# Patient Record
Sex: Male | Born: 1965 | Race: White | Hispanic: No | Marital: Married | Smoking: Former smoker
Health system: Southern US, Community
[De-identification: ages and names within clinical notes are randomized; demographics above are authoritative.]

## PROBLEM LIST (undated history)

## (undated) DIAGNOSIS — F32A Depression, unspecified: Secondary | ICD-10-CM

## (undated) DIAGNOSIS — I2510 Atherosclerotic heart disease of native coronary artery without angina pectoris: Secondary | ICD-10-CM

## (undated) DIAGNOSIS — F419 Anxiety disorder, unspecified: Secondary | ICD-10-CM

## (undated) DIAGNOSIS — I10 Essential (primary) hypertension: Secondary | ICD-10-CM

## (undated) HISTORY — DX: Anxiety disorder, unspecified: F41.9

## (undated) HISTORY — DX: Essential (primary) hypertension: I10

## (undated) HISTORY — DX: Depression, unspecified: F32.A

---

## 1987-07-16 HISTORY — PX: EYE SURGERY: SHX253

## 2005-05-03 ENCOUNTER — Emergency Department (HOSPITAL_COMMUNITY): Admission: EM | Admit: 2005-05-03 | Discharge: 2005-05-03 | Payer: Self-pay | Admitting: Emergency Medicine

## 2005-05-03 ENCOUNTER — Inpatient Hospital Stay (HOSPITAL_COMMUNITY): Admission: RE | Admit: 2005-05-03 | Discharge: 2005-05-07 | Payer: Self-pay | Admitting: Psychiatry

## 2005-05-04 ENCOUNTER — Ambulatory Visit: Payer: Self-pay | Admitting: Psychiatry

## 2006-08-26 ENCOUNTER — Emergency Department (HOSPITAL_COMMUNITY): Admission: EM | Admit: 2006-08-26 | Discharge: 2006-08-26 | Payer: Self-pay | Admitting: Emergency Medicine

## 2006-08-26 ENCOUNTER — Inpatient Hospital Stay (HOSPITAL_COMMUNITY): Admission: AD | Admit: 2006-08-26 | Discharge: 2006-08-30 | Payer: Self-pay | Admitting: Psychiatry

## 2006-08-26 ENCOUNTER — Ambulatory Visit: Payer: Self-pay | Admitting: Psychiatry

## 2006-09-14 ENCOUNTER — Emergency Department (HOSPITAL_COMMUNITY): Admission: EM | Admit: 2006-09-14 | Discharge: 2006-09-14 | Payer: Self-pay | Admitting: Emergency Medicine

## 2006-09-16 ENCOUNTER — Inpatient Hospital Stay (HOSPITAL_COMMUNITY): Admission: EM | Admit: 2006-09-16 | Discharge: 2006-09-22 | Payer: Self-pay | Admitting: Emergency Medicine

## 2006-09-19 ENCOUNTER — Encounter: Payer: Self-pay | Admitting: *Deleted

## 2008-10-18 IMAGING — CR DG CHEST 1V PORT
1 series · 1 of 1 positions shown · non-contrast
Comparison: 09/18/06 at [DATE] a.m. and 09/16/06.

CLINICAL DATA: Altered mental status with metabolic acidosis.
 AP PORTABLE SEMI-ERECT CHEST - 1 VIEW ? 09/19/06 AT [DATE] A.M.:

[view not recorded]
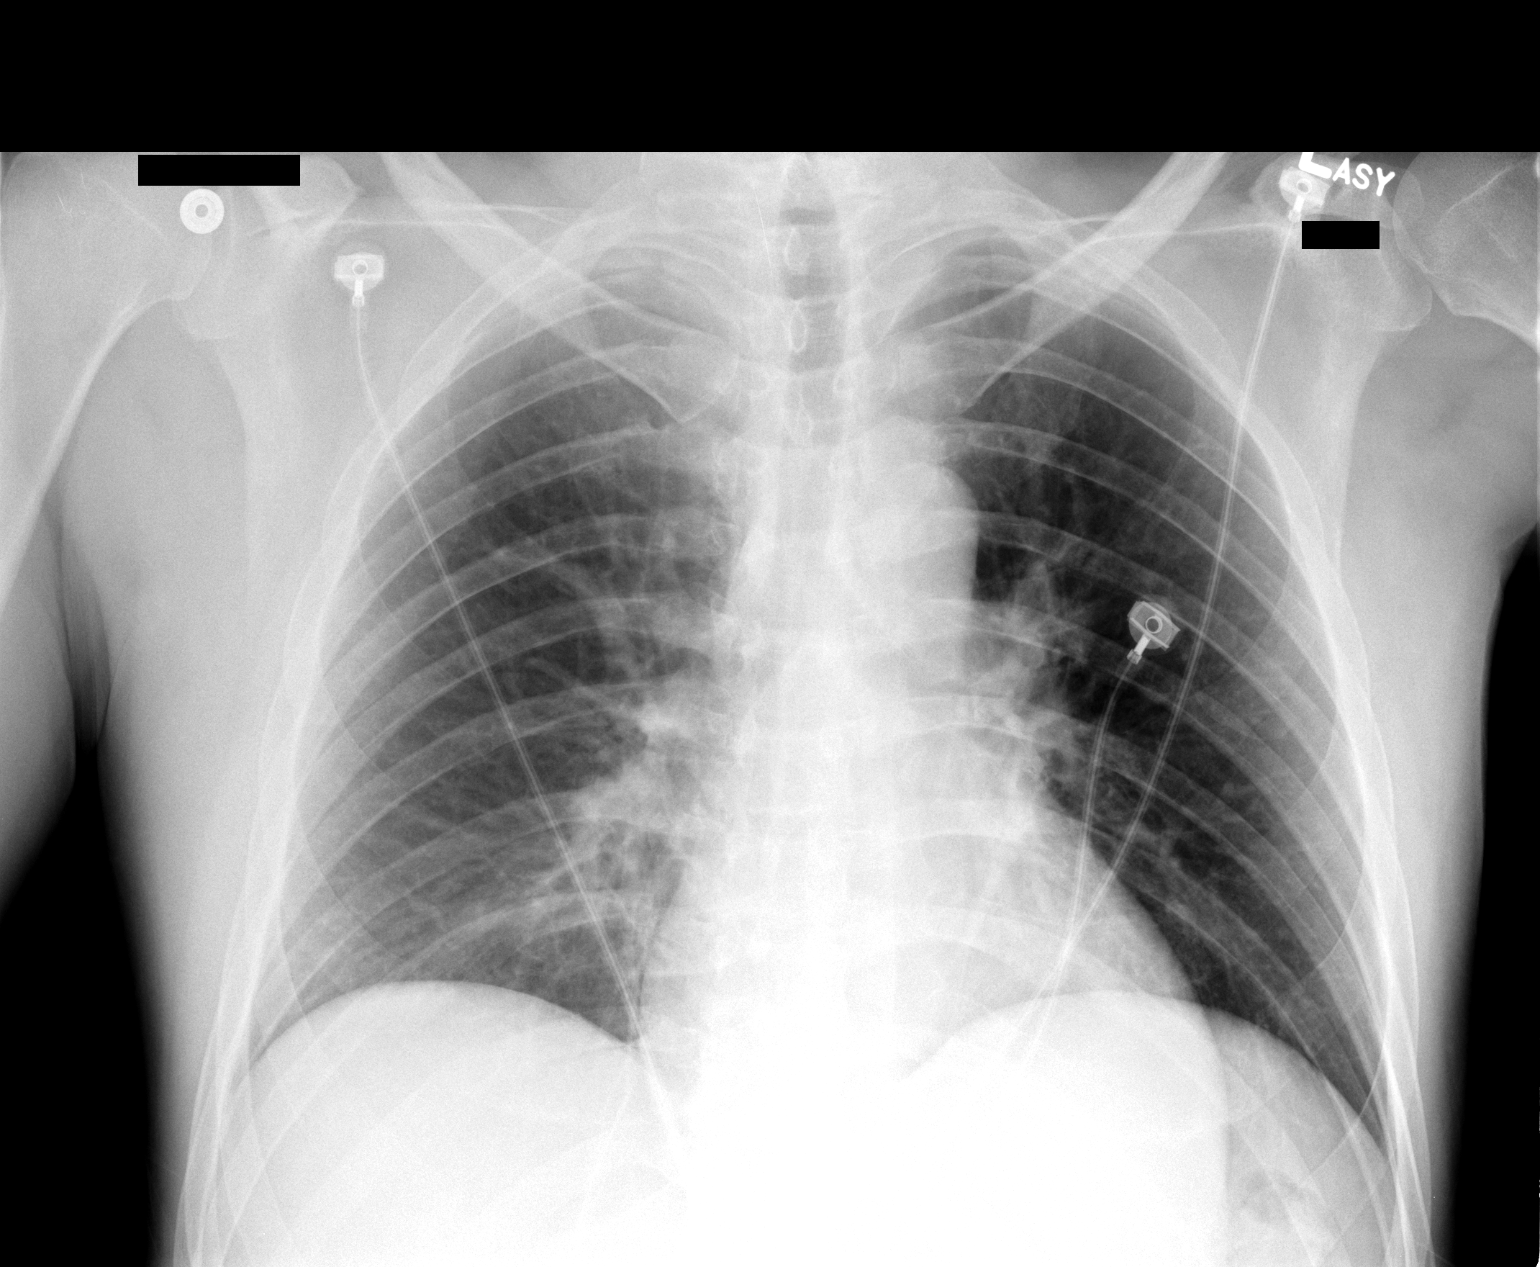

[1 of 1 positions shown; findings below may reference images not displayed]

FINDINGS: Central pulmonary vascular prominence.  No segmental region or consolidation or pulmonary edema.  The heart appears less prominent in size than on the 09/16/06 exam.
IMPRESSION: 1.  No infiltrate or congestive heart failure. 
 2.  Mild central pulmonary vascular prominence.

## 2021-12-01 ENCOUNTER — Ambulatory Visit (HOSPITAL_COMMUNITY)
Admission: EM | Admit: 2021-12-01 | Discharge: 2021-12-01 | Disposition: A | Discharge status: Home / Self Care | Payer: Self-pay | Attending: Physician Assistant | Admitting: Physician Assistant

## 2021-12-01 ENCOUNTER — Encounter (HOSPITAL_COMMUNITY): Payer: Self-pay

## 2021-12-01 DIAGNOSIS — I10 Essential (primary) hypertension: Secondary | ICD-10-CM

## 2021-12-01 MED ORDER — AMLODIPINE BESYLATE 5 MG PO TABS: 5.0000 mg | ORAL_TABLET | Freq: Every day | ORAL | 0 refills | Status: DC

## 2021-12-01 NOTE — Discharge Instructions (Signed)
Take medication as prescribed Continue to monitor blood pressure at home Keep appointment with primary care physician next week Return for evaluation if you develop headache, chest pain, blurred vision, shortness of breath.

## 2021-12-01 NOTE — ED Provider Notes (Signed)
[facility]    CSN: [id number] Arrival date & time: 12/01/21  1011      History   Chief Complaint Chief Complaint  Patient presents with   Hypertension    HPI Jeffrey Hodges is a 56 y.o. male.   Pt reports over the last three weeks he has had elevated blood pressure readings at home.  He was seen for a work related lower back injury, elevated blood pressure at that visit which was attributed to pain.  He reports his back pain has resolved at this time, but his blood pressure remains elevated.  He has been checking at home with readings as high as 200s/140s.  He denies chest pain, shortness of breath, lower extremity swelling, palpitations, blurred vision.  He reports intermittent headaches.     History reviewed. No pertinent past medical history.  There are no problems to display for this patient.   History reviewed. No pertinent surgical history.     Home Medications    Prior to Admission medications   Medication Sig Start Date End Date Taking? Authorizing Provider  amLODipine (NORVASC) 5 MG tablet Take 1 tablet (5 mg total) by mouth daily. 12/01/21 12/31/21 Yes Ward, Tylene Fantasia, PA-C    Family History History reviewed. No pertinent family history.  Social History     Allergies   Patient has no allergy information on record.   Review of Systems Review of Systems  Constitutional:  Negative for chills and fever.  HENT:  Negative for ear pain and sore throat.   Eyes:  Negative for pain and visual disturbance.  Respiratory:  Negative for cough and shortness of breath.   Cardiovascular:  Negative for chest pain and palpitations.  Gastrointestinal:  Negative for abdominal pain and vomiting.  Genitourinary:  Negative for dysuria and hematuria.  Musculoskeletal:  Negative for arthralgias and back pain.  Skin:  Negative for color change and rash.  Neurological:  Negative for seizures and syncope.  All other systems reviewed and are negative.   Physical  Exam Triage Vital Signs ED Triage Vitals  Enc Vitals Group     BP 12/01/21 1043 (!) 168/101     Pulse Rate 12/01/21 1043 67     Resp 12/01/21 1043 18     Temp 12/01/21 1043 98.3 F (36.8 C)     Temp Source 12/01/21 1043 Oral     SpO2 12/01/21 1043 96 %     Weight --      Height --      Head Circumference --      Peak Flow --      Pain Score 12/01/21 1044 0     Pain Loc --      Pain Edu? --      Excl. in GC? --    No data found.  Updated Vital Signs BP (!) 168/101 (BP Location: Left Arm)   Pulse 67   Temp 98.3 F (36.8 C) (Oral)   Resp 18   SpO2 96%   Visual Acuity Right Eye Distance:   Left Eye Distance:   Bilateral Distance:    Right Eye Near:   Left Eye Near:    Bilateral Near:     Physical Exam Vitals and nursing note reviewed.  Constitutional:      General: He is not in acute distress.    Appearance: He is well-developed.  HENT:     Head: Normocephalic and atraumatic.  Eyes:     Conjunctiva/sclera: Conjunctivae normal.  Cardiovascular:  Rate and Rhythm: Normal rate and regular rhythm.     Heart sounds: No murmur heard. Pulmonary:     Effort: Pulmonary effort is normal. No respiratory distress.     Breath sounds: Normal breath sounds.  Abdominal:     Palpations: Abdomen is soft.     Tenderness: There is no abdominal tenderness.  Musculoskeletal:        General: No swelling.     Cervical back: Neck supple.  Skin:    General: Skin is warm and dry.     Capillary Refill: Capillary refill takes less than 2 seconds.  Neurological:     Mental Status: He is alert.  Psychiatric:        Mood and Affect: Mood normal.     UC Treatments / Results  Labs (all labs ordered are listed, but only abnormal results are displayed) Labs Reviewed - No data to display  EKG   Radiology No results found.  Procedures Procedures (including critical care time)  Medications Ordered in UC Medications - No data to display  Initial Impression / Assessment  and Plan / UC Course  I have reviewed the triage vital signs and the nursing notes.  Pertinent labs & imaging results that were available during my care of the patient were reviewed by me and considered in my medical decision making (see chart for details).     Elevated blood pressure readings at home over the last few weeks.  Initially attributed to pain related to a work injury, this pain has now resolved and pressure remains high.  He has an appointment with PCP next week.  Will initiate HTN treatment with amlodipine.  Advised pt to keep his appointment next week with PCP. ED precautions given.  Final Clinical Impressions(s) / UC Diagnoses   Final diagnoses:  Hypertension, unspecified type     Discharge Instructions      Take medication as prescribed Continue to monitor blood pressure at home Keep appointment with primary care physician next week Return for evaluation if you develop headache, chest pain, blurred vision, shortness of breath.     ED Prescriptions     Medication Sig Dispense Auth. Provider   amLODipine (NORVASC) 5 MG tablet Take 1 tablet (5 mg total) by mouth daily. 30 tablet Ward, Tylene Fantasia, PA-C      PDMP not reviewed this encounter.   Ward, Tylene Fantasia, PA-C 12/01/21 1123

## 2021-12-01 NOTE — ED Triage Notes (Signed)
Onset x 3 weeks ago with high blood pressure readings. Pt injured his back and since then his blood pressure has been elevated. No SOB, headache or visual changes at this time.

## 2021-12-04 ENCOUNTER — Encounter: Payer: Self-pay | Admitting: Nurse Practitioner

## 2021-12-04 ENCOUNTER — Ambulatory Visit: Payer: BC Managed Care – PPO | Admitting: Nurse Practitioner

## 2021-12-04 VITALS — BP 130/72 | HR 72 | Temp 97.6°F | Resp 14 | Ht 72.0 in | Wt 218.1 lb

## 2021-12-04 DIAGNOSIS — F419 Anxiety disorder, unspecified: Secondary | ICD-10-CM

## 2021-12-04 DIAGNOSIS — Z0000 Encounter for general adult medical examination without abnormal findings: Secondary | ICD-10-CM

## 2021-12-04 DIAGNOSIS — Z125 Encounter for screening for malignant neoplasm of prostate: Secondary | ICD-10-CM

## 2021-12-04 DIAGNOSIS — I10 Essential (primary) hypertension: Secondary | ICD-10-CM | POA: Diagnosis not present

## 2021-12-04 DIAGNOSIS — F32A Depression, unspecified: Secondary | ICD-10-CM

## 2021-12-04 DIAGNOSIS — E663 Overweight: Secondary | ICD-10-CM | POA: Diagnosis not present

## 2021-12-04 DIAGNOSIS — Z1211 Encounter for screening for malignant neoplasm of colon: Secondary | ICD-10-CM

## 2021-12-04 LAB — CBC WITH DIFFERENTIAL/PLATELET
Basophils Absolute: 0 10*3/uL (ref 0.0–0.1)
Basophils Relative: 0.8 % (ref 0.0–3.0)
Eosinophils Absolute: 0.4 10*3/uL (ref 0.0–0.7)
Eosinophils Relative: 5.7 % — ABNORMAL HIGH (ref 0.0–5.0)
HCT: 47.9 % (ref 39.0–52.0)
Hemoglobin: 16.1 g/dL (ref 13.0–17.0)
Lymphocytes Relative: 16 % (ref 12.0–46.0)
Lymphs Abs: 1 10*3/uL (ref 0.7–4.0)
MCHC: 33.6 g/dL (ref 30.0–36.0)
MCV: 100.7 fl — ABNORMAL HIGH (ref 78.0–100.0)
Monocytes Absolute: 0.4 10*3/uL (ref 0.1–1.0)
Monocytes Relative: 6 % (ref 3.0–12.0)
Neutro Abs: 4.5 10*3/uL (ref 1.4–7.7)
Neutrophils Relative %: 71.5 % (ref 43.0–77.0)
Platelets: 200 10*3/uL (ref 150.0–400.0)
RBC: 4.76 Mil/uL (ref 4.22–5.81)
RDW: 14.8 % (ref 11.5–15.5)
WBC: 6.2 10*3/uL (ref 4.0–10.5)

## 2021-12-04 LAB — COMPREHENSIVE METABOLIC PANEL
ALT: 21 U/L (ref 0–53)
AST: 21 U/L (ref 0–37)
Albumin: 4.4 g/dL (ref 3.5–5.2)
Alkaline Phosphatase: 76 U/L (ref 39–117)
BUN: 14 mg/dL (ref 6–23)
CO2: 32 mEq/L (ref 19–32)
Calcium: 9.2 mg/dL (ref 8.4–10.5)
Chloride: 100 mEq/L (ref 96–112)
Creatinine, Ser: 1.06 mg/dL (ref 0.40–1.50)
GFR: 80.16 mL/min (ref >60.00)
Glucose, Bld: 98 mg/dL (ref 70–99)
Potassium: 4.4 mEq/L (ref 3.5–5.1)
Sodium: 138 mEq/L (ref 135–145)
Total Bilirubin: 0.6 mg/dL (ref 0.2–1.2)
Total Protein: 7 g/dL (ref 6.0–8.3)

## 2021-12-04 LAB — LIPID PANEL
Cholesterol: 251 mg/dL — ABNORMAL HIGH (ref 0–200)
HDL: 64.5 mg/dL (ref >39.00)
LDL Cholesterol: 168 mg/dL — ABNORMAL HIGH (ref 0–99)
NonHDL: 186.35
Total CHOL/HDL Ratio: 4
Triglycerides: 93 mg/dL (ref 0.0–149.0)
VLDL: 18.6 mg/dL (ref 0.0–40.0)

## 2021-12-04 LAB — PSA: PSA: 0.33 ng/mL (ref 0.10–4.00)

## 2021-12-04 LAB — HEMOGLOBIN A1C: Hgb A1c MFr Bld: 5.5 % (ref 4.6–6.5)

## 2021-12-04 LAB — TSH: TSH: 0.8 u[IU]/mL (ref 0.35–5.50)

## 2021-12-04 NOTE — Assessment & Plan Note (Signed)
Encouraged healthy lifestyle modifications.  Did discuss with patient recommendation of 30 minutes of exercise daily 5 times a week.

## 2021-12-04 NOTE — Assessment & Plan Note (Signed)
Currently managed by [facility].  Patient states that he therapy and medication management currently on escitalopram 20 mg and hydroxyzine 25 mg 3 times daily.  PHQ-9 and GAD-7 administered in office today.  Patient denies HI/SI/AVH.  Continue following with behavioral health provider as recommended continue taking medication as prescribed

## 2021-12-04 NOTE — Patient Instructions (Signed)
Nice to see you today I will be in touch with the lab results once I have them If you can check your blood pressure 3-4 times a week and send them to me via my chart if you can do that we can see you in a year. If not I need to see you in 6 months

## 2021-12-04 NOTE — Assessment & Plan Note (Signed)
Patient has the ability to check blood pressure at home.  He was checking it and noticed elevated blood pressures and was seen at urgent care was told amlodipine 5 mg daily.  Patient tolerating medication well blood pressure within normal limits.  Patient to check blood pressures 3-4 times weekly and send them to me via MyChart.  Patient can do this follow-up 1 year patient cannot continue to follow-up in 6 months to discuss this with patient in office

## 2021-12-04 NOTE — Assessment & Plan Note (Signed)
Discussed age-appropriate immunizations and screening exams.  Appropriate orders placed today in office

## 2021-12-04 NOTE — Progress Notes (Signed)
New Patient Office Visit  Subjective    Patient ID: Jeffrey Hodges, male    DOB: September 20, 1965  Age: 56 y.o. MRN: [id number]  CC:  Chief Complaint  Patient presents with   Establish Care    No previous PCP but has been seen behavioral health online provider through [facility]   Hypertension    Follow up, was started on medication by urgent care    HPI Jeffrey Hodges presents to establish care   HTN: tolerating the medication well. Has a cuff at home. States that he will check it  at home.  Headaches that he had elevated blood pressure readings at home went to urgent care with elevated blood pressure there.  They did start him on amlodipine 5 mg   Anxiety and depression: Currently managed through [facility]. He is on lexapro and hydroxyzine and sees them every three months. States that they do therapy and medication management.  for complete physical and follow up of chronic conditions.  Immunizations: -Tetanus: 2022 -Influenza: out of season -Covid-19: original 2 and one booster -Shingles: information  -Pneumonia: too young  -HPV: aged out  Diet: [place]. Eats one meal a day around 6pm. 1 soda and the rest water. Exercise: No regular exercise. Taking care of the kids  Eye exam: PRN needs annually now due to blood pressure Dental exam: Completes semi-annually   Colonoscopy: needs  location Dexa: NA PSA: Due  Lung Cancer Screening: NA. Patient has only done electronic cigarettes. Working on cutting down on the amount of nicotine that he is using   Sleep: 10pm 5am wake up. Feels rested some of the time. Told he snores  Outpatient Encounter Medications as of 12/04/2021  Medication Sig   amLODipine (NORVASC) 5 MG tablet Take 1 tablet (5 mg total) by mouth daily.   escitalopram (LEXAPRO) 20 MG tablet Take 20 mg by mouth daily.   hydrOXYzine (VISTARIL) 25 MG capsule Take 25 mg by mouth 3 (three) times daily as needed.   No facility-administered  encounter medications on file as of 12/04/2021.    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  Social History   Socioeconomic History   Marital status: Married    Spouse name: Not on file   Number of children: 4   Years of education: Not on file   Highest education level: Not on file  Occupational History   Not on file  Tobacco Use   Smoking status: Every Day    Types: E-cigarettes   Smokeless tobacco: Never  Vaping Use   Vaping Use: Every day   Substances: Nicotine, Flavoring  Substance and Sexual Activity   Alcohol use: Not Currently   Drug use: Yes    Types: Marijuana    Comment: once in a while. Last time was 3 weeks ago   Sexual activity: Yes  Other Topics Concern   Not on file  Social History Narrative   Fulltime: [employer]: like to play guitars   Social Determinants of Health   Financial Resource Strain: Not on file  Food Insecurity: Not on file  Transportation Needs: Not on file  Physical Activity: Not on file  Stress: Not on file  Social Connections: Not on file  Intimate Partner Violence: Not on file    Review of Systems  Constitutional:  Positive for malaise/fatigue. Negative for chills and fever.  Respiratory:  Negative for cough and shortness of breath.   Cardiovascular:  Negative for chest pain and leg swelling.  Gastrointestinal:  Negative for constipation, nausea and vomiting.       BM daily  Genitourinary:  Negative for dysuria and hematuria.       Nocturia intermittently  Neurological:  Negative for dizziness, tingling, weakness and headaches.  Psychiatric/Behavioral:  Negative for hallucinations and suicidal ideas.        Objective    BP 130/72   Pulse 72   Temp 97.6 F (36.4 C)   Resp 14   Ht 6' (1.829 m)   Wt 218 lb 2  oz (98.9 kg)   SpO2 97%   BMI 29.58 kg/m   Physical Exam Vitals and nursing note reviewed. Exam conducted with a chaperone present [facility] Cook, RMA).  Constitutional:      Appearance: Normal appearance.  HENT:     Right Ear: Tympanic membrane, ear canal and external ear normal.     Left Ear: Tympanic membrane and ear canal normal.     Mouth/Throat:     Mouth: Mucous membranes are moist.     Pharynx: Oropharynx is clear.  Eyes:     Extraocular Movements: Extraocular movements intact.     Pupils: Pupils are equal, round, and reactive to light.     Comments: Has a cloudy look to left lense. 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  Cardiovascular:     Rate and Rhythm: Normal rate and regular rhythm.     Pulses: Normal pulses.     Heart sounds: Normal heart sounds.  Pulmonary:     Effort: Pulmonary effort is normal.     Breath sounds: Normal breath sounds.  Abdominal:     General: Bowel sounds are normal. There is no distension.     Palpations: Abdomen is soft. There is no mass.     Tenderness: There is no abdominal tenderness.     Hernia: No hernia is present. There is no hernia in the left inguinal area or right inguinal area.  Genitourinary:    Penis: Normal and circumcised.      Testes: Normal.     Epididymis:     Right: Normal.     Left: Normal.  Musculoskeletal:     Right lower leg: No edema.     Left lower leg: No edema.  Lymphadenopathy:     Cervical: No cervical adenopathy.     Lower Body: No right inguinal adenopathy. No left inguinal adenopathy.  Skin:    General: Skin is warm.  Neurological:     General: No focal deficit present.     Mental Status: He is alert.     Deep Tendon Reflexes:     Reflex Scores:      Bicep reflexes are 2+ on the right side and 2+ on the left side.      Patellar reflexes are 2+ on the right side and 2+ on the left side.    Comments: Bilateral  upper and lower extremity strength 5/5  Psychiatric:        Mood and Affect: Mood normal.        Behavior: Behavior normal.  Thought Content: Thought content normal.        Judgment: Judgment normal.        Assessment & Plan:   Problem List Items Addressed This Visit       Cardiovascular and Mediastinum   Primary hypertension    Patient has the ability to check blood pressure at home.  He was checking it and noticed elevated blood pressures and was seen at urgent care was told amlodipine 5 mg daily.  Patient tolerating medication well blood pressure within normal limits.  Patient to check blood pressures 3-4 times weekly and send them to me via MyChart.  Patient can do this follow-up 1 year patient cannot continue to follow-up in 6 months to discuss this with patient in office       Relevant Orders   Comprehensive metabolic panel   CBC with Differential/Platelet     Other   Preventative health care - Primary    Discussed age-appropriate immunizations and screening exams.  Appropriate orders placed today in office       Relevant Orders   Comprehensive metabolic panel   CBC with Differential/Platelet   Hemoglobin A1c   TSH   Lipid panel   Anxiety and depression    Currently managed by [facility].  Patient states that he therapy and medication management currently on escitalopram 20 mg and hydroxyzine 25 mg 3 times daily.  PHQ-9 and GAD-7 administered in office today.  Patient denies HI/SI/AVH.  Continue following with behavioral health provider as recommended continue taking medication as prescribed       Relevant Medications   escitalopram (LEXAPRO) 20 MG tablet   hydrOXYzine (VISTARIL) 25 MG capsule   Other Relevant Orders   TSH   Overweight (BMI 25.0-29.9)    Encouraged healthy lifestyle modifications.  Did discuss with patient recommendation of 30 minutes of exercise daily 5 times a week.       Relevant Orders   Hemoglobin A1c    Lipid panel   Other Visit Diagnoses     Screening for prostate cancer       Relevant Orders   PSA   Screening for colon cancer       Relevant Orders   Ambulatory referral to Gastroenterology       Return in about 1 year (around 12/05/2022) for cpe and labs.   Romilda Garret, NP

## 2021-12-06 ENCOUNTER — Other Ambulatory Visit: Payer: Self-pay | Admitting: Nurse Practitioner

## 2021-12-06 DIAGNOSIS — E7800 Pure hypercholesterolemia, unspecified: Secondary | ICD-10-CM

## 2021-12-31 ENCOUNTER — Encounter: Payer: Self-pay | Admitting: Nurse Practitioner

## 2021-12-31 DIAGNOSIS — I10 Essential (primary) hypertension: Secondary | ICD-10-CM

## 2022-01-01 MED ORDER — AMLODIPINE BESYLATE 5 MG PO TABS: 5.0000 mg | ORAL_TABLET | Freq: Every day | ORAL | 1 refills | Status: DC

## 2022-03-19 ENCOUNTER — Encounter: Payer: Self-pay | Admitting: Nurse Practitioner

## 2022-03-19 DIAGNOSIS — F411 Generalized anxiety disorder: Secondary | ICD-10-CM

## 2022-04-12 ENCOUNTER — Ambulatory Visit: Payer: BC Managed Care – PPO | Admitting: Nurse Practitioner

## 2022-04-12 VITALS — BP 130/78 | HR 77 | Temp 97.0°F | Resp 12 | Ht 72.0 in | Wt 226.2 lb

## 2022-04-12 DIAGNOSIS — F410 Panic disorder [episodic paroxysmal anxiety] without agoraphobia: Secondary | ICD-10-CM | POA: Insufficient documentation

## 2022-04-12 DIAGNOSIS — F411 Generalized anxiety disorder: Secondary | ICD-10-CM | POA: Diagnosis not present

## 2022-04-12 DIAGNOSIS — F321 Major depressive disorder, single episode, moderate: Secondary | ICD-10-CM | POA: Insufficient documentation

## 2022-04-12 MED ORDER — FLUOXETINE HCL 10 MG PO TABS: ORAL_TABLET | ORAL | 0 refills | Status: DC

## 2022-04-12 NOTE — Assessment & Plan Note (Addendum)
We will start fluoxetine 10 mg for 2 weeks then titrate to fluoxetine 20 mg daily.  He will reach out to the office if not having some improvement in next 3 weeks and we can consider adding on BuSpar 5 mg twice daily at that point.  Ambulatory referral to psychology

## 2022-04-12 NOTE — Patient Instructions (Signed)
Nice to see you today Give the medication approx 3 weeks, if you feel in the same space and feel like you need something more we can add a medication call buspar that is just for anxiety  I want to see you in office in 6 weeks sooner if you need me

## 2022-04-12 NOTE — Progress Notes (Signed)
Established Patient Office Visit  Subjective   Patient ID: Jeffrey Hodges, male    DOB: 11/18/1965  Age: 56 y.o. MRN: [id number]  Chief Complaint  Patient presents with   Anxiety    Not controlled, would like to discuss medication change. Having panic attacks 2 times a week, interfering with his job and at home.      Anxiety: states that he has been having increased anxiety. States that he has lost interest in the things that he use to. States he has been feeling poor for over a year.  Was on lexapro.  Patient states has been out of medication for approximately 1 month.  He was getting it from the [facility]  Per patient report he has not tried any other medications on a daily basis.  He has a prescription for hydroxyzine that he feels is ineffective  Having trouble sleeping States that he is having panic attacks approx 2  times a weeks. States mostly when he is at work and will last 30-45 mins.     Review of Systems  Constitutional:  Negative for chills and fever.  Respiratory:  Negative for shortness of breath.   Cardiovascular:  Negative for chest pain.  Psychiatric/Behavioral:  Positive for depression. Negative for hallucinations and suicidal ideas. The patient is nervous/anxious and has insomnia.       Objective:     BP 130/78   Pulse 77   Temp (!) 97 F (36.1 C) (Temporal)   Resp 12   Ht 6' (1.829 m)   Wt 226 lb 4 oz (102.6 kg)   SpO2 96%   BMI 30.69 kg/m  BP Readings from Last 3 Encounters:  04/12/22 130/78  12/04/21 130/72  12/01/21 (!) 168/101   Wt Readings from Last 3 Encounters:  04/12/22 226 lb 4 oz (102.6 kg)  12/04/21 218 lb 2 oz (98.9 kg)      Physical Exam Vitals and nursing note reviewed.  Constitutional:      Appearance: Normal appearance.  Cardiovascular:     Rate and Rhythm: Normal rate and regular rhythm.     Heart sounds: Normal heart sounds.  Pulmonary:     Effort: Pulmonary effort is normal.     Breath sounds: Normal  breath sounds.  Neurological:     Mental Status: He is alert.  Psychiatric:        Attention and Perception: Attention normal.        Mood and Affect: Mood is depressed.        Speech: Speech normal.        Behavior: Behavior normal.        Cognition and Memory: Cognition normal.        Judgment: Judgment normal.      No results found for any visits on 04/12/22.    The 10-year ASCVD risk score (Arnett DK, et al., 2019) is: 11.5%    Assessment & Plan:   Problem List Items Addressed This Visit       Other   GAD (generalized anxiety disorder)    We will start fluoxetine 10 mg for 2 weeks then titrate to fluoxetine 20 mg daily.  He will reach out to the office if not having some improvement in next 3 weeks and we can consider adding on BuSpar 5 mg twice daily at that point.  Ambulatory referral to psychology      Relevant Medications   FLUoxetine (PROZAC) 10 MG tablet   Other Relevant Orders  Ambulatory referral to Psychology   Depression, major, single episode, moderate ([place]) - Primary    Patient has been off escitalopram for 1 month.  We will start patient on fluoxetine 10 mg for 2 weeks then titrate up to 20 mg for 2 weeks.  Did discuss the common side effects.  Patient is denying HI/SI/AVH.  He will reach out and approximately 3 weeks if he is not feeling great improvement and we will consider adding on BuSpar for his panic/anxiety.  We will discontinue his Lexapro and hydroxyzine  Ambulatory referral to psychology.      Relevant Medications   FLUoxetine (PROZAC) 10 MG tablet   Other Relevant Orders   Ambulatory referral to Psychology    Return in about 6 weeks (around 05/24/2022) for MDD and GAD recheck.    Romilda Garret, NP

## 2022-04-12 NOTE — Assessment & Plan Note (Addendum)
Patient has been off escitalopram for 1 month.  We will start patient on fluoxetine 10 mg for 2 weeks then titrate up to 20 mg for 2 weeks.  Did discuss the common side effects.  Patient is denying HI/SI/AVH.  He will reach out and approximately 3 weeks if he is not feeling great improvement and we will consider adding on BuSpar for his panic/anxiety.  We will discontinue his Lexapro and hydroxyzine  Ambulatory referral to psychology.

## 2022-04-13 ENCOUNTER — Encounter: Payer: Self-pay | Admitting: Nurse Practitioner

## 2022-04-13 ENCOUNTER — Other Ambulatory Visit: Payer: Self-pay | Admitting: Nurse Practitioner

## 2022-04-13 DIAGNOSIS — I10 Essential (primary) hypertension: Secondary | ICD-10-CM

## 2022-04-24 ENCOUNTER — Telehealth: Payer: Self-pay | Admitting: Nurse Practitioner

## 2022-04-24 NOTE — Telephone Encounter (Signed)
Patient called in stating that his blood pressure has been reading high and he has been feeling nauseous. His readings have been 150/103 this morning and 159/113 this afternoon. Sent over to access nurse.

## 2022-04-25 NOTE — Telephone Encounter (Signed)
Patient advised. Patient states he is not able to get off work and his work hours are 10 to sometimes 12 hours a day. Discussed option of ER if he develops things like chest pain, nausea, vomiting, dizziness or worse swelling of his legs or if swelling does not improve over night with leg elevation. Patient said he would go and be seen. Discussed in depth importance of getting checked for this. Patient will follow up as he can.

## 2022-04-25 NOTE — Telephone Encounter (Signed)
Noted and appreciate the phone call! 

## 2022-04-25 NOTE — Telephone Encounter (Signed)
[name] - Client TELEPHONE ADVICE RECORD [facility] Patient Name: [name] Gender: Male DOB: Sep 23, 1965 Age: 56 Y 10 M 1 D Return Phone Number: [phone] (Primary), [phone] (Secondary) Address: [place]  [address] Client [facility] - Client Client Site [place] - Day Provider Romilda Garret- NP Contact Type Call Who Is Calling Patient / Member / Family / Caregiver Call Type Triage / Clinical Relationship To Patient Self Return Phone Number [phone] (Primary) Chief Complaint Blood Pressure High Reason for Call Symptomatic / Request for [place] states she has a patient on the line feeling nauseas with high blood pressure (150/103, 159/115). Patient name is Jeffrey Hodges DOB [birth year]. Caller states he is taking Prozak. Translation No Disp. Time Eilene Ghazi Time) Disposition Final User 04/24/2022 4:33:40 PM Attempt made - no message left Bringas, RN, Danica 04/24/2022 4:50:12 PM Attempt made - no message left Bringas, RN, Danica 04/24/2022 5:13:07 PM Attempt made - no message left Bringas, RN, Danica 04/24/2022 5:13:31 PM FINAL ATTEMPT MADE - no message left Yes Bringas, RN, Danica Final Disposition 04/24/2022 5:13:31 PM FINAL ATTEMPT MADE - no message left

## 2022-04-25 NOTE — Telephone Encounter (Signed)
Agree with disposition. If his blood pressure is truly elevated and having symptoms he needs to be seen. Or be evaluated at [facility]

## 2022-04-25 NOTE — Telephone Encounter (Signed)
Called patient states he has not taken blood pressure today but he is still not feeling well. He sates he has had headache for last two days. Patient has had some palpitations that come and go. Has been having swelling in b/l legs that started about a month ago. Patient states most recent readings from last night were in low 120/70 last night. He is not able to check readings today while on the phone. Denies any chest pain, nausea, vomiting, no sob. Tried to schedule appointment with patient but he is not able to come in until app one he has scheduled in November. I stressed to patient importance of getting evaluated with elevated readings and swelling. He has declined. Reviewed ED/UC precautions and agreed if any changes would be seen in ED.

## 2022-05-03 MED ORDER — BUSPIRONE HCL 5 MG PO TABS: 5.0000 mg | ORAL_TABLET | Freq: Two times a day (BID) | ORAL | 0 refills | Status: DC

## 2022-05-10 ENCOUNTER — Other Ambulatory Visit: Payer: Self-pay | Admitting: Nurse Practitioner

## 2022-05-10 DIAGNOSIS — F321 Major depressive disorder, single episode, moderate: Secondary | ICD-10-CM

## 2022-05-10 DIAGNOSIS — F411 Generalized anxiety disorder: Secondary | ICD-10-CM

## 2022-05-10 MED ORDER — FLUOXETINE HCL 20 MG PO CAPS: 20.0000 mg | ORAL_CAPSULE | Freq: Every day | ORAL | 0 refills | Status: DC

## 2022-05-10 NOTE — Telephone Encounter (Signed)
Left message and sent mychart message to the patient 

## 2022-05-13 NOTE — Telephone Encounter (Signed)
Patient viewed mychart message per chart.

## 2022-05-24 ENCOUNTER — Ambulatory Visit: Payer: BC Managed Care – PPO | Admitting: Nurse Practitioner

## 2022-05-24 VITALS — BP 128/83 | HR 63 | Temp 96.9°F | Resp 14 | Ht 72.0 in | Wt 229.0 lb

## 2022-05-24 DIAGNOSIS — F411 Generalized anxiety disorder: Secondary | ICD-10-CM

## 2022-05-24 DIAGNOSIS — F321 Major depressive disorder, single episode, moderate: Secondary | ICD-10-CM | POA: Diagnosis not present

## 2022-05-24 MED ORDER — FLUOXETINE HCL 40 MG PO CAPS: 40.0000 mg | ORAL_CAPSULE | Freq: Every day | ORAL | 0 refills | Status: DC

## 2022-05-24 NOTE — Progress Notes (Signed)
Established Patient Office Visit  Subjective   Patient ID: AUTUMN GUNN, male    DOB: Jan 25, 1966  Age: 56 y.o. MRN: [id number]  Chief Complaint  Patient presents with   depression/anxiety follow up    Better but still having panic attacks about 2 times a week, not sure the cause.    HPI  Depression: States that he feels like the prozac is helping but he is still having some panic attacks twice a week. States sometimes first thing in th emorning and through out the day. States they last approx 45 mins at at time. States that his wife has been dx with MS and going through medical stuff. States that he thinks about that all the time  States that he will have restless sleep or not able to go to sleep. States that he will get a song in his head and will play over and over  Not currently doing therapy. Not opposed to therapy          05/24/2022    9:34 AM 04/12/2022   12:02 PM 12/04/2021   10:51 AM  PHQ9 SCORE ONLY  PHQ-9 Total Score 10 17 7        04/12/2022   12:02 PM 12/04/2021   10:52 AM  GAD 7 : Generalized Anxiety Score  Nervous, Anxious, on Edge 3 1  Control/stop worrying 3 1  Worry too much - different things 3 1  Trouble relaxing 3 1  Restless 2 1  Easily annoyed or irritable 0 1  Afraid - awful might happen 3 1  Total GAD 7 Score 17 7  Anxiety Difficulty Very difficult Somewhat difficult       Review of Systems  Constitutional:  Negative for chills and fever.  Respiratory:  Negative for shortness of breath.   Cardiovascular:  Negative for chest pain.  Neurological:  Negative for headaches.  Psychiatric/Behavioral:  Negative for hallucinations and suicidal ideas. The patient is nervous/anxious and has insomnia.       Objective:     BP 128/83   Pulse 63   Temp (!) 96.9 F (36.1 C) (Temporal)   Resp 14   Ht 6' (1.829 m)   Wt 229 lb (103.9 kg)   SpO2 96%   BMI 31.06 kg/m    Physical Exam Vitals and nursing note reviewed.  Constitutional:       Appearance: Normal appearance.  Cardiovascular:     Rate and Rhythm: Normal rate and regular rhythm.     Heart sounds: Normal heart sounds.  Pulmonary:     Breath sounds: Normal breath sounds.  Neurological:     Mental Status: He is alert.  Psychiatric:        Mood and Affect: Mood normal.        Behavior: Behavior normal.        Thought Content: Thought content normal.        Judgment: Judgment normal.      No results found for any visits on 05/24/22.    The 10-year ASCVD risk score (Arnett DK, et al., 2019) is: 11.2%    Assessment & Plan:   Problem List Items Addressed This Visit       Other   GAD (generalized anxiety disorder) - Primary    Patient currently maintained on BuSpar 5 mg twice daily and fluoxetine 20 mg daily.  Has had a decrease in symptoms but still experiencing some breakthrough panic about 2 times a week.  Will increase fluoxetine from  20 mg to 40 mg.  Follow-up in 2 months, sooner if needed      Relevant Medications   FLUoxetine (PROZAC) 40 MG capsule   Depression, major, single episode, moderate (HCC)    PHQ-9 and GAD-7 administered in office.  Patient has had a decrease in his symptoms but still experiencing some anxiety symptoms.  We will increase fluoxetine from 20 mg to 40 mg.  Continue BuSpar 5 mg twice daily if no improvement or still having breakthrough symptoms at next office visit we will encourage patient to seek therapy      Relevant Medications   FLUoxetine (PROZAC) 40 MG capsule    Return in about 2 months (around 07/24/2022) for MDD/GAD recheck .    Audria Nine, NP

## 2022-05-24 NOTE — Assessment & Plan Note (Signed)
Patient currently maintained on BuSpar 5 mg twice daily and fluoxetine 20 mg daily.  Has had a decrease in symptoms but still experiencing some breakthrough panic about 2 times a week.  Will increase fluoxetine from 20 mg to 40 mg.  Follow-up in 2 months, sooner if needed

## 2022-05-24 NOTE — Patient Instructions (Signed)
Nice to see you today I want to see you in 2 months, sooner if you need me I changed the medication to 40mg . You can take 2 of the 20mg  fluoxetine's until you finish the medication that you have

## 2022-05-24 NOTE — Assessment & Plan Note (Signed)
PHQ-9 and GAD-7 administered in office.  Patient has had a decrease in his symptoms but still experiencing some anxiety symptoms.  We will increase fluoxetine from 20 mg to 40 mg.  Continue BuSpar 5 mg twice daily if no improvement or still having breakthrough symptoms at next office visit we will encourage patient to seek therapy

## 2022-06-09 ENCOUNTER — Other Ambulatory Visit: Payer: Self-pay | Admitting: Nurse Practitioner

## 2022-06-09 DIAGNOSIS — F411 Generalized anxiety disorder: Secondary | ICD-10-CM

## 2022-06-10 ENCOUNTER — Ambulatory Visit: Payer: BC Managed Care – PPO | Admitting: Nurse Practitioner

## 2022-07-23 ENCOUNTER — Encounter: Payer: Self-pay | Admitting: Nurse Practitioner

## 2022-07-24 ENCOUNTER — Ambulatory Visit: Payer: BC Managed Care – PPO | Admitting: Nurse Practitioner

## 2022-08-01 ENCOUNTER — Ambulatory Visit: Payer: BC Managed Care – PPO | Admitting: Internal Medicine

## 2022-08-01 ENCOUNTER — Encounter: Payer: Self-pay | Admitting: Nurse Practitioner

## 2022-08-01 ENCOUNTER — Encounter: Payer: Self-pay | Admitting: Internal Medicine

## 2022-08-01 VITALS — BP 124/86 | HR 72 | Temp 97.7°F | Ht 72.0 in | Wt 229.0 lb

## 2022-08-01 DIAGNOSIS — F411 Generalized anxiety disorder: Secondary | ICD-10-CM | POA: Diagnosis not present

## 2022-08-01 MED ORDER — BUSPIRONE HCL 10 MG PO TABS: 10.0000 mg | ORAL_TABLET | Freq: Two times a day (BID) | ORAL | 1 refills | Status: DC

## 2022-08-01 MED ORDER — ALPRAZOLAM 0.25 MG PO TABS: 0.2500 mg | ORAL_TABLET | Freq: Two times a day (BID) | ORAL | 0 refills | Status: DC | PRN

## 2022-08-01 NOTE — Assessment & Plan Note (Signed)
With increased panic Will go ahead with psychology referral Check labs--just in case Increase buspirone to 10 bid  Will give a few xanax for prn for the panic

## 2022-08-01 NOTE — Telephone Encounter (Signed)
I spoke with pt who is presently at work; pt said he is having panic attacks more often; pt had panic attack on 07/26/22,07/27/22. And 08/01/22 about 8:30 AM that lasted about 45 mins and another panic attack this morning starting about 20 mins ago. Pt said he cannot think straight, pt feels like something bad is going to happen and pt said " I feel like I could drop over dead any minute." No SI/HI. Pt said he does not have any idea what is triggering panic attacks. On 08/01/22 at 6:30 AM pt took Buspar 5 mg and prozac 40 mg.  Pt is not having any CP,SOB,H/A or dizziness. Pt last saw Romilda Garret NP on 05/24/22 and had FU appt with [name] on 2022/08/12 but due to death in family pt cancelled the 2022-08-12 appt. Pt said could not be at office in 15 mins.Pts wife has already scheduled appt with Dr Silvio Pate today at [facility]. At first pt was not sure if he could wait until 2 PM and was advised if could not wait for appt today at 2pm pt should go to ED for eval. I spoke with Romilda Garret NP and he said appt at office OK unless pt had CP,SOB,H/A or could not wait for appt at 2 PM and in that case pt should go to ED. Pt voiced understanding and thinks he can wait until 2 pm for appt if not he will go to ED. UC & ED precautions given and pt voiced understanding. Pt will plan on being at [facility] 1:40-1:45 today sending note as FYI to Romilda Garret NP and Dr Silvio Pate.

## 2022-08-01 NOTE — Patient Instructions (Signed)
How to help anxiety - without medication.   1) Regular Exercise - walking, jogging, cycling, dancing, strength training   2)  Begin a Mindfulness/Meditation practice -- this can take a little as 3 minutes and is helpful for all kinds of mood issues  -- You can find resources in books  -- Or you can download apps like  ---- Headspace App (which currently has free content called "Weathering the Storm")  ---- Calm (which has a few free options)  ---- Insignt Timer  ---- Stop, Breathe & Think   # With each of these Apps - you should decline the "start free trial" offer and as you search through the App should be able to access some of their free content. You can also chose to pay for the content if you find one that works well for you.   # Many of them also offer sleep specific content which may help with insomnia   3) Healthy Diet  -- Avoid or decrease Caffeine  -- Avoid or decrease Alcohol  -- Drink plenty of water, have a balanced diet  -- Avoid cigarettes and marijuana (as well as other recreational drugs)   4) Consider contacting a professional therapist   

## 2022-08-01 NOTE — Progress Notes (Signed)
Subjective:    Patient ID: Jeffrey Hodges, male    DOB: 1965/12/18, 57 y.o.   MRN: [id number]  HPI Here due to worsened panic attacks  Known high blood pressure---"but my anxiety seems to be getting worse" 2 panic attacks today 1 each five and six days ago  "They feel really uncomfortable---like something bad is going to happen" Paces, palms get sweaty Sporadic palpitations--- but not every time Has trouble "thinking straight" Wife with new MS--has started Rx (1 year ago)  Did have fluoxetine increased in November---hasn't noticed a difference Not really overtly depressed but is anhedonic--doesn't have the urge to play guitars he has, etc Most of symptoms go back to marriage break up 2003---panic Rare until recently  No thoughts of dying or suicide  Does vape May make the anxiety worse No other drugs or alcohol  Current Outpatient Medications on File Prior to Visit  Medication Sig Dispense Refill   amLODipine (NORVASC) 5 MG tablet TAKE 1 TABLET (5 MG TOTAL) BY MOUTH DAILY. 90 tablet 1   busPIRone (BUSPAR) 5 MG tablet TAKE 1 TABLET BY MOUTH TWICE A DAY 60 tablet 2   FLUoxetine (PROZAC) 40 MG capsule Take 1 capsule (40 mg total) by mouth daily. 90 capsule 0   No current facility-administered medications on file prior to visit.    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  Social History   Socioeconomic History   Marital status: Married    Spouse name: Not on file   Number of children: 4   Years of education: Not on file   Highest education level: Not on file  Occupational History   Not on file  Tobacco Use   Smoking status: Every Day    Types: E-cigarettes    Passive exposure: Never   Smokeless tobacco: Never  Vaping Use   Vaping Use: Every  day   Substances: Nicotine, Flavoring  Substance and Sexual Activity   Alcohol use: Not Currently   Drug use: Yes    Types: Marijuana    Comment: once in a while. 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 This isn't new Weight is up some No exercise other than work    Objective:   Physical Exam Constitutional:      Appearance: Normal appearance.  Cardiovascular:     Rate and Rhythm: Normal rate and regular rhythm.     Heart sounds: No murmur heard.    No gallop.  Pulmonary:     Effort: Pulmonary effort is normal.     Breath sounds: Normal breath sounds. No wheezing or rales.  Musculoskeletal:  Cervical back: Neck supple.     Right lower leg: No edema.     Left lower leg: No edema.  Lymphadenopathy:     Cervical: No cervical adenopathy.  Neurological:     Mental Status: He is alert.  Psychiatric:     Comments: No overt depression            Assessment & Plan:

## 2022-08-01 NOTE — Telephone Encounter (Signed)
Noted. Appreciate Dr Alla German evaluation

## 2022-08-02 LAB — COMPREHENSIVE METABOLIC PANEL
ALT: 62 U/L — ABNORMAL HIGH (ref 0–53)
AST: 55 U/L — ABNORMAL HIGH (ref 0–37)
Albumin: 4.4 g/dL (ref 3.5–5.2)
Alkaline Phosphatase: 93 U/L (ref 39–117)
BUN: 13 mg/dL (ref 6–23)
CO2: 27 mEq/L (ref 19–32)
Calcium: 9.2 mg/dL (ref 8.4–10.5)
Chloride: 98 mEq/L (ref 96–112)
Creatinine, Ser: 0.98 mg/dL (ref 0.40–1.50)
GFR: 87.67 mL/min (ref >60.00)
Glucose, Bld: 93 mg/dL (ref 70–99)
Potassium: 4.1 mEq/L (ref 3.5–5.1)
Sodium: 137 mEq/L (ref 135–145)
Total Bilirubin: 1 mg/dL (ref 0.2–1.2)
Total Protein: 7 g/dL (ref 6.0–8.3)

## 2022-08-02 LAB — CBC
HCT: 46.9 % (ref 39.0–52.0)
Hemoglobin: 15.9 g/dL (ref 13.0–17.0)
MCHC: 33.9 g/dL (ref 30.0–36.0)
MCV: 107.8 fl — ABNORMAL HIGH (ref 78.0–100.0)
Platelets: 186 10*3/uL (ref 150.0–400.0)
RBC: 4.35 Mil/uL (ref 4.22–5.81)
RDW: 16 % — ABNORMAL HIGH (ref 11.5–15.5)
WBC: 5.3 10*3/uL (ref 4.0–10.5)

## 2022-08-02 LAB — TSH: TSH: 0.75 u[IU]/mL (ref 0.35–5.50)

## 2022-08-13 ENCOUNTER — Telehealth: Payer: Self-pay | Admitting: Nurse Practitioner

## 2022-08-13 ENCOUNTER — Encounter: Payer: Self-pay | Admitting: Nurse Practitioner

## 2022-08-13 MED ORDER — LORAZEPAM 0.5 MG PO TABS: 0.2500 mg | ORAL_TABLET | Freq: Two times a day (BID) | ORAL | 0 refills | Status: DC | PRN

## 2022-08-13 NOTE — Telephone Encounter (Signed)
Patient was seen by Dr Silvio Pate on 08/01/22 and prescribed xanax,and it is making him have an upset stomach,he would like to know is there anything else that he can be switched to?

## 2022-08-13 NOTE — Telephone Encounter (Signed)
Please triage

## 2022-08-13 NOTE — Telephone Encounter (Signed)
There is a MyChart message from the patient inquiring about the medications. I will copy this info from this phone message to that MyChart message so everything can be in one place.

## 2022-08-13 NOTE — Telephone Encounter (Signed)
Patient called in returning a call. He stated he was talking to someone and the phone got disconnected.

## 2022-08-13 NOTE — Telephone Encounter (Signed)
Let him know I sent some lorazepam instead. It should be only if he gets a full blown panic attack. He needs to review all this with [name] at his upcoming appt

## 2022-08-13 NOTE — Telephone Encounter (Addendum)
August 13, 2022 Me     08/13/22  2:32 PM Note There is a MyChart message from the patient inquiring about the medications. I will copy this info from this phone message to that MyChart message so everything can be in one place.         08/13/22  1:08 PM Venia Carbon, MD routed this conversation to Michela Pitcher, NP       08/13/22  1:08 PM Venia Carbon, MD routed this conversation to Cameron Sprang, MD     08/13/22  1:08 PM Note Let him know I sent some lorazepam instead. It should be only if he gets a full blown panic attack. He needs to review all this with [name] at his upcoming appt           08/13/22 12:49 PM Laws, [place], LPN routed this conversation to Venia Carbon, MD     [facility]   08/13/22 11:09 AM Tillman Sers, Craig Guess routed this conversation to [place], [place]   [facility]   08/13/22 11:09 AM Note Patient was seen by Dr Silvio Pate on 08/01/22 and prescribed xanax,and it is making him have an upset stomach,he would like to know is there anything else that he can be switched to?

## 2022-08-13 NOTE — Telephone Encounter (Signed)
Called patient states that when he takes the Xanax he starts having [place] for about 2 hours after. He has taken 10 tab from 1/18 to now. It helps with symptoms but gives [place]. States he does not have any loose stools or vomiting. Feels like it helps with symptoms but does not want to continue due to the [place]. He does have Ativan that he takes as well daily. Patient wanted to know if there was anything that could be given in palace of the Xanax for prn use.

## 2022-08-13 NOTE — Telephone Encounter (Signed)
Patient called and stated the medication ALPRAZolam (XANAX) 0.25 MG tablet   is making his stomach feel weird and requested a call back at number [phone].

## 2022-08-13 NOTE — Telephone Encounter (Signed)
Please see MyChart message for conversation.

## 2022-08-14 ENCOUNTER — Telehealth: Payer: Self-pay | Admitting: Nurse Practitioner

## 2022-08-14 NOTE — Telephone Encounter (Signed)
Looks like Dr. Silvio Pate switched the alprazolam to lorazepam. He only needs to take one or the other not both. The lorazepam is used for as needed anxiety.  He is still on buspar 10 and fluoxetine, correct?  Has he started therapy yet?

## 2022-08-14 NOTE — Telephone Encounter (Signed)
Please see all other recent TEs and [facility] encounters for further information. Patient has been advised via [place] of change in medication.

## 2022-08-14 NOTE — Telephone Encounter (Signed)
Left message to return call to our office.  

## 2022-08-14 NOTE — Telephone Encounter (Signed)
Please see alternate TE from 08/14/22.

## 2022-08-14 NOTE — Telephone Encounter (Signed)
Patient called and said he received a call but didn't know who it was.

## 2022-08-28 ENCOUNTER — Telehealth: Payer: Self-pay

## 2022-08-28 NOTE — Telephone Encounter (Signed)
[facility] Night - Client Nonclinical Telephone Record  [facility] Client [facility] Night - Client Client Site [place] - Night Provider Romilda Garret- NP Contact Type Call Who Is Calling Patient / Member / Family / Caregiver Caller Name Jeromi Achord Phone Number [phone] Patient Name Grayer Szafran Patient DOB Mar 27, 1966 Call Type Message Only Information Provided Reason for Call Request for General Office Information Initial Comment Caller stated that her husband has an appt on Monday, needs to make sure it is virtual. Disp. Time Disposition Final User 08/28/2022 1:53:27 PM General Information Provided Yes McGehee, April Call Closed By: April McGehee Transaction Date/Time: 08/28/2022 1:51:28 PM (ET  Sending to [facility] support

## 2022-08-28 NOTE — Telephone Encounter (Signed)
LVM for patient to call back. ?

## 2022-09-02 ENCOUNTER — Telehealth: Payer: Self-pay | Admitting: Nurse Practitioner

## 2022-09-02 ENCOUNTER — Ambulatory Visit (INDEPENDENT_AMBULATORY_CARE_PROVIDER_SITE_OTHER): Payer: BC Managed Care – PPO | Admitting: Clinical

## 2022-09-02 ENCOUNTER — Telehealth (INDEPENDENT_AMBULATORY_CARE_PROVIDER_SITE_OTHER): Payer: BC Managed Care – PPO | Admitting: Nurse Practitioner

## 2022-09-02 ENCOUNTER — Encounter: Payer: Self-pay | Admitting: Nurse Practitioner

## 2022-09-02 VITALS — Ht 73.0 in | Wt 229.0 lb

## 2022-09-02 DIAGNOSIS — F411 Generalized anxiety disorder: Secondary | ICD-10-CM | POA: Diagnosis not present

## 2022-09-02 DIAGNOSIS — F4323 Adjustment disorder with mixed anxiety and depressed mood: Secondary | ICD-10-CM | POA: Diagnosis not present

## 2022-09-02 DIAGNOSIS — F321 Major depressive disorder, single episode, moderate: Secondary | ICD-10-CM

## 2022-09-02 NOTE — Progress Notes (Signed)
                Alpa Salvo, LCSW 

## 2022-09-02 NOTE — Telephone Encounter (Signed)
Patient has been scheduled.

## 2022-09-02 NOTE — Patient Instructions (Signed)
Nice to see you today Given your phone was dying. If you feel like you are not doing well with anxiety or the medications let me know and we can get you set up with a psychiatrist for further evaluation I want to see you on 12/06/2022 or after for your physical and labs

## 2022-09-02 NOTE — Progress Notes (Signed)
[facility] Counselor Initial Adult Exam  Name: Jeffrey Hodges Date: 09/02/2022 MRN: [id number] DOB: 16-Dec-1965 PCP: Michela Pitcher, NP  Time spent: 9:34am - 9:59am  Guardian/Payee:  NA    Paperwork requested:  NA  Reason for Visit /Presenting Problem: Patient reported his physician recommended patient talk to someone about his anxiety and patient reported symptoms of anxiety are affecting his job.   Mental Status Exam: Appearance:   Could not assess      Behavior:  Could not assess  Motor:  Could not assess  Speech/Language:   Clear and Coherent  Affect:  Could not assess  Mood:  anxious  Thought process:  normal  Thought content:    WNL  Sensory/Perceptual disturbances:    WNL  Orientation:  oriented to person, place, situation, and day of week  Attention:  Good  Concentration:  Good  Memory:  WNL  Fund of knowledge:   Good  Insight:    Fair  Judgment:   Good  Impulse Control:  Good   Reported Symptoms:  Patient stated, "it just feels like something bad's going to happen", "it just consumes my every thought", "I worry from the moment I get up to the moment I go to bed". Patient reported difficulty controlling the worry, decreased concentration, restless sleep, difficulty falling asleep and staying asleep, restlessness, on edge, loss of interest, decreased energy, depressed mood, eats once per day, decreased appetite. Patient reported experiencing symptoms of anxiety for the past year and reported he started experiencing symptoms after his wife was diagnosed with Multiple Sclerosis. Patient reported depressive symptoms have occurred for the past year.   Risk Assessment: Danger to Self:  No patient denied current and past suicidal ideation and symptoms of psychosis Self-injurious Behavior: No Danger to Others: No patient denied current and past homicidal ideation Duty to Warn:no Physical Aggression / Violence:No  Access to Firearms a concern: No current concern  but has access Gang Involvement:No  Patient / guardian was educated about steps to take if suicide or homicide risk level increases between visits: yes While future psychiatric events cannot be accurately predicted, the patient does not currently require acute inpatient psychiatric care and does not currently meet [facility] involuntary commitment criteria.  Substance Abuse History: Current substance abuse: Yes  patient reported he currently vapes tobacco off and on throughout the day. Patient reported no current alcohol use but reported use in the past with last use 17 years ago. Patient reported no current or past drug use.   Past Psychiatric History:   No previous psychological problems have been observed Outpatient Providers: none History of Psych Hospitalization: No  Psychological Testing:  none    Abuse History:  Victim of: No.,  none    Report needed: No. Victim of Neglect:No. Perpetrator of  none reported   Witness / Exposure to Domestic Violence: No   Protective Services Involvement: No  Witness to [employer] Violence:  No   Family History: No family history on file.  Living situation: the patient lives with their family (wife and 2 daughters)  Sexual Orientation: Straight  Relationship Status: married  Name of spouse / other: [name] If a parent, number of children / ages: 65 daughters (ages 37, 43, 63, 29)  Support Systems: spouse father  Museum/gallery curator Stress:  No   Income/Employment/Disability: Employment  Armed forces logistics/support/administrative officer: No   Educational History: Education:  associates degree in Investment banker, operational: None reported    Any cultural differences that may affect /  interfere with treatment:  not applicable   Recreation/Hobbies: music  Stressors: Health problems   Occupational concerns   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 90 tablet 1   busPIRone (BUSPAR) 10 MG tablet Take 1 tablet (10 mg total) by mouth 2 (two) times daily. 60 tablet 1   FLUoxetine (PROZAC) 40 MG capsule Take 1 capsule (40 mg total) by mouth daily. 90 capsule 0   LORazepam (ATIVAN) 0.5 MG tablet Take 0.5-1 tablets (0.25-0.5 mg total) by mouth 2 (two) times daily as needed for anxiety. 20 tablet 0   No current facility-administered medications for this visit.    No Known Allergies  Diagnoses:  Adjustment disorder with mixed anxiety and depressed mood  Plan of Care: Patient is a 57 year old male who presented for an initial assessment. Clinician conducted initial assessment via WebEx audio from clinician's home office. Patient reported he was unable to log into the video component of WebEx. Patient provided verbal consent to proceed with telehealth session and participated in session from patient's place of employment. Patient reported his physician recommended patient talk to someone about his anxiety when clinician inquired about reason for today's visit. Patient reported the following symptoms: worry, difficulty controlling the worry, decreased concentration, restless sleep, difficulty falling asleep and staying asleep, restlessness, on edge, loss of interest, decreased energy, depressed mood, eats once per day, and decreased appetite. Patient reported experiencing symptoms of anxiety for the past year and reported he started  experiencing symptoms after his wife was diagnosed with Multiple Sclerosis. Patient reported depressive symptoms have occurred for the past year. Patient denied current and past suicidal ideation, homicidal ideation,  and symptoms of psychosis. Patient reported he currently vapes tobacco off and on throughout the day. Patient reported no current alcohol use but reported use in the past with last use 17 years ago. Patient reported no current or past drug use. Patient reported no history of inpatient or outpatient psychiatric treatment. 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 In addition, patient reported concern regarding recent diagnosis of hypertension. Patient identified his wife and his father as current supports. It is recommended patient be referred to a psychiatrist for a medication management consult and recommended patient participate in individual therapy. Clinician will review recommendations and treatment plan with patient during follow up appointment.   Katherina Right, LCSW

## 2022-09-02 NOTE — Assessment & Plan Note (Signed)
Patient maintained fluoxetine 40 mg has started therapy.  Is on buspirone 10 mg twice daily using lorazepam maybe every other day as needed.  PHQ-9 and GAD scores have not improved any patient states that he is doing okay and does not feel like he needs a medication change at this juncture.  He states that he is not sleeping well.  His phone had 2% left and the visit was ended he was instructed if he needs medication adjustment to call the office and schedule in person visit also can refer him to psychiatry for further evaluation if needed

## 2022-09-02 NOTE — Assessment & Plan Note (Signed)
Currently on fluoxetine 40 mg.  Patient denies HI/SI/AVH.  PHQ-9 GAD-7 administered office.  Patient is currently in therapy if patient does not improve he will need referral to psychiatry

## 2022-09-02 NOTE — Telephone Encounter (Signed)
Can we call patient and get him scheduled for an office visit CPE with labs it needs to be on 12/06/2022 or a little later. He is aware of this request

## 2022-09-02 NOTE — Progress Notes (Signed)
Patient ID: Jeffrey Hodges, male    DOB: 05-26-1966, 57 y.o.   MRN: [id number]  Virtual visit completed through [employer], a video enabled telemedicine application. Due to national recommendations of social distancing due to COVID-19, a virtual visit is felt to be most appropriate for this patient at this time. Reviewed limitations, risks, security and privacy concerns of performing a virtual visit and the availability of in person appointments. I also reviewed that there may be a patient responsible charge related to this service. The patient agreed to proceed.   Patient location: home Provider location: [place] [facility], office Persons participating in this virtual visit: patient, provider   If any vitals were documented, they were collected by patient at home unless specified below.    Ht 6' 1"$  (1.854 m)   Wt 229 lb (103.9 kg)   BMI 30.21 kg/m    CC: MDD/GAD Subjective:   HPI: Jeffrey Hodges is a 57 y.o. male presenting on 09/02/2022 for Medication Consultation  MDD/GAD: Patient was originally seen by me on 04/12/2022.  He was on Lexapro 20 mg daily and hydroxyzine.  Patient was not getting good relief he was switched to fluoxetine 10 mg and titrated up to fluoxetine 20 mg daily.  He was reevaluated by me on 05/24/2022 and we did titrate fluoxetine up to 40 mg.  Patient was seen by colleague Dr. Viviana Simpler on 08/01/2022 patient was titrated up from BuSpar 5 mg twice daily to 10 mg twice daily and given alprazolam as needed.  He is here for virtual follow-up today  States that he is feeling some better. States that he is taking buspar and fluoxetine and started on therapy.  States he had 1 session with a therapist thus far.  He states that he is may be having to use alprazolam every other day.  States that he is not sleeping the best.  Towards the end of the visit he mentioned his phone only had 2% left and was getting ready to die.  We did since he said he was doing okay we  decided to leave medication regimen where it is at for him to continue therapy.  Patient denies HI/SI/AVH     09/02/2022    2:35 PM 08/01/2022    1:54 PM 05/24/2022    9:34 AM  PHQ9 SCORE ONLY  PHQ-9 Total Score 19 19 10       $ 09/02/2022    2:37 PM 08/01/2022    1:55 PM 04/12/2022   12:02 PM 12/04/2021   10:52 AM  GAD 7 : Generalized Anxiety Score  Nervous, Anxious, on Edge 3 3 3 1  $ Control/stop worrying 3 3 3 1  $ Worry too much - different things 3 3 3 1  $ Trouble relaxing 3 3 3 1  $ Restless 1 2 2 1  $ Easily annoyed or irritable 0 2 0 1  Afraid - awful might happen 3 3 3 1  $ Total GAD 7 Score 16 19 17 7  $ Anxiety Difficulty Extremely difficult Very difficult Very difficult Somewhat difficult        Relevant past medical, surgical, family and social history reviewed and updated as indicated. Interim medical history since our last visit reviewed. Allergies and medications reviewed and updated. Outpatient Medications Prior to Visit  Medication Sig Dispense Refill   amLODipine (NORVASC) 5 MG tablet TAKE 1 TABLET (5 MG TOTAL) BY MOUTH DAILY. 90 tablet 1   busPIRone (BUSPAR) 10 MG tablet Take 1 tablet (10 mg  total) by mouth 2 (two) times daily. 60 tablet 1   FLUoxetine (PROZAC) 40 MG capsule Take 1 capsule (40 mg total) by mouth daily. 90 capsule 0   LORazepam (ATIVAN) 0.5 MG tablet Take 0.5-1 tablets (0.25-0.5 mg total) by mouth 2 (two) times daily as needed for anxiety. 20 tablet 0   No facility-administered medications prior to visit.     Per HPI unless specifically indicated in ROS section below Review of Systems Objective:  Ht 6' 1"$  (1.854 m)   Wt 229 lb (103.9 kg)   BMI 30.21 kg/m   Wt Readings from Last 3 Encounters:  09/02/22 229 lb (103.9 kg)  08/01/22 229 lb (103.9 kg)  05/24/22 229 lb (103.9 kg)       Physical exam: Gen: alert, NAD, not ill appearing Pulm: speaks in complete sentences without increased work of breathing Psych: normal mood, normal thought content       Results for orders placed or performed in visit on 08/01/22  Comprehensive metabolic panel  Result Value Ref Range   Sodium 137 135 - 145 mEq/L   Potassium 4.1 3.5 - 5.1 mEq/L   Chloride 98 96 - 112 mEq/L   CO2 27 19 - 32 mEq/L   Glucose, Bld 93 70 - 99 mg/dL   BUN 13 6 - 23 mg/dL   Creatinine, Ser 0.98 0.40 - 1.50 mg/dL   Total Bilirubin 1.0 0.2 - 1.2 mg/dL   Alkaline Phosphatase 93 39 - 117 U/L   AST 55 (H) 0 - 37 U/L   ALT 62 (H) 0 - 53 U/L   Total Protein 7.0 6.0 - 8.3 g/dL   Albumin 4.4 3.5 - 5.2 g/dL   GFR 87.67 >60.00 mL/min   Calcium 9.2 8.4 - 10.5 mg/dL  TSH  Result Value Ref Range   TSH 0.75 0.35 - 5.50 uIU/mL  CBC  Result Value Ref Range   WBC 5.3 4.0 - 10.5 K/uL   RBC 4.35 4.22 - 5.81 Mil/uL   Platelets 186.0 150.0 - 400.0 K/uL   Hemoglobin 15.9 13.0 - 17.0 g/dL   HCT 46.9 39.0 - 52.0 %   MCV 107.8 (H) 78.0 - 100.0 fl   MCHC 33.9 30.0 - 36.0 g/dL   RDW 16.0 (H) 11.5 - 15.5 %   Assessment & Plan:   GAD (generalized anxiety disorder) Assessment & Plan: Patient maintained fluoxetine 40 mg has started therapy.  Is on buspirone 10 mg twice daily using lorazepam maybe every other day as needed.  PHQ-9 and GAD scores have not improved any patient states that he is doing okay and does not feel like he needs a medication change at this juncture.  He states that he is not sleeping well.  His phone had 2% left and the visit was ended he was instructed if he needs medication adjustment to call the office and schedule in person visit also can refer him to psychiatry for further evaluation if needed   Depression, major, single episode, moderate ([place]) Assessment & Plan: Currently on fluoxetine 40 mg.  Patient denies HI/SI/AVH.  PHQ-9 GAD-7 administered office.  Patient is currently in therapy if patient does not improve he will need referral to psychiatry      I discussed the assessment and treatment plan with the patient. The patient was provided an opportunity to  ask questions and all were answered. The patient agreed with the plan and demonstrated an understanding of the instructions. The patient was advised to call back or seek  an in-person evaluation if the symptoms worsen or if the condition fails to improve as anticipated.  Follow up plan: Return in about 3 months (around 12/06/2022) for CPE and Labs.  Romilda Garret, NP

## 2022-09-08 ENCOUNTER — Other Ambulatory Visit: Payer: Self-pay | Admitting: Internal Medicine

## 2022-09-09 ENCOUNTER — Telehealth: Payer: Self-pay

## 2022-09-09 ENCOUNTER — Ambulatory Visit: Payer: BC Managed Care – PPO | Admitting: Family Medicine

## 2022-09-09 ENCOUNTER — Encounter: Payer: Self-pay | Admitting: Family Medicine

## 2022-09-09 VITALS — BP 164/102 | HR 78 | Temp 97.6°F | Ht 73.0 in | Wt 226.2 lb

## 2022-09-09 DIAGNOSIS — I10 Essential (primary) hypertension: Secondary | ICD-10-CM | POA: Diagnosis not present

## 2022-09-09 DIAGNOSIS — E7800 Pure hypercholesterolemia, unspecified: Secondary | ICD-10-CM

## 2022-09-09 DIAGNOSIS — R0789 Other chest pain: Secondary | ICD-10-CM

## 2022-09-09 DIAGNOSIS — E785 Hyperlipidemia, unspecified: Secondary | ICD-10-CM

## 2022-09-09 HISTORY — DX: Hyperlipidemia, unspecified: E78.5

## 2022-09-09 LAB — COMPREHENSIVE METABOLIC PANEL
ALT: 95 U/L — ABNORMAL HIGH (ref 0–53)
AST: 63 U/L — ABNORMAL HIGH (ref 0–37)
Albumin: 4.3 g/dL (ref 3.5–5.2)
Alkaline Phosphatase: 98 U/L (ref 39–117)
BUN: 11 mg/dL (ref 6–23)
CO2: 30 mEq/L (ref 19–32)
Calcium: 9.7 mg/dL (ref 8.4–10.5)
Chloride: 96 mEq/L (ref 96–112)
Creatinine, Ser: 0.97 mg/dL (ref 0.40–1.50)
GFR: 88.69 mL/min (ref >60.00)
Glucose, Bld: 97 mg/dL (ref 70–99)
Potassium: 4.1 mEq/L (ref 3.5–5.1)
Sodium: 138 mEq/L (ref 135–145)
Total Bilirubin: 0.6 mg/dL (ref 0.2–1.2)
Total Protein: 7.4 g/dL (ref 6.0–8.3)

## 2022-09-09 LAB — LIPID PANEL
Cholesterol: 315 mg/dL — ABNORMAL HIGH (ref 0–200)
HDL: 75.4 mg/dL (ref >39.00)
LDL Cholesterol: 224 mg/dL — ABNORMAL HIGH (ref 0–99)
NonHDL: 239.76
Total CHOL/HDL Ratio: 4
Triglycerides: 80 mg/dL (ref 0.0–149.0)
VLDL: 16 mg/dL (ref 0.0–40.0)

## 2022-09-09 LAB — CBC WITH DIFFERENTIAL/PLATELET
Basophils Absolute: 0 10*3/uL (ref 0.0–0.1)
Basophils Relative: 0.5 % (ref 0.0–3.0)
Eosinophils Absolute: 0.2 10*3/uL (ref 0.0–0.7)
Eosinophils Relative: 2.1 % (ref 0.0–5.0)
HCT: 50.1 % (ref 39.0–52.0)
Hemoglobin: 17.2 g/dL — ABNORMAL HIGH (ref 13.0–17.0)
Lymphocytes Relative: 9.6 % — ABNORMAL LOW (ref 12.0–46.0)
Lymphs Abs: 0.7 10*3/uL (ref 0.7–4.0)
MCHC: 34.4 g/dL (ref 30.0–36.0)
MCV: 106.2 fl — ABNORMAL HIGH (ref 78.0–100.0)
Monocytes Absolute: 0.6 10*3/uL (ref 0.1–1.0)
Monocytes Relative: 8.4 % (ref 3.0–12.0)
Neutro Abs: 5.7 10*3/uL (ref 1.4–7.7)
Neutrophils Relative %: 79.4 % — ABNORMAL HIGH (ref 43.0–77.0)
Platelets: 239 10*3/uL (ref 150.0–400.0)
RBC: 4.72 Mil/uL (ref 4.22–5.81)
RDW: 13.9 % (ref 11.5–15.5)
WBC: 7.2 10*3/uL (ref 4.0–10.5)

## 2022-09-09 LAB — TROPONIN I (HIGH SENSITIVITY): High Sens Troponin I: 2901 ng/L (ref 2–17)

## 2022-09-09 MED ORDER — ASPIRIN 81 MG PO TBEC: 81.0000 mg | DELAYED_RELEASE_TABLET | Freq: Every day | ORAL | Status: DC

## 2022-09-09 NOTE — Assessment & Plan Note (Addendum)
Concerning episode of chest discomfort last night in a patient with hypertension and hyperlipidemia during a high stress period.  EKG abnormal today.  Check STAT TnI today. Recommend urgent cardiology evaluation. Start aspirin '81mg'$  while he gets in to see cardiology.  Currently he's free of chest pain. Advised of need to seek ER care if any recurrent chest discomfort.

## 2022-09-09 NOTE — Telephone Encounter (Deleted)
Troponin returned markedly elevated.  I spoke with patient regarding abnormal TnI and need for immediate ER evaluation - I will call ER to notify pt is on his way.  He requests letter written for work excusing him for medical evaluation. Letter written and given to Lattie Haw to place up front for wife to pick up today.

## 2022-09-09 NOTE — Telephone Encounter (Signed)
Saa with [facility] lab called stat critical troponin 2901. Taken to Dr Darnell Level now and placed in lab result notebook.

## 2022-09-09 NOTE — Assessment & Plan Note (Addendum)
Chronic, currently uncontrolled in setting of recent chest discomfort and anxiety.

## 2022-09-09 NOTE — Patient Instructions (Addendum)
Labs today. We will be in touch with results. Start aspirin '81mg'$  daily. Take amlodipine '5mg'$  daily starting when you get home today, and continue checking your blood pressure at home daily, send [place] some readings to determine need for higher dose of medicine.  If any return of chest discomfort, go to ER.

## 2022-09-09 NOTE — Telephone Encounter (Signed)
Noted. Appreciate Dr. Synthia Innocent evaluation

## 2022-09-09 NOTE — Telephone Encounter (Signed)
See other phone note

## 2022-09-09 NOTE — Telephone Encounter (Signed)
[facility] - Client TELEPHONE ADVICE RECORD [facility] Patient Name: Jeffrey Hodges Gender: Male DOB: 1966/01/28 Age: 57 Y 2 M 16 D Return Phone Number: [phone] (Primary), [phone] (Secondary) Address[place]  [address] Client [facility] - Client Client Site [facility] Provider Romilda Garret- NP Contact Type Call Who Is Calling Patient / Member / Family / Caregiver Call Type Triage / Clinical Caller Name Redrick Nordahl Relationship To Patient Spouse Return Phone Number [phone] (Primary) Chief Complaint CHEST PAIN - pain, pressure, heaviness or tightness Reason for Call Symptomatic / Request for Health Information Initial Comment No appointment for today. Translation No Nurse Assessment Nurse: Nicole Cella, RN, [place] Date/Time (Eastern Time): 09/09/2022 10:49:01 AM Confirm and document reason for call. If symptomatic, describe symptoms. ---Caller states her husband is having chest pain, had EMS come last night to be checked, states he did not want to go to hospital just wants blood work done. Still having chest pain , at time of call sleeping. States EMS evaluated 176/92 BP, does take bp medication, vomiting last night. Does the patient have any new or worsening symptoms? ---Yes Will a triage be completed? ---Yes Related visit to physician within the last 2 weeks? ---Yes Does the PT have any chronic conditions? (i.e. diabetes, asthma, this includes High risk factors for pregnancy, etc.) ---Yes List chronic conditions. ---Anxiety HTN Is this a behavioral health or substance abuse call? ---No Guidelines Guideline Title Affirmed Question Affirmed Notes Nurse Date/Time (Eastern Time) Chest Pain [1] Chest pain (or "angina") comes and goes AND [2] is happening more often (increasing in frequency) or getting worse (increasing in [place],  RN, [place] 09/09/2022 10:51:18 AM PLEASE NOTE: All timestamps contained within this report are represented as [place] Standard Time. CONFIDENTIALTY NOTICE: This fax transmission is intended only for the addressee. It contains information that is legally privileged, confidential or otherwise protected from use or disclosure. If you are not the intended recipient, you are strictly prohibited from reviewing, disclosing, copying using or disseminating any of this information or taking any action in reliance Hodges or regarding this information. If you have received this fax in error, please notify us immediately by telephone so that we can arrange for its return to [place]. Phone: [phone], Toll-Free: [phone], Fax: [phone] Page: 2 of 2 Call Id: [id number] Guidelines Guideline Title Affirmed Question Affirmed Notes Nurse Date/Time Eilene Ghazi Time) severity) (Exception: Chest pains that last only a few seconds.) Disp. Time Eilene Ghazi Time) Disposition Final User 09/09/2022 10:43:17 AM Send to Urgent Elease Hashimoto 09/09/2022 10:55:49 AM Go to ED Now Yes Nicole Cella, RN, [place] Final Disposition 09/09/2022 10:55:49 AM Go to ED Now Yes Nicole Cella, RN, [place] Caller Disagree/Comply Disagree Caller Understands Yes PreDisposition InappropriateToAsk Care Advice Given Per Guideline GO TO ED NOW: * You need to be seen in the Emergency Department. * Go to the ED at ___________ [place] now. Drive carefully. ANOTHER ADULT SHOULD DRIVE: * It is better and safer if another adult drives instead of you. BRING MEDICINES: * Bring a list of your current medicines when you go to the Emergency Department (ER). * Bring the pill bottles too. This will help the doctor (or NP/PA) to make certain you are taking the right medicines and the right dose. NOTHING BY MOUTH: * Do not eat or drink anything for now. CALL EMS IF: * Severe difficulty breathing occurs * Passes out or becomes too weak to  stand * You  become worse CARE ADVICE given per Chest Pain (Adult) guideline. Comments User: Prudence Davidson, RN Date/Time Eilene Ghazi Time): 09/09/2022 11:18:09 AM per client prfile, information relayed to office for office RN callback. Referrals GO TO FACILITY REFUSED REFERRED TO PCP OFFIC

## 2022-09-09 NOTE — Telephone Encounter (Addendum)
I spoke with patient regarding abnormal TnI and need for immediate ER evaluation - he is hesitant to go - advised of urgent need for evaluation tonight, of life threatening nature of condition. He stated he would go to ER tonight.  I called [facility] ER and spoke with charge RN to notify pt is on his way.  He requests letter written for work excusing him for medical evaluation. Letter written and given to Lattie Haw to place up front for wife to pick up.  Please call tomorrow to see if he went to ER. If he ended up not going, he will need urgent cardiology evaluation - referral placed today. Would also recommend starting statin for markedly high cholesterol levels (send rosuvastatin '40mg'$  daily #30 RF 3 if pt agrees). Will cc pcp as fyi.

## 2022-09-09 NOTE — Telephone Encounter (Signed)
I spoke with pts wife ([name] and DPR signed);on and off still ? Dull or sharp CP middle of chest; has fluttering feeling in chest like irregular beat last night but no fluttering feeling this morning. Stress event last night with [name]'s daughter. No SOB.vomited a lot last night but no vomiting this morning. No weakness in arms or legs and no H/A or dizziness. Pt has been taking amlodipine 5 mg daily but has not taken so far today. (Do not know reason why). Pt took BP now 162/105 P 93. Pt is still refusing with symptoms to go to ED. I spoke with Romilda Garret NP and he said pt needed to be evaluated and if refusing ED and Dr Darnell Level has appt speak with Dr Darnell Level. I spoke with Dr Darnell Level and with pts symptoms needs eval and recommends pt to go to ED for eval and testing. If pt still refuses ED Dr Darnell Level will see pt this afternoon at 2 PM. Pt s wife said she informed pt that recommendation is needs to go to ED for eval and testing and the lab testing pt wants can be done at ED with fast results to lab testing. Pt told [name] to tell us just to forget it. I explained not trying to upset pt just wants to make sure he understands these symptoms could be related to different dx but most concerning is heart issues. [name] said pt will come today at 2 pm to see Dr Darnell Level. Scheduled appt with Dr Darnell Level and pt will be at office at 1:45 to ck in and UC and Ed precautions given again and [name] voiced understanding. Sending note to Dr Darnell Level and G pool and will send FYI to Romilda Garret NP.

## 2022-09-09 NOTE — Assessment & Plan Note (Signed)
Elevated last year - update today as fasting. Will likely recommend starting statin given elevated ASCVD risk.
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
## 2022-09-10 ENCOUNTER — Emergency Department (HOSPITAL_COMMUNITY): Payer: BC Managed Care – PPO

## 2022-09-10 ENCOUNTER — Other Ambulatory Visit: Payer: Self-pay

## 2022-09-10 ENCOUNTER — Inpatient Hospital Stay (HOSPITAL_COMMUNITY)
Admission: EM | Admit: 2022-09-10 | Discharge: 2022-09-12 | DRG: 322 | Disposition: A | Discharge status: Home / Self Care | Payer: BC Managed Care – PPO | Attending: Cardiovascular Disease | Admitting: Cardiovascular Disease

## 2022-09-10 ENCOUNTER — Encounter (HOSPITAL_COMMUNITY): Payer: Self-pay | Admitting: Cardiovascular Disease

## 2022-09-10 DIAGNOSIS — Z79899 Other long term (current) drug therapy: Secondary | ICD-10-CM

## 2022-09-10 DIAGNOSIS — R7989 Other specified abnormal findings of blood chemistry: Secondary | ICD-10-CM | POA: Diagnosis present

## 2022-09-10 DIAGNOSIS — D751 Secondary polycythemia: Secondary | ICD-10-CM | POA: Diagnosis present

## 2022-09-10 DIAGNOSIS — D7589 Other specified diseases of blood and blood-forming organs: Secondary | ICD-10-CM | POA: Diagnosis present

## 2022-09-10 DIAGNOSIS — R0600 Dyspnea, unspecified: Secondary | ICD-10-CM | POA: Diagnosis not present

## 2022-09-10 DIAGNOSIS — E7801 Familial hypercholesterolemia: Secondary | ICD-10-CM | POA: Diagnosis not present

## 2022-09-10 DIAGNOSIS — E7800 Pure hypercholesterolemia, unspecified: Secondary | ICD-10-CM | POA: Diagnosis present

## 2022-09-10 DIAGNOSIS — L7632 Postprocedural hematoma of skin and subcutaneous tissue following other procedure: Secondary | ICD-10-CM | POA: Diagnosis not present

## 2022-09-10 DIAGNOSIS — I214 Non-ST elevation (NSTEMI) myocardial infarction: Principal | ICD-10-CM | POA: Diagnosis present

## 2022-09-10 DIAGNOSIS — I249 Acute ischemic heart disease, unspecified: Secondary | ICD-10-CM | POA: Diagnosis not present

## 2022-09-10 DIAGNOSIS — I10 Essential (primary) hypertension: Secondary | ICD-10-CM | POA: Diagnosis present

## 2022-09-10 DIAGNOSIS — Z720 Tobacco use: Secondary | ICD-10-CM | POA: Insufficient documentation

## 2022-09-10 DIAGNOSIS — R002 Palpitations: Secondary | ICD-10-CM

## 2022-09-10 DIAGNOSIS — F17290 Nicotine dependence, other tobacco product, uncomplicated: Secondary | ICD-10-CM | POA: Diagnosis present

## 2022-09-10 DIAGNOSIS — Y838 Other surgical procedures as the cause of abnormal reaction of the patient, or of later complication, without mention of misadventure at the time of the procedure: Secondary | ICD-10-CM | POA: Diagnosis not present

## 2022-09-10 DIAGNOSIS — F32A Depression, unspecified: Secondary | ICD-10-CM | POA: Diagnosis present

## 2022-09-10 DIAGNOSIS — R001 Bradycardia, unspecified: Secondary | ICD-10-CM | POA: Diagnosis present

## 2022-09-10 DIAGNOSIS — Z955 Presence of coronary angioplasty implant and graft: Secondary | ICD-10-CM

## 2022-09-10 DIAGNOSIS — H5462 Unqualified visual loss, left eye, normal vision right eye: Secondary | ICD-10-CM | POA: Diagnosis present

## 2022-09-10 DIAGNOSIS — F419 Anxiety disorder, unspecified: Secondary | ICD-10-CM | POA: Diagnosis present

## 2022-09-10 DIAGNOSIS — E785 Hyperlipidemia, unspecified: Secondary | ICD-10-CM | POA: Diagnosis present

## 2022-09-10 DIAGNOSIS — Z7982 Long term (current) use of aspirin: Secondary | ICD-10-CM

## 2022-09-10 LAB — TROPONIN I (HIGH SENSITIVITY)
Troponin I (High Sensitivity): 1226 ng/L (ref <18)
Troponin I (High Sensitivity): 1386 ng/L (ref <18)
Troponin I (High Sensitivity): 1190 ng/L (ref <18)
Troponin I (High Sensitivity): 1138 ng/L (ref <18)

## 2022-09-10 LAB — CBC
HCT: 50.2 % (ref 39.0–52.0)
Hemoglobin: 17.7 g/dL — ABNORMAL HIGH (ref 13.0–17.0)
MCH: 36.6 pg — ABNORMAL HIGH (ref 26.0–34.0)
MCHC: 35.3 g/dL (ref 30.0–36.0)
MCV: 103.7 fL — ABNORMAL HIGH (ref 80.0–100.0)
Platelets: 239 10*3/uL (ref 150–400)
RBC: 4.84 MIL/uL (ref 4.22–5.81)
RDW: 13.1 % (ref 11.5–15.5)
WBC: 6.4 10*3/uL (ref 4.0–10.5)
nRBC: 0 % (ref 0.0–0.2)

## 2022-09-10 LAB — BASIC METABOLIC PANEL
Anion gap: 13 (ref 5–15)
BUN: 6 mg/dL (ref 6–20)
CO2: 25 mmol/L (ref 22–32)
Calcium: 9.2 mg/dL (ref 8.9–10.3)
Chloride: 99 mmol/L (ref 98–111)
Creatinine, Ser: 1.04 mg/dL (ref 0.61–1.24)
GFR, Estimated: >60 mL/min (ref >60)
Glucose, Bld: 122 mg/dL — ABNORMAL HIGH (ref 70–99)
Potassium: 4 mmol/L (ref 3.5–5.1)
Sodium: 137 mmol/L (ref 135–145)

## 2022-09-10 LAB — LIPID PANEL
Cholesterol: 301 mg/dL — ABNORMAL HIGH (ref 0–200)
HDL: 78 mg/dL (ref >40)
LDL Cholesterol: 207 mg/dL — ABNORMAL HIGH (ref 0–99)
Total CHOL/HDL Ratio: 3.9 RATIO
Triglycerides: 82 mg/dL (ref <150)
VLDL: 16 mg/dL (ref 0–40)

## 2022-09-10 LAB — HEPARIN LEVEL (UNFRACTIONATED): Heparin Unfractionated: 0.26 IU/mL — ABNORMAL LOW (ref 0.30–0.70)

## 2022-09-10 MED ORDER — SODIUM CHLORIDE 0.9% FLUSH: 3.0000 mL | INTRAVENOUS | Status: DC | PRN

## 2022-09-10 MED ORDER — HEPARIN BOLUS VIA INFUSION
4000.0000 [IU] | Freq: Once | INTRAVENOUS | Status: AC
  Administered 2022-09-10: 4000 [IU] via INTRAVENOUS
  Filled 2022-09-10: qty 4000

## 2022-09-10 MED ORDER — LORAZEPAM 0.5 MG PO TABS
0.2500 mg | ORAL_TABLET | Freq: Two times a day (BID) | ORAL | Status: DC | PRN
  Administered 2022-09-10 – 2022-09-11 (×2): 0.25 mg via ORAL
  Filled 2022-09-10 (×3): qty 1

## 2022-09-10 MED ORDER — SODIUM CHLORIDE 0.9 % WEIGHT BASED INFUSION
1.0000 mL/kg/h | INTRAVENOUS | Status: DC
  Administered 2022-09-11: 1 mL/kg/h via INTRAVENOUS

## 2022-09-10 MED ORDER — BUSPIRONE HCL 5 MG PO TABS
10.0000 mg | ORAL_TABLET | Freq: Two times a day (BID) | ORAL | Status: DC
  Administered 2022-09-10 – 2022-09-12 (×4): 10 mg via ORAL
  Filled 2022-09-10 (×4): qty 2

## 2022-09-10 MED ORDER — AMLODIPINE BESYLATE 5 MG PO TABS: 5.0000 mg | ORAL_TABLET | Freq: Every day | ORAL | Status: DC

## 2022-09-10 MED ORDER — AMLODIPINE BESYLATE 5 MG PO TABS
2.5000 mg | ORAL_TABLET | Freq: Once | ORAL | Status: AC
  Administered 2022-09-10: 2.5 mg via ORAL
  Filled 2022-09-10: qty 1

## 2022-09-10 MED ORDER — NITROGLYCERIN 0.4 MG SL SUBL: 0.4000 mg | SUBLINGUAL_TABLET | SUBLINGUAL | Status: DC | PRN

## 2022-09-10 MED ORDER — ATORVASTATIN CALCIUM 80 MG PO TABS
80.0000 mg | ORAL_TABLET | Freq: Every day | ORAL | Status: DC
  Administered 2022-09-10 – 2022-09-11 (×2): 80 mg via ORAL
  Filled 2022-09-10 (×2): qty 1

## 2022-09-10 MED ORDER — SODIUM CHLORIDE 0.9% FLUSH
3.0000 mL | Freq: Two times a day (BID) | INTRAVENOUS | Status: DC
  Administered 2022-09-10 – 2022-09-12 (×3): 3 mL via INTRAVENOUS

## 2022-09-10 MED ORDER — AMLODIPINE BESYLATE 5 MG PO TABS: 2.5000 mg | ORAL_TABLET | Freq: Every day | ORAL | Status: DC

## 2022-09-10 MED ORDER — ONDANSETRON HCL 4 MG/2ML IJ SOLN: 4.0000 mg | Freq: Four times a day (QID) | INTRAMUSCULAR | Status: DC | PRN

## 2022-09-10 MED ORDER — ACETAMINOPHEN 325 MG PO TABS: 650.0000 mg | ORAL_TABLET | ORAL | Status: DC | PRN

## 2022-09-10 MED ORDER — SODIUM CHLORIDE 0.9 % IV SOLN: 250.0000 mL | INTRAVENOUS | Status: DC | PRN

## 2022-09-10 MED ORDER — SODIUM CHLORIDE 0.9 % WEIGHT BASED INFUSION
3.0000 mL/kg/h | INTRAVENOUS | Status: DC
  Administered 2022-09-11: 3 mL/kg/h via INTRAVENOUS

## 2022-09-10 MED ORDER — ASPIRIN 81 MG PO TBEC
81.0000 mg | DELAYED_RELEASE_TABLET | Freq: Every day | ORAL | Status: DC
  Administered 2022-09-11: 81 mg via ORAL
  Filled 2022-09-10 (×2): qty 1

## 2022-09-10 MED ORDER — ASPIRIN 325 MG PO TABS
325.0000 mg | ORAL_TABLET | Freq: Every day | ORAL | Status: DC
  Administered 2022-09-10: 325 mg via ORAL
  Filled 2022-09-10: qty 1

## 2022-09-10 MED ORDER — HEPARIN (PORCINE) 25000 UT/250ML-% IV SOLN
1550.0000 [IU]/h | INTRAVENOUS | Status: DC
  Administered 2022-09-10: 1200 [IU]/h via INTRAVENOUS
  Administered 2022-09-11: 1400 [IU]/h via INTRAVENOUS
  Filled 2022-09-10 (×2): qty 250

## 2022-09-10 MED ORDER — FLUOXETINE HCL 20 MG PO CAPS
40.0000 mg | ORAL_CAPSULE | Freq: Every day | ORAL | Status: DC
  Administered 2022-09-11 – 2022-09-12 (×2): 40 mg via ORAL
  Filled 2022-09-10 (×2): qty 2

## 2022-09-10 MED ORDER — METOPROLOL TARTRATE 12.5 MG HALF TABLET
12.5000 mg | ORAL_TABLET | Freq: Two times a day (BID) | ORAL | Status: DC
  Administered 2022-09-10 – 2022-09-12 (×5): 12.5 mg via ORAL
  Filled 2022-09-10 (×5): qty 1
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
## 2022-09-10 NOTE — Progress Notes (Signed)
ANTICOAGULATION CONSULT NOTE - Initial Consult  Pharmacy Consult for Heparin Indication: chest pain/ACS  No Known Allergies  Patient Measurements: Height: '6\' 1"'$  (185.4 cm) Weight: 104.3 kg (230 lb) IBW/kg (Calculated) : 79.9 Heparin Dosing Weight: 101 kg  Vital Signs: Temp: 97.8 F (36.6 C) (02/27 1002) BP: 136/105 (02/27 1002) Pulse Rate: 88 (02/27 1002)  Labs: Recent Labs    09/09/22 1448 09/10/22 1005  HGB 17.2* 17.7*  HCT 50.1 50.2  PLT 239.0 239  CREATININE 0.97 1.04  TROPONINIHS  --  1,226*    Estimated Creatinine Clearance: 103 mL/min (by C-G formula based on SCr of 1.04 mg/dL).   Medical History: Past Medical History:  Diagnosis Date   Anxiety    Depression    Hyperlipidemia 09/09/2022   Hypertension     Assessment: 17 YOM presenting with chest pain concerning for ACS. Not on anticoagulation PTA. Pharmacy consulted to dose heparin  CBC stable with no signs of bleeding  Goal of Therapy:  Heparin level 0.3-0.7 units/ml Monitor platelets by anticoagulation protocol: Yes   Plan:  Heparin 4000 units IV bolus then 1200 units/hr Check 6hr heparin level at 1800 Daily CBC and heparin level Monitor for signs/symptoms of bleeding  Merrilee Jansky, PharmD Clinical Pharmacist 09/10/2022,11:48 AM

## 2022-09-10 NOTE — Progress Notes (Signed)
ANTICOAGULATION CONSULT NOTE  Pharmacy Consult for Heparin Indication: chest pain/ACS  No Known Allergies  Patient Measurements: Height: '6\' 1"'$  (185.4 cm) Weight: 99.3 kg (218 lb 14.4 oz) IBW/kg (Calculated) : 79.9 Heparin Dosing Weight: 101 kg  Vital Signs: Temp: 98.9 F (37.2 C) (02/27 2028) Temp Source: Oral (02/27 2028) BP: 146/96 (02/27 2028) Pulse Rate: 67 (02/27 2028)  Labs: Recent Labs    09/09/22 1448 09/10/22 1005 09/10/22 1005 09/10/22 1206 09/10/22 1515 09/10/22 1748 09/10/22 2013  HGB 17.2* 17.7*  --   --   --   --   --   HCT 50.1 50.2  --   --   --   --   --   PLT 239.0 239  --   --   --   --   --   HEPARINUNFRC  --   --   --   --   --   --  0.26*  CREATININE 0.97 1.04  --   --   --   --   --   TROPONINIHS  --  1,226*   < > 1,386* 1,190* 1,138*  --    < > = values in this interval not displayed.    Estimated Creatinine Clearance: 100.7 mL/min (by C-G formula based on SCr of 1.04 mg/dL).   Assessment: 52 YOM presenting with chest pain concerning for ACS. Not on anticoagulation PTA. Pharmacy consulted to dose heparin. Heparin level low 0.26 at 1200 units/hr. No issues with infusion or bleeding reported.   Goal of Therapy:  Heparin level 0.3-0.7 units/ml Monitor platelets by anticoagulation protocol: Yes   Plan:  Increase heparin infusion to 1400 units/hr Check heparin level in 6 hours and daily while on heparin Continue to monitor H&H and platelets  Thank you for allowing pharmacy to be a part of this patient's care.  Ardyth Harps, PharmD Clinical Pharmacist

## 2022-09-10 NOTE — ED Provider Notes (Signed)
[facility] Provider Note   CSN: [id number] Arrival date & time: 09/10/22  [id number]     History  Chief Complaint  Patient presents with   Chest Pain    Jeffrey Hodges is a 57 y.o. male.   Chest Pain    This is a 36 old male with history of hypertension, hyperlipidemia, depression presenting to the emergency department due to chest pain.  It started 3 days ago, started in the middle of an argument.  States its intermittent, worse when he feels stressed out.  It feels like a fluttering sensation in the middle of his chest without radiation, associated with nausea but no vomiting.  Denies feeling shortness of breath or lower extremity swelling.  No history of ACS, denies any family history of early ACS.  He vapes but does not smoke tobacco anymore.  Patient had a troponin drawn in his family medicine doctor which was elevated to 2900, instructed to go to the emergency department for further evaluation.  No medicine prior to arrival.  Currently is asymptomatic.  Home Medications Prior to Admission medications   Medication Sig Start Date End Date Taking? Authorizing Provider  amLODipine (NORVASC) 5 MG tablet TAKE 1 TABLET (5 MG TOTAL) BY MOUTH DAILY. 04/15/22   Michela Pitcher, NP  aspirin EC 81 MG tablet Take 1 tablet (81 mg total) by mouth daily. Swallow whole. 09/09/22   Ria Bush, MD  busPIRone (BUSPAR) 10 MG tablet Take 1 tablet (10 mg total) by mouth 2 (two) times daily. 08/01/22   Venia Carbon, MD  FLUoxetine (PROZAC) 40 MG capsule Take 1 capsule (40 mg total) by mouth daily. 05/24/22   Michela Pitcher, NP  LORazepam (ATIVAN) 0.5 MG tablet TAKE 1/2 - 1 TABLET (0.25 - 0.5 MG TOTAL) BY MOUTH TWICE A DAY AS NEEDED FOR ANXIETY 09/09/22   Michela Pitcher, NP      Allergies    Patient has no known allergies.    Review of Systems   Review of Systems  Cardiovascular:  Positive for chest pain.    Physical Exam Updated Vital Signs BP  132/81   Pulse 70   Temp 97.8 F (36.6 C)   Resp 18   Ht '6\' 1"'$  (1.854 m)   Wt 104.3 kg   SpO2 96%   BMI 30.34 kg/m  Physical Exam Vitals and nursing note reviewed. Exam conducted with a chaperone present.  Constitutional:      Appearance: Normal appearance.  HENT:     Head: Normocephalic and atraumatic.  Eyes:     General: No scleral icterus.       Right eye: No discharge.        Left eye: No discharge.     Extraocular Movements: Extraocular movements intact.     Pupils: Pupils are equal, round, and reactive to light.  Cardiovascular:     Rate and Rhythm: Normal rate and regular rhythm.     Pulses: Normal pulses.     Heart sounds: Normal heart sounds. No murmur heard.    No friction rub. No gallop.  Pulmonary:     Effort: Pulmonary effort is normal. No respiratory distress.     Breath sounds: Normal breath sounds.  Abdominal:     General: Abdomen is flat. Bowel sounds are normal. There is no distension.     Palpations: Abdomen is soft.     Tenderness: There is no abdominal tenderness.  Musculoskeletal:  Right lower leg: No edema.     Left lower leg: No edema.  Skin:    General: Skin is warm and dry.     Coloration: Skin is not jaundiced.  Neurological:     Mental Status: He is alert. Mental status is at baseline.     Coordination: Coordination normal.     ED Results / Procedures / Treatments   Labs (all labs ordered are listed, but only abnormal results are displayed) Labs Reviewed  BASIC METABOLIC PANEL - Abnormal; Notable for the following components:      Result Value   Glucose, Bld 122 (*)    All other components within normal limits  CBC - Abnormal; Notable for the following components:   Hemoglobin 17.7 (*)    MCV 103.7 (*)    MCH 36.6 (*)    All other components within normal limits  TROPONIN I (HIGH SENSITIVITY) - Abnormal; Notable for the following components:   Troponin I (High Sensitivity) 1,226 (*)    All other components within normal  limits  TROPONIN I (HIGH SENSITIVITY) - Abnormal; Notable for the following components:   Troponin I (High Sensitivity) 1,386 (*)    All other components within normal limits  HEPARIN LEVEL (UNFRACTIONATED)  LIPID PANEL  HEMOGLOBIN A1C  TROPONIN I (HIGH SENSITIVITY)    EKG EKG Interpretation  Date/Time:  Tuesday September 10 2022 10:07:03 EST Ventricular Rate:  89 PR Interval:  130 QRS Duration: 80 QT Interval:  394 QTC Calculation: 479 R Axis:   84 Text Interpretation: Normal sinus rhythm Normal ECG When compared with ECG of 19-Sep-2006 05:35, PREVIOUS ECG IS PRESENT Confirmed by Pattricia Boss [phone]) on 09/10/2022 11:59:44 AM  Radiology DG Chest 2 View  Result Date: 09/10/2022 CLINICAL DATA:  Chest pain, abnormal EKG EXAM: CHEST - 2 VIEW COMPARISON:  Chest radiograph 09/19/2006 FINDINGS: The cardiomediastinal silhouette is normal. There is no focal consolidation or pulmonary edema. There is no pleural effusion or pneumothorax There is no acute osseous abnormality. IMPRESSION: No radiographic evidence of acute cardiopulmonary process. Electronically Signed   By: Valetta Mole M.D.   On: 09/10/2022 10:54    Procedures .Critical Care  Performed by: Sherrill Raring, PA-C Authorized by: Sherrill Raring, PA-C   Critical care provider statement:    Critical care time (minutes):  47   Critical care start time:  09/10/2022 11:30 AM   Critical care end time:  09/10/2022 12:17 PM   Critical care time was exclusive of:  Separately billable procedures and treating other patients   Critical care was necessary to treat or prevent imminent or life-threatening deterioration of the following conditions:  Cardiac failure   Critical care was time spent personally by me on the following activities:  Development of treatment plan with patient or surrogate, discussions with consultants, evaluation of patient's response to treatment, examination of patient, ordering and review of laboratory studies, ordering and  review of radiographic studies, ordering and performing treatments and interventions, pulse oximetry, re-evaluation of patient's condition and review of old charts   Care discussed with: admitting provider       Medications Ordered in ED Medications  heparin ADULT infusion 100 units/mL (25000 units/248m) (1,200 Units/hr Intravenous New Bag/Given 09/10/22 1216)  metoprolol tartrate (LOPRESSOR) tablet 12.5 mg (has no administration in time range)  atorvastatin (LIPITOR) tablet 80 mg (has no administration in time range)  aspirin EC tablet 81 mg (has no administration in time range)  amLODipine (NORVASC) tablet 5 mg (has no administration in  time range)  busPIRone (BUSPAR) tablet 10 mg (has no administration in time range)  FLUoxetine (PROZAC) capsule 40 mg (has no administration in time range)  LORazepam (ATIVAN) tablet 0.25 mg (has no administration in time range)  heparin bolus via infusion 4,000 Units (4,000 Units Intravenous Bolus from Bag 09/10/22 1215)    ED Course/ Medical Decision Making/ A&P                             Medical Decision Making Amount and/or Complexity of Data Reviewed Labs: ordered. Radiology: ordered.  Risk OTC drugs. Prescription drug management. Decision regarding hospitalization.   This is a 57 year old male with history of hypertension, hyperlipidemia, vaping presenting to the emergency department due to chest pain.  Differential includes ACS, PE, pneumonia, dissection, arrhythmia, anxiety.  Laboratory workup was ordered, viewed interpreted by myself.  No leukocytosis anemia, BMP without gross electro derangement or AKI.  Patient's troponin is notably elevated at 1226, I will consult cardiology and start patient on heparin as I am concerned about an NSTEMI.    EKG shows sinus rhythm without any ST elevations, chest x-ray is negative for acute process.  Specifically no widened mediastinum.  I think patient's symptoms are most consistent with NSTEMI, I  do not think this is a PE or dissection, cardiology will admit the patient.        Final Clinical Impression(s) / ED Diagnoses Final diagnoses:  None    Rx / DC Orders ED Discharge Orders     None         Sherrill Raring, [place] 09/10/22 1445    Pattricia Boss, MD 09/10/22 1555

## 2022-09-10 NOTE — ED Notes (Signed)
ED TO INPATIENT HANDOFF REPORT  ED Nurse Name and Phone #:   S Name/Age/Gender Jeffrey Hodges 57 y.o. male Room/Bed: 034C/034C  Code Status   Code Status: Full Code  Home/SNF/Other Home Patient oriented to: self, place, time, and situation Is this baseline? Yes   Triage Complete: Triage complete  Chief Complaint NSTEMI (non-ST elevated myocardial infarction) [facility]) [I21.4]  Triage Note Pt sent from his cardiologist for further eval of intermittent, non radiating, central chest pain x 1 week. Patient endorses associated nausea and states symptoms are worse when he is anxious.    Allergies No Known Allergies  Level of Care/Admitting Diagnosis ED Disposition     ED Disposition  Admit   Condition  --   [place]: [facility] [[id number]]  Level of Care: Telemetry Cardiac [[id number]]  May place patient in observation at [facility] or [facility] if equivalent level of care is available:: No  Covid Evaluation: Asymptomatic - no recent exposure (last 10 days) testing not required  Diagnosis: NSTEMI (non-ST elevated myocardial infarction) [facility]) [id number]  Admitting Physician: Troy Sine [[id number]]  Attending Physician: Troy Sine [[id number]]          B Medical/Surgery History Past Medical History:  Diagnosis Date   Anxiety    Depression    Hyperlipidemia 09/09/2022   Hypertension    Past Surgical History:  Procedure Laterality Date   EYE SURGERY Left 1989   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     A IV Location/Drains/Wounds Patient Lines/Drains/Airways Status     Active Line/Drains/Airways     Name Placement date Placement time Site Days   Peripheral IV 09/10/22 18 G Right Antecubital 09/10/22  1206  Antecubital  less than 1   Peripheral IV 09/10/22 20 G Left;Posterior Hand 09/10/22  1250  Hand  less than 1            Intake/Output Last 24 hours No intake or output data in the 24 hours ending 09/10/22  1859  Labs/Imaging Results for orders placed or performed during the hospital encounter of 09/10/22 (from the past 48 hour(s))  Basic metabolic panel     Status: Abnormal   Collection Time: 09/10/22 10:05 AM  Result Value Ref Range   Sodium 137 135 - 145 mmol/L   Potassium 4.0 3.5 - 5.1 mmol/L   Chloride 99 98 - 111 mmol/L   CO2 25 22 - 32 mmol/L   Glucose, Bld 122 (H) 70 - 99 mg/dL    Comment: Glucose reference range applies only to samples taken after fasting for at least 8 hours.   BUN 6 6 - 20 mg/dL   Creatinine, Ser 1.04 0.61 - 1.24 mg/dL   Calcium 9.2 8.9 - 10.3 mg/dL   GFR, Estimated >60 >60 mL/min    Comment: (NOTE) Calculated using the CKD-EPI Creatinine Equation (2021)    Anion gap 13 5 - 15    Comment: Performed at [facility] [address], [place] [address]  CBC     Status: Abnormal   Collection Time: 09/10/22 10:05 AM  Result Value Ref Range   WBC 6.4 4.0 - 10.5 K/uL   RBC 4.84 4.22 - 5.81 MIL/uL   Hemoglobin 17.7 (H) 13.0 - 17.0 g/dL   HCT 50.2 39.0 - 52.0 %   MCV 103.7 (H) 80.0 - 100.0 fL   MCH 36.6 (H) 26.0 - 34.0 pg   MCHC 35.3 30.0 - 36.0 g/dL  RDW 13.1 11.5 - 15.5 %   Platelets 239 150 - 400 K/uL   nRBC 0.0 0.0 - 0.2 %    Comment: Performed at [facility], [place] [address] [place], [place] [address]  Troponin I (High Sensitivity)     Status: Abnormal   Collection Time: 09/10/22 10:05 AM  Result Value Ref Range   Troponin I (High Sensitivity) 1,226 (HH) <18 ng/L    Comment: CRITICAL RESULT CALLED TO, READ BACK BY AND VERIFIED WITH S.BERTRAND,RN 1129 09/10/22 CLARK,S (NOTE) Elevated high sensitivity troponin I (hsTnI) values and significant  changes across serial measurements may suggest ACS but many other  chronic and acute conditions are known to elevate hsTnI results.  Refer to the "Links" section for chest pain algorithms and additional  guidance. Performed at [facility], [place] [address] [place], [place] [address]    Lipid panel     Status: Abnormal   Collection Time: 09/10/22 10:05 AM  Result Value Ref Range   Cholesterol 301 (H) 0 - 200 mg/dL   Triglycerides 82 <150 mg/dL   HDL 78 >40 mg/dL   Total CHOL/HDL Ratio 3.9 RATIO   VLDL 16 0 - 40 mg/dL   LDL Cholesterol 207 (H) 0 - 99 mg/dL    Comment:        Total Cholesterol/HDL:CHD Risk Coronary Heart Disease Risk Table                     Men   Women  1/2 Average Risk   3.4   3.3  Average Risk       5.0   4.4  2 X Average Risk   9.6   7.1  3 X Average Risk  23.4   11.0        Use the calculated Patient Ratio above and the CHD Risk Table to determine the patient's CHD Risk.        ATP III CLASSIFICATION (LDL):  <100     mg/dL   Optimal  100-129  mg/dL   Near or Above                    Optimal  130-159  mg/dL   Borderline  160-189  mg/dL   High  >190     mg/dL   Very High Performed at [facility] [address], [place], [place] [address]   Troponin I (High Sensitivity)     Status: Abnormal   Collection Time: 09/10/22 12:06 PM  Result Value Ref Range   Troponin I (High Sensitivity) 1,386 (HH) <18 ng/L    Comment: CRITICAL VALUE NOTED. VALUE IS CONSISTENT WITH PREVIOUSLY REPORTED/CALLED VALUE (NOTE) Elevated high sensitivity troponin I (hsTnI) values and significant  changes across serial measurements may suggest ACS but many other  chronic and acute conditions are known to elevate hsTnI results.  Refer to the "Links" section for chest pain algorithms and additional  guidance. Performed at [facility], [place] [address], [place],  [address]   Troponin I (High Sensitivity)     Status: Abnormal   Collection Time: 09/10/22  3:15 PM  Result Value Ref Range   Troponin I (High Sensitivity) 1,190 (HH) <18 ng/L    Comment: CRITICAL VALUE NOTED. VALUE IS CONSISTENT WITH PREVIOUSLY REPORTED/CALLED VALUE (NOTE) Elevated high sensitivity troponin I (hsTnI) values and significant  changes across serial measurements may  suggest ACS but many other  chronic and acute conditions  are known to elevate hsTnI results.  Refer to the "Links" section for chest pain algorithms and additional  guidance. Performed at [facility], [place] [address] [place], [place] [address]   Troponin I (High Sensitivity)     Status: Abnormal   Collection Time: 09/10/22  5:48 PM  Result Value Ref Range   Troponin I (High Sensitivity) 1,138 (HH) <18 ng/L    Comment: CRITICAL VALUE NOTED. VALUE IS CONSISTENT WITH PREVIOUSLY REPORTED/CALLED VALUE (NOTE) Elevated high sensitivity troponin I (hsTnI) values and significant  changes across serial measurements may suggest ACS but many other  chronic and acute conditions are known to elevate hsTnI results.  Refer to the "Links" section for chest pain algorithms and additional  guidance. Performed at [facility], [place] [address], [place], [place] [address]    DG Chest 2 View  Result Date: 09/10/2022 CLINICAL DATA:  Chest pain, abnormal EKG EXAM: CHEST - 2 VIEW COMPARISON:  Chest radiograph 09/19/2006 FINDINGS: The cardiomediastinal silhouette is normal. There is no focal consolidation or pulmonary edema. There is no pleural effusion or pneumothorax There is no acute osseous abnormality. IMPRESSION: No radiographic evidence of acute cardiopulmonary process. Electronically Signed   By: Valetta Mole M.D.   On: 09/10/2022 10:54    Pending Labs Unresulted Labs (From admission, onward)     Start     Ordered   09/11/22 0500  CBC  Daily,   R      09/10/22 1154   09/11/22 0500  Heparin level (unfractionated)  Daily,   R      09/10/22 1154   09/11/22 0500  Folate  Tomorrow morning,   R        09/10/22 1545   09/11/22 0500  Vitamin B12  Tomorrow morning,   R        09/10/22 1545   09/11/22 0500  Comprehensive metabolic panel  Tomorrow morning,   R        09/10/22 1614   09/10/22 1800  Heparin level (unfractionated)  Once-Timed,   URGENT        09/10/22 1154   09/10/22 1432   Hemoglobin A1c  Once,   URGENT        09/10/22 1433   Signed and Held  Lipoprotein A (LPA)  Tomorrow morning,   R        Signed and Held            Vitals/Pain Today's Vitals   09/10/22 1730 09/10/22 1745 09/10/22 1800 09/10/22 1850  BP: (!) 127/98 (!) 147/100 (!) 143/99 132/88  Pulse: 63 62 66 65  Resp: '15 18 13 18  '$ Temp:      TempSrc:      SpO2: 96% 96% 97% 96%  Weight:      Height:      PainSc:        Isolation Precautions No active isolations  Medications Medications  heparin ADULT infusion 100 units/mL (25000 units/252m) (1,200 Units/hr Intravenous New Bag/Given 09/10/22 1216)  metoprolol tartrate (LOPRESSOR) tablet 12.5 mg (12.5 mg Oral Given 09/10/22 1509)  atorvastatin (LIPITOR) tablet 80 mg (has no administration in time range)  aspirin EC tablet 81 mg (81 mg Oral Not Given 09/10/22 1445)  busPIRone (BUSPAR) tablet 10 mg (has no administration in time range)  FLUoxetine (PROZAC) capsule 40 mg (has no administration in time range)  LORazepam (ATIVAN) tablet 0.25 mg (has no administration in time range)  amLODipine (NORVASC) tablet 5 mg (5 mg  Oral Not Given 09/10/22 1716)  heparin bolus via infusion 4,000 Units (4,000 Units Intravenous Bolus from Bag 09/10/22 1215)  amLODipine (NORVASC) tablet 2.5 mg (2.5 mg Oral Given 09/10/22 1737)    Mobility walks     Focused Assessments Cardiac Assessment Handoff:    No results found for: "CKTOTAL", "CKMB", "CKMBINDEX", "TROPONINI" No results found for: "DDIMER" Does the Patient currently have chest pain? No    R Recommendations: See Admitting Provider Note  Report given to:   Additional Notes: Pt scheduled for Cath tomorrow at 1430.

## 2022-09-10 NOTE — Plan of Care (Signed)
  Problem: Education: Goal: Knowledge of General Education information will improve Description: Including pain rating scale, medication(s)/side effects and non-pharmacologic comfort measures Outcome: Progressing   Problem: Health Behavior/Discharge Planning: Goal: Ability to manage health-related needs will improve Outcome: Progressing   Problem: Clinical Measurements: Goal: Cardiovascular complication will be avoided Outcome: Progressing   Problem: Coping: Goal: Level of anxiety will decrease Outcome: Progressing   Problem: Pain Managment: Goal: General experience of comfort will improve Outcome: Progressing   Problem: Safety: Goal: Ability to remain free from injury will improve Outcome: Progressing

## 2022-09-10 NOTE — Telephone Encounter (Addendum)
I called pt yesterday 3 further times - all went to voicemail. Left message again advising patient go to ER for evaluation. I called this morning and again went to voicemail.  Will forward to referral team to get pt stat cardiology appt as I don't see where he went to ER.  See below [id number] commencement.

## 2022-09-10 NOTE — ED Notes (Signed)
ED Provider at bedside. 

## 2022-09-10 NOTE — Telephone Encounter (Signed)
I reached out to Cardiology regarding this referral and getting patient scheduled ASAP.   See referral notes

## 2022-09-10 NOTE — ED Notes (Signed)
Pt given soda per request ?

## 2022-09-10 NOTE — ED Triage Notes (Signed)
Pt sent from his cardiologist for further eval of intermittent, non radiating, central chest pain x 1 week. Patient endorses associated nausea and states symptoms are worse when he is anxious.

## 2022-09-11 ENCOUNTER — Encounter (HOSPITAL_COMMUNITY)
Admission: EM | Disposition: A | Discharge status: Home / Self Care | Payer: Self-pay | Source: Home / Self Care | Attending: Cardiovascular Disease

## 2022-09-11 ENCOUNTER — Observation Stay (HOSPITAL_COMMUNITY): Payer: BC Managed Care – PPO

## 2022-09-11 DIAGNOSIS — F419 Anxiety disorder, unspecified: Secondary | ICD-10-CM | POA: Diagnosis present

## 2022-09-11 DIAGNOSIS — L7632 Postprocedural hematoma of skin and subcutaneous tissue following other procedure: Secondary | ICD-10-CM | POA: Diagnosis not present

## 2022-09-11 DIAGNOSIS — Y838 Other surgical procedures as the cause of abnormal reaction of the patient, or of later complication, without mention of misadventure at the time of the procedure: Secondary | ICD-10-CM | POA: Diagnosis not present

## 2022-09-11 DIAGNOSIS — D7589 Other specified diseases of blood and blood-forming organs: Secondary | ICD-10-CM | POA: Diagnosis present

## 2022-09-11 DIAGNOSIS — I214 Non-ST elevation (NSTEMI) myocardial infarction: Secondary | ICD-10-CM | POA: Diagnosis present

## 2022-09-11 DIAGNOSIS — R001 Bradycardia, unspecified: Secondary | ICD-10-CM | POA: Diagnosis present

## 2022-09-11 DIAGNOSIS — Z7982 Long term (current) use of aspirin: Secondary | ICD-10-CM | POA: Diagnosis not present

## 2022-09-11 DIAGNOSIS — R7989 Other specified abnormal findings of blood chemistry: Secondary | ICD-10-CM

## 2022-09-11 DIAGNOSIS — E7800 Pure hypercholesterolemia, unspecified: Secondary | ICD-10-CM | POA: Diagnosis present

## 2022-09-11 DIAGNOSIS — I2510 Atherosclerotic heart disease of native coronary artery without angina pectoris: Secondary | ICD-10-CM | POA: Diagnosis not present

## 2022-09-11 DIAGNOSIS — E7849 Other hyperlipidemia: Secondary | ICD-10-CM | POA: Diagnosis not present

## 2022-09-11 DIAGNOSIS — I10 Essential (primary) hypertension: Secondary | ICD-10-CM | POA: Diagnosis present

## 2022-09-11 DIAGNOSIS — Z79899 Other long term (current) drug therapy: Secondary | ICD-10-CM | POA: Diagnosis not present

## 2022-09-11 DIAGNOSIS — R0600 Dyspnea, unspecified: Secondary | ICD-10-CM | POA: Diagnosis not present

## 2022-09-11 DIAGNOSIS — E785 Hyperlipidemia, unspecified: Secondary | ICD-10-CM | POA: Diagnosis present

## 2022-09-11 DIAGNOSIS — I249 Acute ischemic heart disease, unspecified: Secondary | ICD-10-CM | POA: Diagnosis present

## 2022-09-11 DIAGNOSIS — F32A Depression, unspecified: Secondary | ICD-10-CM | POA: Diagnosis present

## 2022-09-11 DIAGNOSIS — D751 Secondary polycythemia: Secondary | ICD-10-CM | POA: Diagnosis present

## 2022-09-11 DIAGNOSIS — H5462 Unqualified visual loss, left eye, normal vision right eye: Secondary | ICD-10-CM | POA: Diagnosis present

## 2022-09-11 DIAGNOSIS — F17290 Nicotine dependence, other tobacco product, uncomplicated: Secondary | ICD-10-CM | POA: Diagnosis present

## 2022-09-11 HISTORY — PX: LEFT HEART CATH AND CORONARY ANGIOGRAPHY: CATH118249

## 2022-09-11 HISTORY — PX: CORONARY STENT INTERVENTION: CATH118234

## 2022-09-11 LAB — COMPREHENSIVE METABOLIC PANEL
ALT: 86 U/L — ABNORMAL HIGH (ref 0–44)
AST: 61 U/L — ABNORMAL HIGH (ref 15–41)
Albumin: 3.5 g/dL (ref 3.5–5.0)
Alkaline Phosphatase: 75 U/L (ref 38–126)
Anion gap: 10 (ref 5–15)
BUN: 7 mg/dL (ref 6–20)
CO2: 27 mmol/L (ref 22–32)
Calcium: 8.9 mg/dL (ref 8.9–10.3)
Chloride: 99 mmol/L (ref 98–111)
Creatinine, Ser: 0.99 mg/dL (ref 0.61–1.24)
GFR, Estimated: >60 mL/min (ref >60)
Glucose, Bld: 108 mg/dL — ABNORMAL HIGH (ref 70–99)
Potassium: 3.7 mmol/L (ref 3.5–5.1)
Sodium: 136 mmol/L (ref 135–145)
Total Bilirubin: 1 mg/dL (ref 0.3–1.2)
Total Protein: 6.5 g/dL (ref 6.5–8.1)

## 2022-09-11 LAB — CBC
HCT: 47 % (ref 39.0–52.0)
Hemoglobin: 16 g/dL (ref 13.0–17.0)
MCH: 35.9 pg — ABNORMAL HIGH (ref 26.0–34.0)
MCHC: 34 g/dL (ref 30.0–36.0)
MCV: 105.4 fL — ABNORMAL HIGH (ref 80.0–100.0)
Platelets: 208 10*3/uL (ref 150–400)
RBC: 4.46 MIL/uL (ref 4.22–5.81)
RDW: 13.2 % (ref 11.5–15.5)
WBC: 5.1 10*3/uL (ref 4.0–10.5)
nRBC: 0 % (ref 0.0–0.2)

## 2022-09-11 LAB — POCT ACTIVATED CLOTTING TIME: Activated Clotting Time: 271 seconds

## 2022-09-11 LAB — HEMOGLOBIN A1C
Hgb A1c MFr Bld: 5.4 % (ref 4.8–5.6)
Mean Plasma Glucose: 108 mg/dL

## 2022-09-11 LAB — VITAMIN B12: Vitamin B-12: 172 pg/mL — ABNORMAL LOW (ref 180–914)

## 2022-09-11 LAB — FOLATE: Folate: 6.5 ng/mL (ref >5.9)

## 2022-09-11 LAB — HEPARIN LEVEL (UNFRACTIONATED): Heparin Unfractionated: 0.27 IU/mL — ABNORMAL LOW (ref 0.30–0.70)

## 2022-09-11 SURGERY — LEFT HEART CATH AND CORONARY ANGIOGRAPHY
Anesthesia: LOCAL

## 2022-09-11 MED ORDER — LABETALOL HCL 5 MG/ML IV SOLN: 10.0000 mg | INTRAVENOUS | Status: AC | PRN

## 2022-09-11 MED ORDER — SODIUM CHLORIDE 0.9% FLUSH
3.0000 mL | Freq: Two times a day (BID) | INTRAVENOUS | Status: DC
  Administered 2022-09-11 – 2022-09-12 (×2): 3 mL via INTRAVENOUS

## 2022-09-11 MED ORDER — HEPARIN SODIUM (PORCINE) 1000 UNIT/ML IJ SOLN
INTRAMUSCULAR | Status: AC
  Filled 2022-09-11: qty 10

## 2022-09-11 MED ORDER — IOHEXOL 350 MG/ML SOLN
INTRAVENOUS | Status: DC | PRN
  Administered 2022-09-11: 120 mL

## 2022-09-11 MED ORDER — TICAGRELOR 90 MG PO TABS
90.0000 mg | ORAL_TABLET | Freq: Two times a day (BID) | ORAL | Status: DC
  Administered 2022-09-11 – 2022-09-12 (×2): 90 mg via ORAL
  Filled 2022-09-11 (×2): qty 1

## 2022-09-11 MED ORDER — VERAPAMIL HCL 2.5 MG/ML IV SOLN
INTRAVENOUS | Status: AC
  Filled 2022-09-11: qty 2

## 2022-09-11 MED ORDER — FENTANYL CITRATE (PF) 100 MCG/2ML IJ SOLN
INTRAMUSCULAR | Status: AC
  Filled 2022-09-11: qty 2

## 2022-09-11 MED ORDER — NITROGLYCERIN 1 MG/10 ML FOR IR/CATH LAB
INTRA_ARTERIAL | Status: DC | PRN
  Administered 2022-09-11: 150 ug via INTRACORONARY

## 2022-09-11 MED ORDER — MIDAZOLAM HCL 2 MG/2ML IJ SOLN
INTRAMUSCULAR | Status: AC
  Filled 2022-09-11: qty 2

## 2022-09-11 MED ORDER — SODIUM CHLORIDE 0.9 % IV SOLN: INTRAVENOUS | Status: AC

## 2022-09-11 MED ORDER — MORPHINE SULFATE (PF) 2 MG/ML IV SOLN
2.0000 mg | INTRAVENOUS | Status: DC | PRN
  Administered 2022-09-11: 2 mg via INTRAVENOUS
  Filled 2022-09-11: qty 1

## 2022-09-11 MED ORDER — ACETAMINOPHEN 325 MG PO TABS
650.0000 mg | ORAL_TABLET | ORAL | Status: DC | PRN
  Administered 2022-09-11: 650 mg via ORAL
  Filled 2022-09-11: qty 2

## 2022-09-11 MED ORDER — TICAGRELOR 90 MG PO TABS
ORAL_TABLET | ORAL | Status: DC | PRN
  Administered 2022-09-11: 180 mg via ORAL

## 2022-09-11 MED ORDER — ONDANSETRON HCL 4 MG/2ML IJ SOLN: 4.0000 mg | Freq: Four times a day (QID) | INTRAMUSCULAR | Status: DC | PRN

## 2022-09-11 MED ORDER — FENTANYL CITRATE (PF) 100 MCG/2ML IJ SOLN
INTRAMUSCULAR | Status: DC | PRN
  Administered 2022-09-11: 25 ug via INTRAVENOUS

## 2022-09-11 MED ORDER — TICAGRELOR 90 MG PO TABS
ORAL_TABLET | ORAL | Status: AC
  Filled 2022-09-11: qty 1

## 2022-09-11 MED ORDER — NITROGLYCERIN 1 MG/10 ML FOR IR/CATH LAB
INTRA_ARTERIAL | Status: AC
  Filled 2022-09-11: qty 10

## 2022-09-11 MED ORDER — HEPARIN (PORCINE) IN NACL 1000-0.9 UT/500ML-% IV SOLN
INTRAVENOUS | Status: DC | PRN
  Administered 2022-09-11 (×2): 500 mL

## 2022-09-11 MED ORDER — ORAL CARE MOUTH RINSE: 15.0000 mL | OROMUCOSAL | Status: DC | PRN

## 2022-09-11 MED ORDER — HEPARIN SODIUM (PORCINE) 1000 UNIT/ML IJ SOLN
INTRAMUSCULAR | Status: DC | PRN
  Administered 2022-09-11: 1000 [IU] via INTRAVENOUS
  Administered 2022-09-11 (×2): 5000 [IU] via INTRAVENOUS

## 2022-09-11 MED ORDER — VERAPAMIL HCL 2.5 MG/ML IV SOLN
INTRAVENOUS | Status: DC | PRN
  Administered 2022-09-11: 10 mL via INTRA_ARTERIAL

## 2022-09-11 MED ORDER — ASPIRIN 81 MG PO CHEW
81.0000 mg | CHEWABLE_TABLET | Freq: Every day | ORAL | Status: DC
  Administered 2022-09-12: 81 mg via ORAL
  Filled 2022-09-11: qty 1

## 2022-09-11 MED ORDER — LOSARTAN POTASSIUM 25 MG PO TABS
25.0000 mg | ORAL_TABLET | Freq: Every day | ORAL | Status: DC
  Administered 2022-09-11: 25 mg via ORAL
  Filled 2022-09-11: qty 1

## 2022-09-11 MED ORDER — LIDOCAINE HCL (PF) 1 % IJ SOLN
INTRAMUSCULAR | Status: DC | PRN
  Administered 2022-09-11: 2 mL

## 2022-09-11 MED ORDER — LIDOCAINE HCL (PF) 1 % IJ SOLN
INTRAMUSCULAR | Status: AC
  Filled 2022-09-11: qty 30

## 2022-09-11 MED ORDER — SODIUM CHLORIDE 0.9% FLUSH: 3.0000 mL | INTRAVENOUS | Status: DC | PRN

## 2022-09-11 MED ORDER — HYDRALAZINE HCL 20 MG/ML IJ SOLN
10.0000 mg | INTRAMUSCULAR | Status: AC | PRN
  Administered 2022-09-11 (×2): 10 mg via INTRAVENOUS
  Filled 2022-09-11 (×2): qty 1

## 2022-09-11 MED ORDER — SODIUM CHLORIDE 0.9 % IV SOLN: 250.0000 mL | INTRAVENOUS | Status: DC | PRN

## 2022-09-11 MED ORDER — MIDAZOLAM HCL 2 MG/2ML IJ SOLN
INTRAMUSCULAR | Status: DC | PRN
  Administered 2022-09-11: 1 mg via INTRAVENOUS

## 2022-09-11 SURGICAL SUPPLY — 23 items
BALL SAPPHIRE NC24 2.50X8 (BALLOONS) ×1
BALLN SAPPHIRE 2.0X10 (BALLOONS) ×1
BALLOON SAPPHIRE 2.0X10 (BALLOONS) IMPLANT
BALLOON SAPPHIRE NC24 2.50X8 (BALLOONS) IMPLANT
BAND CMPR LRG ZPHR (HEMOSTASIS) ×1
BAND ZEPHYR COMPRESS 30 LONG (HEMOSTASIS) IMPLANT
CATH DIAG 6FR PIGTAIL ANGLED (CATHETERS) IMPLANT
CATH LAUNCHER 6FR EBU3.5 (CATHETERS) IMPLANT
CATH OPTITORQUE TIG 4.0 6F (CATHETERS) IMPLANT
GLIDESHEATH SLEND SS 6F .021 (SHEATH) IMPLANT
GUIDEWIRE VAS SION BLUE 190 (WIRE) IMPLANT
KIT ENCORE 26 ADVANTAGE (KITS) IMPLANT
KIT ESSENTIALS PG (KITS) IMPLANT
KIT HEART LEFT (KITS) ×1 IMPLANT
PACK CARDIAC CATHETERIZATION (CUSTOM PROCEDURE TRAY) ×1 IMPLANT
STENT SYNERGY XD 2.25X12 (Permanent Stent) IMPLANT
STOPCOCK MORSE 400PSI 3WAY (MISCELLANEOUS) IMPLANT
SYNERGY XD 2.25X12 (Permanent Stent) ×1 IMPLANT
SYR MEDRAD MARK 7 150ML (SYRINGE) ×1 IMPLANT
TRANSDUCER W/STOPCOCK (MISCELLANEOUS) ×1 IMPLANT
TUBING CIL FLEX 10 FLL-RA (TUBING) ×1 IMPLANT
TUBING CONTRAST HIGH PRESS 48 (TUBING) IMPLANT
WIRE EMERALD 3MM-J .035X260CM (WIRE) IMPLANT

## 2022-09-11 NOTE — Progress Notes (Signed)
Called to floor by RN to assess radial site post cath. Zephyr band off and RN holding manual pressure. Pt appeared distressed due to pain in right arm. Manual cuff placed on upper arm and inflated to 138mHg and took over for RN holding pressure. Site slightly oozing, hematoma level three, three cm in diameter extending lateral and proximal to the site. Held pressure for ten minutes total. Pt arm repositioned and elevated.    Zephyr band reapplied wih 10cc in band. Hematoma level one. Reverse barbeau level B. Reviewed with RN at bedside. Pt stated feeling much better. Re educated patient, and spouse, on radial access care/ guidelines. Vitals stable, please review chart.

## 2022-09-11 NOTE — Progress Notes (Addendum)
Rounding Note    Patient Name: Jeffrey Hodges Date of Encounter: 09/11/2022  [place] Cardiologist: Shelva Majestic, MD   Subjective   No chest pain. Planned for cardiac cath today.  Inpatient Medications    Scheduled Meds:  amLODipine  5 mg Oral Daily   aspirin EC  81 mg Oral Daily   atorvastatin  80 mg Oral Daily   busPIRone  10 mg Oral BID   FLUoxetine  40 mg Oral Daily   metoprolol tartrate  12.5 mg Oral BID   sodium chloride flush  3 mL Intravenous Q12H   Continuous Infusions:  sodium chloride     sodium chloride 1 mL/kg/hr (09/11/22 0616)   heparin 1,550 Units/hr (09/11/22 0744)   PRN Meds: sodium chloride, acetaminophen, LORazepam, nitroGLYCERIN, ondansetron (ZOFRAN) IV, mouth rinse, sodium chloride flush   Vital Signs    Vitals:   09/10/22 2200 09/10/22 2311 09/11/22 0508 09/11/22 0652  BP:  (!) 131/90 (!) 153/104 (!) 135/91  Pulse: 67 (!) 57 (!) 59 63  Resp:  19 18   Temp:  97.8 F (36.6 C) 98.5 F (36.9 C)   TempSrc:  Oral Oral   SpO2:  95% 97% 100%  Weight:      Height:        Intake/Output Summary (Last 24 hours) at 09/11/2022 0834 Last data filed at 09/11/2022 0615 Gross per 24 hour  Intake 819.41 ml  Output --  Net 819.41 ml      09/10/2022    8:28 PM 09/10/2022   10:04 AM 09/09/2022    2:01 PM  Last 3 Weights  Weight (lbs) 218 lb 14.4 oz 230 lb 226 lb 4 oz  Weight (kg) 99.292 kg 104.327 kg 102.626 kg      Telemetry    Sinus Rhythm - Personally Reviewed  ECG    Sinus Bradycardia, 59 bpm prolonged Qt, TWI in lead I, aVL, v1 - Personally Reviewed  Physical Exam   GEN: No acute distress.   Neck: No JVD Cardiac: RRR, no murmurs, rubs, or gallops.  Respiratory: Clear to auscultation bilaterally. GI: Soft, nontender, non-distended  MS: No edema; No deformity. Neuro:  Nonfocal  Psych: Normal affect   Labs    High Sensitivity Troponin:   Recent Labs  Lab 09/10/22 1005 09/10/22 1206 09/10/22 1515 09/10/22 1748   TROPONINIHS 1,226* 1,386* 1,190* 1,138*     Chemistry Recent Labs  Lab 09/09/22 1448 09/10/22 1005 09/11/22 0211  NA 138 137 136  K 4.1 4.0 3.7  CL 96 99 99  CO2 '30 25 27  '$ GLUCOSE 97 122* 108*  BUN '11 6 7  '$ CREATININE 0.97 1.04 0.99  CALCIUM 9.7 9.2 8.9  PROT 7.4  --  6.5  ALBUMIN 4.3  --  3.5  AST 63*  --  61*  ALT 95*  --  86*  ALKPHOS 98  --  75  BILITOT 0.6  --  1.0  GFRNONAA  --  >60 >60  ANIONGAP  --  13 10    Lipids  Recent Labs  Lab 09/10/22 1005  CHOL 301*  TRIG 82  HDL 78  LDLCALC 207*  CHOLHDL 3.9    Hematology Recent Labs  Lab 09/09/22 1448 09/10/22 1005 09/11/22 0211  WBC 7.2 6.4 5.1  RBC 4.72 4.84 4.46  HGB 17.2* 17.7* 16.0  HCT 50.1 50.2 47.0  MCV 106.2* 103.7* 105.4*  MCH  --  36.6* 35.9*  MCHC 34.4 35.3 34.0  RDW 13.9  13.1 13.2  PLT 239.0 239 208   Thyroid No results for input(s): "TSH", "FREET4" in the last 168 hours.  BNPNo results for input(s): "BNP", "PROBNP" in the last 168 hours.  DDimer No results for input(s): "DDIMER" in the last 168 hours.   Radiology    DG Chest 2 View  Result Date: 09/10/2022 CLINICAL DATA:  Chest pain, abnormal EKG EXAM: CHEST - 2 VIEW COMPARISON:  Chest radiograph 09/19/2006 FINDINGS: The cardiomediastinal silhouette is normal. There is no focal consolidation or pulmonary edema. There is no pleural effusion or pneumothorax There is no acute osseous abnormality. IMPRESSION: No radiographic evidence of acute cardiopulmonary process. Electronically Signed   By: Valetta Mole M.D.   On: 09/10/2022 10:54    Cardiac Studies   Echo: pending  Patient Profile     57 y.o. male  with a history of hypertension, hyperlipidemia, and anxiety/depression who is being seen 09/10/2022 for the evaluation of chest pain and NSTEMI.   Assessment & Plan    NSTEMI Patient was seen by PCP in the office and had a high-sensitivity troponin and urgent referral to Cardiology was placed. Troponin came back at 2,901 so he was  advised to come to the ED. In the ED, repeat troponin 1,226 >> 1,386. EKG showed normal sinus rhythm with T wave inversion in lead aVL. -- Continue IV Heparin, aspirin, metoprolol 12.5 mg twice daily, atorvastatin 80 mg daily -- Echo pending -- plan for cardiac catheterization today   Hypertension -- Blood pressure still elevated at times -- Continue metoprolol 12.5 mg twice daily, add losartan 25 mg daily.  Will stop amlodipine for now   Hyperlipidemia -- History of hyperlipidemia but is not on any medications.  -- LDL 207, HDL 78 -- Started on atorvastatin 80 mg daily, suspect may need for Repatha to be able to reach target goal  Anxiety -- Continue home medications:  Buspirone '10mg'$  daily, Prozac '40mg'$  daily, and PRN Ativan.  Elevated LFTs -- Noted to be elevated back in January -- AST 61, ALT 86 this admission -- Will need to monitor with initiation of statin therapy   Tobacco Use -- Patient has been vaping for the past 5 years and smoked cigarettes for 2 years prior to this.  -- Cessation advised   For questions or updates, please contact [place] Please consult [email or web address] for contact info under        Signed, Reino Bellis, NP  09/11/2022, 8:34 AM    Patient seen and examined. Agree with assessment and plan. No recurrent chest pain of shortness of breath.  BP  stable, HR in the 60s. Troponins 1226 > 1386 > 1190 >1138. Initiated atorvastatin, and may need PCSK9 inhibition with probable familial hyperlipidemia. Plan cath later today with possible PCI. Pt aware of risks/benefits of procedure.   Troy Sine, MD, [facility] 09/11/2022 9:49 AM

## 2022-09-11 NOTE — Plan of Care (Signed)
  Problem: Cardiovascular: Goal: Ability to achieve and maintain adequate cardiovascular perfusion will improve Outcome: Progressing Goal: Vascular access site(s) Level 0-1 will be maintained Outcome: Progressing   Problem: Health Behavior/Discharge Planning: Goal: Ability to safely manage health-related needs after discharge will improve Outcome: Progressing   Problem: Education: Goal: Knowledge of General Education information will improve Description: Including pain rating scale, medication(s)/side effects and non-pharmacologic comfort measures Outcome: Progressing   Problem: Pain Managment: Goal: General experience of comfort will improve Outcome: Progressing

## 2022-09-11 NOTE — Progress Notes (Signed)
    S:    Called to see pt r/t right radial hematoma s/p cath/PCI.  Eval by cath lab staff and TR band re-placed.  Currently c/o R wrist pain.  Also c/o difficultly getting a satisfying breath.  Recently dosed w/ brilinta.  No c/p.   O:   Vitals:   09/11/22 0916 09/11/22 1455  BP: (!) 122/93   Pulse: 62   Resp: 18   Temp: 98.2 F (36.8 C)   SpO2: 98% 96%   Pleasant, NAD, AAOx3.  Lungs CTA. Cor RRR, no murmurs. Abd soft, nt/nd/bs+x4. Ext R wrist w/ TR band in place.  Area is soft w/o bleeding/bruit.  No significant hematoma.   A/P:  1.  R radial hematoma:  s/p cath/PCI/DES to the D1 earlier today. Noted to have R radial hematoma and wrist pain following TR band removal.  TR band re-placed.  Area currently soft w/o bleeding, bruit.  TR band per nsg protocol.  Will use low dose morphine of acute site pain.  2.  Dyspnea:  pt w/ vague dyspnea and sense of intermittent inability to get a satisfying breath.  Lungs CTA.  VSS. RSR on tele.  No chest pain.  Symptoms started following brilinta dose at 15:25 - suspect brilinta side effect. RN provided caffeinated soft drink.  Follow.  Will need to consider switch to clopidogrel if ongoing symptoms following subsequent doses.  Discussed w/ pt and significant other at bedside.  Murray Hodgkins, NP  09/11/2022, 6:11 PM

## 2022-09-11 NOTE — Progress Notes (Signed)
After removal of compression band a hematoma formed. No bleeding note at insertion site. Manual pressure applied, charge nurse Cephus Richer, RN came to bedside and assisted with holding manual pressure so that we can notified cath lab - 2 cath lab RN came to bedside promptly.  Patient in significant pain with manual pressure - C. Sharolyn Douglas NP came to bedside to assess patient & order IV pain meds.

## 2022-09-11 NOTE — Care Management (Signed)
  Transition of Care (TOC) Screening Note   Patient Details  Name: Jeffrey Hodges Date of Birth: 04-11-1966   Transition of Care [facility]) CM/SW Contact:    Bethena Roys, RN Phone Number: 09/11/2022, 12:12 PM    Transition of Care Department [facility]) has reviewed the patient and no TOC needs have been identified at this time. Patient presented for chest pain- Nstemi-plan for left heart cath today. We will continue to monitor patient advancement through interdisciplinary progression rounds. If new patient transition needs arise, please place a TOC consult.

## 2022-09-11 NOTE — Interval H&P Note (Signed)
History and Physical Interval Note:  09/11/2022 8:20 AM  Jeffrey Hodges  has presented today for surgery, with the diagnosis of nstemi.  The various methods of treatment have been discussed with the patient and family. After consideration of risks, benefits and other options for treatment, the patient has consented to  Procedure(s): LEFT HEART CATH AND CORONARY ANGIOGRAPHY (N/A) as a surgical intervention.  The patient's history has been reviewed, patient examined, no change in status, stable for surgery.  I have reviewed the patient's chart and labs.  Questions were answered to the patient's satisfaction.    Cath Lab Visit (complete for each Cath Lab visit)  Clinical Evaluation Leading to the Procedure:   ACS: Yes.    Non-ACS:    Anginal Classification: CCS IV  Anti-ischemic medical therapy: Maximal Therapy (2 or more classes of medications)  Non-Invasive Test Results: No non-invasive testing performed  Prior CABG: No previous CABG        Early [place]

## 2022-09-11 NOTE — Progress Notes (Signed)
ANTICOAGULATION CONSULT NOTE  Pharmacy Consult for Heparin Indication: chest pain/ACS  No Known Allergies  Patient Measurements: Height: '6\' 1"'$  (185.4 cm) Weight: 99.3 kg (218 lb 14.4 oz) IBW/kg (Calculated) : 79.9 Heparin Dosing Weight: 101 kg  Vital Signs: Temp: 98.5 F (36.9 C) (02/28 0508) Temp Source: Oral (02/28 0508) BP: 135/91 (02/28 0652) Pulse Rate: 63 (02/28 0652)  Labs: Recent Labs    09/09/22 1448 09/09/22 1448 09/10/22 1005 09/10/22 1206 09/10/22 1515 09/10/22 1748 09/10/22 2013 09/11/22 0211  HGB 17.2*  --  17.7*  --   --   --   --  16.0  HCT 50.1  --  50.2  --   --   --   --  47.0  PLT 239.0  --  239  --   --   --   --  208  HEPARINUNFRC  --   --   --   --   --   --  0.26* 0.27*  CREATININE 0.97  --  1.04  --   --   --   --  0.99  TROPONINIHS  --    < > 1,226* 1,386* 1,190* 1,138*  --   --    < > = values in this interval not displayed.     Estimated Creatinine Clearance: 105.8 mL/min (by C-G formula based on SCr of 0.99 mg/dL).   Assessment: 24 YOM presenting with chest pain concerning for ACS. Not on anticoagulation PTA. Pharmacy consulted to dose heparin.   -Heparin level low 0.27 at 1400 units/hr. CBC stable. Plans for cardiac cath today  Goal of Therapy:  Heparin level 0.3-0.7 units/ml Monitor platelets by anticoagulation protocol: Yes   Plan:  Increase heparin infusion to 1550 units/hr Will follow plans post cath  Hildred Laser, PharmD Clinical Pharmacist **Pharmacist phone directory can now be found on [email or web address] ([email or web address]).  Listed under [place].

## 2022-09-12 ENCOUNTER — Inpatient Hospital Stay (HOSPITAL_COMMUNITY): Payer: BC Managed Care – PPO

## 2022-09-12 ENCOUNTER — Other Ambulatory Visit (HOSPITAL_COMMUNITY): Payer: Self-pay

## 2022-09-12 ENCOUNTER — Encounter (HOSPITAL_COMMUNITY): Payer: Self-pay | Admitting: Internal Medicine

## 2022-09-12 DIAGNOSIS — I214 Non-ST elevation (NSTEMI) myocardial infarction: Secondary | ICD-10-CM | POA: Diagnosis not present

## 2022-09-12 DIAGNOSIS — Z720 Tobacco use: Secondary | ICD-10-CM | POA: Insufficient documentation

## 2022-09-12 LAB — ECHOCARDIOGRAM COMPLETE
AR max vel: 3.29 cm2
AV Area VTI: 3.1 cm2
AV Area mean vel: 3 cm2
AV Mean grad: 4 mmHg
AV Peak grad: 7 mmHg
Ao pk vel: 1.32 m/s
Area-P 1/2: 3.48 cm2
Height: 73 in
S' Lateral: 3.5 cm
Weight: 3502.4 oz

## 2022-09-12 LAB — CBC
HCT: 45.2 % (ref 39.0–52.0)
Hemoglobin: 15.8 g/dL (ref 13.0–17.0)
MCH: 36.1 pg — ABNORMAL HIGH (ref 26.0–34.0)
MCHC: 35 g/dL (ref 30.0–36.0)
MCV: 103.2 fL — ABNORMAL HIGH (ref 80.0–100.0)
Platelets: 207 10*3/uL (ref 150–400)
RBC: 4.38 MIL/uL (ref 4.22–5.81)
RDW: 12.9 % (ref 11.5–15.5)
WBC: 5.8 10*3/uL (ref 4.0–10.5)
nRBC: 0 % (ref 0.0–0.2)

## 2022-09-12 LAB — BASIC METABOLIC PANEL
Anion gap: 9 (ref 5–15)
BUN: 10 mg/dL (ref 6–20)
CO2: 25 mmol/L (ref 22–32)
Calcium: 8.8 mg/dL — ABNORMAL LOW (ref 8.9–10.3)
Chloride: 100 mmol/L (ref 98–111)
Creatinine, Ser: 0.95 mg/dL (ref 0.61–1.24)
GFR, Estimated: >60 mL/min (ref >60)
Glucose, Bld: 116 mg/dL — ABNORMAL HIGH (ref 70–99)
Potassium: 3.5 mmol/L (ref 3.5–5.1)
Sodium: 134 mmol/L — ABNORMAL LOW (ref 135–145)

## 2022-09-12 MED ORDER — LOSARTAN POTASSIUM 50 MG PO TABS
50.0000 mg | ORAL_TABLET | Freq: Every day | ORAL | Status: DC
  Administered 2022-09-12: 50 mg via ORAL
  Filled 2022-09-12: qty 1

## 2022-09-12 MED ORDER — METOPROLOL TARTRATE 25 MG PO TABS
12.5000 mg | ORAL_TABLET | Freq: Two times a day (BID) | ORAL | 11 refills | Status: DC
  Filled 2022-09-12: qty 30, 30d supply, fill #0

## 2022-09-12 MED ORDER — LOSARTAN POTASSIUM 50 MG PO TABS
50.0000 mg | ORAL_TABLET | Freq: Every day | ORAL | 11 refills | Status: DC
  Filled 2022-09-12: qty 30, 30d supply, fill #0

## 2022-09-12 MED ORDER — ATORVASTATIN CALCIUM 80 MG PO TABS
80.0000 mg | ORAL_TABLET | Freq: Every day | ORAL | 3 refills | Status: DC
  Filled 2022-09-12: qty 90, 90d supply, fill #0

## 2022-09-12 MED ORDER — LOSARTAN POTASSIUM 25 MG PO TABS
25.0000 mg | ORAL_TABLET | Freq: Every day | ORAL | 0 refills | Status: DC
  Filled 2022-09-12: qty 90, 90d supply, fill #0

## 2022-09-12 MED ORDER — AMLODIPINE BESYLATE 2.5 MG PO TABS
2.5000 mg | ORAL_TABLET | Freq: Every day | ORAL | 2 refills | Status: DC
  Filled 2022-09-12: qty 30, 30d supply, fill #0

## 2022-09-12 MED ORDER — TICAGRELOR 90 MG PO TABS
90.0000 mg | ORAL_TABLET | Freq: Two times a day (BID) | ORAL | 11 refills | Status: DC
  Filled 2022-09-12: qty 60, 30d supply, fill #0

## 2022-09-12 MED ORDER — POTASSIUM CHLORIDE CRYS ER 20 MEQ PO TBCR
40.0000 meq | EXTENDED_RELEASE_TABLET | Freq: Once | ORAL | Status: AC
  Administered 2022-09-12: 40 meq via ORAL
  Filled 2022-09-12: qty 2

## 2022-09-12 MED ORDER — NITROGLYCERIN 0.4 MG SL SUBL
0.4000 mg | SUBLINGUAL_TABLET | SUBLINGUAL | 1 refills | Status: DC | PRN
  Filled 2022-09-12: qty 25, 7d supply, fill #0

## 2022-09-12 NOTE — Discharge Summary (Addendum)
Discharge Summary    Patient ID: Jeffrey Hodges MRN: [id number]; DOB: [birth year]  Admit date: 09/10/2022 Discharge date: 09/12/2022  PCP:  Michela Pitcher, NP   [place] Prov[facility] Cardiologist:  Shelva Majestic, MD    Discharge Diagnoses    Principal Problem:   NSTEMI (non-ST elevated myocardial infarction) [facility]) Active Problems:   Primary hypertension   Hyperlipidemia   Tobacco use  Diagnostic Studies/Procedures    Cath: 09/11/2022    1st Diag lesion is 85% stenosed.   Mid LAD lesion is 30% stenosed.   RPDA lesion is 40% stenosed.   A stent was successfully placed.   Post intervention, there is a 0% residual stenosis.   1.  High-grade and hazy first diagonal lesion; given lateral T wave inversions and anterolateral wall motion abnormality on ventriculography, this was treated with percutaneous coronary invention with 1 drug-eluting stent.  There was plaque shift into the ostium of the diagonal but due to the lack of chest pain and sustained TIMI-3 flow, further interventions were deferred. 2.  Mild obstructive coronary artery disease of the right, mid LAD, and left circumflex arteries. 3.  Ventriculography with ejection fraction of approximately 50% with anterolateral wall motion abnormality.   Commendation: Dual antiplatelet therapy for preferably 1 year and optimal medical therapy for cardiovascular disease.  Diagnostic Dominance: Right  Intervention   Echo: 09/12/2022  IMPRESSIONS     1. Left ventricular ejection fraction by 3D volume is 55 %. The left  ventricle has normal function. The left ventricle has no regional wall  motion abnormalities. Left ventricular diastolic parameters are consistent  with Grade I diastolic dysfunction  (impaired relaxation).   2. Right ventricular systolic function is normal. The right ventricular  size is normal.   3. The mitral valve is normal in structure. Mild mitral valve  regurgitation. No evidence of mitral  stenosis.   4. The aortic valve is normal in structure. Aortic valve regurgitation is  trivial. No aortic stenosis is present.   5. The inferior vena cava is normal in size with greater than 50%  respiratory variability, suggesting right atrial pressure of 3 mmHg.   FINDINGS   Left Ventricle: Left ventricular ejection fraction by 3D volume is 55 %.  The left ventricle has normal function. The left ventricle has no regional  wall motion abnormalities. The left ventricular internal cavity size was  normal in size. There is no left   ventricular hypertrophy. Left ventricular diastolic parameters are  consistent with Grade I diastolic dysfunction (impaired relaxation).   Right Ventricle: The right ventricular size is normal. No increase in  right ventricular wall thickness. Right ventricular systolic function is  normal.   Left Atrium: Left atrial size was normal in size.   Right Atrium: Right atrial size was normal in size.   Pericardium: There is no evidence of pericardial effusion. Presence of  epicardial fat layer.   Mitral Valve: The mitral valve is normal in structure. Mild mitral valve  regurgitation. No evidence of mitral valve stenosis.   Tricuspid Valve: The tricuspid valve is normal in structure. Tricuspid  valve regurgitation is trivial. No evidence of tricuspid stenosis.   Aortic Valve: The aortic valve is normal in structure. Aortic valve  regurgitation is trivial. No aortic stenosis is present. Aortic valve mean  gradient measures 4.0 mmHg. Aortic valve peak gradient measures 7.0 mmHg.  Aortic valve area, by VTI measures  3.10 cm.   Pulmonic Valve: The pulmonic valve was normal in  structure. Pulmonic valve  regurgitation is not visualized. No evidence of pulmonic stenosis.   Aorta: The aortic root is normal in size and structure.   Venous: The inferior vena cava is normal in size with greater than 50%  respiratory variability, suggesting right atrial pressure of  3 mmHg.   IAS/Shunts: No atrial level shunt detected by color flow Doppler.   _____________   History of Present Illness     Jeffrey Hodges is a 57 y.o. male with h a history of hypertension, hyperlipidemia, and anxiety/depression presented with chest pain and found to have a non-STEMI.   He has no known cardiac history. Patient was seen at [facility] office yesterday and reported chest pain that on evening of 09/09/2022. High-sensitivity troponin was ordered and urgent referral was placed to Cardiology. Troponin came back elevated at 2,901. He was advised to go to the ED for further evaluation. He presented to [facility] ED this morning. Upon arrival to the ED, he was mildly hypertensive. EKG showed normal sinus rhythm with T wave inversions in lead aVL. High-sensitivity troponin 1,226 >> 1,386. Chest x-ray showed no acute findings. WBC 6.4, Hgb 17.7, Plts 239. Na 137, K 4.0, Glucose 122, BUN 6, Cr 1.04. Patient was given 325 mg of Aspirin and started on IV Heparin. Cardiology was consulted.    At the time of this evaluation, patient is resting comfortably in no acute distress.  Patient was in his usual state of health until the evening 09/08/2022 when he started having some chest discomfort that he described as feeling like his heart was dropping into his stomach like he was unable to.  This started around 5 to 6 PM.  He was able to go to sleep with no issues but when he woke up the next day he was still having this pain.  His wife convinced him to go to his PCPs office who checked a troponin which came back elevated as above.  He states the pain yesterday lasted a couple hours and then resolved on its own.  He denies any other chest discomfort or pain.  He denies any radiation to his arm or neck and no associated symptoms. He denies any shortness of breath, orthopnea, PND, edema, palpitations, lightheadedness, dizziness, syncope. He stays active with his job - he handles all the HVAC work at [employer] and denies  any chest pain or shortness of breath with this work.    Patient has vaped for for the last 5 years and smoked cigarettes for 2 years prior to that. He denies any alcohol use or recreational drug use. He has no known family history of cardiovascular disease including MI, CHF, or stroke.  Hospital Course     NSTEMI Patient was seen by PCP in the office and had a high-sensitivity troponin and urgent referral to Cardiology was placed. Troponin came back at 2,901 so he was advised to come to the ED. In the ED, repeat troponin 1,226 >> 1,386. EKG showed normal sinus rhythm with T wave inversion in lead aVL.  Underwent cardiac catheterization noted above with 85% first diagonal lesion treated with PCI/DES x 1.  Mild nonobstructive disease in the RCA, mid LAD and circumflex to be treated medically.  Recommendations for DAPT with aspirin/Brilinta for at least 1 year. -- Continue aspirin, Brilinta, metoprolol XL 25 mg daily, losartan 25 mg daily, atorvastatin 80 mg daily -- Echo showed LVEF of [id number] grade 1 diastolic dysfunction, normal RV size and function, mild MR   Hypertension --  Continue metoprolol XL 25 mg daily, losartan 25 mg daily, amlodipine 2.'5mg'$  daily   Hyperlipidemia -- History of hyperlipidemia but is not on any medications.  -- LDL 207, HDL 78 -- Started on atorvastatin 80 mg daily, suspect may need for Repatha to be able to reach target goal   Anxiety -- Continue home medications:  Buspirone '10mg'$  daily, Prozac '40mg'$  daily, and PRN Ativan.   Elevated LFTs -- Noted to be elevated back in January -- AST 61, ALT 86 this admission -- Will need to monitor with initiation of statin therapy   Tobacco Use -- Patient has been vaping for the past 5 years and smoked cigarettes for 2 years prior to this.  -- Cessation advised  General: Well developed, well nourished, male appearing in no acute distress. Head: Normocephalic, atraumatic.  Neck: Supple without bruits, JVD. Lungs:  Resp regular  and unlabored, CTA. Heart: RRR, S1, S2, no S3, S4, or murmur; no rub. Abdomen: Soft, non-tender, non-distended with normoactive bowel sounds. No hepatomegaly. No rebound/guarding. No obvious abdominal masses. Extremities: No clubbing, cyanosis, edema. Distal pedal pulses are 2+ bilaterally. Right radial cath site stable without bruising or hematoma Neuro: Alert and oriented X 3. Moves all extremities spontaneously. Psych: Normal affect.   Did the patient have an acute coronary syndrome (MI, NSTEMI, STEMI, etc) this admission?:  Yes                               AHA/ACC Clinical Performance & Quality Measures: Aspirin prescribed? - Yes ADP Receptor Inhibitor (Plavix/Clopidogrel, Brilinta/Ticagrelor or Effient/Prasugrel) prescribed (includes medically managed patients)? - Yes Beta Blocker prescribed? - Yes High Intensity Statin (Lipitor 40-'80mg'$  or Crestor 20-'40mg'$ ) prescribed? - Yes EF assessed during THIS hospitalization? - Yes For EF <40%, was ACEI/ARB prescribed? - Yes For EF <40%, Aldosterone Antagonist (Spironolactone or Eplerenone) prescribed? - Not Applicable (EF >/= [id number]) Cardiac Rehab Phase II ordered (including medically managed patients)? - Yes       The patient will be scheduled for a TOC follow up appointment in 10-14 days.  A message has been sent to the [facility] and Scheduling Pool at the office where the patient should be seen for follow up.  _____________  Discharge Vitals Blood pressure (!) 155/88, pulse 66, temperature 98.2 F (36.8 C), temperature source Oral, resp. rate 19, height '6\' 1"'$  (1.854 m), weight 99.3 kg, SpO2 98 %.  Filed Weights   09/10/22 1004 09/10/22 2028  Weight: 104.3 kg 99.3 kg    Labs & Radiologic Studies    CBC Recent Labs    09/09/22 1448 09/10/22 1005 09/11/22 0211 09/12/22 0149  WBC 7.2   < > 5.1 5.8  NEUTROABS 5.7  --   --   --   HGB 17.2*   < > 16.0 15.8  HCT 50.1   < > 47.0 45.2  MCV 106.2*   < > 105.4* 103.2*  PLT 239.0   < >  208 207   < > = values in this interval not displayed.   Basic Metabolic Panel Recent Labs    09/11/22 0211 09/12/22 0149  NA 136 134*  K 3.7 3.5  CL 99 100  CO2 27 25  GLUCOSE 108* 116*  BUN 7 10  CREATININE 0.99 0.95  CALCIUM 8.9 8.8*   Liver Function Tests Recent Labs    09/09/22 1448 09/11/22 0211  AST 63* 61*  ALT 95* 86*  ALKPHOS 98 75  BILITOT 0.6 1.0  PROT 7.4 6.5  ALBUMIN 4.3 3.5   No results for input(s): "LIPASE", "AMYLASE" in the last 72 hours. High Sensitivity Troponin:   Recent Labs  Lab 09/10/22 1005 09/10/22 1206 09/10/22 1515 09/10/22 1748  TROPONINIHS 1,226* 1,386* 1,190* 1,138*    BNP Invalid input(s): "POCBNP" D-Dimer No results for input(s): "DDIMER" in the last 72 hours. Hemoglobin A1C Recent Labs    09/10/22 1005  HGBA1C 5.4   Fasting Lipid Panel Recent Labs    09/10/22 1005  CHOL 301*  HDL 78  LDLCALC 207*  TRIG 82  CHOLHDL 3.9   Thyroid Function Tests No results for input(s): "TSH", "T4TOTAL", "T3FREE", "THYROIDAB" in the last 72 hours.  Invalid input(s): "FREET3" _____________  ECHOCARDIOGRAM COMPLETE  Result Date: 09/12/2022    ECHOCARDIOGRAM REPORT   Patient Name:   Jeffrey Hodges Date of Exam: 09/12/2022 Medical Rec #:  [id number]     Height:       73.0 in Accession #:    [id number]    Weight:       218.9 lb Date of Birth:  [birth year]    BSA:          2.236 m Patient Age:    73 years      BP:           152/93 mmHg Patient Gender: M             HR:           56 bpm. Exam Location:  Inpatient Procedure: 2D Echo, Cardiac Doppler and Color Doppler Indications:    NSTEMI I21.4  History:        Patient has no prior history of Echocardiogram examinations.                 Previous Myocardial Infarction; Risk Factors:Hypertension and                 Dyslipidemia.  Sonographer:    Ronny Flurry Referring Phys: [id number] [place]  1. Left ventricular ejection fraction by 3D volume is 55 %. The left ventricle has  normal function. The left ventricle has no regional wall motion abnormalities. Left ventricular diastolic parameters are consistent with Grade I diastolic dysfunction (impaired relaxation).  2. Right ventricular systolic function is normal. The right ventricular size is normal.  3. The mitral valve is normal in structure. Mild mitral valve regurgitation. No evidence of mitral stenosis.  4. The aortic valve is normal in structure. Aortic valve regurgitation is trivial. No aortic stenosis is present.  5. The inferior vena cava is normal in size with greater than 50% respiratory variability, suggesting right atrial pressure of 3 mmHg. FINDINGS  Left Ventricle: Left ventricular ejection fraction by 3D volume is 55 %. The left ventricle has normal function. The left ventricle has no regional wall motion abnormalities. The left ventricular internal cavity size was normal in size. There is no left  ventricular hypertrophy. Left ventricular diastolic parameters are consistent with Grade I diastolic dysfunction (impaired relaxation). Right Ventricle: The right ventricular size is normal. No increase in right ventricular wall thickness. Right ventricular systolic function is normal. Left Atrium: Left atrial size was normal in size. Right Atrium: Right atrial size was normal in size. Pericardium: There is no evidence of pericardial effusion. Presence of epicardial fat layer. Mitral Valve: The mitral valve is normal in structure. Mild mitral valve regurgitation. No evidence of mitral valve stenosis. Tricuspid Valve: The tricuspid valve is  normal in structure. Tricuspid valve regurgitation is trivial. No evidence of tricuspid stenosis. Aortic Valve: The aortic valve is normal in structure. Aortic valve regurgitation is trivial. No aortic stenosis is present. Aortic valve mean gradient measures 4.0 mmHg. Aortic valve peak gradient measures 7.0 mmHg. Aortic valve area, by VTI measures 3.10 cm. Pulmonic Valve: The pulmonic valve  was normal in structure. Pulmonic valve regurgitation is not visualized. No evidence of pulmonic stenosis. Aorta: The aortic root is normal in size and structure. Venous: The inferior vena cava is normal in size with greater than 50% respiratory variability, suggesting right atrial pressure of 3 mmHg. IAS/Shunts: No atrial level shunt detected by color flow Doppler.  LEFT VENTRICLE PLAX 2D LVIDd:         5.20 cm         Diastology LVIDs:         3.50 cm         LV e' medial:    6.92 cm/s LV PW:         0.90 cm         LV E/e' medial:  11.0 LV IVS:        1.00 cm         LV e' lateral:   7.18 cm/s LVOT diam:     2.20 cm         LV E/e' lateral: 10.6 LV SV:         95 LV SV Index:   43 LVOT Area:     3.80 cm        3D Volume EF                                LV 3D EF:    Left                                             ventricul                                             ar                                             ejection                                             fraction                                             by 3D                                             volume is  55 %.                                 3D Volume EF:                                3D EF:        55 %                                LV EDV:       161 ml                                LV ESV:       72 ml                                LV SV:        88 ml RIGHT VENTRICLE             IVC RV S prime:     14.80 cm/s  IVC diam: 1.70 cm TAPSE (M-mode): 2.1 cm LEFT ATRIUM           Index        RIGHT ATRIUM           Index LA diam:      4.10 cm 1.83 cm/m   RA Area:     16.90 cm LA Vol (A2C): 55.5 ml 24.82 ml/m  RA Volume:   48.70 ml  21.78 ml/m LA Vol (A4C): 57.4 ml 25.67 ml/m  AORTIC VALVE AV Area (Vmax):    3.29 cm AV Area (Vmean):   3.00 cm AV Area (VTI):     3.10 cm AV Vmax:           132.00 cm/s AV Vmean:          93.000 cm/s AV VTI:            0.307 m AV Peak Grad:      7.0 mmHg AV Mean  Grad:      4.0 mmHg LVOT Vmax:         114.33 cm/s LVOT Vmean:        73.467 cm/s LVOT VTI:          0.250 m LVOT/AV VTI ratio: 0.82  AORTA Ao Root diam: 3.70 cm Ao Asc diam:  3.50 cm MITRAL VALVE MV Area (PHT): 3.48 cm    SHUNTS MV Decel Time: 218 msec    Systemic VTI:  0.25 m MV E velocity: 76.40 cm/s  Systemic Diam: 2.20 cm MV A velocity: 97.40 cm/s MV E/A ratio:  0.78 Kardie Tobb DO Electronically signed by Berniece Salines DO Signature Date/Time: 09/12/2022/10:16:20 AM    Final    CARDIAC CATHETERIZATION  Result Date: 09/11/2022   1st Diag lesion is 85% stenosed.   Mid LAD lesion is 30% stenosed.   RPDA lesion is 40% stenosed.   A stent was successfully placed.   Post intervention, there is a 0% residual stenosis. 1.  High-grade and hazy first diagonal lesion; given lateral T wave inversions and anterolateral wall motion abnormality on ventriculography, this was treated with percutaneous coronary invention with 1 drug-eluting stent.  There  was plaque shift into the ostium of the diagonal but due to the lack of chest pain and sustained TIMI-3 flow, further interventions were deferred. 2.  Mild obstructive coronary artery disease of the right, mid LAD, and left circumflex arteries. 3.  Ventriculography with ejection fraction of approximately 50% with anterolateral wall motion abnormality. Commendation: Dual antiplatelet therapy for preferably 1 year and optimal medical therapy for cardiovascular disease.   DG Chest 2 View  Result Date: 09/10/2022 CLINICAL DATA:  Chest pain, abnormal EKG EXAM: CHEST - 2 VIEW COMPARISON:  Chest radiograph 09/19/2006 FINDINGS: The cardiomediastinal silhouette is normal. There is no focal consolidation or pulmonary edema. There is no pleural effusion or pneumothorax There is no acute osseous abnormality. IMPRESSION: No radiographic evidence of acute cardiopulmonary process. Electronically Signed   By: Valetta Mole M.D.   On: 09/10/2022 10:54   Disposition   Pt is being  discharged home today in good condition.  Follow-up Plans & Appointments     Follow-up Information     Lenna Sciara, NP Follow up on 09/20/2022.   Specialties: Cardiology, Family Medicine Why: at 1:55pm for your follow up appt with Dr. Rachel Bo' NP Maris Berger information: [address] [place] [address] [phone]                Discharge Instructions     Amb Referral to Cardiac Rehabilitation   Complete by: As directed    Diagnosis:  Coronary Stents NSTEMI     After initial evaluation and assessments completed: Virtual Based Care may be provided alone or in conjunction with Phase 2 Cardiac Rehab based on patient barriers.: Yes   Intensive Cardiac Rehabilitation (ICR) [place] location only OR Traditional Cardiac Rehabilitation (TCR) *If criteria for ICR are not met will enroll in TCR [facility] only): Yes   Call MD for:  difficulty breathing, headache or visual disturbances   Complete by: As directed    Call MD for:  persistant dizziness or light-headedness   Complete by: As directed    Call MD for:  redness, tenderness, or signs of infection (pain, swelling, redness, odor or green/yellow discharge around incision site)   Complete by: As directed    Diet - low sodium heart healthy   Complete by: As directed    Discharge instructions   Complete by: As directed    Radial Site Care Refer to this sheet in the next few weeks. These instructions provide you with information on caring for yourself after your procedure. Your caregiver may also give you more specific instructions. Your treatment has been planned according to current medical practices, but problems sometimes occur. Call your caregiver if you have any problems or questions after your procedure. HOME CARE INSTRUCTIONS You may shower the day after the procedure. Remove the bandage (dressing) and gently wash the site with plain soap and water. Gently pat the site dry.  Do not apply powder or lotion to the  site.  Do not submerge the affected site in water for 3 to 5 days.  Inspect the site at least twice daily.  Do not flex or bend the affected arm for 24 hours.  No lifting over 5 pounds (2.3 kg) for 5 days after your procedure.  Do not drive home if you are discharged the same day of the procedure. Have someone else drive you.  You may drive 24 hours after the procedure unless otherwise instructed by your caregiver.  What to expect: Any bruising will usually fade within  1 to 2 weeks.  Blood that collects in the tissue (hematoma) may be painful to the touch. It should usually decrease in size and tenderness within 1 to 2 weeks.  SEEK IMMEDIATE MEDICAL CARE IF: You have unusual pain at the radial site.  You have redness, warmth, swelling, or pain at the radial site.  You have drainage (other than a small amount of blood on the dressing).  You have chills.  You have a fever or persistent symptoms for more than 72 hours.  You have a fever and your symptoms suddenly get worse.  Your arm becomes pale, cool, tingly, or numb.  You have heavy bleeding from the site. Hold pressure on the site.   PLEASE DO NOT MISS ANY DOSES OF YOUR BRILINTA!!!!! Also keep a log of you blood pressures and bring back to your follow up appt. Please call the office with any questions.   Patients taking blood thinners should generally stay away from medicines like ibuprofen, Advil, Motrin, naproxen, and Aleve due to risk of stomach bleeding. You may take Tylenol as directed or talk to your primary doctor about alternatives.  Some studies suggest Prilosec/Omeprazole interacts with Plavix. We changed your Prilosec/Omeprazole to the equivalent dose of Protonix for less chance of interaction.  PLEASE ENSURE THAT YOU DO NOT RUN OUT OF YOUR BRILINTA. This medication is very important to remain on for at least one year. IF you have issues obtaining this medication due to cost please CALL the office 3-5 business days prior to  running out in order to prevent missing doses of this medication.   Increase activity slowly   Complete by: As directed         Discharge Medications   Allergies as of 09/12/2022   No Known Allergies      Medication List     TAKE these medications    amLODipine 2.5 MG tablet Commonly known as: NORVASC Take 1 tablet (2.5 mg total) by mouth daily. What changed:  medication strength how much to take   aspirin EC 81 MG tablet Take 1 tablet (81 mg total) by mouth daily. Swallow whole. What changed: additional instructions   atorvastatin 80 MG tablet Commonly known as: LIPITOR Take 1 tablet (80 mg total) by mouth daily.   busPIRone 10 MG tablet Commonly known as: BUSPAR Take 1 tablet (10 mg total) by mouth 2 (two) times daily.   FLUoxetine 40 MG capsule Commonly known as: PROZAC Take 1 capsule (40 mg total) by mouth daily.   LORazepam 0.5 MG tablet Commonly known as: ATIVAN TAKE 1/2 - 1 TABLET (0.25 - 0.5 MG TOTAL) BY MOUTH TWICE A DAY AS NEEDED FOR ANXIETY What changed: See the new instructions.   losartan 25 MG tablet Commonly known as: COZAAR Take 1 tablet (25 mg total) by mouth daily.   metoprolol tartrate 25 MG tablet Commonly known as: LOPRESSOR Take 0.5 tablets (12.5 mg total) by mouth 2 (two) times daily.   nitroGLYCERIN 0.4 MG SL tablet Commonly known as: NITROSTAT Place 1 tablet (0.4 mg total) under the tongue every 5 (five) minutes x 3 doses as needed for chest pain.   ticagrelor 90 MG Tabs tablet Commonly known as: BRILINTA Take 1 tablet (90 mg total) by mouth 2 (two) times daily.        Outstanding Labs/Studies   FLP/LFTs in 8 weeks   Duration of Discharge Encounter   Greater than 30 minutes including physician time.  Signed, Reino Bellis, NP 09/12/2022, 10:53 AM  Patient seen and examined. Agree with assessment and plan.  No recurrent chest pain.  Currently getting 2D echo Doppler study.  Angiograms reviewed. Culprit lesion for  NSTEM was hazy proximal diagonal stenosis.  He underwent successful stenting.  However there is plaque shift with narrowing of the ostium of the diagonal proximal to the stented segment.  With his blood pressure elevation, will treat with beta-blocker, amlodipine, and reduce the losartan dose that was started yesterday.  I discussed with him the importance of DAPT for minimum of 12 months.  He works in [employer] and states he must return to work next week.  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  He was started on atorvastatin 80 mg for possible familial hyperlipidemia may need addition of Zetia and Repatha for optimal treatment.  Discharge later today   Troy Sine, MD, [facility] 09/12/2022 10:53 AM

## 2022-09-12 NOTE — Progress Notes (Signed)
CARDIAC REHAB PHASE I   PRE:  Rate/Rhythm: 66 SR   BP:  Sitting: 155/88      SaO2: 98 RA  MODE:  Ambulation: 470 ft   POST:  Rate/Rhythm: 78 SR   BP:  Sitting: 158/82      SaO2: 97 RA   Pt ambulated independently in hall. Tolerated well with no SOB or CP. Returned to bed with call bell and bedside table in reach. Pt reports on and off again mild SOB. Most likely related to Brilinta. Reports SOB symptoms seems to be decreasing today. Post MI/ stent education including site care, restrictions, risk factors, heart healthy diet, MI booklet, antiplatelet therapy importance, exercise guidelines, smoking cessation and CRP2 reviewed. All questions and concerns addressed. Will refer to [facility] for CRP2. Plan for home today.    [id number] Vanessa Barbara, RN BSN 09/12/2022 10:14 AM

## 2022-09-12 NOTE — TOC Benefit Eligibility Note (Signed)
Patient Jeffrey Hodges, English as a foreign language completed.    The patient is currently admitted and upon discharge could be taking Brilinta 90 mg.  The current 30 day co-pay is $47.00 Can use copay card.   The patient is insured through [place], [place] Patient [place] Patient Advocate Team Direct Number: [phone]  Fax: [phone]

## 2022-09-12 NOTE — Progress Notes (Signed)
Echocardiogram 2D Echocardiogram has been performed.  Ronny Flurry 09/12/2022, 9:25 AM

## 2022-09-12 NOTE — Progress Notes (Signed)
Right radial site cath level 1. Patient denied SOB after the second dose of Brilinta. Administered with caffeinated soft drinks.

## 2022-09-12 NOTE — Plan of Care (Signed)
  Problem: Education: Goal: Understanding of cardiac disease, CV risk reduction, and recovery process will improve Outcome: Progressing Goal: Individualized Educational Video(s) Outcome: Progressing   Problem: Activity: Goal: Ability to tolerate increased activity will improve Outcome: Progressing   Problem: Cardiac: Goal: Ability to achieve and maintain adequate cardiovascular perfusion will improve Outcome: Progressing    Problem: Cardiovascular: Goal: Ability to achieve and maintain adequate cardiovascular perfusion will improve Outcome: Progressing Goal: Vascular access site(s) Level 0-1 will be maintained Outcome: Progressing   Problem: Health Behavior/Discharge Planning: Goal: Ability to safely manage health-related needs after discharge will improve Outcome: Progressing   Problem: Education: Goal: Knowledge of General Education information will improve Description: Including pain rating scale, medication(s)/side effects and non-pharmacologic comfort measures Outcome: Progressing    Problem: Clinical Measurements: Goal: Ability to maintain clinical measurements within normal limits will improve Outcome: Progressing Goal: Will remain free from infection Outcome: Progressing Goal: Diagnostic test results will improve Outcome: Progressing Goal: Respiratory complications will improve Outcome: Progressing Goal: Cardiovascular complication will be avoided Outcome: Progressing   Problem: Activity: Goal: Risk for activity intolerance will decrease Outcome: Progressing   Problem: Nutrition: Goal: Adequate nutrition will be maintained Outcome: Progressing   Problem: Coping: Goal: Level of anxiety will decrease Outcome: Progressing   Problem: Elimination: Goal: Will not experience complications related to bowel motility Outcome: Progressing Goal: Will not experience complications related to urinary retention Outcome: Progressing   Problem: Pain Managment: Goal:  General experience of comfort will improve Outcome: Progressing   Problem: Safety: Goal: Ability to remain free from injury will improve Outcome: Progressing   Problem: Skin Integrity: Goal: Risk for impaired skin integrity will decrease Outcome: Progressing

## 2022-09-13 ENCOUNTER — Other Ambulatory Visit (HOSPITAL_COMMUNITY): Payer: Self-pay

## 2022-09-13 LAB — LIPOPROTEIN A (LPA): Lipoprotein (a): 68.6 nmol/L — ABNORMAL HIGH (ref <75.0)

## 2022-09-14 ENCOUNTER — Other Ambulatory Visit: Payer: Self-pay | Admitting: Nurse Practitioner

## 2022-09-16 ENCOUNTER — Telehealth: Payer: Self-pay

## 2022-09-16 NOTE — Transitions of Care (Post Inpatient/ED Visit) (Unsigned)
   09/16/2022  Name: Jeffrey Hodges MRN: [id number] DOB: [birth year]  Today's TOC FU Call Status: Today's TOC FU Call Status:: Unsuccessul Call (1st Attempt) Unsuccessful Call (1st Attempt) Date: 09/16/22  Attempted to reach the patient regarding the most recent Inpatient/ED visit.  Follow Up Plan: Additional outreach attempts will be made to reach the patient to complete the Transitions of Care (Post Inpatient/ED visit) call.   [place] LPN [place] Advisor Direct Dial [phone]

## 2022-09-17 NOTE — Transitions of Care (Post Inpatient/ED Visit) (Signed)
   09/17/2022  Name: Jeffrey Hodges MRN: [id number] DOB: 06-26-1966  Today's TOC FU Call Status: Today's TOC FU Call Status:: Unsuccessful Call (2nd Attempt) Unsuccessful Call (1st Attempt) Date: 09/16/22 Unsuccessful Call (2nd Attempt) Date: 09/17/22  Attempted to reach the patient regarding the most recent Inpatient/ED visit.  Follow Up Plan: No further outreach attempts will be made at this time. We have been unable to contact the patient.   Pt has fu with Diona Browner NP 09-20-22 [place] LPN [place] Advisor Direct Dial [phone]

## 2022-09-20 ENCOUNTER — Ambulatory Visit: Payer: BC Managed Care – PPO | Admitting: Clinical

## 2022-09-20 ENCOUNTER — Ambulatory Visit: Payer: BC Managed Care – PPO | Attending: Nurse Practitioner | Admitting: Nurse Practitioner

## 2022-09-20 ENCOUNTER — Other Ambulatory Visit: Payer: Self-pay

## 2022-09-20 ENCOUNTER — Encounter: Payer: Self-pay | Admitting: Nurse Practitioner

## 2022-09-20 VITALS — BP 132/84 | HR 54 | Ht 73.0 in | Wt 227.0 lb

## 2022-09-20 DIAGNOSIS — I10 Essential (primary) hypertension: Secondary | ICD-10-CM | POA: Diagnosis not present

## 2022-09-20 DIAGNOSIS — Z720 Tobacco use: Secondary | ICD-10-CM

## 2022-09-20 DIAGNOSIS — I2510 Atherosclerotic heart disease of native coronary artery without angina pectoris: Secondary | ICD-10-CM

## 2022-09-20 DIAGNOSIS — E785 Hyperlipidemia, unspecified: Secondary | ICD-10-CM

## 2022-09-20 DIAGNOSIS — R7989 Other specified abnormal findings of blood chemistry: Secondary | ICD-10-CM | POA: Diagnosis not present

## 2022-09-20 MED ORDER — LOSARTAN POTASSIUM 25 MG PO TABS: 25.0000 mg | ORAL_TABLET | Freq: Every day | ORAL | 3 refills | Status: DC

## 2022-09-20 MED ORDER — TICAGRELOR 90 MG PO TABS: 90.0000 mg | ORAL_TABLET | Freq: Two times a day (BID) | ORAL | 3 refills | Status: DC

## 2022-09-20 MED ORDER — METOPROLOL TARTRATE 25 MG PO TABS: 12.5000 mg | ORAL_TABLET | Freq: Two times a day (BID) | ORAL | 3 refills | Status: DC

## 2022-09-20 MED ORDER — ATORVASTATIN CALCIUM 80 MG PO TABS: 80.0000 mg | ORAL_TABLET | Freq: Every day | ORAL | 3 refills | Status: AC

## 2022-09-20 MED ORDER — ASPIRIN 81 MG PO TBEC: 81.0000 mg | DELAYED_RELEASE_TABLET | Freq: Every day | ORAL | 3 refills | Status: AC

## 2022-09-20 MED ORDER — AMLODIPINE BESYLATE 2.5 MG PO TABS: 2.5000 mg | ORAL_TABLET | Freq: Every day | ORAL | 3 refills | Status: DC

## 2022-09-20 MED ORDER — NITROGLYCERIN 0.4 MG SL SUBL: 0.4000 mg | SUBLINGUAL_TABLET | SUBLINGUAL | 3 refills | Status: AC | PRN

## 2022-09-20 NOTE — Progress Notes (Signed)
Office Visit    Patient Name: Jeffrey Hodges Date of Encounter: 09/20/2022  Primary Care Provider:  Michela Pitcher, NP Primary Cardiologist:  Shelva Majestic, MD  Chief Complaint   57 year old male with a history of CAD s/p NSTEMI, DES-D1 in 08/2022, hypertension, hyperlipidemia, elevated LFTs, anxiety, and tobacco use who presents for hospital follow-up related to CAD s/p NSTEMI.  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   Mid LAD lesion is 30% stenosed.   RPDA lesion is 40% stenosed.   A stent was successfully placed.   Post intervention, there is a 0% residual stenosis.   1.  High-grade and hazy first diagonal lesion; given lateral T wave inversions and anterolateral wall motion abnormality on ventriculography, this was treated with percutaneous coronary invention with 1 drug-eluting stent.  There was plaque shift into the ostium of the diagonal but due to the lack of chest pain and sustained TIMI-3 flow, further interventions were deferred. 2.  Mild obstructive coronary artery disease of the right, mid LAD, and left circumflex arteries. 3.  Ventriculography with ejection fraction of  approximately 50% with anterolateral wall motion abnormality.   Commendation: Dual antiplatelet therapy for preferably 1 year and optimal medical therapy for cardiovascular disease.  Echo: 09/12/2022   IMPRESSIONS     1. Left ventricular ejection fraction by 3D volume is 55 %. The left  ventricle has normal function. The left ventricle has no regional wall  motion abnormalities. Left ventricular diastolic parameters are consistent  with Grade I diastolic dysfunction  (impaired relaxation).   2. Right ventricular systolic function is normal. The right ventricular  size is normal.   3. The mitral valve is normal in structure. Mild mitral valve  regurgitation. No evidence of mitral stenosis.   4. The aortic valve is normal in structure. Aortic valve regurgitation is  trivial. No aortic stenosis is present.   5. The inferior vena cava is normal in size with greater than 50%  respiratory variability, suggesting right atrial pressure of 3 mmHg.   Recent Labs: 08/01/2022: TSH 0.75 09/11/2022: ALT 86 09/12/2022: BUN 10; Creatinine, Ser 0.95; Hemoglobin 15.8; Platelets 207; Potassium 3.5; Sodium 134  Recent Lipid Panel    Component Value Date/Time   CHOL 301 (H) 09/10/2022 1005   TRIG 82 09/10/2022 1005   HDL 78 09/10/2022 1005   CHOLHDL 3.9 09/10/2022 1005   VLDL 16 09/10/2022 1005   LDLCALC 207 (H) 09/10/2022 1005    History of Present Illness    57 year old male with the above past medical history including CAD s/p NSTEMI, DES-D1 in 08/2022, hypertension, hyperlipidemia, elevated LFTs, anxiety, and tobacco use.  He was seen by his PCP on 09/09/2022 with complaints of chest pain.  High-sensitivity troponin was ordered which came back elevated at 2901.  He was advised to go to the ED for further evaluation.  He presented to the ED on 227 through 24.  He was mildly hypertensive upon arrival.  EKG showed sinus rhythm with T wave inversions in aVL.  Repeat troponin was elevated at  1226>>1386.  Chest x-ray was unremarkable.  Cardiology was consulted.  He underwent cardiac catheterization on 09/11/2022 in the setting of NSTEMI which revealed 85% first diagonal lesion treated with DES x 1, otherwise mild nonobstructive disease in the RCA, mid LAD and circumflex, treated medically.  He was started on aspirin and Brilinta with metoprolol, losartan, amlodipine, and atorvastatin. He was discharged home in stable condition on 09/12/2022.  He presents today for follow-up. Since his hospitalization he has been stable from a cardiac standpoint.  He denies symptoms concerning for angina.  He has noted some mild shortness of breath after taking his Brilinta, this resolves with caffeine.  He continues to vape.  Overall, he reports feeling well.    Home Medications    Current Outpatient Medications  Medication Sig Dispense Refill   busPIRone (BUSPAR) 10 MG tablet Take 1 tablet (10 mg total) by mouth 2 (two) times daily. 60 tablet 1   FLUoxetine (PROZAC) 40 MG capsule TAKE 1 CAPSULE (40 MG TOTAL) BY MOUTH DAILY. 90 capsule 0   LORazepam (ATIVAN) 0.5 MG tablet TAKE 1/2 - 1 TABLET (0.25 - 0.5 MG TOTAL) BY MOUTH TWICE A DAY AS NEEDED FOR ANXIETY (Patient taking differently: Take 0.5 mg by mouth every 6 (six) hours as needed for anxiety.) 20 tablet 0   amLODipine (NORVASC) 2.5 MG tablet Take 1 tablet (2.5 mg total) by mouth daily. 90 tablet 3   aspirin EC 81 MG tablet Take 1 tablet (81 mg total) by mouth daily. Swallow whole. 90 tablet 3   atorvastatin (LIPITOR) 80 MG tablet Take 1 tablet (80 mg total) by mouth daily. 90 tablet 3   losartan (COZAAR) 25 MG tablet Take 1 tablet (25 mg total) by mouth daily. 90 tablet 3   metoprolol tartrate (LOPRESSOR) 25 MG tablet Take 0.5 tablets (12.5 mg total) by mouth 2 (two) times daily. 180 tablet 3   nitroGLYCERIN (NITROSTAT) 0.4 MG SL tablet Place 1 tablet (0.4 mg total) under the tongue every 5 (five) minutes x 3 doses as needed for chest pain. 25 tablet 3    ticagrelor (BRILINTA) 90 MG TABS tablet Take 1 tablet (90 mg total) by mouth 2 (two) times daily. 90 tablet 3   No current facility-administered medications for this visit.     Review of Systems    He denies chest pain, palpitations, dyspnea, pnd, orthopnea, n, v, dizziness, syncope, edema, weight gain, or early satiety. All other systems reviewed and are otherwise negative except as noted above.    Cardiac Rehabilitation Eligibility Assessment  The patient is ready to start cardiac rehabilitation from a cardiac standpoint.    Physical Exam    VS:  BP 132/84   Pulse (!) 54   Ht '6\' 1"'$  (1.854 m)   Wt 227 lb (103 kg)   SpO2 98%   BMI 29.95 kg/m  GEN: Well nourished, well developed, in no acute distress. HEENT: normal. Neck: Supple, no JVD, carotid bruits, or masses. Cardiac: RRR, no murmurs, rubs, or gallops. No clubbing, cyanosis, edema.  Radials/DP/PT 2+ and equal bilaterally.  Right radial  cath site with mild bruising, small hematoma, no bleeding, or bruit.   Respiratory:  Respirations regular and unlabored, clear to auscultation bilaterally. GI: Soft, nontender, nondistended, BS + x 4. MS: no deformity or atrophy. Skin: warm and dry, no rash. Neuro:  Strength and sensation are intact. Psych: Normal affect.  Accessory Clinical Findings    ECG personally reviewed by me today -sinus bradycardia, 54 bpm- no acute changes.   Lab Results  Component Value Date   WBC 5.8 09/12/2022   HGB 15.8 09/12/2022   HCT 45.2 09/12/2022   MCV 103.2 (H) 09/12/2022   PLT 207 09/12/2022   Lab Results  Component Value Date   CREATININE 0.95 09/12/2022   BUN 10 09/12/2022   NA 134 (L) 09/12/2022   K 3.5 09/12/2022   CL 100 09/12/2022   CO2 25 09/12/2022   Lab Results  Component Value Date   ALT 86 (H) 09/11/2022   AST 61 (H) 09/11/2022   ALKPHOS 75 09/11/2022   BILITOT 1.0 09/11/2022   Lab Results  Component Value Date   CHOL 301 (H) 09/10/2022   HDL 78 09/10/2022    LDLCALC 207 (H) 09/10/2022   TRIG 82 09/10/2022   CHOLHDL 3.9 09/10/2022    Lab Results  Component Value Date   HGBA1C 5.4 09/10/2022    Assessment & Plan    1. CAD: S/p NSTEMI, DES-D1 in 08/2022. EF was normal. Stable with no anginal symptoms. No indication for ischemic evaluation.  He has noted some mild shortness of breath after he takes his Brilinta, this resolves with caffeine.  I advised him to notify us if this becomes more bothersome.  Continue aspirin, Brilinta, amlodipine, losartan, metoprolol, and Lipitor.  2. Hypertension: BP well controlled. Continue current antihypertensive regimen.   3. Hyperlipidemia: LDL was 207 in 08/2022.  He was recently started on Lipitor 80 mg daily.  Will repeat fasting lipid panel, LFTs in 6 to 8 weeks.  If LDL remains elevated above goal, consider addition of Zetia, though he will likely need referral to lipid clinic Pharm.D. for consideration of PCSK9 inhibitor.  Continue Lipitor.  4. Elevated LFTs: Mildly elevated on most recent labs.  Repeat LFTs pending as above.  5. Tobacco use: Continues to vape.  Full cessation advised.  6. Disposition: Follow-up in 3 months.     Lenna Sciara, NP 09/20/2022, 3:52 PM

## 2022-09-20 NOTE — Patient Instructions (Signed)
Medication Instructions:  Your physician recommends that you continue on your current medications as directed. Please refer to the Current Medication list given to you today.   *If you need a refill on your cardiac medications before your next appointment, please call your pharmacy*   Lab Work: Your physician recommends that you return for lab work in 6-8 weeks. Fasting Lipid panel, LFTs  If you have labs (blood work) drawn today and your tests are completely normal, you will receive your results only by: MyChart Message (if you have MyChart) OR A paper copy in the mail If you have any lab test that is abnormal or we need to change your treatment, we will call you to review the results.   Testing/Procedures: NONE ordered at this time of appointment     Follow-Up: At [facility], you and your health needs are our priority.  As part of our continuing mission to provide you with exceptional heart care, we have created designated Provider Care Teams.  These Care Teams include your primary Cardiologist (physician) and Advanced Practice Providers (APPs -  Physician Assistants and Nurse Practitioners) who all work together to provide you with the care you need, when you need it.  We recommend signing up for the patient portal called "MyChart".  Sign up information is provided on this After Visit Summary.  MyChart is used to connect with patients for Virtual Visits (Telemedicine).  Patients are able to view lab/test results, encounter notes, upcoming appointments, etc.  Non-urgent messages can be sent to your provider as well.   To learn more about what you can do with MyChart, go to [email or web address].    Your next appointment:   3 month(s)  Provider:   Diona Browner, NP        Other Instructions

## 2022-09-22 ENCOUNTER — Other Ambulatory Visit: Payer: Self-pay | Admitting: Nurse Practitioner

## 2022-09-22 DIAGNOSIS — F411 Generalized anxiety disorder: Secondary | ICD-10-CM

## 2022-09-26 ENCOUNTER — Telehealth: Payer: Self-pay | Admitting: Cardiovascular Disease

## 2022-09-26 NOTE — Telephone Encounter (Signed)
Patient is calling back in regards to mychart message left. Requesting return call.

## 2022-09-26 NOTE — Telephone Encounter (Signed)
Spoke with pt regarding multiple mychart messages concerning paperwork for short term disability and returning to work. Per Raquel Sarna pt can return to work with no restrictions on Monday 3/18. Explained this to pt however he states that he is still not feeling himself and he is not sure what is wrong. Pt states that he is having issues with being tired and being extremely short of breath. Pt states this had been persistent since start brilinta. Pt states he was told that taking brilinta with a carbonated beverage could help with side effects however he states this is not helping with the shortness of breath that he is experiencing. Pt states that he doesn't believe that he is ready to return to work and would like to take more time off to recover and relax. Will route to provider to advise. Pt verbalizes understanding.

## 2022-09-26 NOTE — Telephone Encounter (Signed)
Patient has read  [employer]. Rn called to obtain further information. Left  detail message on voicemail to  contact office  by phone or mychart

## 2022-09-30 ENCOUNTER — Telehealth: Payer: Self-pay | Admitting: Cardiovascular Disease

## 2022-09-30 NOTE — Telephone Encounter (Signed)
Pt c/o medication issue:  1. Name of Medication:   ticagrelor (BRILINTA) 90 MG TABS tablet   2. How are you currently taking this medication (dosage and times per day)? As prescribed  3. Are you having a reaction (difficulty breathing--STAT)?   SOB which comes and goes  4. What is your medication issue?    Patient stated he is getting SOB when taking this medication.  Patient states medication is also making him nauseous and he wants an alternate medication.

## 2022-09-30 NOTE — Telephone Encounter (Signed)
Spoke to patient by telephone and was advised that he recently had a stent while in the hospital. Patient stated that they started him on a medication to prevent clotting. Patient stated that he has been sick on his stomach for about 3 weeks. Patient stated that he was told by the doctor at the hospital that the new medication to prevent clotting would cause nausea and it would take a while for his body to get use to it. Patient stated that he wants to change to another medication because of the nausea that it is causing him. Patient was advised that he needs to contact his cardiologist about changing his medication. Patient was advised that it appears that he has been in contact with his cardiologist office regarding the medication and the side effects. Patient stated that he will call and send a mychart message to his cardiologist again. See phone note to cardiology today from patient.

## 2022-09-30 NOTE — Telephone Encounter (Signed)
noted 

## 2022-09-30 NOTE — Telephone Encounter (Signed)
Returned call to patient, patient following up on switching from Brilinta to Plavix (see message from 3/14).    Advised message has been sent to Dr. Claiborne Billings to review, awaiting response.  Advised Dr. Claiborne Billings out of office today but will return Wednesday and will address asap.    Patient aware and verbalized understanding.

## 2022-10-01 ENCOUNTER — Encounter: Payer: Self-pay | Admitting: Nurse Practitioner

## 2022-10-01 ENCOUNTER — Ambulatory Visit: Payer: BC Managed Care – PPO | Admitting: Nurse Practitioner

## 2022-10-01 VITALS — BP 138/70 | HR 68 | Temp 98.1°F | Resp 16 | Ht 73.0 in | Wt 218.0 lb

## 2022-10-01 DIAGNOSIS — R61 Generalized hyperhidrosis: Secondary | ICD-10-CM

## 2022-10-01 DIAGNOSIS — R112 Nausea with vomiting, unspecified: Secondary | ICD-10-CM

## 2022-10-01 MED ORDER — ONDANSETRON 4 MG PO TBDP: 4.0000 mg | ORAL_TABLET | Freq: Three times a day (TID) | ORAL | 0 refills | Status: DC | PRN

## 2022-10-01 NOTE — Telephone Encounter (Signed)
If patient continues to experience shortness of breath after taking Brilinta can try switching him to Plavix.  Would load with 300 mg then 75 mg daily daily.  However, patient was noted to have plaque shift to the ostium of his diagonal vessel at the time of his catheterization by Dr. Ali Lowe

## 2022-10-01 NOTE — Telephone Encounter (Signed)
Called and spoke to wife on dpr. Patient started vomiting on Sunday. Started vomiting pink blood today. Wasn't a lot per patient. States it was mixed in with mucus. States patient has been vomiting more than 10 times a day. Has not been eating much at all. Has been drinking 3-4 160z bottles of water a day. Denies any abdominal pain. Has been having a lot of nasua but no dizziness. Denies any cough, congestion, or headache. No changes in medications from last hospital visit in February. Reviewed red words with patient. Feels like its just a stomach bug. Was just concerned with blood when he seen it.

## 2022-10-01 NOTE — Assessment & Plan Note (Signed)
Ambiguous in nature no sick contacts.  Will check basic labs today

## 2022-10-01 NOTE — Progress Notes (Signed)
Established Patient Office Visit  Subjective   Patient ID: Jeffrey Hodges, male    DOB: [birth year]  Age: 57 y.o. MRN: [id number]  Chief Complaint  Patient presents with   Night Sweats    Started Sunday   Emesis    Since Sunday can't keep down water. Seen blood in vomit this am.      Emesis and night sweats:  Symptoms started on Sunday. States that he is waking up vomiting. States that he is having night sweats. States that he is not able fluid or food. States that he is vomiting 10 plus times a day since Sunday  States that he has not had any sick contacts  Spouse and kids are not sick.  They are concerned specimen to with a prescription was recently placed in the patient's heart.  He denies any new shortness of breath and has not had any chest pain nor has been using nitroglycerin since discharge States that he is vomited twice this morning the first time had streaks of blood in the second and none in it.   Review of Systems  Constitutional:  Positive for malaise/fatigue. Negative for chills and fever.  HENT:  Negative for ear discharge, ear pain and sore throat.   Respiratory:  Positive for cough (baseline not new) and shortness of breath (same since stenting and brilinta start). Negative for sputum production.   Cardiovascular:  Negative for chest pain.  Gastrointestinal:  Positive for nausea and vomiting. Negative for abdominal pain, blood in stool, constipation and diarrhea.  Musculoskeletal:  Negative for joint pain and myalgias.  Neurological:  Negative for headaches.      Objective:     BP 138/70   Pulse 68   Temp 98.1 F (36.7 C)   Resp 16   Ht 6\' 1"  (1.854 m)   Wt 218 lb (98.9 kg)   SpO2 98%   BMI 28.76 kg/m    Physical Exam Vitals and nursing note reviewed.  Constitutional:      Appearance: Normal appearance.  HENT:     Right Ear: Tympanic membrane, ear canal and external ear normal.     Left Ear: Tympanic membrane, ear canal and external ear  normal.     Mouth/Throat:     Mouth: Mucous membranes are moist.     Pharynx: Oropharynx is clear.  Cardiovascular:     Rate and Rhythm: Normal rate and regular rhythm.     Pulses: Normal pulses.     Heart sounds: Normal heart sounds.  Pulmonary:     Effort: Pulmonary effort is normal.     Breath sounds: Normal breath sounds.  Abdominal:     General: Bowel sounds are normal. There is no distension.     Palpations: There is no mass.     Tenderness: There is no abdominal tenderness.     Hernia: No hernia is present.  Lymphadenopathy:     Cervical: No cervical adenopathy.  Neurological:     Mental Status: He is alert.      No results found for any visits on 10/01/22.    The ASCVD Risk score (Arnett DK, et al., 2019) failed to calculate for the following reasons:   The patient has a prior MI or stroke diagnosis    Assessment & Plan:   Problem List Items Addressed This Visit       Digestive   Nausea and vomiting - Primary    Exam benign.  Will do ondansetron 4 mg ODT 3  times daily as needed.  Patient will try liquids and then advance diet slowly with a brat/bland diet.  Check labs inclusive of CBC, CMP and lipase.      Relevant Medications   ondansetron (ZOFRAN-ODT) 4 MG disintegrating tablet   Other Relevant Orders   CBC   Lipase   Comprehensive metabolic panel     Other   Night sweats    Ambiguous in nature no sick contacts.  Will check basic labs today      Relevant Orders   CBC   Comprehensive metabolic panel    Return if symptoms worsen or fail to improve, for As scheduled .    Romilda Garret, NP

## 2022-10-01 NOTE — Assessment & Plan Note (Signed)
Exam benign.  Will do ondansetron 4 mg ODT 3 times daily as needed.  Patient will try liquids and then advance diet slowly with a brat/bland diet.  Check labs inclusive of CBC, CMP and lipase.

## 2022-10-01 NOTE — Telephone Encounter (Signed)
Called patient - scheduled for today.

## 2022-10-01 NOTE — Patient Instructions (Signed)
Nice to see you today I have sent in medication to the pharmacy to help with the nausea and vomiting Try and get fluids in if you can keep them down Once that happens work on a [employer] and advance it slowly

## 2022-10-01 NOTE — Telephone Encounter (Signed)
I am ok seeing him in office.

## 2022-10-01 NOTE — Addendum Note (Signed)
Addended by: Ellamae Sia on: 10/01/2022 02:35 PM   Modules accepted: Orders

## 2022-10-02 NOTE — Telephone Encounter (Signed)
See duplicate phone note 

## 2022-10-03 ENCOUNTER — Other Ambulatory Visit: Payer: Self-pay | Admitting: Internal Medicine

## 2022-10-03 NOTE — Telephone Encounter (Signed)
Called patient, LVM to call back.  Left call back number.   

## 2022-10-04 ENCOUNTER — Other Ambulatory Visit: Payer: Self-pay

## 2022-10-04 MED ORDER — CLOPIDOGREL BISULFATE 75 MG PO TABS: 75.0000 mg | ORAL_TABLET | Freq: Every day | ORAL | 3 refills | Status: DC

## 2022-10-04 MED ORDER — CLOPIDOGREL BISULFATE 75 MG PO TABS: ORAL_TABLET | ORAL | 0 refills | Status: DC

## 2022-10-04 NOTE — Telephone Encounter (Signed)
Noted. Plavix starting dose and Plavix 75 mg daily was sent into pts pharmacy. Lmom to inform pt of the medication change.

## 2022-10-07 ENCOUNTER — Other Ambulatory Visit: Payer: Self-pay | Admitting: Nurse Practitioner

## 2022-10-07 DIAGNOSIS — I10 Essential (primary) hypertension: Secondary | ICD-10-CM

## 2022-10-07 NOTE — Telephone Encounter (Signed)
Patient was advised of medication change, see previous notes.  Thanks!

## 2022-10-07 NOTE — Telephone Encounter (Signed)
I left a message for pt to contact me back Friday with information about the med change per Dr. Claiborne Billings and Diona Browner NP. I will contact pt again. Thanks

## 2022-10-18 ENCOUNTER — Encounter (HOSPITAL_COMMUNITY): Payer: Self-pay

## 2022-10-19 ENCOUNTER — Other Ambulatory Visit: Payer: Self-pay | Admitting: Nurse Practitioner

## 2022-11-18 ENCOUNTER — Telehealth (HOSPITAL_COMMUNITY): Payer: Self-pay

## 2022-11-18 NOTE — Telephone Encounter (Signed)
No response from pt.  Closed referral  

## 2022-12-02 ENCOUNTER — Encounter: Payer: Self-pay | Admitting: Nurse Practitioner

## 2022-12-03 NOTE — Telephone Encounter (Signed)
Appointment canceled per pt request.

## 2022-12-06 ENCOUNTER — Encounter: Payer: BC Managed Care – PPO | Admitting: Nurse Practitioner

## 2022-12-16 ENCOUNTER — Ambulatory Visit: Payer: BC Managed Care – PPO | Admitting: Nurse Practitioner

## 2022-12-17 ENCOUNTER — Other Ambulatory Visit: Payer: Self-pay | Admitting: Nurse Practitioner

## 2023-01-08 ENCOUNTER — Other Ambulatory Visit: Payer: Self-pay | Admitting: Nurse Practitioner

## 2023-02-03 ENCOUNTER — Other Ambulatory Visit: Payer: Self-pay | Admitting: Nurse Practitioner

## 2023-03-08 ENCOUNTER — Encounter: Payer: Self-pay | Admitting: Nurse Practitioner

## 2023-03-12 ENCOUNTER — Ambulatory Visit: Payer: Self-pay | Admitting: Nurse Practitioner

## 2023-03-21 ENCOUNTER — Ambulatory Visit: Payer: Self-pay | Admitting: Nurse Practitioner

## 2023-03-24 NOTE — Telephone Encounter (Signed)
Last seen in office in March  for acute issue. He cancelled hs follow up due to lack of insurance. You need to see in office to make change in meds right?

## 2023-03-24 NOTE — Telephone Encounter (Signed)
Daleen Snook, patient's spouse on the phone to relate that the Mr. Pierpoint is not taking   metoprolol tartrate (LOPRESSOR) 25 MG tablet  Advised Lafonda Mosses that she is not on the [facility] and that Mr. Martines needs to update it at his appointment on Tuesday 9.10.24

## 2023-03-24 NOTE — Telephone Encounter (Signed)
Karie Schwalbe, MD  to Me     03/24/23  1:22 PM I can review all this at tomorrow's visit If his BP is still running low, holding the metoprolol is a good idea  Unable to  reach pt by phone but I was able to speak with [name] pts wife and DPR signed and she was notified as instructed by Dr Alphonsus Sias and Jennette Kettle voiced understanding and she said pt is presently out of metoprolol and pt will discuss if needs to take metoprolol getting a refill when seen if Dr Alphonsus Sias thinks needed. Pt will bring all med bottles with him to the 03/25/23 visit.sending note to Dr Alphonsus Sias as Lorain Childes.

## 2023-03-24 NOTE — Telephone Encounter (Signed)
I would like to see him in office. We can also triage him to get more information I assume he means he is getting systolic's in the 80s?

## 2023-03-24 NOTE — Telephone Encounter (Signed)
I spoke with pt; pt said last few days BP has been 80/70. Now BP 98/75 P104; pt has not taken BP med today. Pt said few wks ago he passed out and fell down steps last wk due to dizziness. Room spins on and off. No dizziness so far today. No H/A or CP. Pt does have some SOB intermittently with any exertion. Pt said his vision has been blurred but not sure when started. Pt said has lt leg cramping on and off and that started about 4 months ago but no difficulty in walking.  Pt said lower BP started about 1 wk ago. Pt has been taking amlodipine 2.5 mg daily, losartan 25 mg daily and metoprolol 25 mg taking 1/2 tab bid. I spoke with Audria Nine NP who advised pt could hold amlodipine 2.5 mg until seen, pt can take losartan 25 mg daily and metoprolol 25 mg taking 1/2 tab bid. Pt needs to stay hydrated. Pt voiced understanding and pt said could come [facility] for appt. Pt scheduled appt with Dr Alphonsus Sias on 03/25/23 at 12;30 pm with UC & ED precautions and pt voiced understanding. Sending note to Dr Alphonsus Sias and Lorain Childes to Audria Nine NP as PCP.

## 2023-03-24 NOTE — Telephone Encounter (Signed)
Noted  

## 2023-03-25 ENCOUNTER — Ambulatory Visit: Payer: Self-pay | Admitting: Internal Medicine

## 2023-04-02 ENCOUNTER — Observation Stay (HOSPITAL_COMMUNITY)
Admission: EM | Admit: 2023-04-02 | Discharge: 2023-04-04 | Disposition: A | Discharge status: Home / Self Care | Payer: BC Managed Care – PPO | Attending: Student in an Organized Health Care Education/Training Program | Admitting: Student in an Organized Health Care Education/Training Program

## 2023-04-02 ENCOUNTER — Observation Stay (HOSPITAL_COMMUNITY): Payer: BC Managed Care – PPO

## 2023-04-02 ENCOUNTER — Emergency Department (HOSPITAL_COMMUNITY): Payer: BC Managed Care – PPO

## 2023-04-02 ENCOUNTER — Other Ambulatory Visit: Payer: Self-pay

## 2023-04-02 ENCOUNTER — Encounter (HOSPITAL_COMMUNITY): Payer: Self-pay | Admitting: Emergency Medicine

## 2023-04-02 ENCOUNTER — Telehealth: Payer: Self-pay | Admitting: Nurse Practitioner

## 2023-04-02 ENCOUNTER — Ambulatory Visit: Payer: Self-pay | Admitting: Primary Care

## 2023-04-02 DIAGNOSIS — Z7902 Long term (current) use of antithrombotics/antiplatelets: Secondary | ICD-10-CM | POA: Diagnosis not present

## 2023-04-02 DIAGNOSIS — R748 Abnormal levels of other serum enzymes: Secondary | ICD-10-CM | POA: Diagnosis not present

## 2023-04-02 DIAGNOSIS — I2510 Atherosclerotic heart disease of native coronary artery without angina pectoris: Secondary | ICD-10-CM | POA: Insufficient documentation

## 2023-04-02 DIAGNOSIS — Z8679 Personal history of other diseases of the circulatory system: Secondary | ICD-10-CM

## 2023-04-02 DIAGNOSIS — Z23 Encounter for immunization: Secondary | ICD-10-CM | POA: Diagnosis not present

## 2023-04-02 DIAGNOSIS — R4182 Altered mental status, unspecified: Secondary | ICD-10-CM | POA: Diagnosis not present

## 2023-04-02 DIAGNOSIS — Z7982 Long term (current) use of aspirin: Secondary | ICD-10-CM | POA: Insufficient documentation

## 2023-04-02 DIAGNOSIS — D649 Anemia, unspecified: Secondary | ICD-10-CM | POA: Diagnosis not present

## 2023-04-02 DIAGNOSIS — R7401 Elevation of levels of liver transaminase levels: Secondary | ICD-10-CM | POA: Diagnosis not present

## 2023-04-02 DIAGNOSIS — Z79899 Other long term (current) drug therapy: Secondary | ICD-10-CM | POA: Diagnosis not present

## 2023-04-02 DIAGNOSIS — S0990XA Unspecified injury of head, initial encounter: Secondary | ICD-10-CM | POA: Diagnosis not present

## 2023-04-02 DIAGNOSIS — K760 Fatty (change of) liver, not elsewhere classified: Secondary | ICD-10-CM | POA: Diagnosis not present

## 2023-04-02 DIAGNOSIS — M50222 Other cervical disc displacement at C5-C6 level: Secondary | ICD-10-CM | POA: Diagnosis not present

## 2023-04-02 DIAGNOSIS — F419 Anxiety disorder, unspecified: Secondary | ICD-10-CM | POA: Diagnosis not present

## 2023-04-02 DIAGNOSIS — Z955 Presence of coronary angioplasty implant and graft: Secondary | ICD-10-CM | POA: Diagnosis not present

## 2023-04-02 DIAGNOSIS — D696 Thrombocytopenia, unspecified: Secondary | ICD-10-CM

## 2023-04-02 DIAGNOSIS — R42 Dizziness and giddiness: Secondary | ICD-10-CM

## 2023-04-02 DIAGNOSIS — R55 Syncope and collapse: Principal | ICD-10-CM | POA: Diagnosis present

## 2023-04-02 DIAGNOSIS — F32A Depression, unspecified: Secondary | ICD-10-CM

## 2023-04-02 DIAGNOSIS — S199XXA Unspecified injury of neck, initial encounter: Secondary | ICD-10-CM | POA: Diagnosis not present

## 2023-04-02 DIAGNOSIS — F17290 Nicotine dependence, other tobacco product, uncomplicated: Secondary | ICD-10-CM | POA: Diagnosis not present

## 2023-04-02 DIAGNOSIS — R0602 Shortness of breath: Secondary | ICD-10-CM | POA: Diagnosis not present

## 2023-04-02 DIAGNOSIS — I10 Essential (primary) hypertension: Secondary | ICD-10-CM | POA: Diagnosis present

## 2023-04-02 DIAGNOSIS — R932 Abnormal findings on diagnostic imaging of liver and biliary tract: Secondary | ICD-10-CM | POA: Diagnosis not present

## 2023-04-02 DIAGNOSIS — R531 Weakness: Secondary | ICD-10-CM | POA: Diagnosis not present

## 2023-04-02 DIAGNOSIS — N179 Acute kidney failure, unspecified: Secondary | ICD-10-CM | POA: Diagnosis not present

## 2023-04-02 DIAGNOSIS — R7989 Other specified abnormal findings of blood chemistry: Secondary | ICD-10-CM | POA: Diagnosis not present

## 2023-04-02 HISTORY — DX: Atherosclerotic heart disease of native coronary artery without angina pectoris: I25.10

## 2023-04-02 LAB — POC OCCULT BLOOD, ED: Fecal Occult Bld: NEGATIVE

## 2023-04-02 LAB — BASIC METABOLIC PANEL WITH GFR
Anion gap: 17 — ABNORMAL HIGH (ref 5–15)
BUN: 13 mg/dL (ref 6–20)
CO2: 18 mmol/L — ABNORMAL LOW (ref 22–32)
Calcium: 8.3 mg/dL — ABNORMAL LOW (ref 8.9–10.3)
Chloride: 97 mmol/L — ABNORMAL LOW (ref 98–111)
Creatinine, Ser: 2.5 mg/dL — ABNORMAL HIGH (ref 0.61–1.24)
GFR, Estimated: 30 mL/min — ABNORMAL LOW (ref >60)
Glucose, Bld: 120 mg/dL — ABNORMAL HIGH (ref 70–99)
Potassium: 3.9 mmol/L (ref 3.5–5.1)
Sodium: 132 mmol/L — ABNORMAL LOW (ref 135–145)

## 2023-04-02 LAB — URINALYSIS, ROUTINE W REFLEX MICROSCOPIC
Bilirubin Urine: NEGATIVE
Glucose, UA: NEGATIVE mg/dL
Ketones, ur: NEGATIVE mg/dL
Leukocytes,Ua: NEGATIVE
Nitrite: NEGATIVE
Protein, ur: 100 mg/dL — AB
Specific Gravity, Urine: 1.015 (ref 1.005–1.030)
pH: 5 (ref 5.0–8.0)

## 2023-04-02 LAB — CK: Total CK: 483 U/L — ABNORMAL HIGH (ref 49–397)

## 2023-04-02 LAB — TROPONIN I (HIGH SENSITIVITY)
Troponin I (High Sensitivity): 61 ng/L — ABNORMAL HIGH (ref <18)
Troponin I (High Sensitivity): 71 ng/L — ABNORMAL HIGH (ref <18)
Troponin I (High Sensitivity): 84 ng/L — ABNORMAL HIGH (ref <18)

## 2023-04-02 LAB — CBC
HCT: 34.9 % — ABNORMAL LOW (ref 39.0–52.0)
Hemoglobin: 12.3 g/dL — ABNORMAL LOW (ref 13.0–17.0)
MCH: 34.8 pg — ABNORMAL HIGH (ref 26.0–34.0)
MCHC: 35.2 g/dL (ref 30.0–36.0)
MCV: 98.9 fL (ref 80.0–100.0)
Platelets: 117 10*3/uL — ABNORMAL LOW (ref 150–400)
RBC: 3.53 MIL/uL — ABNORMAL LOW (ref 4.22–5.81)
RDW: 17.5 % — ABNORMAL HIGH (ref 11.5–15.5)
WBC: 5.2 10*3/uL (ref 4.0–10.5)
nRBC: 0 % (ref 0.0–0.2)

## 2023-04-02 LAB — HEPATIC FUNCTION PANEL
ALT: 202 U/L — ABNORMAL HIGH (ref 0–44)
AST: 408 U/L — ABNORMAL HIGH (ref 15–41)
Albumin: 2.3 g/dL — ABNORMAL LOW (ref 3.5–5.0)
Alkaline Phosphatase: 577 U/L — ABNORMAL HIGH (ref 38–126)
Bilirubin, Direct: 1.8 mg/dL — ABNORMAL HIGH (ref 0.0–0.2)
Indirect Bilirubin: 2 mg/dL — ABNORMAL HIGH (ref 0.3–0.9)
Total Bilirubin: 3.8 mg/dL — ABNORMAL HIGH (ref 0.3–1.2)
Total Protein: 6.5 g/dL (ref 6.5–8.1)

## 2023-04-02 LAB — TSH: TSH: 0.441 u[IU]/mL (ref 0.350–4.500)

## 2023-04-02 LAB — CREATININE, SERUM
Creatinine, Ser: 2.25 mg/dL — ABNORMAL HIGH (ref 0.61–1.24)
GFR, Estimated: 34 mL/min — ABNORMAL LOW (ref >60)

## 2023-04-02 LAB — ETHANOL: Alcohol, Ethyl (B): 13 mg/dL — ABNORMAL HIGH (ref <10)

## 2023-04-02 LAB — HIV ANTIBODY (ROUTINE TESTING W REFLEX): HIV Screen 4th Generation wRfx: NONREACTIVE

## 2023-04-02 MED ORDER — BUSPIRONE HCL 10 MG PO TABS
10.0000 mg | ORAL_TABLET | Freq: Two times a day (BID) | ORAL | Status: DC
  Administered 2023-04-02 – 2023-04-04 (×4): 10 mg via ORAL
  Filled 2023-04-02 (×4): qty 1

## 2023-04-02 MED ORDER — PNEUMOCOCCAL 20-VAL CONJ VACC 0.5 ML IM SUSY
0.5000 mL | PREFILLED_SYRINGE | INTRAMUSCULAR | Status: AC
  Administered 2023-04-03: 0.5 mL via INTRAMUSCULAR
  Filled 2023-04-02: qty 0.5

## 2023-04-02 MED ORDER — LACTATED RINGERS IV BOLUS
1000.0000 mL | Freq: Once | INTRAVENOUS | Status: AC
  Administered 2023-04-02: 1000 mL via INTRAVENOUS

## 2023-04-02 MED ORDER — ACETAMINOPHEN 650 MG RE SUPP: 650.0000 mg | Freq: Four times a day (QID) | RECTAL | Status: DC | PRN

## 2023-04-02 MED ORDER — ACETAMINOPHEN 325 MG PO TABS: 650.0000 mg | ORAL_TABLET | Freq: Four times a day (QID) | ORAL | Status: DC | PRN

## 2023-04-02 MED ORDER — FLUOXETINE HCL 20 MG PO CAPS
40.0000 mg | ORAL_CAPSULE | Freq: Every day | ORAL | Status: DC
  Administered 2023-04-03 – 2023-04-04 (×2): 40 mg via ORAL
  Filled 2023-04-02 (×3): qty 2

## 2023-04-02 MED ORDER — ONDANSETRON HCL 4 MG PO TABS: 4.0000 mg | ORAL_TABLET | Freq: Four times a day (QID) | ORAL | Status: DC | PRN

## 2023-04-02 MED ORDER — ONDANSETRON HCL 4 MG/2ML IJ SOLN
4.0000 mg | Freq: Four times a day (QID) | INTRAMUSCULAR | Status: DC | PRN
  Administered 2023-04-02: 4 mg via INTRAVENOUS
  Filled 2023-04-02: qty 2

## 2023-04-02 MED ORDER — LACTATED RINGERS IV SOLN: INTRAVENOUS | Status: AC

## 2023-04-02 MED ORDER — HEPARIN SODIUM (PORCINE) 5000 UNIT/ML IJ SOLN
5000.0000 [IU] | Freq: Three times a day (TID) | INTRAMUSCULAR | Status: DC
  Administered 2023-04-02 – 2023-04-04 (×5): 5000 [IU] via SUBCUTANEOUS
  Filled 2023-04-02 (×6): qty 1

## 2023-04-02 NOTE — ED Notes (Signed)
Patient transported to CT 

## 2023-04-02 NOTE — H&P (Signed)
History and Physical    Patient: Jeffrey Hodges EXB:[id number] DOB: 1966-03-06 DOA: 04/02/2023 DOS: the patient was seen and examined on 04/02/2023 PCP: Eden Emms, NP  Patient coming from: Home.  Lives with wife.  Independently ambulates at baseline.  Chief Complaint:  Chief Complaint  Patient presents with   Dizziness   Weakness   HPI: Jeffrey Hodges is a 57 y.o. male with PMH of CAD s/p PCI and stent in 08/2022, HTN, anxiety and depression presenting with dizziness, shortness of breath and frequent for for about 5 to 6 months.  Patient reports dizziness, dyspnea with exertion, frequent falls and syncope over the last 5 to 6 months.  No acute change.  He describes the dizziness as lightheadedness.  Reports running to walls, poles and passing out.  Happens almost every week.  Last fall yesterday about 12 PM.  He denies hitting his head but woke up on the ground.  Denies prodrome such as palpitation.  Denies chest pain.  Denies orthopnea, PND or leg edema.  Denies GI, UTI or focal neurosymptoms.  Denies acute vision change.  He called PCP office today and he was advised to come to emergency department.  He denies drinking alcohol.  He denies smoking cigarette but admits to vaping.  He denies recreational drug use.  Likes to have cardiopulmonary resuscitation in an event of sudden cardiopulmonary arrest.  In ED, stable vitals although soft blood pressures. Na 132. Cr 2.5 (0.95 in 08/2022).  BUN 13.  CO2 18.  Hgb 12.3.  Platelets 117.  Hemoccult negative.  EKG sinus tachycardia to 107 with nonspecific ST and T wave changes.  CT head without acute finding but question about dislocated left lens.  Received IV fluid bolus.  Renal US, LFT and troponin ordered.  Hospital service called for admission for recurrent syncope and possible AKI   Review of Systems: As mentioned in the history of present illness. All other systems reviewed and are negative. 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 He has never been exposed to tobacco smoke. He has never used smokeless tobacco. He reports that he does not currently use alcohol. He reports current drug use. Drug: Marijuana.  No Known Allergies  Family History  Problem Relation Age of Onset   CAD Neg Hx    CVA Neg Hx    Diabetes Neg Hx     Prior to Admission medications   Medication Sig Start Date End Date Taking? Authorizing Provider  clopidogrel (PLAVIX) 75 MG tablet Take 1 tablet (75 mg total) by mouth daily. 10/04/22   Joylene Grapes, NP  clopidogrel (PLAVIX) 75 MG tablet Take 4 tablets once as a starting dose of Plavix. 10/04/22   Joylene Grapes, NP  amLODipine (NORVASC) 2.5 MG  tablet Take 1 tablet (2.5 mg total) by mouth daily. 09/20/22   Joylene Grapes, NP  aspirin EC 81 MG tablet Take 1 tablet (81 mg total) by mouth daily. Swallow whole. 09/20/22   Joylene Grapes, NP  atorvastatin (LIPITOR) 80 MG tablet Take 1 tablet (80 mg total) by mouth daily. 09/20/22   Joylene Grapes, NP  busPIRone (BUSPAR) 10 MG tablet TAKE 1 TABLET BY MOUTH TWICE A DAY 01/08/23   Eden Emms, NP  FLUoxetine (PROZAC) 40 MG capsule TAKE 1 CAPSULE (40 MG TOTAL) BY MOUTH DAILY. 12/17/22   Eden Emms, NP  LORazepam (ATIVAN) 0.5 MG tablet TAKE 1/2 OR 1 TABLET BY MOUTH TWICE A DAY AS  NEEDED FOR ANXIETY 02/06/23   Eden Emms, NP  losartan (COZAAR) 25 MG tablet Take 1 tablet (25 mg total) by mouth daily. 09/20/22   Joylene Grapes, NP  metoprolol tartrate (LOPRESSOR) 25 MG tablet Take 0.5 tablets (12.5 mg total) by mouth 2 (two) times daily. 09/20/22   Joylene Grapes, NP  nitroGLYCERIN (NITROSTAT) 0.4 MG SL tablet Place 1 tablet (0.4 mg total) under the tongue every 5 (five) minutes x 3 doses as needed for chest pain. 09/20/22   Joylene Grapes, NP  ondansetron (ZOFRAN-ODT) 4 MG disintegrating tablet Take 1 tablet (4 mg total) by mouth every 8 (eight) hours as needed for nausea or vomiting. 10/01/22   Eden Emms, NP    Physical Exam: Vitals:   04/02/23 1300 04/02/23 1315 04/02/23 1330 04/02/23 1415  BP: 99/62 107/66 109/64 115/61  Pulse: 81 81 81 81  Resp: 17 17 15 16   Temp:      TempSrc:      SpO2: 100% 100% 100% 100%  Weight:      Height:       GENERAL: No apparent distress.  Nontoxic. HEENT: MMM.  Vision and hearing grossly intact.  NECK: Supple.  No apparent JVD.  RESP:  No IWOB.  Fair aeration bilaterally. CVS:  RRR. Heart sounds normal.  ABD/GI/GU: BS+. Abd soft, NTND.  MSK/EXT:   No apparent deformity. Moves extremities. No edema.  SKIN: no apparent skin lesion or wound NEURO: Awake and alert. Oriented appropriately.  No apparent focal neuro deficit. PSYCH: Calm. Normal affect.   Data Reviewed: See HPI  Assessment and Plan: Principal Problem:   Syncope and collapse Active Problems:   Primary hypertension   Anxiety and depression   History of CAD (coronary artery disease)   Syncope and collapse: Nonacute.  Recurrent episodes since he had non-STEMI.  Looks like orthostatic based on patient's description.  He is normotensive but soft blood pressures.  No focal neurodeficit.  EKG sinus tachycardia at 107 with nonspecific ST and T wave changes.  He has elevated LFT with pattern consistent with alcohol rhabdo but he denies drinking alcohol. -Check  echocardiogram, orthostatic vitals, TSH, UDS -Hold home antihypertensive meds. -Fall precaution -PT/OT eval   Elevated liver enzymes: No GI symptoms.  No RUQ tenderness.  Pattern consistent with rhabdo alcohol but patient denies drinking alcohol.  -Hold statin -RUQ [place] -Acute hepatitis -Check EtOH level and CK.  History of CAD s/p stent in 08/2022: Patient denies chest pain but dyspnea with exertion.  Slightly elevated troponin to 61.  Reports good compliance with his medications including aspirin and Brilinta. -Check echocardiogram -Continue cycling troponin -Resume home Brilinta and aspirin. -Hold statin and antihypertensive meds  AKI? Cr 12.5 (from 0.95 in 08/2022).  Unclear if this  is acute or chronic.  Denies NSAID use.  Renal ultrasound did not show hydronephrosis.  Received 2 L LR boluses in ED -Continue IV fluid -Strict intake and output  Anxiety and depression: He states that he was recently started on Valium that he has not been taking. -Continue home meds after med rec  Thrombocytopenia: Platelet 117.  Was 207 in 08/2022. -Continue monitoring   Advance Care Planning:   Code Status: Full Code.  Discussed with patient.  Consults: None  Family Communication: None at bedside  Severity of Illness: The appropriate patient status for this patient is OBSERVATION. Observation status is judged to be reasonable and necessary in order to provide the required intensity of service to ensure the patient's safety. The patient's presenting symptoms, physical exam findings, and initial radiographic and laboratory data in the context of their medical condition is felt to place them at decreased risk for further clinical deterioration. Furthermore, it is anticipated that the patient will be medically stable for discharge from the hospital within 2 midnights of admission.   Author: Almon Hercules, MD 04/02/2023 4:21 PM  For on call review [email or web address].

## 2023-04-02 NOTE — Telephone Encounter (Signed)
Per chart review pt is presently at [facility] ED. Sending note to Audria Nine NP.

## 2023-04-02 NOTE — ED Notes (Signed)
Pt reports that he thinks he had a "heart attack six months ago and got stent put in and hasn't felt right since and has been having balance issues."

## 2023-04-02 NOTE — Telephone Encounter (Signed)
FYI: This call has been transferred to Access Nurse. Once the result note has been entered staff can address the message at that time.  Patient called in with the following symptoms:  Red Word: can't catch breath Patient wife called in and stated that patient is having a hard time catching his breath. She stated that he was at work. Spoke with patient and he stated that he just started a new job today. Scheduled to see Mayra Reel today at 10:40.    Please advise at Mobile [phone] (mobile)  Message is routed to Provider Pool and [facility] Triage

## 2023-04-02 NOTE — ED Triage Notes (Addendum)
Pt. Stated, I've had SOB and dizziness and Im unable to walk without losing my balance. This happened after my stent about 5-6 months ago

## 2023-04-02 NOTE — Progress Notes (Signed)
Attempted Echocardiogram, Nurse asked to hold off until after the patient is transferred to another room.

## 2023-04-02 NOTE — ED Notes (Signed)
ED TO INPATIENT HANDOFF REPORT  ED Nurse Name and Phone #: Sevyn Paredez [phone]  S Name/Age/Gender Jeffrey Hodges 57 y.o. male Room/Bed: 016C/016C  Code Status   Code Status: Prior  Home/SNF/Other Home Patient oriented to: self, place, time, and situation Is this baseline? Yes   Triage Complete: Triage complete  Chief Complaint Near syncope [R55]  Triage Note Pt. Stated, I've had SOB and dizziness and Im unable to walk without losing my balance. This happened after my stent about 5-6 months ago   Allergies No Known Allergies  Level of Care/Admitting Diagnosis ED Disposition     ED Disposition  Admit   Condition  --   Comment  Hospital Area: [facility] [[id number]]  Level of Care: Telemetry Medical [104]  May place patient in observation at [facility] or [facility] if equivalent level of care is available:: No  Covid Evaluation: Asymptomatic - no recent exposure (last 10 days) testing not required  Diagnosis: Near syncope [phone]  Admitting Physician: Almon Hercules [[id number]]  Attending Physician: Almon Hercules [[id number]]          B Medical/Surgery History Past Medical History:  Diagnosis Date   Anxiety    Coronary artery disease    Depression    Hyperlipidemia 09/09/2022   Hypertension    Past Surgical History:  Procedure Laterality Date   CORONARY STENT INTERVENTION N/A 09/11/2022   Procedure: CORONARY STENT INTERVENTION;  Surgeon: Orbie Pyo, MD;  Location: [facility];  Service: Cardiovascular;  Laterality: N/A;   EYE SURGERY Left 1989   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   LEFT HEART CATH AND CORONARY ANGIOGRAPHY N/A 09/11/2022   Procedure: LEFT HEART CATH AND CORONARY ANGIOGRAPHY;  Surgeon: Orbie Pyo, MD;  Location: [facility];  Service: Cardiovascular;  Laterality: N/A;     A IV Location/Drains/Wounds Patient Lines/Drains/Airways Status     Active Line/Drains/Airways     Name Placement date  Placement time Site Days   Peripheral IV 04/02/23 20 G Right Antecubital 04/02/23  1320  Antecubital  less than 1            Intake/Output Last 24 hours No intake or output data in the 24 hours ending 04/02/23 1540  Labs/Imaging Results for orders placed or performed during the hospital encounter of 04/02/23 (from the past 48 hour(s))  Basic metabolic panel     Status: Abnormal   Collection Time: 04/02/23 10:57 AM  Result Value Ref Range   Sodium 132 (L) 135 - 145 mmol/L   Potassium 3.9 3.5 - 5.1 mmol/L   Chloride 97 (L) 98 - 111 mmol/L   CO2 18 (L) 22 - 32 mmol/L   Glucose, Bld 120 (H) 70 - 99 mg/dL    Comment: Glucose reference range applies only to samples taken after fasting for at least 8 hours.   BUN 13 6 - 20 mg/dL   Creatinine, Ser 8.41 (H) 0.61 - 1.24 mg/dL   Calcium 8.3 (L) 8.9 - 10.3 mg/dL   GFR, Estimated 30 (L) >60 mL/min    Comment: (NOTE) Calculated using the CKD-EPI Creatinine Equation (2021)    Anion gap 17 (H) 5 - 15    Comment: Performed at [facility], [address] [place], [place] [address]  CBC     Status: Abnormal   Collection Time: 04/02/23 10:57 AM  Result Value Ref Range   WBC 5.2 4.0 - 10.5 K/uL   RBC  3.53 (L) 4.22 - 5.81 MIL/uL   Hemoglobin 12.3 (L) 13.0 - 17.0 g/dL   HCT 29.5 (L) 62.1 - 30.8 %   MCV 98.9 80.0 - 100.0 fL   MCH 34.8 (H) 26.0 - 34.0 pg   MCHC 35.2 30.0 - 36.0 g/dL   RDW 65.7 (H) 84.6 - 96.2 %   Platelets 117 (L) 150 - 400 K/uL   nRBC 0.0 0.0 - 0.2 %    Comment: Performed at [facility], [address], [place], [place] [address]  Urinalysis, Routine w reflex microscopic -Urine, Clean Catch     Status: Abnormal   Collection Time: 04/02/23 12:57 PM  Result Value Ref Range   Color, Urine AMBER (A) YELLOW    Comment: BIOCHEMICALS MAY BE AFFECTED BY COLOR   APPearance CLOUDY (A) CLEAR   Specific Gravity, Urine 1.015 1.005 - 1.030   pH 5.0 5.0 - 8.0   Glucose, UA NEGATIVE NEGATIVE mg/dL   Hgb urine dipstick  LARGE (A) NEGATIVE   Bilirubin Urine NEGATIVE NEGATIVE   Ketones, ur NEGATIVE NEGATIVE mg/dL   Protein, ur 132 (A) NEGATIVE mg/dL   Nitrite NEGATIVE NEGATIVE   Leukocytes,Ua NEGATIVE NEGATIVE   RBC / HPF 0-5 0 - 5 RBC/hpf   WBC, UA 6-10 0 - 5 WBC/hpf   Bacteria, UA RARE (A) NONE SEEN   Squamous Epithelial / HPF 0-5 0 - 5 /HPF   Mucus PRESENT    Hyaline Casts, UA PRESENT    Granular Casts, UA PRESENT     Comment: Performed at [facility], [address] [place], [place] [address]  POC occult blood, ED Provider will collect     Status: None   Collection Time: 04/02/23  1:20 PM  Result Value Ref Range   Fecal Occult Bld NEGATIVE NEGATIVE  Troponin I (High Sensitivity)     Status: Abnormal   Collection Time: 04/02/23  1:21 PM  Result Value Ref Range   Troponin I (High Sensitivity) 61 (H) <18 ng/L    Comment: (NOTE) Elevated high sensitivity troponin I (hsTnI) values and significant  changes across serial measurements may suggest ACS but many other  chronic and acute conditions are known to elevate hsTnI results.  Refer to the "Links" section for chest pain algorithms and additional  guidance. Performed at [facility], [address] [place], [place] [address]   Hepatic function panel     Status: Abnormal   Collection Time: 04/02/23  1:21 PM  Result Value Ref Range   Total Protein 6.5 6.5 - 8.1 g/dL   Albumin 2.3 (L) 3.5 - 5.0 g/dL   AST 664 (H) 15 - 41 U/L   ALT 202 (H) 0 - 44 U/L   Alkaline Phosphatase 577 (H) 38 - 126 U/L   Total Bilirubin 3.8 (H) 0.3 - 1.2 mg/dL   Bilirubin, Direct 1.8 (H) 0.0 - 0.2 mg/dL   Indirect Bilirubin 2.0 (H) 0.3 - 0.9 mg/dL    Comment: Performed at [facility], [address] [place], [place] [address]   CT Head Wo Contrast  Result Date: 04/02/2023 CLINICAL DATA:  Mental status change, unknown cause; Neck trauma, midline tenderness (Age 33-64y) EXAM: CT HEAD WITHOUT CONTRAST CT CERVICAL SPINE WITHOUT CONTRAST TECHNIQUE:  Multidetector CT imaging of the head and cervical spine was performed following the standard protocol without intravenous contrast. Multiplanar CT image reconstructions of the cervical spine were also generated. RADIATION DOSE REDUCTION: This exam was performed according  to the departmental dose-optimization program which includes automated exposure control, adjustment of the mA and/or kV according to patient size and/or use of iterative reconstruction technique. COMPARISON:  CT Head 09/14/06 FINDINGS: CT HEAD FINDINGS Brain: No hemorrhage. No hydrocephalus. No extra-axial fluid collection. No CT evidence of an acute cortical infarct. No mass effect. No mass lesion. Vascular: No hyperdense vessel or unexpected calcification. Skull: Normal. Negative for fracture or focal lesion. Sinuses/Orbits: No middle ear or mastoid effusion. Paranasal sinuses are clear. The left lens is posteriorly dislocated. Left-sided scleral band in place. Other: None. CT CERVICAL SPINE FINDINGS Alignment: Grade 1 anterolisthesis of C4 on C5. Trace retrolisthesis of C5 on C6. Skull base and vertebrae: No acute fracture. No primary bone lesion or focal pathologic process. Soft tissues and spinal canal: No prevertebral fluid or swelling. No visible canal hematoma. Disc levels:  No evidence of high-grade spinal canal stenosis. Upper chest: Negative. Other: None IMPRESSION: 1. No acute intracranial abnormality. 2. No acute fracture or traumatic malalignment of the cervical spine. 3. 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 Correlate with visual exam and consider opthalmologic consultation. Electronically Signed   By: Lorenza Cambridge M.D.   On: 04/02/2023 14:50   CT Cervical Spine Wo Contrast  Result Date: 04/02/2023 CLINICAL DATA:  Mental status change, unknown cause; Neck trauma, midline tenderness (Age 42-64y) EXAM: CT HEAD WITHOUT CONTRAST CT CERVICAL SPINE WITHOUT CONTRAST TECHNIQUE: Multidetector CT imaging of the head and  cervical spine was performed following the standard protocol without intravenous contrast. Multiplanar CT image reconstructions of the cervical spine were also generated. RADIATION DOSE REDUCTION: This exam was performed according to the departmental dose-optimization program which includes automated exposure control, adjustment of the mA and/or kV according to patient size and/or use of iterative reconstruction technique. COMPARISON:  CT Head 09/14/06 FINDINGS: CT HEAD FINDINGS Brain: No hemorrhage. No hydrocephalus. No extra-axial fluid collection. No CT evidence of an acute cortical infarct. No mass effect. No mass lesion. Vascular: No hyperdense vessel or unexpected calcification. Skull: Normal. Negative for fracture or focal lesion. Sinuses/Orbits: No middle ear or mastoid effusion. Paranasal sinuses are clear. The left lens is posteriorly dislocated. Left-sided scleral band in place. Other: None. CT CERVICAL SPINE FINDINGS Alignment: Grade 1 anterolisthesis of C4 on C5. Trace retrolisthesis of C5 on C6. Skull base and vertebrae: No acute fracture. No primary bone lesion or focal pathologic process. Soft tissues and spinal canal: No prevertebral fluid or swelling. No visible canal hematoma. Disc levels:  No evidence of high-grade spinal canal stenosis. Upper chest: Negative. Other: None IMPRESSION: 1. No acute intracranial abnormality. 2. No acute fracture or traumatic malalignment of the cervical spine. 3. 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 Correlate with visual exam and consider opthalmologic consultation. Electronically Signed   By: Lorenza Cambridge M.D.   On: 04/02/2023 14:50   DG Chest Port 1 View  Result Date: 04/02/2023 CLINICAL DATA:  weakness EXAM: PORTABLE CHEST 1 VIEW COMPARISON:  Same day chest radiograph, September 07, 2022. FINDINGS: The cardiomediastinal silhouette is unchanged in contour. No pleural effusion. No pneumothorax. No acute pleuroparenchymal abnormality. IMPRESSION: No  acute cardiopulmonary abnormality. Electronically Signed   By: Meda Klinefelter M.D.   On: 04/02/2023 13:56   DG Chest 2 View  Result Date: 04/02/2023 CLINICAL DATA:  Shortness of breath.  Dizziness. EXAM: CHEST - 2 VIEW COMPARISON:  09/10/2022. FINDINGS: Bilateral lung fields are clear. Bilateral costophrenic angles are clear. Normal cardio-mediastinal silhouette. No acute osseous abnormalities. The soft  tissues are within normal limits. IMPRESSION: No active cardiopulmonary disease. Electronically Signed   By: Jules Schick M.D.   On: 04/02/2023 12:09    Pending Labs Unresulted Labs (From admission, onward)     Start     Ordered   04/02/23 1516  Creatinine, urine, random  Once,   R        04/02/23 1516   04/02/23 1516  Sodium, urine, random  Once,   R        04/02/23 1516   04/02/23 1515  TSH  Once,   R        04/02/23 1516   04/02/23 1515  Rapid urine drug screen (hospital performed)  ONCE - STAT,   STAT        04/02/23 1516            Vitals/Pain Today's Vitals   04/02/23 1300 04/02/23 1315 04/02/23 1330 04/02/23 1415  BP: 99/62 107/66 109/64 115/61  Pulse: 81 81 81 81  Resp: 17 17 15 16   Temp:      TempSrc:      SpO2: 100% 100% 100% 100%  Weight:      Height:      PainSc:        Isolation Precautions No active isolations  Medications Medications  lactated ringers bolus 1,000 mL (0 mLs Intravenous Stopped 04/02/23 1457)  lactated ringers bolus 1,000 mL (1,000 mLs Intravenous New Bag/Given 04/02/23 1512)    Mobility walks with person assist     Focused Assessments Neuro Assessment Handoff:           Neuro Assessment: Within Defined Limits Neuro Checks:      If patient is a Neuro Trauma and patient is going to OR before floor call report to 4N Charge nurse: [phone] or [phone]   R Recommendations: See Admitting Provider Note  Report given to:   Additional Notes:

## 2023-04-02 NOTE — Telephone Encounter (Signed)
Noted. See that patient is being evaluated in the ED currently

## 2023-04-02 NOTE — ED Provider Notes (Signed)
[place] EMERGENCY DEPARTMENT AT [facility] Provider Note   CSN: [id number] Arrival date & time: 04/02/23  1020     History  Chief Complaint  Patient presents with   Dizziness   Weakness    Jeffrey Hodges is a 57 y.o. male.  Pt c/o general weakness in past 6 months - states had cardiac stent then. Indicates since that time generally weak, lightheaded when stands, feels faint and that has led to periodic falls, feels unsteady when walking, holds onto walls. Notes syncope/fall in past day. Denies abrupt change in symptoms today or this week. Denies headache. No change in speech or vision. No new numbness or any focal or unilateral weakness. Denies change in meds, other than being told to hold a bp med as his bp had been trending low. Denies significant wt loss other than periods a couple lbs. Normal appetite. No nvd. No abd pain. No chest pain or discomfort. No sob. No cough or fever. Recently had covid imm. No extremity pain, injury or swelling. No melena, rectal bleeding or other blood loss.   The history is provided by the patient and medical records.  Dizziness Associated symptoms: shortness of breath   Associated symptoms: no blood in stool, no chest pain, no diarrhea, no headaches and no vomiting   Shortness of Breath Associated symptoms: no abdominal pain, no chest pain, no cough, no diaphoresis, no fever, no headaches, no neck pain, no rash, no sore throat and no vomiting        Home Medications Prior to Admission medications   Medication Sig Start Date End Date Taking? Authorizing Provider  clopidogrel (PLAVIX) 75 MG tablet Take 1 tablet (75 mg total) by mouth daily. 10/04/22   Joylene Grapes, NP  clopidogrel (PLAVIX) 75 MG tablet Take 4 tablets once as a starting dose of Plavix. 10/04/22   Joylene Grapes, NP  amLODipine (NORVASC) 2.5 MG tablet Take 1 tablet (2.5 mg total) by mouth daily. 09/20/22   Joylene Grapes, NP  aspirin EC 81 MG tablet Take 1 tablet (81 mg  total) by mouth daily. Swallow whole. 09/20/22   Joylene Grapes, NP  atorvastatin (LIPITOR) 80 MG tablet Take 1 tablet (80 mg total) by mouth daily. 09/20/22   Joylene Grapes, NP  busPIRone (BUSPAR) 10 MG tablet TAKE 1 TABLET BY MOUTH TWICE A DAY 01/08/23   Eden Emms, NP  FLUoxetine (PROZAC) 40 MG capsule TAKE 1 CAPSULE (40 MG TOTAL) BY MOUTH DAILY. 12/17/22   Eden Emms, NP  LORazepam (ATIVAN) 0.5 MG tablet TAKE 1/2 OR 1 TABLET BY MOUTH TWICE A DAY AS NEEDED FOR ANXIETY 02/06/23   Eden Emms, NP  losartan (COZAAR) 25 MG tablet Take 1 tablet (25 mg total) by mouth daily. 09/20/22   Joylene Grapes, NP  metoprolol tartrate (LOPRESSOR) 25 MG tablet Take 0.5 tablets (12.5 mg total) by mouth 2 (two) times daily. 09/20/22   Joylene Grapes, NP  nitroGLYCERIN (NITROSTAT) 0.4 MG SL tablet Place 1 tablet (0.4 mg total) under the tongue every 5 (five) minutes x 3 doses as needed for chest pain. 09/20/22   Joylene Grapes, NP  ondansetron (ZOFRAN-ODT) 4 MG disintegrating tablet Take 1 tablet (4 mg total) by mouth every 8 (eight) hours as needed for nausea or vomiting. 10/01/22   Eden Emms, NP      Allergies    Patient has no known allergies.    Review of Systems  Review of Systems  Constitutional:  Negative for chills, diaphoresis and fever.  HENT:  Negative for sore throat.   Eyes:  Negative for redness and visual disturbance.  Respiratory:  Positive for shortness of breath. Negative for cough.   Cardiovascular:  Negative for chest pain.  Gastrointestinal:  Negative for abdominal pain, blood in stool, diarrhea and vomiting.  Genitourinary:  Negative for dysuria and flank pain.  Musculoskeletal:  Negative for back pain and neck pain.  Skin:  Negative for rash.  Neurological:  Positive for dizziness and syncope. Negative for speech difficulty, numbness and headaches.  Hematological:  Does not bruise/bleed easily.  Psychiatric/Behavioral:  Negative for confusion.     Physical Exam Updated Vital  Signs BP 115/61   Pulse 81   Temp (!) 97.3 F (36.3 C) (Oral)   Resp 16   Ht 1.854 m (6\' 1" )   Wt 95.3 kg   SpO2 100%   BMI 27.71 kg/m  Physical Exam Vitals and nursing note reviewed.  Constitutional:      Appearance: Normal appearance. He is well-developed.  HENT:     Head: Atraumatic.     Nose: Nose normal.     Mouth/Throat:     Mouth: Mucous membranes are moist.     Pharynx: Oropharynx is clear.  Eyes:     General: No scleral icterus.    Conjunctiva/sclera: Conjunctivae normal.     Pupils: Pupils are equal, round, and reactive to light.  Neck:     Vascular: No carotid bruit.     Trachea: No tracheal deviation.  Cardiovascular:     Rate and Rhythm: Normal rate and regular rhythm.     Pulses: Normal pulses.     Heart sounds: Normal heart sounds. No murmur heard.    No friction rub. No gallop.  Pulmonary:     Effort: Pulmonary effort is normal. No accessory muscle usage or respiratory distress.     Breath sounds: Normal breath sounds.  Abdominal:     General: Bowel sounds are normal. There is no distension.     Palpations: Abdomen is soft. There is no mass.     Tenderness: There is no abdominal tenderness. There is no guarding.  Genitourinary:    Comments: No cva tenderness. Light brown stool, sent for hemoccult. Musculoskeletal:        General: No swelling.     Cervical back: Normal range of motion and neck supple. No rigidity or tenderness.  Skin:    General: Skin is warm and dry.     Findings: No rash.  Neurological:     General: No focal deficit present.     Mental Status: He is alert and oriented to person, place, and time.     Comments: Alert, speech clear. Motor/sens grossly intact bil.   Psychiatric:        Mood and Affect: Mood normal.     ED Results / Procedures / Treatments   Labs (all labs ordered are listed, but only abnormal results are displayed) Results for orders placed or performed during the hospital encounter of 04/02/23  Basic  metabolic panel  Result Value Ref Range   Sodium 132 (L) 135 - 145 mmol/L   Potassium 3.9 3.5 - 5.1 mmol/L   Chloride 97 (L) 98 - 111 mmol/L   CO2 18 (L) 22 - 32 mmol/L   Glucose, Bld 120 (H) 70 - 99 mg/dL   BUN 13 6 - 20 mg/dL   Creatinine, Ser 3.47 (H) 0.61 -  1.24 mg/dL   Calcium 8.3 (L) 8.9 - 10.3 mg/dL   GFR, Estimated 30 (L) >60 mL/min   Anion gap 17 (H) 5 - 15  CBC  Result Value Ref Range   WBC 5.2 4.0 - 10.5 K/uL   RBC 3.53 (L) 4.22 - 5.81 MIL/uL   Hemoglobin 12.3 (L) 13.0 - 17.0 g/dL   HCT 08.6 (L) 57.8 - 46.9 %   MCV 98.9 80.0 - 100.0 fL   MCH 34.8 (H) 26.0 - 34.0 pg   MCHC 35.2 30.0 - 36.0 g/dL   RDW 62.9 (H) 52.8 - 41.3 %   Platelets 117 (L) 150 - 400 K/uL   nRBC 0.0 0.0 - 0.2 %  POC occult blood, ED Provider will collect  Result Value Ref Range   Fecal Occult Bld NEGATIVE NEGATIVE   CT Head Wo Contrast  Result Date: 04/02/2023 CLINICAL DATA:  Mental status change, unknown cause; Neck trauma, midline tenderness (Age 47-64y) EXAM: CT HEAD WITHOUT CONTRAST CT CERVICAL SPINE WITHOUT CONTRAST TECHNIQUE: Multidetector CT imaging of the head and cervical spine was performed following the standard protocol without intravenous contrast. Multiplanar CT image reconstructions of the cervical spine were also generated. RADIATION DOSE REDUCTION: This exam was performed according to the departmental dose-optimization program which includes automated exposure control, adjustment of the mA and/or kV according to patient size and/or use of iterative reconstruction technique. COMPARISON:  CT Head 09/14/06 FINDINGS: CT HEAD FINDINGS Brain: No hemorrhage. No hydrocephalus. No extra-axial fluid collection. No CT evidence of an acute cortical infarct. No mass effect. No mass lesion. Vascular: No hyperdense vessel or unexpected calcification. Skull: Normal. Negative for fracture or focal lesion. Sinuses/Orbits: No middle ear or mastoid effusion. Paranasal sinuses are clear. The left lens is  posteriorly dislocated. Left-sided scleral band in place. Other: None. CT CERVICAL SPINE FINDINGS Alignment: Grade 1 anterolisthesis of C4 on C5. Trace retrolisthesis of C5 on C6. Skull base and vertebrae: No acute fracture. No primary bone lesion or focal pathologic process. Soft tissues and spinal canal: No prevertebral fluid or swelling. No visible canal hematoma. Disc levels:  No evidence of high-grade spinal canal stenosis. Upper chest: Negative. Other: None IMPRESSION: 1. No acute intracranial abnormality. 2. No acute fracture or traumatic malalignment of the cervical spine. 3. 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 Correlate with visual exam and consider opthalmologic consultation. Electronically Signed   By: Lorenza Cambridge M.D.   On: 04/02/2023 14:50   CT Cervical Spine Wo Contrast  Result Date: 04/02/2023 CLINICAL DATA:  Mental status change, unknown cause; Neck trauma, midline tenderness (Age 36-64y) EXAM: CT HEAD WITHOUT CONTRAST CT CERVICAL SPINE WITHOUT CONTRAST TECHNIQUE: Multidetector CT imaging of the head and cervical spine was performed following the standard protocol without intravenous contrast. Multiplanar CT image reconstructions of the cervical spine were also generated. RADIATION DOSE REDUCTION: This exam was performed according to the departmental dose-optimization program which includes automated exposure control, adjustment of the mA and/or kV according to patient size and/or use of iterative reconstruction technique. COMPARISON:  CT Head 09/14/06 FINDINGS: CT HEAD FINDINGS Brain: No hemorrhage. No hydrocephalus. No extra-axial fluid collection. No CT evidence of an acute cortical infarct. No mass effect. No mass lesion. Vascular: No hyperdense vessel or unexpected calcification. Skull: Normal. Negative for fracture or focal lesion. Sinuses/Orbits: No middle ear or mastoid effusion. Paranasal sinuses are clear. The left lens is posteriorly dislocated. Left-sided scleral band  in place. Other: None. CT CERVICAL SPINE FINDINGS Alignment: Grade 1 anterolisthesis  of C4 on C5. Trace retrolisthesis of C5 on C6. Skull base and vertebrae: No acute fracture. No primary bone lesion or focal pathologic process. Soft tissues and spinal canal: No prevertebral fluid or swelling. No visible canal hematoma. Disc levels:  No evidence of high-grade spinal canal stenosis. Upper chest: Negative. Other: None IMPRESSION: 1. No acute intracranial abnormality. 2. No acute fracture or traumatic malalignment of the cervical spine. 3. 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 Correlate with visual exam and consider opthalmologic consultation. Electronically Signed   By: Lorenza Cambridge M.D.   On: 04/02/2023 14:50   DG Chest Port 1 View  Result Date: 04/02/2023 CLINICAL DATA:  weakness EXAM: PORTABLE CHEST 1 VIEW COMPARISON:  Same day chest radiograph, September 07, 2022. FINDINGS: The cardiomediastinal silhouette is unchanged in contour. No pleural effusion. No pneumothorax. No acute pleuroparenchymal abnormality. IMPRESSION: No acute cardiopulmonary abnormality. Electronically Signed   By: Meda Klinefelter M.D.   On: 04/02/2023 13:56   DG Chest 2 View  Result Date: 04/02/2023 CLINICAL DATA:  Shortness of breath.  Dizziness. EXAM: CHEST - 2 VIEW COMPARISON:  09/10/2022. FINDINGS: Bilateral lung fields are clear. Bilateral costophrenic angles are clear. Normal cardio-mediastinal silhouette. No acute osseous abnormalities. The soft tissues are within normal limits. IMPRESSION: No active cardiopulmonary disease. Electronically Signed   By: Jules Schick M.D.   On: 04/02/2023 12:09     EKG EKG Interpretation Date/Time:  Wednesday April 02 2023 10:39:46 EDT Ventricular Rate:  107 PR Interval:  132 QRS Duration:  82 QT Interval:  332 QTC Calculation: 443 R Axis:   82  Text Interpretation: Sinus tachycardia Non-specific ST-t changes Confirmed by Cathren Laine ([id number]) on 04/02/2023  12:39:37 PM  Radiology CT Head Wo Contrast  Result Date: 04/02/2023 CLINICAL DATA:  Mental status change, unknown cause; Neck trauma, midline tenderness (Age 39-64y) EXAM: CT HEAD WITHOUT CONTRAST CT CERVICAL SPINE WITHOUT CONTRAST TECHNIQUE: Multidetector CT imaging of the head and cervical spine was performed following the standard protocol without intravenous contrast. Multiplanar CT image reconstructions of the cervical spine were also generated. RADIATION DOSE REDUCTION: This exam was performed according to the departmental dose-optimization program which includes automated exposure control, adjustment of the mA and/or kV according to patient size and/or use of iterative reconstruction technique. COMPARISON:  CT Head 09/14/06 FINDINGS: CT HEAD FINDINGS Brain: No hemorrhage. No hydrocephalus. No extra-axial fluid collection. No CT evidence of an acute cortical infarct. No mass effect. No mass lesion. Vascular: No hyperdense vessel or unexpected calcification. Skull: Normal. Negative for fracture or focal lesion. Sinuses/Orbits: No middle ear or mastoid effusion. Paranasal sinuses are clear. The left lens is posteriorly dislocated. Left-sided scleral band in place. Other: None. CT CERVICAL SPINE FINDINGS Alignment: Grade 1 anterolisthesis of C4 on C5. Trace retrolisthesis of C5 on C6. Skull base and vertebrae: No acute fracture. No primary bone lesion or focal pathologic process. Soft tissues and spinal canal: No prevertebral fluid or swelling. No visible canal hematoma. Disc levels:  No evidence of high-grade spinal canal stenosis. Upper chest: Negative. Other: None IMPRESSION: 1. No acute intracranial abnormality. 2. No acute fracture or traumatic malalignment of the cervical spine. 3. 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 Correlate with visual exam and consider opthalmologic consultation. Electronically Signed   By: Lorenza Cambridge M.D.   On: 04/02/2023 14:50   CT Cervical Spine Wo  Contrast  Result Date: 04/02/2023 CLINICAL DATA:  Mental status change, unknown cause; Neck trauma, midline tenderness (Age 39-64y) EXAM:  CT HEAD WITHOUT CONTRAST CT CERVICAL SPINE WITHOUT CONTRAST TECHNIQUE: Multidetector CT imaging of the head and cervical spine was performed following the standard protocol without intravenous contrast. Multiplanar CT image reconstructions of the cervical spine were also generated. RADIATION DOSE REDUCTION: This exam was performed according to the departmental dose-optimization program which includes automated exposure control, adjustment of the mA and/or kV according to patient size and/or use of iterative reconstruction technique. COMPARISON:  CT Head 09/14/06 FINDINGS: CT HEAD FINDINGS Brain: No hemorrhage. No hydrocephalus. No extra-axial fluid collection. No CT evidence of an acute cortical infarct. No mass effect. No mass lesion. Vascular: No hyperdense vessel or unexpected calcification. Skull: Normal. Negative for fracture or focal lesion. Sinuses/Orbits: No middle ear or mastoid effusion. Paranasal sinuses are clear. The left lens is posteriorly dislocated. Left-sided scleral band in place. Other: None. CT CERVICAL SPINE FINDINGS Alignment: Grade 1 anterolisthesis of C4 on C5. Trace retrolisthesis of C5 on C6. Skull base and vertebrae: No acute fracture. No primary bone lesion or focal pathologic process. Soft tissues and spinal canal: No prevertebral fluid or swelling. No visible canal hematoma. Disc levels:  No evidence of high-grade spinal canal stenosis. Upper chest: Negative. Other: None IMPRESSION: 1. No acute intracranial abnormality. 2. No acute fracture or traumatic malalignment of the cervical spine. 3. 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 Correlate with visual exam and consider opthalmologic consultation. Electronically Signed   By: Lorenza Cambridge M.D.   On: 04/02/2023 14:50   DG Chest Port 1 View  Result Date: 04/02/2023 CLINICAL DATA:   weakness EXAM: PORTABLE CHEST 1 VIEW COMPARISON:  Same day chest radiograph, September 07, 2022. FINDINGS: The cardiomediastinal silhouette is unchanged in contour. No pleural effusion. No pneumothorax. No acute pleuroparenchymal abnormality. IMPRESSION: No acute cardiopulmonary abnormality. Electronically Signed   By: Meda Klinefelter M.D.   On: 04/02/2023 13:56   DG Chest 2 View  Result Date: 04/02/2023 CLINICAL DATA:  Shortness of breath.  Dizziness. EXAM: CHEST - 2 VIEW COMPARISON:  09/10/2022. FINDINGS: Bilateral lung fields are clear. Bilateral costophrenic angles are clear. Normal cardio-mediastinal silhouette. No acute osseous abnormalities. The soft tissues are within normal limits. IMPRESSION: No active cardiopulmonary disease. Electronically Signed   By: Jules Schick M.D.   On: 04/02/2023 12:09    Procedures Procedures    Medications Ordered in ED Medications  lactated ringers bolus 1,000 mL (0 mLs Intravenous Stopped 04/02/23 1457)    ED Course/ Medical Decision Making/ A&P                                 Medical Decision Making Problems Addressed: AKI (acute kidney injury) [facility]): acute illness or injury Anemia, unspecified type: acute illness or injury Lightheadedness: acute illness or injury with systemic symptoms that poses a threat to life or bodily functions Near syncope: acute illness or injury with systemic symptoms Syncope and collapse: acute illness or injury with systemic symptoms that poses a threat to life or bodily functions Thrombocytopenia (HCC): acute illness or injury  Amount and/or Complexity of Data Reviewed External Data Reviewed: labs and notes. Labs: ordered. Decision-making details documented in ED Course. Radiology: ordered and independent interpretation performed. Decision-making details documented in ED Course. ECG/medicine tests: ordered and independent interpretation performed. Decision-making details documented in ED Course. Discussion of  management or test interpretation with external provider(s): medicine  Risk Decision regarding hospitalization.   Iv ns. Continuous pulse ox and cardiac monitoring.  Labs ordered/sent. Imaging ordered.   Differential diagnosis includes dehydration, aki, etc. Dispo decision including potential need for admission considered - will get labs and imaging and reassess.   Reviewed nursing notes and prior charts for additional history. External reports reviewed.  Cardiac monitor: sinus rhythm, rate 80.  Labs reviewed/interpreted by me - wbc normal. Hbg 12, decreased from prior, stool grossly neg. Chem w Bernerd Limbo. LR bolus.   Xrays reviewed/interpreted by me - no pna.   CT reviewed/interpreted by me - no hem.  Given aki, weakness, recurrent syncope/near syncope, will consult medicine for admission.   Hospitalists consulted for admission.          Final Clinical Impression(s) / ED Diagnoses Final diagnoses:  Syncope and collapse  Near syncope  Lightheadedness  AKI (acute kidney injury) (HCC)  Thrombocytopenia (HCC)  Anemia, unspecified type    Rx / DC Orders ED Discharge Orders     None         Cathren Laine, MD 04/02/23 1503

## 2023-04-02 NOTE — Plan of Care (Signed)
Attempted to call patient's wife for update and more questions but no answer.

## 2023-04-03 ENCOUNTER — Observation Stay (HOSPITAL_BASED_OUTPATIENT_CLINIC_OR_DEPARTMENT_OTHER): Payer: BC Managed Care – PPO

## 2023-04-03 DIAGNOSIS — D696 Thrombocytopenia, unspecified: Secondary | ICD-10-CM

## 2023-04-03 DIAGNOSIS — R7401 Elevation of levels of liver transaminase levels: Secondary | ICD-10-CM

## 2023-04-03 DIAGNOSIS — D649 Anemia, unspecified: Secondary | ICD-10-CM

## 2023-04-03 DIAGNOSIS — R55 Syncope and collapse: Secondary | ICD-10-CM | POA: Diagnosis not present

## 2023-04-03 DIAGNOSIS — R008 Other abnormalities of heart beat: Secondary | ICD-10-CM

## 2023-04-03 DIAGNOSIS — N179 Acute kidney failure, unspecified: Secondary | ICD-10-CM

## 2023-04-03 LAB — ECHOCARDIOGRAM COMPLETE
AR max vel: 2.42 cm2
AV Peak grad: 9 mmHg
Ao pk vel: 1.5 m/s
Area-P 1/2: 3.53 cm2
Height: 73 in
MV M vel: 2.73 m/s
MV Peak grad: 29.8 mmHg
S' Lateral: 3.7 cm
Weight: 3360 oz

## 2023-04-03 LAB — COMPREHENSIVE METABOLIC PANEL
ALT: 172 U/L — ABNORMAL HIGH (ref 0–44)
AST: 340 U/L — ABNORMAL HIGH (ref 15–41)
Albumin: 2 g/dL — ABNORMAL LOW (ref 3.5–5.0)
Alkaline Phosphatase: 519 U/L — ABNORMAL HIGH (ref 38–126)
Anion gap: 11 (ref 5–15)
BUN: 7 mg/dL (ref 6–20)
CO2: 22 mmol/L (ref 22–32)
Calcium: 7.9 mg/dL — ABNORMAL LOW (ref 8.9–10.3)
Chloride: 98 mmol/L (ref 98–111)
Creatinine, Ser: 2.08 mg/dL — ABNORMAL HIGH (ref 0.61–1.24)
GFR, Estimated: 37 mL/min — ABNORMAL LOW (ref >60)
Glucose, Bld: 118 mg/dL — ABNORMAL HIGH (ref 70–99)
Potassium: 3.2 mmol/L — ABNORMAL LOW (ref 3.5–5.1)
Sodium: 131 mmol/L — ABNORMAL LOW (ref 135–145)
Total Bilirubin: 4.5 mg/dL — ABNORMAL HIGH (ref 0.3–1.2)
Total Protein: 5.4 g/dL — ABNORMAL LOW (ref 6.5–8.1)

## 2023-04-03 LAB — CBC
HCT: 28.7 % — ABNORMAL LOW (ref 39.0–52.0)
Hemoglobin: 10.4 g/dL — ABNORMAL LOW (ref 13.0–17.0)
MCH: 34.6 pg — ABNORMAL HIGH (ref 26.0–34.0)
MCHC: 36.2 g/dL — ABNORMAL HIGH (ref 30.0–36.0)
MCV: 95.3 fL (ref 80.0–100.0)
Platelets: 62 10*3/uL — ABNORMAL LOW (ref 150–400)
RBC: 3.01 MIL/uL — ABNORMAL LOW (ref 4.22–5.81)
RDW: 17.1 % — ABNORMAL HIGH (ref 11.5–15.5)
WBC: 2.9 10*3/uL — ABNORMAL LOW (ref 4.0–10.5)
nRBC: 0 % (ref 0.0–0.2)

## 2023-04-03 LAB — RENAL FUNCTION PANEL
Albumin: 2 g/dL — ABNORMAL LOW (ref 3.5–5.0)
Anion gap: 10 (ref 5–15)
BUN: 7 mg/dL (ref 6–20)
CO2: 24 mmol/L (ref 22–32)
Calcium: 8.2 mg/dL — ABNORMAL LOW (ref 8.9–10.3)
Chloride: 98 mmol/L (ref 98–111)
Creatinine, Ser: 2.02 mg/dL — ABNORMAL HIGH (ref 0.61–1.24)
GFR, Estimated: 38 mL/min — ABNORMAL LOW (ref >60)
Glucose, Bld: 117 mg/dL — ABNORMAL HIGH (ref 70–99)
Phosphorus: 2.1 mg/dL — ABNORMAL LOW (ref 2.5–4.6)
Potassium: 3.2 mmol/L — ABNORMAL LOW (ref 3.5–5.1)
Sodium: 132 mmol/L — ABNORMAL LOW (ref 135–145)

## 2023-04-03 LAB — HEPATITIS PANEL, ACUTE
HCV Ab: NONREACTIVE
Hep A IgM: NONREACTIVE
Hep B C IgM: NONREACTIVE
Hepatitis B Surface Ag: NONREACTIVE

## 2023-04-03 LAB — CK: Total CK: 395 U/L (ref 49–397)

## 2023-04-03 LAB — PHOSPHORUS: Phosphorus: 2.2 mg/dL — ABNORMAL LOW (ref 2.5–4.6)

## 2023-04-03 MED ORDER — POTASSIUM CHLORIDE CRYS ER 20 MEQ PO TBCR
40.0000 meq | EXTENDED_RELEASE_TABLET | Freq: Two times a day (BID) | ORAL | Status: AC
  Administered 2023-04-03 – 2023-04-04 (×2): 40 meq via ORAL
  Filled 2023-04-03 (×2): qty 2

## 2023-04-03 NOTE — Plan of Care (Signed)
  Problem: Education: Goal: Understanding of cardiac disease, CV risk reduction, and recovery process will improve Outcome: Progressing   Problem: Activity: Goal: Ability to tolerate increased activity will improve Outcome: Progressing   Problem: Cardiac: Goal: Ability to achieve and maintain adequate cardiovascular perfusion will improve Outcome: Progressing   Problem: Health Behavior/Discharge Planning: Goal: Ability to safely manage health-related needs after discharge will improve Outcome: Progressing   Problem: Activity: Goal: Ability to return to baseline activity level will improve Outcome: Progressing   Problem: Cardiovascular: Goal: Ability to achieve and maintain adequate cardiovascular perfusion will improve Outcome: Progressing   Problem: Health Behavior/Discharge Planning: Goal: Ability to safely manage health-related needs after discharge will improve Outcome: Progressing   Problem: Education: Goal: Knowledge of General Education information will improve Description: Including pain rating scale, medication(s)/side effects and non-pharmacologic comfort measures Outcome: Progressing   Problem: Health Behavior/Discharge Planning: Goal: Ability to manage health-related needs will improve Outcome: Progressing   Problem: Clinical Measurements: Goal: Ability to maintain clinical measurements within normal limits will improve Outcome: Progressing   Problem: Activity: Goal: Risk for activity intolerance will decrease Outcome: Progressing   Problem: Nutrition: Goal: Adequate nutrition will be maintained Outcome: Progressing   Problem: Coping: Goal: Level of anxiety will decrease Outcome: Progressing   Problem: Elimination: Goal: Will not experience complications related to bowel motility Outcome: Progressing   Problem: Pain Managment: Goal: General experience of comfort will improve Outcome: Progressing   Problem: Safety: Goal: Ability to remain free  from injury will improve Outcome: Progressing   Problem: Skin Integrity: Goal: Risk for impaired skin integrity will decrease Outcome: Progressing

## 2023-04-03 NOTE — Progress Notes (Signed)
Echocardiogram 2D Echocardiogram has been performed.  Jeffrey Hodges 04/03/2023, 10:17 AM

## 2023-04-03 NOTE — Discharge Instructions (Addendum)
Please follow up with your primary doctor within a week to monitor your blood pressure.  I have stopped both of your blood pressure medications for now since they were likely lowering your pressures too much and causing your symptoms. Follow up as early as possible with the GI doctor regarding your liver disease.

## 2023-04-03 NOTE — Progress Notes (Signed)
PROGRESS NOTE  Jeffrey Hodges    DOB: [birth year], 57 y.o.  WUJ:[id number]    Code Status: Full Code   DOA: 04/02/2023   LOS: 0   Brief hospital course  Jeffrey Hodges is a 57 y.o. male with PMH of CAD s/p PCI and stent in 08/2022, HTN, anxiety and depression presenting with dizziness, shortness of breath and frequent for for about 5 to 6 months.   Patient reports dizziness, dyspnea with exertion, frequent falls and syncope over the last 5 to 6 months.  No acute change.  He describes the dizziness as lightheadedness.  Reports running to walls, poles and passing out.  Happens almost every week.  Last fall yesterday about 12 PM.  He denies hitting his head but woke up on the ground.  Denies prodrome such as palpitation.  Denies chest pain.  Denies orthopnea, PND or leg edema.  Denies GI, UTI or focal neurosymptoms.  Denies acute vision change.  He called PCP office today and he was advised to come to emergency department.  He denies drinking alcohol.  He denies smoking cigarette but admits to vaping.  He denies recreational drug use.  Likes to have cardiopulmonary resuscitation in an event of sudden cardiopulmonary arrest.   In ED, stable vitals although soft blood pressures. Na 132. Cr 2.5 (0.95 in 08/2022).  BUN 13.  CO2 18.  Hgb 12.3.  Platelets 117.  Hemoccult negative.  EKG sinus tachycardia to 107 with nonspecific ST and T wave changes.  CT head without acute finding but question about dislocated left lens.  Received IV fluid bolus.  Renal US, LFT and troponin ordered.  Hospital service called for admission for recurrent syncope and possible AKI  04/03/23 -clinically stable and asymptomatic. Lab draw delays delayed care.   Assessment & Plan  Principal Problem:   Syncope and collapse Active Problems:   Primary hypertension   Anxiety and depression   History of CAD (coronary artery disease)  Syncope and collapse: Nonacute.  Recurrent episodes since he had non-STEMI.  Looks like orthostatic based  on patient's description. Hemodynamically stable off his home antihypertensives point to this being contributing to his symptoms. Echo unremarkable.  -Hold home antihypertensive meds. -Fall precaution -PT/OT eval    Elevated liver enzymes: No GI symptoms.  No RUQ tenderness.  Improving today. AST 408>340, ALT 202>172, alk phos 577>519. However, bilirubin increased 3.8> 4.5. hepatitis panel pending. CK 483, ethanol 13. Platelets 117>62 RUQ US revealed: 1. Gallbladder dilatation with free wall thickness slightly prominent measuring 3.5 mm. There are no stones or positive sonographic Murphy's sign. 2. Findings could be due to chronic versus pain-medicated acalculous cholecystitis, reactive wall thickening from an adjacent inflammatory process, or hepatic dysfunction. 3. Diffuse fatty replacement of the liver with hyperechoic parenchyma. - consult GI -Hold statin   History of CAD s/p stent in 08/2022: Patient denies chest pain but dyspnea with exertion.  Slightly elevated troponin to 61.  Reports good compliance with his medications including aspirin and Brilinta. -Resume home Brilinta and aspirin. -Hold statin and antihypertensive meds   AKI- Cr 2.5 (from 0.95 in 08/2022). Improving. Cr 2.1. Denies NSAID use.  Renal ultrasound did not show hydronephrosis.  Received 2 L LR boluses in ED - CMP am -Strict intake and output   Anxiety and depression: He states that he was recently started on Valium that he has not been taking. -Continue home meds after med rec   Thrombocytopenia: Platelet 117>62.  Was 207 in 08/2022. 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 Correlate with visual exam and consider opthalmologic consultation. Patient states that this is not new. No changes in vision.   Body mass index is 27.71 kg/m.  VTE ppx: heparin injection 5,000 Units Start: 04/02/23 1600  Diet:     Diet   Diet Heart Room service  appropriate? Yes; Fluid consistency: Thin   Consultants: GI  Subjective 04/03/23    Pt reports no complaints. He feels at his baseline.    Objective   Vitals:   04/02/23 2118 04/03/23 0017 04/03/23 0458 04/03/23 0744  BP: 120/67 121/77 (!) 136/90 128/87  Pulse: 85 82 80 64  Resp: 17 18 18 18   Temp: 98.1 F (36.7 C) 98.2 F (36.8 C) 98.7 F (37.1 C) 97.9 F (36.6 C)  TempSrc: Oral Oral  Oral  SpO2: 100% 100% 97% 100%  Weight:      Height:        Intake/Output Summary (Last 24 hours) at 04/03/2023 0801 Last data filed at 04/03/2023 0648 Gross per 24 hour  Intake 3498.78 ml  Output 675 ml  Net 2823.78 ml   Filed Weights   04/02/23 1053  Weight: 95.3 kg     Physical Exam:  General: awake, alert, NAD HEENT: atraumatic, clear conjunctiva, anicteric sclera, MMM, hearing grossly normal Respiratory: normal respiratory effort. Cardiovascular: quick capillary refill, normal S1/S2, RRR, no JVD, murmurs Gastrointestinal: soft, NT, ND Nervous: A&O x3. no gross focal neurologic deficits, normal speech Extremities: moves all equally, no edema, normal tone Skin: dry, intact, normal temperature, normal color. No rashes, lesions or ulcers on exposed skin Psychiatry: normal mood, congruent affect  Labs   I have personally reviewed the following labs and imaging studies CBC    Component Value Date/Time   WBC 5.2 04/02/2023 1057   RBC 3.53 (L) 04/02/2023 1057   HGB 12.3 (L) 04/02/2023 1057   HCT 34.9 (L) 04/02/2023 1057   PLT 117 (L) 04/02/2023 1057   MCV 98.9 04/02/2023 1057   MCH 34.8 (H) 04/02/2023 1057   MCHC 35.2 04/02/2023 1057   RDW 17.5 (H) 04/02/2023 1057   LYMPHSABS 0.7 09/09/2022 1448   MONOABS 0.6 09/09/2022 1448   EOSABS 0.2 09/09/2022 1448   BASOSABS 0.0 09/09/2022 1448      Latest Ref Rng & Units 04/02/2023    6:52 PM 04/02/2023   10:57 AM 09/12/2022    1:49 AM  BMP  Glucose 70 - 99 mg/dL  086  578   BUN 6 - 20 mg/dL  13  10   Creatinine 4.69 - 1.24  mg/dL 6.29  5.28  4.13   Sodium 135 - 145 mmol/L  132  134   Potassium 3.5 - 5.1 mmol/L  3.9  3.5   Chloride 98 - 111 mmol/L  97  100   CO2 22 - 32 mmol/L  18  25   Calcium 8.9 - 10.3 mg/dL  8.3  8.8     US Abdomen Limited RUQ (LIVER/GB)  Result Date: 04/03/2023 CLINICAL DATA:  [id number] with elevated liver enzymes. EXAM: ULTRASOUND ABDOMEN LIMITED RIGHT UPPER QUADRANT COMPARISON:  None Available. FINDINGS: Gallbladder: There is dilatation of the gallbladder up to 12 cm in length. The free wall thickness slightly prominent measuring 3.5 mm. There are no stones or positive sonographic Murphy's sign. Common bile duct: Diameter: 4.7 mm, within normal limits with no intrahepatic biliary prominence. Liver: No focal lesion identified. The liver is diffusely attenuating consistent  with generalized fatty replacement, likely mild-to-moderate in degree. Portal vein is patent on color Doppler imaging with normal direction of blood flow towards the liver. Other: No right upper quadrant ascites. IMPRESSION: 1. Gallbladder dilatation with free wall thickness slightly prominent measuring 3.5 mm. There are no stones or positive sonographic Murphy's sign. 2. Findings could be due to chronic versus pain-medicated acalculous cholecystitis, reactive wall thickening from an adjacent inflammatory process, or hepatic dysfunction. 3. Diffuse fatty replacement of the liver with hyperechoic parenchyma. Electronically Signed   By: Almira Bar M.D.   On: 04/03/2023 00:20   US Renal  Result Date: 04/02/2023 CLINICAL DATA:  elevated creatnine, r/o obstructive uropathy EXAM: RENAL / URINARY TRACT ULTRASOUND COMPLETE COMPARISON:  None Available. FINDINGS: Right Kidney: Renal measurements: 10.2 x 5.8 x 5.8 cm = volume: 178 mL. Echogenicity within normal limits. No mass or hydronephrosis visualized. Left Kidney: Renal measurements: 10.5 x 5.8 x 5.5 cm = volume: 175 mL. Echogenicity within normal limits. No mass or hydronephrosis  visualized. Bladder: Appears normal for degree of bladder distention. Other: None. IMPRESSION: No hydronephrosis. Electronically Signed   By: Meda Klinefelter M.D.   On: 04/02/2023 16:10   CT Head Wo Contrast  Result Date: 04/02/2023 CLINICAL DATA:  Mental status change, unknown cause; Neck trauma, midline tenderness (Age 23-64y) EXAM: CT HEAD WITHOUT CONTRAST CT CERVICAL SPINE WITHOUT CONTRAST TECHNIQUE: Multidetector CT imaging of the head and cervical spine was performed following the standard protocol without intravenous contrast. Multiplanar CT image reconstructions of the cervical spine were also generated. RADIATION DOSE REDUCTION: This exam was performed according to the departmental dose-optimization program which includes automated exposure control, adjustment of the mA and/or kV according to patient size and/or use of iterative reconstruction technique. COMPARISON:  CT Head 09/14/06 FINDINGS: CT HEAD FINDINGS Brain: No hemorrhage. No hydrocephalus. No extra-axial fluid collection. No CT evidence of an acute cortical infarct. No mass effect. No mass lesion. Vascular: No hyperdense vessel or unexpected calcification. Skull: Normal. Negative for fracture or focal lesion. Sinuses/Orbits: No middle ear or mastoid effusion. Paranasal sinuses are clear. The left lens is posteriorly dislocated. Left-sided scleral band in place. Other: None. CT CERVICAL SPINE FINDINGS Alignment: Grade 1 anterolisthesis of C4 on C5. Trace retrolisthesis of C5 on C6. Skull base and vertebrae: No acute fracture. No primary bone lesion or focal pathologic process. Soft tissues and spinal canal: No prevertebral fluid or swelling. No visible canal hematoma. Disc levels:  No evidence of high-grade spinal canal stenosis. Upper chest: Negative. Other: None IMPRESSION: 1. No acute intracranial abnormality. 2. No acute fracture or traumatic malalignment of the cervical spine. 3. 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  Correlate with visual exam and consider opthalmologic consultation. Electronically Signed   By: Lorenza Cambridge M.D.   On: 04/02/2023 14:50   CT Cervical Spine Wo Contrast  Result Date: 04/02/2023 CLINICAL DATA:  Mental status change, unknown cause; Neck trauma, midline tenderness (Age 23-64y) EXAM: CT HEAD WITHOUT CONTRAST CT CERVICAL SPINE WITHOUT CONTRAST TECHNIQUE: Multidetector CT imaging of the head and cervical spine was performed following the standard protocol without intravenous contrast. Multiplanar CT image reconstructions of the cervical spine were also generated. RADIATION DOSE REDUCTION: This exam was performed according to the departmental dose-optimization program which includes automated exposure control, adjustment of the mA and/or kV according to patient size and/or use of iterative reconstruction technique. COMPARISON:  CT Head 09/14/06 FINDINGS: CT HEAD FINDINGS Brain: No hemorrhage. No hydrocephalus. No extra-axial fluid collection. No CT  evidence of an acute cortical infarct. No mass effect. No mass lesion. Vascular: No hyperdense vessel or unexpected calcification. Skull: Normal. Negative for fracture or focal lesion. Sinuses/Orbits: No middle ear or mastoid effusion. Paranasal sinuses are clear. The left lens is posteriorly dislocated. Left-sided scleral band in place. Other: None. CT CERVICAL SPINE FINDINGS Alignment: Grade 1 anterolisthesis of C4 on C5. Trace retrolisthesis of C5 on C6. Skull base and vertebrae: No acute fracture. No primary bone lesion or focal pathologic process. Soft tissues and spinal canal: No prevertebral fluid or swelling. No visible canal hematoma. Disc levels:  No evidence of high-grade spinal canal stenosis. Upper chest: Negative. Other: None IMPRESSION: 1. No acute intracranial abnormality. 2. No acute fracture or traumatic malalignment of the cervical spine. 3. 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 Correlate with visual exam and consider  opthalmologic consultation. Electronically Signed   By: Lorenza Cambridge M.D.   On: 04/02/2023 14:50   DG Chest Port 1 View  Result Date: 04/02/2023 CLINICAL DATA:  weakness EXAM: PORTABLE CHEST 1 VIEW COMPARISON:  Same day chest radiograph, September 07, 2022. FINDINGS: The cardiomediastinal silhouette is unchanged in contour. No pleural effusion. No pneumothorax. No acute pleuroparenchymal abnormality. IMPRESSION: No acute cardiopulmonary abnormality. Electronically Signed   By: Meda Klinefelter M.D.   On: 04/02/2023 13:56   DG Chest 2 View  Result Date: 04/02/2023 CLINICAL DATA:  Shortness of breath.  Dizziness. EXAM: CHEST - 2 VIEW COMPARISON:  09/10/2022. FINDINGS: Bilateral lung fields are clear. Bilateral costophrenic angles are clear. Normal cardio-mediastinal silhouette. No acute osseous abnormalities. The soft tissues are within normal limits. IMPRESSION: No active cardiopulmonary disease. Electronically Signed   By: Jules Schick M.D.   On: 04/02/2023 12:09    Disposition Plan & Communication  Patient status: Observation  Admitted From: Home Planned disposition location: Home Anticipated discharge date: 9/21 pending GI evaluation   Family Communication: none at bedside    Author: Leeroy Bock, DO [facility] 04/03/2023, 8:01 AM   Available by Epic secure chat 7AM-7PM. If 7PM-7AM, please contact night-coverage.  [facility] contact information found on [email or web address].

## 2023-04-04 ENCOUNTER — Telehealth: Payer: Self-pay

## 2023-04-04 ENCOUNTER — Observation Stay (HOSPITAL_COMMUNITY): Payer: BC Managed Care – PPO

## 2023-04-04 ENCOUNTER — Other Ambulatory Visit: Payer: Self-pay

## 2023-04-04 DIAGNOSIS — R55 Syncope and collapse: Secondary | ICD-10-CM | POA: Diagnosis not present

## 2023-04-04 DIAGNOSIS — D696 Thrombocytopenia, unspecified: Secondary | ICD-10-CM | POA: Diagnosis not present

## 2023-04-04 DIAGNOSIS — N179 Acute kidney failure, unspecified: Secondary | ICD-10-CM

## 2023-04-04 DIAGNOSIS — D509 Iron deficiency anemia, unspecified: Secondary | ICD-10-CM

## 2023-04-04 DIAGNOSIS — K760 Fatty (change of) liver, not elsewhere classified: Secondary | ICD-10-CM | POA: Diagnosis not present

## 2023-04-04 DIAGNOSIS — R748 Abnormal levels of other serum enzymes: Secondary | ICD-10-CM

## 2023-04-04 LAB — COMPREHENSIVE METABOLIC PANEL
ALT: 150 U/L — ABNORMAL HIGH (ref 0–44)
AST: 302 U/L — ABNORMAL HIGH (ref 15–41)
Albumin: 1.8 g/dL — ABNORMAL LOW (ref 3.5–5.0)
Alkaline Phosphatase: 532 U/L — ABNORMAL HIGH (ref 38–126)
Anion gap: 9 (ref 5–15)
BUN: 5 mg/dL — ABNORMAL LOW (ref 6–20)
CO2: 21 mmol/L — ABNORMAL LOW (ref 22–32)
Calcium: 7.8 mg/dL — ABNORMAL LOW (ref 8.9–10.3)
Chloride: 101 mmol/L (ref 98–111)
Creatinine, Ser: 1.92 mg/dL — ABNORMAL HIGH (ref 0.61–1.24)
GFR, Estimated: 41 mL/min — ABNORMAL LOW (ref >60)
Glucose, Bld: 91 mg/dL (ref 70–99)
Potassium: 3.5 mmol/L (ref 3.5–5.1)
Sodium: 131 mmol/L — ABNORMAL LOW (ref 135–145)
Total Bilirubin: 4.6 mg/dL — ABNORMAL HIGH (ref 0.3–1.2)
Total Protein: 5.1 g/dL — ABNORMAL LOW (ref 6.5–8.1)

## 2023-04-04 LAB — CBC
HCT: 27.2 % — ABNORMAL LOW (ref 39.0–52.0)
Hemoglobin: 9.8 g/dL — ABNORMAL LOW (ref 13.0–17.0)
MCH: 34.9 pg — ABNORMAL HIGH (ref 26.0–34.0)
MCHC: 36 g/dL (ref 30.0–36.0)
MCV: 96.8 fL (ref 80.0–100.0)
Platelets: 54 10*3/uL — ABNORMAL LOW (ref 150–400)
RBC: 2.81 MIL/uL — ABNORMAL LOW (ref 4.22–5.81)
RDW: 17.2 % — ABNORMAL HIGH (ref 11.5–15.5)
WBC: 2.8 10*3/uL — ABNORMAL LOW (ref 4.0–10.5)
nRBC: 0 % (ref 0.0–0.2)

## 2023-04-04 LAB — AMMONIA: Ammonia: 43 umol/L — ABNORMAL HIGH (ref 9–35)

## 2023-04-04 MED ORDER — TECHNETIUM TC 99M MEBROFENIN IV KIT
7.6000 | PACK | Freq: Once | INTRAVENOUS | Status: AC | PRN
  Administered 2023-04-04: 7.6 via INTRAVENOUS

## 2023-04-04 NOTE — Telephone Encounter (Signed)
-----   Message from Mike Gip sent at 04/04/2023  1:56 PM EDT ----- Regarding: labs and office follow up [facility], this patient was seen in consultation today, and is being discharged He needs to have labs done in 2 weeks-that should include CBC with differential, c-Met, and pro time/INR, and CDT  He needs office follow-up with Dr. Tomasa Rand or myself -please get him an office appointment  Please call him on Monday or Tuesday regarding the labs, and office appointment  Elevated LFTs, fatty liver disease possible cirrhosis

## 2023-04-04 NOTE — Progress Notes (Incomplete)
PROGRESS NOTE  Jeffrey Hodges    DOB: August 08, 1965, 57 y.o.  ZOX:[id number]    Code Status: Full Code   DOA: 04/02/2023   LOS: 0   Brief hospital course  Jeffrey Hodges is a 57 y.o. male with PMH of CAD s/p PCI and stent in 08/2022, HTN, anxiety and depression presenting with dizziness, shortness of breath and frequent for for about 5 to 6 months.   Patient reports dizziness, dyspnea with exertion, frequent falls and syncope over the last 5 to 6 months.  No acute change.  He describes the dizziness as lightheadedness.  Reports running to walls, poles and passing out.  Happens almost every week.  Last fall yesterday about 12 PM.  He denies hitting his head but woke up on the ground.  Denies prodrome such as palpitation.  Denies chest pain.  Denies orthopnea, PND or leg edema.  Denies GI, UTI or focal neurosymptoms.  Denies acute vision change.  He called PCP office today and he was advised to come to emergency department.  He denies drinking alcohol.  He denies smoking cigarette but admits to vaping.  He denies recreational drug use.  Likes to have cardiopulmonary resuscitation in an event of sudden cardiopulmonary arrest.   In ED, stable vitals although soft blood pressures. Na 132. Cr 2.5 (0.95 in 08/2022).  BUN 13.  CO2 18.  Hgb 12.3.  Platelets 117.  Hemoccult negative.  EKG sinus tachycardia to 107 with nonspecific ST and T wave changes.  CT head without acute finding but question about dislocated left lens.  Received IV fluid bolus.  Renal US, LFT and troponin ordered.  Hospital service called for admission for recurrent syncope and possible AKI  04/04/23 -clinically stable and asymptomatic. Lab draw delays delayed care.   Assessment & Plan  Principal Problem:   Syncope and collapse Active Problems:   Primary hypertension   Anxiety and depression   History of CAD (coronary artery disease)   AKI (acute kidney injury) (HCC)   Thrombocytopenia (HCC)   Anemia   Transaminitis  Syncope and  collapse: Nonacute.  Recurrent episodes since he had non-STEMI.  Looks like orthostatic based on patient's description. Hemodynamically stable off his home antihypertensives point to this being contributing to his symptoms. Echo unremarkable.  -Hold home antihypertensive meds. -Fall precaution -PT/OT eval    Elevated liver enzymes: No GI symptoms.  No RUQ tenderness.  Improving today. AST 408>340, ALT 202>172, alk phos 577>519. However, bilirubin increased 3.8> 4.5. hepatitis panel pending. CK 483, ethanol 13. Platelets 117>62 RUQ US revealed: 1. Gallbladder dilatation with free wall thickness slightly prominent measuring 3.5 mm. There are no stones or positive sonographic Murphy's sign. 2. Findings could be due to chronic versus pain-medicated acalculous cholecystitis, reactive wall thickening from an adjacent inflammatory process, or hepatic dysfunction. 3. Diffuse fatty replacement of the liver with hyperechoic parenchyma. - consult GI -Hold statin   History of CAD s/p stent in 08/2022: Patient denies chest pain but dyspnea with exertion.  Slightly elevated troponin to 61.  Reports good compliance with his medications including aspirin and Brilinta. -Resume home Brilinta and aspirin. -Hold statin and antihypertensive meds   AKI- Cr 2.5 (from 0.95 in 08/2022). Improving. Cr 2.1. Denies NSAID use.  Renal ultrasound did not show hydronephrosis.  Received 2 L LR boluses in ED - CMP am -Strict intake and output   Anxiety and depression: He states that he was recently started on Valium that he has not been taking. -Continue  home meds after med rec   Thrombocytopenia: Platelet 117>62.  Was 207 in 08/2022. 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 Correlate with visual exam and consider opthalmologic consultation. Patient states that this is not new. No changes in vision.   Body mass index is 27.71 kg/m.  VTE ppx:  heparin injection 5,000 Units Start: 04/02/23 1600  Diet:     Diet   Diet Heart Room service appropriate? Yes; Fluid consistency: Thin   Consultants: GI  Subjective 04/04/23    Pt reports no complaints. He feels at his baseline.    Objective   Vitals:   04/03/23 1448 04/03/23 2002 04/04/23 0038 04/04/23 0414  BP: 133/88 (!) 139/92 (!) 143/95 133/87  Pulse: (!) 54 66 66 76  Resp: 18 18 18 18   Temp:  98.2 F (36.8 C) 98 F (36.7 C) 98.1 F (36.7 C)  TempSrc:  Oral Oral Oral  SpO2: 98% 100% 98% 98%  Weight:      Height:        Intake/Output Summary (Last 24 hours) at 04/04/2023 0714 Last data filed at 04/04/2023 0400 Gross per 24 hour  Intake 678.57 ml  Output 600 ml  Net 78.57 ml   Filed Weights   04/02/23 1053  Weight: 95.3 kg     Physical Exam:  General: awake, alert, NAD HEENT: atraumatic, clear conjunctiva, anicteric sclera, MMM, hearing grossly normal Respiratory: normal respiratory effort. Cardiovascular: quick capillary refill, normal S1/S2, RRR, no JVD, murmurs Gastrointestinal: soft, NT, ND Nervous: A&O x3. no gross focal neurologic deficits, normal speech Extremities: moves all equally, no edema, normal tone Skin: dry, intact, normal temperature, normal color. No rashes, lesions or ulcers on exposed skin Psychiatry: normal mood, congruent affect  Labs   I have personally reviewed the following labs and imaging studies CBC    Component Value Date/Time   WBC 2.8 (L) 04/04/2023 0557   RBC 2.81 (L) 04/04/2023 0557   HGB 9.8 (L) 04/04/2023 0557   HCT 27.2 (L) 04/04/2023 0557   PLT 54 (L) 04/04/2023 0557   MCV 96.8 04/04/2023 0557   MCH 34.9 (H) 04/04/2023 0557   MCHC 36.0 04/04/2023 0557   RDW 17.2 (H) 04/04/2023 0557   LYMPHSABS 0.7 09/09/2022 1448   MONOABS 0.6 09/09/2022 1448   EOSABS 0.2 09/09/2022 1448   BASOSABS 0.0 09/09/2022 1448      Latest Ref Rng & Units 04/04/2023    5:57 AM 04/03/2023    2:13 PM 04/03/2023    2:12 PM  BMP   Glucose 70 - 99 mg/dL 91  409  811   BUN 6 - 20 mg/dL 5  7  7    Creatinine 0.61 - 1.24 mg/dL 9.14  7.82  9.56   Sodium 135 - 145 mmol/L 131  132  131   Potassium 3.5 - 5.1 mmol/L 3.5  3.2  3.2   Chloride 98 - 111 mmol/L 101  98  98   CO2 22 - 32 mmol/L 21  24  22    Calcium 8.9 - 10.3 mg/dL 7.8  8.2  7.9     ECHOCARDIOGRAM COMPLETE  Result Date: 04/03/2023    ECHOCARDIOGRAM REPORT   Patient Name:   Jeffrey Hodges Date of Exam: 04/03/2023 Medical Rec #:  [id number]     Height:       73.0 in Accession #:    [id number]    Weight:       210.0 lb  Date of Birth:  16-Apr-1966    BSA:          2.197 m Patient Age:    56 years      BP:           136/90 mmHg Patient Gender: M             HR:           62 bpm. Exam Location:  Inpatient Procedure: 2D Echo, Cardiac Doppler and Color Doppler Indications:    Other abnormalities of the heart R008.8  History:        Patient has prior history of Echocardiogram examinations, most                 recent 09/12/2022. Previous Myocardial Infarction and CAD,                 Signs/Symptoms:Syncope; Risk Factors:Hypertension, Dyslipidemia                 and Current Smoker.  Sonographer:    Lucendia Herrlich Referring Phys: [id number] TAYE T GONFA IMPRESSIONS  1. Left ventricular ejection fraction, by estimation, is 55 to 60%. The left ventricle has normal function. The left ventricle has no regional wall motion abnormalities. Left ventricular diastolic parameters were normal.  2. Right ventricular systolic function is normal. The right ventricular size is mildly enlarged.  3. The mitral valve is normal in structure. Trivial mitral valve regurgitation. No evidence of mitral stenosis.  4. The aortic valve is tricuspid. There is mild calcification of the aortic valve. There is mild thickening of the aortic valve. Aortic valve regurgitation is not visualized.  5. The inferior vena cava is dilated in size with >50% respiratory variability, suggesting right atrial pressure of 8 mmHg.  Comparison(s): Prior images reviewed side by side. IVC has dilated since last study. FINDINGS  Left Ventricle: Left ventricular ejection fraction, by estimation, is 55 to 60%. The left ventricle has normal function. The left ventricle has no regional wall motion abnormalities. The left ventricular internal cavity size was normal in size. There is  no left ventricular hypertrophy. Left ventricular diastolic parameters were normal. Right Ventricle: The right ventricular size is mildly enlarged. No increase in right ventricular wall thickness. Right ventricular systolic function is normal. Left Atrium: Left atrial size was normal in size. Right Atrium: Right atrial size was normal in size. Pericardium: There is no evidence of pericardial effusion. Mitral Valve: The mitral valve is normal in structure. Trivial mitral valve regurgitation. No evidence of mitral valve stenosis. Tricuspid Valve: The tricuspid valve is normal in structure. Tricuspid valve regurgitation is not demonstrated. No evidence of tricuspid stenosis. Aortic Valve: The aortic valve is tricuspid. There is mild calcification of the aortic valve. There is mild thickening of the aortic valve. Aortic valve regurgitation is not visualized. Aortic valve peak gradient measures 9.0 mmHg. Pulmonic Valve: The pulmonic valve was normal in structure. Pulmonic valve regurgitation is not visualized. No evidence of pulmonic stenosis. Aorta: The aortic root and ascending aorta are structurally normal, with no evidence of dilitation. Venous: The inferior vena cava is dilated in size with greater than 50% respiratory variability, suggesting right atrial pressure of 8 mmHg. IAS/Shunts: The atrial septum is grossly normal.  LEFT VENTRICLE PLAX 2D LVIDd:         5.20 cm   Diastology LVIDs:         3.70 cm   LV e' medial:    8.24 cm/s LV PW:  0.90 cm   LV E/e' medial:  9.6 LV IVS:        0.80 cm   LV e' lateral:   10.40 cm/s LVOT diam:     2.20 cm   LV E/e' lateral:  7.6 LV SV:         73 LV SV Index:   33 LVOT Area:     3.80 cm  RIGHT VENTRICLE             IVC RV S prime:     15.10 cm/s  IVC diam: 2.40 cm TAPSE (M-mode): 2.0 cm LEFT ATRIUM           Index        RIGHT ATRIUM           Index LA diam:      3.50 cm 1.59 cm/m   RA Area:     14.00 cm LA Vol (A2C): 39.7 ml 18.07 ml/m  RA Volume:   34.90 ml  15.89 ml/m LA Vol (A4C): 43.7 ml 19.89 ml/m  AORTIC VALVE AV Area (Vmax): 2.42 cm AV Vmax:        150.00 cm/s AV Peak Grad:   9.0 mmHg LVOT Vmax:      95.60 cm/s LVOT Vmean:     61.100 cm/s LVOT VTI:       0.191 m  AORTA Ao Root diam: 3.70 cm Ao Asc diam:  3.50 cm MITRAL VALVE               TRICUSPID VALVE MV Area (PHT): 3.53 cm    TR Peak grad:   13.5 mmHg MV Decel Time: 215 msec    TR Vmax:        184.00 cm/s MR Peak grad: 29.8 mmHg MR Vmax:      273.00 cm/s  SHUNTS MV E velocity: 79.10 cm/s  Systemic VTI:  0.19 m MV A velocity: 74.70 cm/s  Systemic Diam: 2.20 cm MV E/A ratio:  1.06 Riley Lam MD Electronically signed by Riley Lam MD Signature Date/Time: 04/03/2023/12:11:06 PM    Final    US Abdomen Limited RUQ (LIVER/GB)  Result Date: 04/03/2023 CLINICAL DATA:  [id number] with elevated liver enzymes. EXAM: ULTRASOUND ABDOMEN LIMITED RIGHT UPPER QUADRANT COMPARISON:  None Available. FINDINGS: Gallbladder: There is dilatation of the gallbladder up to 12 cm in length. The free wall thickness slightly prominent measuring 3.5 mm. There are no stones or positive sonographic Murphy's sign. Common bile duct: Diameter: 4.7 mm, within normal limits with no intrahepatic biliary prominence. Liver: No focal lesion identified. The liver is diffusely attenuating consistent with generalized fatty replacement, likely mild-to-moderate in degree. Portal vein is patent on color Doppler imaging with normal direction of blood flow towards the liver. Other: No right upper quadrant ascites. IMPRESSION: 1. Gallbladder dilatation with free wall thickness slightly prominent  measuring 3.5 mm. There are no stones or positive sonographic Murphy's sign. 2. Findings could be due to chronic versus pain-medicated acalculous cholecystitis, reactive wall thickening from an adjacent inflammatory process, or hepatic dysfunction. 3. Diffuse fatty replacement of the liver with hyperechoic parenchyma. Electronically Signed   By: Almira Bar M.D.   On: 04/03/2023 00:20   US Renal  Result Date: 04/02/2023 CLINICAL DATA:  elevated creatnine, r/o obstructive uropathy EXAM: RENAL / URINARY TRACT ULTRASOUND COMPLETE COMPARISON:  None Available. FINDINGS: Right Kidney: Renal measurements: 10.2 x 5.8 x 5.8 cm = volume: 178 mL. Echogenicity within normal limits. No mass or hydronephrosis visualized. Left Kidney: Renal  measurements: 10.5 x 5.8 x 5.5 cm = volume: 175 mL. Echogenicity within normal limits. No mass or hydronephrosis visualized. Bladder: Appears normal for degree of bladder distention. Other: None. IMPRESSION: No hydronephrosis. Electronically Signed   By: Meda Klinefelter M.D.   On: 04/02/2023 16:10   CT Head Wo Contrast  Result Date: 04/02/2023 CLINICAL DATA:  Mental status change, unknown cause; Neck trauma, midline tenderness (Age 60-64y) EXAM: CT HEAD WITHOUT CONTRAST CT CERVICAL SPINE WITHOUT CONTRAST TECHNIQUE: Multidetector CT imaging of the head and cervical spine was performed following the standard protocol without intravenous contrast. Multiplanar CT image reconstructions of the cervical spine were also generated. RADIATION DOSE REDUCTION: This exam was performed according to the departmental dose-optimization program which includes automated exposure control, adjustment of the mA and/or kV according to patient size and/or use of iterative reconstruction technique. COMPARISON:  CT Head 09/14/06 FINDINGS: CT HEAD FINDINGS Brain: No hemorrhage. No hydrocephalus. No extra-axial fluid collection. No CT evidence of an acute cortical infarct. No mass effect. No mass lesion.  Vascular: No hyperdense vessel or unexpected calcification. Skull: Normal. Negative for fracture or focal lesion. Sinuses/Orbits: No middle ear or mastoid effusion. Paranasal sinuses are clear. The left lens is posteriorly dislocated. Left-sided scleral band in place. Other: None. CT CERVICAL SPINE FINDINGS Alignment: Grade 1 anterolisthesis of C4 on C5. Trace retrolisthesis of C5 on C6. Skull base and vertebrae: No acute fracture. No primary bone lesion or focal pathologic process. Soft tissues and spinal canal: No prevertebral fluid or swelling. No visible canal hematoma. Disc levels:  No evidence of high-grade spinal canal stenosis. Upper chest: Negative. Other: None IMPRESSION: 1. No acute intracranial abnormality. 2. No acute fracture or traumatic malalignment of the cervical spine. 3. 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 Correlate with visual exam and consider opthalmologic consultation. Electronically Signed   By: Lorenza Cambridge M.D.   On: 04/02/2023 14:50   CT Cervical Spine Wo Contrast  Result Date: 04/02/2023 CLINICAL DATA:  Mental status change, unknown cause; Neck trauma, midline tenderness (Age 60-64y) EXAM: CT HEAD WITHOUT CONTRAST CT CERVICAL SPINE WITHOUT CONTRAST TECHNIQUE: Multidetector CT imaging of the head and cervical spine was performed following the standard protocol without intravenous contrast. Multiplanar CT image reconstructions of the cervical spine were also generated. RADIATION DOSE REDUCTION: This exam was performed according to the departmental dose-optimization program which includes automated exposure control, adjustment of the mA and/or kV according to patient size and/or use of iterative reconstruction technique. COMPARISON:  CT Head 09/14/06 FINDINGS: CT HEAD FINDINGS Brain: No hemorrhage. No hydrocephalus. No extra-axial fluid collection. No CT evidence of an acute cortical infarct. No mass effect. No mass lesion. Vascular: No hyperdense vessel or unexpected  calcification. Skull: Normal. Negative for fracture or focal lesion. Sinuses/Orbits: No middle ear or mastoid effusion. Paranasal sinuses are clear. The left lens is posteriorly dislocated. Left-sided scleral band in place. Other: None. CT CERVICAL SPINE FINDINGS Alignment: Grade 1 anterolisthesis of C4 on C5. Trace retrolisthesis of C5 on C6. Skull base and vertebrae: No acute fracture. No primary bone lesion or focal pathologic process. Soft tissues and spinal canal: No prevertebral fluid or swelling. No visible canal hematoma. Disc levels:  No evidence of high-grade spinal canal stenosis. Upper chest: Negative. Other: None IMPRESSION: 1. No acute intracranial abnormality. 2. No acute fracture or traumatic malalignment of the cervical spine. 3. 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 Correlate with visual exam and consider opthalmologic consultation. Electronically Signed   By: Sandria Senter  Celine Mans M.D.   On: 04/02/2023 14:50   DG Chest Port 1 View  Result Date: 04/02/2023 CLINICAL DATA:  weakness EXAM: PORTABLE CHEST 1 VIEW COMPARISON:  Same day chest radiograph, September 07, 2022. FINDINGS: The cardiomediastinal silhouette is unchanged in contour. No pleural effusion. No pneumothorax. No acute pleuroparenchymal abnormality. IMPRESSION: No acute cardiopulmonary abnormality. Electronically Signed   By: Meda Klinefelter M.D.   On: 04/02/2023 13:56   DG Chest 2 View  Result Date: 04/02/2023 CLINICAL DATA:  Shortness of breath.  Dizziness. EXAM: CHEST - 2 VIEW COMPARISON:  09/10/2022. FINDINGS: Bilateral lung fields are clear. Bilateral costophrenic angles are clear. Normal cardio-mediastinal silhouette. No acute osseous abnormalities. The soft tissues are within normal limits. IMPRESSION: No active cardiopulmonary disease. Electronically Signed   By: Jules Schick M.D.   On: 04/02/2023 12:09    Disposition Plan & Communication  Patient status: Observation  Admitted From: Home Planned disposition  location: Home Anticipated discharge date: 9/21 pending GI evaluation   Family Communication: none at bedside    Author: Leeroy Bock, DO [facility] 04/04/2023, 7:14 AM   Available by Epic secure chat 7AM-7PM. If 7PM-7AM, please contact night-coverage.  [facility] contact information found on [email or web address].

## 2023-04-04 NOTE — Plan of Care (Signed)
  Problem: Education: Goal: Understanding of cardiac disease, CV risk reduction, and recovery process will improve Outcome: Completed/Met Goal: Individualized Educational Video(s) Outcome: Completed/Met   Problem: Activity: Goal: Ability to tolerate increased activity will improve Outcome: Completed/Met   Problem: Cardiac: Goal: Ability to achieve and maintain adequate cardiovascular perfusion will improve Outcome: Completed/Met   Problem: Health Behavior/Discharge Planning: Goal: Ability to safely manage health-related needs after discharge will improve Outcome: Completed/Met   Problem: Education: Goal: Understanding of CV disease, CV risk reduction, and recovery process will improve Outcome: Completed/Met Goal: Individualized Educational Video(s) Outcome: Completed/Met   Problem: Activity: Goal: Ability to return to baseline activity level will improve Outcome: Completed/Met   Problem: Cardiovascular: Goal: Ability to achieve and maintain adequate cardiovascular perfusion will improve Outcome: Completed/Met Goal: Vascular access site(s) Level 0-1 will be maintained Outcome: Completed/Met   Problem: Health Behavior/Discharge Planning: Goal: Ability to safely manage health-related needs after discharge will improve Outcome: Completed/Met   Problem: Education: Goal: Knowledge of General Education information will improve Description: Including pain rating scale, medication(s)/side effects and non-pharmacologic comfort measures Outcome: Completed/Met   Problem: Health Behavior/Discharge Planning: Goal: Ability to manage health-related needs will improve Outcome: Completed/Met   Problem: Clinical Measurements: Goal: Ability to maintain clinical measurements within normal limits will improve Outcome: Completed/Met Goal: Will remain free from infection Outcome: Completed/Met Goal: Diagnostic test results will improve Outcome: Completed/Met Goal: Respiratory  complications will improve Outcome: Completed/Met Goal: Cardiovascular complication will be avoided Outcome: Completed/Met   Problem: Activity: Goal: Risk for activity intolerance will decrease Outcome: Completed/Met   Problem: Nutrition: Goal: Adequate nutrition will be maintained Outcome: Completed/Met   Problem: Coping: Goal: Level of anxiety will decrease Outcome: Completed/Met   Problem: Elimination: Goal: Will not experience complications related to bowel motility Outcome: Completed/Met Goal: Will not experience complications related to urinary retention Outcome: Completed/Met   Problem: Pain Managment: Goal: General experience of comfort will improve Outcome: Completed/Met   Problem: Safety: Goal: Ability to remain free from injury will improve Outcome: Completed/Met   Problem: Skin Integrity: Goal: Risk for impaired skin integrity will decrease Outcome: Completed/Met

## 2023-04-04 NOTE — Plan of Care (Signed)

## 2023-04-04 NOTE — Consult Note (Cosign Needed)
Consultation  Referring Provider: [facility]/ Dareen Piano Primary Care Physician:  Eden Emms, NP Primary Gastroenterologist:  unassigned  Reason for Consultation:  Elevated LFT's  HPI: Jeffrey Hodges is a 57 y.o. male who was admitted 2 days ago after he presented to the ER with complaints of 5 to 67-month history of lightheadedness, some dizziness, sensation of shortness of breath and frequent falls at home.  Per the chart these falls are associated with syncope.  He had had a syncopal episode the day of admission and found himself on the floor. Head CT negative other than possible dislocated left lens. Labs on admit with sodium 132/creatinine 2.5/BUN 13 Hemoglobin 12.3/platelets 117, documented Hemoccult negative. LFTs on admission 0.8/direct bili 1.8/indirect bili 2.0/alk phos 517/AST 408/ALT 202. Drug screen ordered and pending EtOH level positive at 13 acute hepatitis panel negative Initial CK elevated 483  Abdominal ultrasound showed a distended gallbladder at 12 cm and gallbladder wall of 3.5 cm, no gallstones no ductal dilation, noted Fuhs fatty replacement of the liver with hyperechoic parenchyma.  HIDA scan today shows no common duct or cystic duct dilation slight delayed clearance of the tracer from the blood pool please correlate with hepatocellular function gallbladder activity visualized consistent with patency of the cystic duct and small bowel activity consistent with patent common bile duct   Labs today hemoglobin 9.8/hematocrit 27.2/platelets 52 Creatinine 1.92 T. bili 4.6/alk phos 532/AST 302/ALT 150  No INR  Patient relates previous history of heavy alcohol use for many years but says that he stopped 16 or 17 years ago.  He denies any regular EtOH use currently.  He had been aware of being told that he had some abnormality with his liver when he had it admission 16 or 17 years ago with what sounds like alcoholic hepatitis.  He did have admission in February 2024 and  underwent PCI and coronary stent. Subsequently placed on aspirin Plavix Lipitor Cozaar/Lopressor.  Lipitor has been discontinued admission  No specific GI complaints, denies any issues with abdominal pain, no nausea or vomiting, no changes in bowel habits, no melena or hematochezia.  He was unaware that he is jaundiced, says that he did notice that his urine was a little bit dark but thought he was dehydrated. Denies any recreational drug use.    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 Authorizing Provider  amLODipine (NORVASC) 2.5 MG tablet Take 1 tablet (2.5 mg total) by mouth daily. 09/20/22  Yes Monge, Petra Kuba, NP  aspirin EC 81 MG tablet Take 1 tablet (81 mg total) by mouth daily. Swallow whole. 09/20/22  Yes Monge, Petra Kuba, NP  atorvastatin (LIPITOR) 80 MG tablet Take 1 tablet (80 mg total) by mouth daily. 09/20/22  Yes Monge,  Petra Kuba, NP  BRILINTA 90 MG TABS tablet Take 90 mg by mouth 2 (two) times daily. 10/05/22  Yes [provider]  busPIRone (BUSPAR) 10 MG tablet TAKE 1 TABLET BY MOUTH TWICE A DAY 01/08/23  Yes Eden Emms, NP  clopidogrel (PLAVIX) 75 MG tablet Take 1 tablet (75 mg total) by mouth daily. 10/04/22  Yes Monge, Petra Kuba, NP  clopidogrel (PLAVIX) 75 MG tablet  Take 4 tablets once as a starting dose of Plavix. Patient not taking: Reported on 04/02/2023 10/04/22   Joylene Grapes, NP  FLUoxetine (PROZAC) 40 MG capsule TAKE 1 CAPSULE (40 MG TOTAL) BY MOUTH DAILY. 12/17/22  Yes Eden Emms, NP  losartan (COZAAR) 25 MG tablet Take 1 tablet (25 mg total) by mouth daily. 09/20/22  Yes Monge, Petra Kuba, NP  nitroGLYCERIN (NITROSTAT) 0.4 MG SL tablet Place 1 tablet (0.4 mg total) under the tongue every 5 (five) minutes x 3 doses as needed for chest pain. 09/20/22  Yes Monge, Petra Kuba, NP  ondansetron (ZOFRAN-ODT) 4 MG disintegrating tablet Take 1 tablet (4 mg total) by mouth every 8 (eight) hours as needed for nausea or vomiting. 10/01/22  Yes Eden Emms, NP  LORazepam (ATIVAN) 0.5 MG tablet TAKE 1/2 OR 1 TABLET BY MOUTH TWICE A DAY AS NEEDED FOR ANXIETY Patient not taking: Reported on 04/02/2023 02/06/23   Eden Emms, NP    Current Facility-Administered Medications  Medication Dose Route Frequency Provider Last Rate Last Admin   busPIRone (BUSPAR) tablet 10 mg  10 mg Oral BID Candelaria Stagers T, MD   10 mg at 04/03/23 2054   FLUoxetine (PROZAC) capsule 40 mg  40 mg Oral Daily Candelaria Stagers T, MD   40 mg at 04/03/23 1109   heparin injection 5,000 Units  5,000 Units Subcutaneous Q8H Candelaria Stagers T, MD   5,000 Units at 04/04/23 0603   ondansetron (ZOFRAN) tablet 4 mg  4 mg Oral Q6H PRN Almon Hercules, MD       Or   ondansetron (ZOFRAN) injection 4 mg  4 mg Intravenous Q6H PRN Candelaria Stagers T, MD   4 mg at 04/02/23 1957   potassium chloride SA (KLOR-CON M) CR tablet 40 mEq  40 mEq Oral BID Leeroy Bock, MD   40 mEq at 04/03/23 2054    Allergies as of 04/02/2023   (No Known Allergies)    Family History  Problem Relation Age of Onset   CAD Neg Hx    CVA Neg Hx    Diabetes Neg Hx     Social History   Socioeconomic History   Marital status: Married    Spouse name: Not on file   Number of children: 4   Years of education: Not on file   Highest education  level: GED or equivalent  Occupational History   Not on file  Tobacco Use   Smoking status: Every Day    Types: E-cigarettes    Passive exposure: Never   Smokeless tobacco: Never  Vaping Use   Vaping status: Every Day   Substances: Nicotine, Flavoring  Substance and Sexual Activity   Alcohol use: Not Currently   Drug use: Yes    Types: Marijuana    Comment: once in a while. Last time was 3 weeks ago   Sexual activity: Yes  Other Topics Concern   Not on file  Social History Narrative   Fulltime: [employer]      Hobbies: like to play  guitars   Social Determinants of Health   Financial Resource Strain: Medium Risk (03/24/2023)   Overall Financial Resource Strain (CARDIA)    Difficulty of Paying Living Expenses: Somewhat hard  Food Insecurity: No Food Insecurity (04/02/2023)   Hunger Vital Sign    Worried About Running Out of Food in the Last Year: Never true    Ran Out of Food in the Last Year: Never true  Recent Concern: Food Insecurity - Food Insecurity Present (03/24/2023)   Hunger Vital Sign    Worried About Running Out of Food in the Last Year: Sometimes true    Ran Out of Food in the Last Year: Sometimes true  Transportation Needs: No Transportation Needs (04/02/2023)   PRAPARE - Administrator, Civil Service (Medical): No    Lack of Transportation (Non-Medical): No  Physical Activity: Not on file  Stress: Stress Concern Present (03/24/2023)   [employer] of Occupational Health - Occupational Stress Questionnaire    Feeling of Stress : Very much  Social Connections: Unknown (03/24/2023)   Social Connection and Isolation Panel [NHANES]    Frequency of Communication with Friends and Family: Not on file    Frequency of Social Gatherings with Friends and Family: Never    Attends Religious Services: Never    Database administrator or Organizations: No    Attends Engineer, structural: Not on file    Marital Status: Married  Catering manager Violence:  Not At Risk (04/02/2023)   Humiliation, Afraid, Rape, and Kick questionnaire    Fear of Current or Ex-Partner: No    Emotionally Abused: No    Physically Abused: No    Sexually Abused: No    Review of Systems: Pertinent positive and negative review of systems were noted in the above HPI section.  All other review of systems was otherwise negative.   Physical Exam: Vital signs in last 24 hours: Temp:  [97.6 F (36.4 C)-98.2 F (36.8 C)] 97.6 F (36.4 C) (09/20 0731) Pulse Rate:  [54-76] 65 (09/20 0731) Resp:  [17-18] 17 (09/20 0731) BP: (124-143)/(82-95) 124/82 (09/20 0731) SpO2:  [98 %-100 %] 100 % (09/20 0731) Last BM Date : 04/02/23 General:   Alert,  Well-developed, well-nourished, older male pleasant and cooperative in NAD Head:  Normocephalic and atraumatic. Eyes:  Sclera icteric   conjunctiva pink. Ears:  Normal auditory acuity. Nose:  No deformity, discharge,  or lesions. Mouth:  No deformity or lesions.   Neck:  Supple; no masses or thyromegaly. Lungs:  Clear throughout to auscultation.   No wheezes, crackles, or rhonchi.  Heart:  Regular rate and rhythm; no murmurs, clicks, rubs,  or gallops. Abdomen:  Soft,nontender, BS active, no palpable mass, liver palpable at the right costal margin Rectal:  Deferred -documented heme-negative Msk:  Symmetrical without gross deformities. . Pulses:  Normal pulses noted. Extremities:  Without clubbing or edema. Neurologic:  Alert and  oriented x4;  grossly normal neurologically. Skin:  Intact without significant lesions or rashes.. Psych:  Alert and cooperative. Normal mood and affect.  Intake/Output from previous day: 09/19 0701 - 09/20 0700 In: 678.6 [I.V.:678.6] Out: 600 [Urine:600] Intake/Output this shift: No intake/output data recorded.  Lab Results: Recent Labs    04/02/23 1057 04/03/23 1412 04/04/23 0557  WBC 5.2 2.9* 2.8*  HGB 12.3* 10.4* 9.8*  HCT 34.9* 28.7* 27.2*  PLT 117* 62* 54*   BMET Recent Labs     04/03/23 1412 04/03/23 1413 04/04/23 0557  NA 131*  132* 131*  K 3.2* 3.2* 3.5  CL 98 98 101  CO2 22 24 21*  GLUCOSE 118* 117* 91  BUN 7 7 5*  CREATININE 2.08* 2.02* 1.92*  CALCIUM 7.9* 8.2* 7.8*   LFT Recent Labs    04/02/23 1321 04/03/23 1412 04/04/23 0557  PROT 6.5   < > 5.1*  ALBUMIN 2.3*   < > 1.8*  AST 408*   < > 302*  ALT 202*   < > 150*  ALKPHOS 577*   < > 532*  BILITOT 3.8*   < > 4.6*  BILIDIR 1.8*  --   --   IBILI 2.0*  --   --    < > = values in this interval not displayed.   PT/INR No results for input(s): "LABPROT", "INR" in the last 72 hours. Hepatitis Panel Recent Labs    04/03/23 1412  HEPBSAG NON REACTIVE  HCVAB NON REACTIVE  HEPAIGM NON REACTIVE  HEPBIGM NON REACTIVE     IMPRESSION:  #20 57 year old white male with coronary artery disease status post PCI and stent February 2024 Admitted through the emergency room 2 days ago with complaints of intermittent dizziness, lightheadedness, shortness of breath and frequent falls at home secondary to syncope.  Redated symptoms ongoing for 5 to 6 months.  CT of the head negative. Was found to have evidence of acute kidney injury, and elevated LFTs, mild anemia and thrombocytopenia  Abdominal ultrasound shows a distended gallbladder no gallstones no ductal dilation but significant fatty infiltration of the liver noted without definite cirrhosis HIDA scan today concerning for underlying hepatocellular disease but no evidence of biliary disease  , He has not had any improvement in LFTs since admission, has had drift in hemoglobin and progressive thrombocytopenia  The pattern of LFT elevation is very consistent with that usually seen with alcoholic hepatitis and with thrombocytopenia is definite concern for component of underlying cirrhosis  He did  have alcohol in his system on admission but has completely denied any current EtOH use. He does admit to previous history of very heavy EtOH over many years but  apparently stopped 16 to 17 years ago  This afternoon patient is adamant about being discharged  Plan; He was advised that it appears he has significant underlying liver disease and that he definitely needs GI follow-up for further evaluation.  He is agreeable to outpatient follow-up Will arrange for outpatient labs in 2 weeks and then office follow-up with Dr. Tomasa Rand or myself Agree with discontinuing Lipitor which is known to cause LFT elevation  Anti  hypertensives are being discontinued, as his Plavix which is being switched to [employer] PA-C 04/04/2023, 9:59 AM   I have taken a history, reviewed the chart and examined the patient. I performed a substantive portion of this encounter, including complete performance of at least one of the key components, in conjunction with the APP. I agree with the APP's note, impression and recommendations  57 year old male with history of CAD admitted with recurrent symptoms of light-headedness/dizziness and syncope.  He was found to have elevated liver enzymes in a mixed pattern.  Given AST>ALT and hyperbilirubinemia, high concern for alcoholic hepatitis. No coagulopathy or encephalopathy. The patient admits to history of alcohol abuse but states that he has not drank in over 16 years despite having detectable alcohol in his blood during admission. I encourage the patient to stay, to make sure his labs were improving and to perform further evaluation of liver disease,  but the patient was adamant about leaving today.  As there was no absolute indication to keep him in the hospital, close outpatient follow up is reasonable. Plan to repeat CMP and CBC in 2 weeks and will schedule outpatient follow up visit.  Patient advised to completely abstain from alcohol   Scott E. Tomasa Rand, MD [facility]

## 2023-04-04 NOTE — Discharge Summary (Signed)
Physician Discharge Summary  Patient: Jeffrey Hodges ZOX:[id number] DOB: May 25, 1966   Code Status: Full Code Admit date: 04/02/2023 Discharge date: 04/04/2023 Disposition: Home, No home health services recommended PCP: Eden Emms, NP  Recommendations for Outpatient Follow-up:  Follow up with PCP within 1-2 weeks Regarding general hospital follow up and preventative care Recommend monitoring blood pressure off of medications Recommend monitoring electrolytes and metabolic panel Follow up with GI  Discharge Diagnoses:  Principal Problem:   Syncope and collapse Active Problems:   Primary hypertension   Anxiety and depression   History of CAD (coronary artery disease)   AKI (acute kidney injury) (HCC)   Thrombocytopenia (HCC)   Anemia   Transaminitis  Brief Hospital Course Summary: Jeffrey Hodges is a 57 y.o. male with PMH of CAD s/p PCI and stent in 08/2022, HTN, anxiety and depression.   Patient presented with dizziness, dyspnea with exertion, frequent falls and syncope over the last 5 to 6 months.  No acute change. Denies prodrome such as palpitation.  Denies chest pain.  Denies orthopnea, PND or leg edema. He denies drinking alcohol although has remote history of alcohol dependence.  He denies smoking cigarette but admits to vaping.  He denies recreational drug use.    In ED, stable vitals although soft blood pressures. Na 132. Cr 2.5 (0.95 in 08/2022).  BUN 13.  CO2 18.  Hgb 12.3.  Platelets 117.  Hemoccult negative.  AST 408, ALT 202, alk phos 577, total bilirubin 3.8. EKG sinus tachycardia to 107 with nonspecific ST and T wave changes.  CT head without acute finding but question about dislocated left lens.    RUQ [place]: Gallbladder dilatation with free wall thickness slightly prominent measuring 3.5 mm. There are no stones or positive sonographic Murphy's sign. 2. Findings could be due to chronic versus pain-medicated acalculous cholecystitis, reactive wall thickening from an  adjacent inflammatory process, or hepatic dysfunction. 3. Diffuse fatty replacement of the liver with hyperechoic parenchyma.  Hospital service called for admission for recurrent syncope and AKI. Received IV fluid bolus in ED.   Syncope- workup overall unremarkable with the exception of hypotension. His multiple home antihypertensives were held and then his blood pressure remained on the low-normal range so it is likely that he was overmedicated and causing his syncopal symptoms. They were stopped at discharge.  He had no further episodes of syncope.  AKI-remained stable Cr 2.02>1.92 on day of discharge.  Patient left prior to completing recommended treatments.  Incidental finding upon admission was abnormal liver enzymes.  No GI symptoms.  No RUQ tenderness.  Improving overall.  AST 408>340, ALT 202>172, alk phos 577>519. However, bilirubin increased 3.8> 4.5. hepatitis panel pending. CK 483, ethanol 13. Platelets 117>62 RUQ US revealed: 1. Gallbladder dilatation with free wall thickness slightly prominent measuring 3.5 mm. There are no stones or positive sonographic Murphy's sign. 2. Findings could be due to chronic versus pain-medicated acalculous cholecystitis, reactive wall thickening from an adjacent inflammatory process, or hepatic dysfunction. 3. Diffuse fatty replacement of the liver with hyperechoic parenchyma. His statin was stopped.  GI was consulted and ordered a HIDA scan.  Results of the HIDA scan were pending at the time of discharge.  Patient left prior to recommended workup however, GI was able to discuss his care with him and will arrange for outpatient follow-up. Patient was hemodynamically stable and asymptomatic at the time of discharge.  Discharge Condition: Stable, improved Recommended discharge diet: Regular healthy diet  Consultations: GI  Procedures/Studies: HIDA  Discharge Instructions     Discharge patient   Complete by: As directed    Discharge  disposition: 01-Home or Self Care   Discharge patient date: 04/03/2023   Discharge patient   Complete by: As directed    Discharge disposition: 01-Home or Self Care   Discharge patient date: 04/04/2023      Allergies as of 04/04/2023   No Known Allergies      Medication List     STOP taking these medications    amLODipine 2.5 MG tablet Commonly known as: NORVASC   clopidogrel 75 MG tablet Commonly known as: PLAVIX   LORazepam 0.5 MG tablet Commonly known as: ATIVAN   losartan 25 MG tablet Commonly known as: COZAAR       TAKE these medications    aspirin EC 81 MG tablet Take 1 tablet (81 mg total) by mouth daily. Swallow whole.   atorvastatin 80 MG tablet Commonly known as: LIPITOR Take 1 tablet (80 mg total) by mouth daily.   Brilinta 90 MG Tabs tablet Generic drug: ticagrelor Take 90 mg by mouth 2 (two) times daily.   busPIRone 10 MG tablet Commonly known as: BUSPAR TAKE 1 TABLET BY MOUTH TWICE A DAY   FLUoxetine 40 MG capsule Commonly known as: PROZAC TAKE 1 CAPSULE (40 MG TOTAL) BY MOUTH DAILY.   nitroGLYCERIN 0.4 MG SL tablet Commonly known as: NITROSTAT Place 1 tablet (0.4 mg total) under the tongue every 5 (five) minutes x 3 doses as needed for chest pain.   ondansetron 4 MG disintegrating tablet Commonly known as: ZOFRAN-ODT Take 1 tablet (4 mg total) by mouth every 8 (eight) hours as needed for nausea or vomiting.        Follow-up Information     Eden Emms, NP. Schedule an appointment as soon as possible for a visit in 1 week(s).   Specialties: Nurse Practitioner, Family Medicine Contact information: [address] [place] [address] [phone]         Jenel Lucks, MD. Schedule an appointment as soon as possible for a visit.   Specialty: Gastroenterology Why: as soon as possible Contact information: [address] [place] [address] [phone]                 Subjective   Pt reports no  complaints.  Denies nausea, vomiting, abdominal pain, hematochezia.  Denies any more episodes of syncope and has no orthostatic symptoms with posture changes.  All questions and concerns were addressed at time of discharge.  Objective  Blood pressure 124/82, pulse 65, temperature 97.6 F (36.4 C), temperature source Oral, resp. rate 17, height 6\' 1"  (1.854 m), weight 95.3 kg, SpO2 100%.   General: Pt is alert, awake, not in acute distress Cardiovascular: RRR, S1/S2 +, no rubs, no gallops Respiratory: CTA bilaterally, no wheezing, no rhonchi Abdominal: Soft, NT, ND, bowel sounds + Extremities: no edema, no cyanosis  The results of significant diagnostics from this hospitalization (including imaging, microbiology, ancillary and laboratory) are listed below for reference.   Imaging studies: NM Hepatobiliary Liver Func  Result Date: 04/04/2023 CLINICAL DATA:  Elevated liver enzymes. EXAM: NUCLEAR MEDICINE HEPATOBILIARY IMAGING TECHNIQUE: Sequential images of the abdomen were obtained out to 60 minutes following intravenous administration of radiopharmaceutical. RADIOPHARMACEUTICALS:  7.6 mCi Tc-52m  Choletec IV COMPARISON:  Ultrasound 04/02/2019 FINDINGS: Slight delayed clearance of the blood pool. Homogeneous distribution of radiotracer along the liver. Gallbladder activity is visualized, consistent with patency of  cystic duct. Biliary activity passes into small bowel, consistent with patent common bile duct. IMPRESSION: No common duct or cystic duct obstruction. Slight delayed clearance of the radiotracer from the blood pool. Please correlate with the hepatocellular function Electronically Signed   By: Karen Kays M.D.   On: 04/04/2023 12:51   ECHOCARDIOGRAM COMPLETE  Result Date: 04/03/2023    ECHOCARDIOGRAM REPORT   Patient Name:   Bennie Pierini Date of Exam: 04/03/2023 Medical Rec #:  [id number]     Height:       73.0 in Accession #:    [id number]    Weight:       210.0 lb Date of Birth:   09/26/1965    BSA:          2.197 m Patient Age:    56 years      BP:           136/90 mmHg Patient Gender: M             HR:           62 bpm. Exam Location:  Inpatient Procedure: 2D Echo, Cardiac Doppler and Color Doppler Indications:    Other abnormalities of the heart R008.8  History:        Patient has prior history of Echocardiogram examinations, most                 recent 09/12/2022. Previous Myocardial Infarction and CAD,                 Signs/Symptoms:Syncope; Risk Factors:Hypertension, Dyslipidemia                 and Current Smoker.  Sonographer:    Lucendia Herrlich Referring Phys: [id number] TAYE T GONFA IMPRESSIONS  1. Left ventricular ejection fraction, by estimation, is 55 to 60%. The left ventricle has normal function. The left ventricle has no regional wall motion abnormalities. Left ventricular diastolic parameters were normal.  2. Right ventricular systolic function is normal. The right ventricular size is mildly enlarged.  3. The mitral valve is normal in structure. Trivial mitral valve regurgitation. No evidence of mitral stenosis.  4. The aortic valve is tricuspid. There is mild calcification of the aortic valve. There is mild thickening of the aortic valve. Aortic valve regurgitation is not visualized.  5. The inferior vena cava is dilated in size with >50% respiratory variability, suggesting right atrial pressure of 8 mmHg. Comparison(s): Prior images reviewed side by side. IVC has dilated since last study. FINDINGS  Left Ventricle: Left ventricular ejection fraction, by estimation, is 55 to 60%. The left ventricle has normal function. The left ventricle has no regional wall motion abnormalities. The left ventricular internal cavity size was normal in size. There is  no left ventricular hypertrophy. Left ventricular diastolic parameters were normal. Right Ventricle: The right ventricular size is mildly enlarged. No increase in right ventricular wall thickness. Right ventricular systolic  function is normal. Left Atrium: Left atrial size was normal in size. Right Atrium: Right atrial size was normal in size. Pericardium: There is no evidence of pericardial effusion. Mitral Valve: The mitral valve is normal in structure. Trivial mitral valve regurgitation. No evidence of mitral valve stenosis. Tricuspid Valve: The tricuspid valve is normal in structure. Tricuspid valve regurgitation is not demonstrated. No evidence of tricuspid stenosis. Aortic Valve: The aortic valve is tricuspid. There is mild calcification of the aortic valve. There is mild thickening of the aortic valve. Aortic valve regurgitation is not  visualized. Aortic valve peak gradient measures 9.0 mmHg. Pulmonic Valve: The pulmonic valve was normal in structure. Pulmonic valve regurgitation is not visualized. No evidence of pulmonic stenosis. Aorta: The aortic root and ascending aorta are structurally normal, with no evidence of dilitation. Venous: The inferior vena cava is dilated in size with greater than 50% respiratory variability, suggesting right atrial pressure of 8 mmHg. IAS/Shunts: The atrial septum is grossly normal.  LEFT VENTRICLE PLAX 2D LVIDd:         5.20 cm   Diastology LVIDs:         3.70 cm   LV e' medial:    8.24 cm/s LV PW:         0.90 cm   LV E/e' medial:  9.6 LV IVS:        0.80 cm   LV e' lateral:   10.40 cm/s LVOT diam:     2.20 cm   LV E/e' lateral: 7.6 LV SV:         73 LV SV Index:   33 LVOT Area:     3.80 cm  RIGHT VENTRICLE             IVC RV S prime:     15.10 cm/s  IVC diam: 2.40 cm TAPSE (M-mode): 2.0 cm LEFT ATRIUM           Index        RIGHT ATRIUM           Index LA diam:      3.50 cm 1.59 cm/m   RA Area:     14.00 cm LA Vol (A2C): 39.7 ml 18.07 ml/m  RA Volume:   34.90 ml  15.89 ml/m LA Vol (A4C): 43.7 ml 19.89 ml/m  AORTIC VALVE AV Area (Vmax): 2.42 cm AV Vmax:        150.00 cm/s AV Peak Grad:   9.0 mmHg LVOT Vmax:      95.60 cm/s LVOT Vmean:     61.100 cm/s LVOT VTI:       0.191 m  AORTA Ao  Root diam: 3.70 cm Ao Asc diam:  3.50 cm MITRAL VALVE               TRICUSPID VALVE MV Area (PHT): 3.53 cm    TR Peak grad:   13.5 mmHg MV Decel Time: 215 msec    TR Vmax:        184.00 cm/s MR Peak grad: 29.8 mmHg MR Vmax:      273.00 cm/s  SHUNTS MV E velocity: 79.10 cm/s  Systemic VTI:  0.19 m MV A velocity: 74.70 cm/s  Systemic Diam: 2.20 cm MV E/A ratio:  1.06 Riley Lam MD Electronically signed by Riley Lam MD Signature Date/Time: 04/03/2023/12:11:06 PM    Final    US Abdomen Limited RUQ (LIVER/GB)  Result Date: 04/03/2023 CLINICAL DATA:  [id number] with elevated liver enzymes. EXAM: ULTRASOUND ABDOMEN LIMITED RIGHT UPPER QUADRANT COMPARISON:  None Available. FINDINGS: Gallbladder: There is dilatation of the gallbladder up to 12 cm in length. The free wall thickness slightly prominent measuring 3.5 mm. There are no stones or positive sonographic Murphy's sign. Common bile duct: Diameter: 4.7 mm, within normal limits with no intrahepatic biliary prominence. Liver: No focal lesion identified. The liver is diffusely attenuating consistent with generalized fatty replacement, likely mild-to-moderate in degree. Portal vein is patent on color Doppler imaging with normal direction of blood flow towards the liver. Other: No right upper quadrant ascites. IMPRESSION:  1. Gallbladder dilatation with free wall thickness slightly prominent measuring 3.5 mm. There are no stones or positive sonographic Murphy's sign. 2. Findings could be due to chronic versus pain-medicated acalculous cholecystitis, reactive wall thickening from an adjacent inflammatory process, or hepatic dysfunction. 3. Diffuse fatty replacement of the liver with hyperechoic parenchyma. Electronically Signed   By: Almira Bar M.D.   On: 04/03/2023 00:20   US Renal  Result Date: 04/02/2023 CLINICAL DATA:  elevated creatnine, r/o obstructive uropathy EXAM: RENAL / URINARY TRACT ULTRASOUND COMPLETE COMPARISON:  None Available.  FINDINGS: Right Kidney: Renal measurements: 10.2 x 5.8 x 5.8 cm = volume: 178 mL. Echogenicity within normal limits. No mass or hydronephrosis visualized. Left Kidney: Renal measurements: 10.5 x 5.8 x 5.5 cm = volume: 175 mL. Echogenicity within normal limits. No mass or hydronephrosis visualized. Bladder: Appears normal for degree of bladder distention. Other: None. IMPRESSION: No hydronephrosis. Electronically Signed   By: Meda Klinefelter M.D.   On: 04/02/2023 16:10   CT Head Wo Contrast  Result Date: 04/02/2023 CLINICAL DATA:  Mental status change, unknown cause; Neck trauma, midline tenderness (Age 31-64y) EXAM: CT HEAD WITHOUT CONTRAST CT CERVICAL SPINE WITHOUT CONTRAST TECHNIQUE: Multidetector CT imaging of the head and cervical spine was performed following the standard protocol without intravenous contrast. Multiplanar CT image reconstructions of the cervical spine were also generated. RADIATION DOSE REDUCTION: This exam was performed according to the departmental dose-optimization program which includes automated exposure control, adjustment of the mA and/or kV according to patient size and/or use of iterative reconstruction technique. COMPARISON:  CT Head 09/14/06 FINDINGS: CT HEAD FINDINGS Brain: No hemorrhage. No hydrocephalus. No extra-axial fluid collection. No CT evidence of an acute cortical infarct. No mass effect. No mass lesion. Vascular: No hyperdense vessel or unexpected calcification. Skull: Normal. Negative for fracture or focal lesion. Sinuses/Orbits: No middle ear or mastoid effusion. Paranasal sinuses are clear. The left lens is posteriorly dislocated. Left-sided scleral band in place. Other: None. CT CERVICAL SPINE FINDINGS Alignment: Grade 1 anterolisthesis of C4 on C5. Trace retrolisthesis of C5 on C6. Skull base and vertebrae: No acute fracture. No primary bone lesion or focal pathologic process. Soft tissues and spinal canal: No prevertebral fluid or swelling. No visible canal  hematoma. Disc levels:  No evidence of high-grade spinal canal stenosis. Upper chest: Negative. Other: None IMPRESSION: 1. No acute intracranial abnormality. 2. No acute fracture or traumatic malalignment of the cervical spine. 3. 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 Correlate with visual exam and consider opthalmologic consultation. Electronically Signed   By: Lorenza Cambridge M.D.   On: 04/02/2023 14:50   CT Cervical Spine Wo Contrast  Result Date: 04/02/2023 CLINICAL DATA:  Mental status change, unknown cause; Neck trauma, midline tenderness (Age 31-64y) EXAM: CT HEAD WITHOUT CONTRAST CT CERVICAL SPINE WITHOUT CONTRAST TECHNIQUE: Multidetector CT imaging of the head and cervical spine was performed following the standard protocol without intravenous contrast. Multiplanar CT image reconstructions of the cervical spine were also generated. RADIATION DOSE REDUCTION: This exam was performed according to the departmental dose-optimization program which includes automated exposure control, adjustment of the mA and/or kV according to patient size and/or use of iterative reconstruction technique. COMPARISON:  CT Head 09/14/06 FINDINGS: CT HEAD FINDINGS Brain: No hemorrhage. No hydrocephalus. No extra-axial fluid collection. No CT evidence of an acute cortical infarct. No mass effect. No mass lesion. Vascular: No hyperdense vessel or unexpected calcification. Skull: Normal. Negative for fracture or focal lesion. Sinuses/Orbits: No middle ear or  mastoid effusion. Paranasal sinuses are clear. The left lens is posteriorly dislocated. Left-sided scleral band in place. Other: None. CT CERVICAL SPINE FINDINGS Alignment: Grade 1 anterolisthesis of C4 on C5. Trace retrolisthesis of C5 on C6. Skull base and vertebrae: No acute fracture. No primary bone lesion or focal pathologic process. Soft tissues and spinal canal: No prevertebral fluid or swelling. No visible canal hematoma. Disc levels:  No evidence of  high-grade spinal canal stenosis. Upper chest: Negative. Other: None IMPRESSION: 1. No acute intracranial abnormality. 2. No acute fracture or traumatic malalignment of the cervical spine. 3. 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 Correlate with visual exam and consider opthalmologic consultation. Electronically Signed   By: Lorenza Cambridge M.D.   On: 04/02/2023 14:50   DG Chest Port 1 View  Result Date: 04/02/2023 CLINICAL DATA:  weakness EXAM: PORTABLE CHEST 1 VIEW COMPARISON:  Same day chest radiograph, September 07, 2022. FINDINGS: The cardiomediastinal silhouette is unchanged in contour. No pleural effusion. No pneumothorax. No acute pleuroparenchymal abnormality. IMPRESSION: No acute cardiopulmonary abnormality. Electronically Signed   By: Meda Klinefelter M.D.   On: 04/02/2023 13:56   DG Chest 2 View  Result Date: 04/02/2023 CLINICAL DATA:  Shortness of breath.  Dizziness. EXAM: CHEST - 2 VIEW COMPARISON:  09/10/2022. FINDINGS: Bilateral lung fields are clear. Bilateral costophrenic angles are clear. Normal cardio-mediastinal silhouette. No acute osseous abnormalities. The soft tissues are within normal limits. IMPRESSION: No active cardiopulmonary disease. Electronically Signed   By: Jules Schick M.D.   On: 04/02/2023 12:09    Labs: Basic Metabolic Panel: Recent Labs  Lab 04/02/23 1057 04/02/23 1852 04/03/23 1412 04/03/23 1413 04/04/23 0557  NA 132*  --  131* 132* 131*  K 3.9  --  3.2* 3.2* 3.5  CL 97*  --  98 98 101  CO2 18*  --  22 24 21*  GLUCOSE 120*  --  118* 117* 91  BUN 13  --  7 7 5*  CREATININE 2.50* 2.25* 2.08* 2.02* 1.92*  CALCIUM 8.3*  --  7.9* 8.2* 7.8*  PHOS  --   --  2.2* 2.1*  --    CBC: Recent Labs  Lab 04/02/23 1057 04/03/23 1412 04/04/23 0557  WBC 5.2 2.9* 2.8*  HGB 12.3* 10.4* 9.8*  HCT 34.9* 28.7* 27.2*  MCV 98.9 95.3 96.8  PLT 117* 62* 54*   Microbiology: No results found for this or any previous visit.   Time coordinating  discharge: Over 30 minutes  Leeroy Bock, MD  [facility] 04/04/2023, 1:53 PM

## 2023-04-07 ENCOUNTER — Telehealth: Payer: Self-pay

## 2023-04-07 NOTE — Transitions of Care (Post Inpatient/ED Visit) (Unsigned)
04/07/2023  Name: Jeffrey Hodges MRN: [id number] DOB: 1966/07/05  Today's TOC FU Call Status: Today's TOC FU Call Status:: Unsuccessful Call (1st Attempt) Unsuccessful Call (1st Attempt) Date: 04/07/23  Attempted to reach the patient regarding the most recent Inpatient/ED visit.  Follow Up Plan: Additional outreach attempts will be made to reach the patient to complete the Transitions of Care (Post Inpatient/ED visit) call.   Signature Karena Addison, LPN [facility] Nurse Health Advisor Direct Dial [phone]

## 2023-04-07 NOTE — Telephone Encounter (Signed)
Called the spouse back. Per her request, I left information on her voicemail. It is okay to come for labs on 04/14/23.

## 2023-04-07 NOTE — Telephone Encounter (Signed)
Amy or Dr Tomasa Rand The patient's spouse called today. Health insurance for the patient will end on 04/14/23. New insurance will become active after 04/30/23. Can he have the labs done on 04/14/23? Or do you want to wait until after 04/30/23? Spouse # [phone]

## 2023-04-08 NOTE — Transitions of Care (Post Inpatient/ED Visit) (Unsigned)
04/08/2023  Name: Jeffrey Hodges MRN: [id number] DOB: Apr 22, 1966  Today's TOC FU Call Status: Today's TOC FU Call Status:: Unsuccessful Call (2nd Attempt) Unsuccessful Call (1st Attempt) Date: 04/07/23 Unsuccessful Call (2nd Attempt) Date: 04/08/23  Attempted to reach the patient regarding the most recent Inpatient/ED visit.  Follow Up Plan: Additional outreach attempts will be made to reach the patient to complete the Transitions of Care (Post Inpatient/ED visit) call.   Signature Karena Addison, LPN [facility] Nurse Health Advisor Direct Dial [phone]

## 2023-04-09 NOTE — Transitions of Care (Post Inpatient/ED Visit) (Signed)
04/09/2023  Name: Jeffrey Hodges MRN: [id number] DOB: [birth year]  Today's TOC FU Call Status: Today's TOC FU Call Status:: Unsuccessful Call (3rd Attempt) Unsuccessful Call (1st Attempt) Date: 04/07/23 Unsuccessful Call (2nd Attempt) Date: 04/08/23 Unsuccessful Call (3rd Attempt) Date: 04/09/23  Attempted to reach the patient regarding the most recent Inpatient/ED visit.  Follow Up Plan: No further outreach attempts will be made at this time. We have been unable to contact the patient.  Signature Karena Addison, LPN [facility] Nurse Health Advisor Direct Dial [phone]

## 2023-04-14 ENCOUNTER — Encounter: Payer: Self-pay | Admitting: Nurse Practitioner

## 2023-04-14 ENCOUNTER — Ambulatory Visit: Payer: BC Managed Care – PPO | Admitting: Nurse Practitioner

## 2023-04-14 ENCOUNTER — Other Ambulatory Visit (INDEPENDENT_AMBULATORY_CARE_PROVIDER_SITE_OTHER): Payer: BC Managed Care – PPO

## 2023-04-14 VITALS — BP 140/88 | HR 62 | Temp 98.1°F | Ht 73.0 in | Wt 198.0 lb

## 2023-04-14 DIAGNOSIS — I214 Non-ST elevation (NSTEMI) myocardial infarction: Secondary | ICD-10-CM | POA: Diagnosis not present

## 2023-04-14 DIAGNOSIS — N179 Acute kidney failure, unspecified: Secondary | ICD-10-CM | POA: Diagnosis not present

## 2023-04-14 DIAGNOSIS — I10 Essential (primary) hypertension: Secondary | ICD-10-CM | POA: Diagnosis not present

## 2023-04-14 DIAGNOSIS — D696 Thrombocytopenia, unspecified: Secondary | ICD-10-CM

## 2023-04-14 DIAGNOSIS — D509 Iron deficiency anemia, unspecified: Secondary | ICD-10-CM | POA: Diagnosis not present

## 2023-04-14 DIAGNOSIS — R7989 Other specified abnormal findings of blood chemistry: Secondary | ICD-10-CM | POA: Insufficient documentation

## 2023-04-14 DIAGNOSIS — F32A Depression, unspecified: Secondary | ICD-10-CM

## 2023-04-14 DIAGNOSIS — Z09 Encounter for follow-up examination after completed treatment for conditions other than malignant neoplasm: Secondary | ICD-10-CM | POA: Insufficient documentation

## 2023-04-14 DIAGNOSIS — F419 Anxiety disorder, unspecified: Secondary | ICD-10-CM

## 2023-04-14 LAB — COMPREHENSIVE METABOLIC PANEL
ALT: 50 U/L (ref 0–53)
AST: 58 U/L — ABNORMAL HIGH (ref 0–37)
Albumin: 2.7 g/dL — ABNORMAL LOW (ref 3.5–5.2)
Alkaline Phosphatase: 221 U/L — ABNORMAL HIGH (ref 39–117)
BUN: 11 mg/dL (ref 6–23)
CO2: 26 meq/L (ref 19–32)
Calcium: 8.2 mg/dL — ABNORMAL LOW (ref 8.4–10.5)
Chloride: 100 meq/L (ref 96–112)
Creatinine, Ser: 1.13 mg/dL (ref 0.40–1.50)
GFR: 73.53 mL/min (ref >60.00)
Glucose, Bld: 96 mg/dL (ref 70–99)
Potassium: 3.9 meq/L (ref 3.5–5.1)
Sodium: 135 meq/L (ref 135–145)
Total Bilirubin: 2.1 mg/dL — ABNORMAL HIGH (ref 0.2–1.2)
Total Protein: 6.3 g/dL (ref 6.0–8.3)

## 2023-04-14 LAB — CBC WITH DIFFERENTIAL/PLATELET
Basophils Absolute: 0 10*3/uL (ref 0.0–0.1)
Basophils Relative: 0.7 % (ref 0.0–3.0)
Eosinophils Absolute: 0.1 10*3/uL (ref 0.0–0.7)
Eosinophils Relative: 1.1 % (ref 0.0–5.0)
HCT: 33.6 % — ABNORMAL LOW (ref 39.0–52.0)
Hemoglobin: 11.1 g/dL — ABNORMAL LOW (ref 13.0–17.0)
Lymphocytes Relative: 14.2 % (ref 12.0–46.0)
Lymphs Abs: 0.8 10*3/uL (ref 0.7–4.0)
MCHC: 33.1 g/dL (ref 30.0–36.0)
MCV: 108.1 fL — ABNORMAL HIGH (ref 78.0–100.0)
Monocytes Absolute: 0.3 10*3/uL (ref 0.1–1.0)
Monocytes Relative: 5.3 % (ref 3.0–12.0)
Neutro Abs: 4.5 10*3/uL (ref 1.4–7.7)
Neutrophils Relative %: 78.7 % — ABNORMAL HIGH (ref 43.0–77.0)
Platelets: 185 10*3/uL (ref 150.0–400.0)
RBC: 3.11 Mil/uL — ABNORMAL LOW (ref 4.22–5.81)
RDW: 20.1 % — ABNORMAL HIGH (ref 11.5–15.5)
WBC: 5.7 10*3/uL (ref 4.0–10.5)

## 2023-04-14 LAB — PROTIME-INR
INR: 1.1 {ratio} — ABNORMAL HIGH (ref 0.8–1.0)
Prothrombin Time: 11.4 s (ref 9.6–13.1)

## 2023-04-14 NOTE — Assessment & Plan Note (Signed)
Patient had blood work drawn today for a different provider AKI has resolved

## 2023-04-14 NOTE — Assessment & Plan Note (Signed)
Did review ED and inpatient note.  Reviewed imaging and lab work.

## 2023-04-14 NOTE — Assessment & Plan Note (Signed)
History of the same was placed on beta-blocker and ARB.  Patient still on Brilinta and aspirin post stenting

## 2023-04-14 NOTE — Assessment & Plan Note (Signed)
Patient was on losartan and metoprolol.  This was discontinued in the setting of syncope and AKI.  AKI has resolved blood pressure slightly above normal limits.  Patient states he still getting low blood pressures at home we will hold off on starting antihypertensive medications he is to check his blood pressure 2-3 times a week and let me know the readings.  Also blood pressure cuff to next office visit to compare

## 2023-04-14 NOTE — Assessment & Plan Note (Signed)
Patient has history of same.  Per our [facility] he is on buspirone 10 mg twice daily and Prozac 40 mg daily has not been refilled in some time unsure if patient is on the medication.  He will reach back to the office and let me know if he has risen.  They discontinue lorazepam in the setting of elevated LFTs.

## 2023-04-14 NOTE — Assessment & Plan Note (Signed)
Elevated LFTs in the hospital.  Patient underwent abdominal ultrasound and HIDA scan.  Patient had repeat blood work done today liver functions are improving but not back to baseline yet.  Hepatitis panel was negative

## 2023-04-14 NOTE — Progress Notes (Signed)
Established Patient Office Visit  Subjective   Patient ID: Jeffrey Hodges, male    DOB: February 01, 1966  Age: 57 y.o. MRN: [id number]  Chief Complaint  Patient presents with   Hospitalization Follow-up    Pt complains of feeling tired and weaker than normal.     HPI   Hospital follow up: Patient was seen in the emergency department on 04/02/2023 for dizziness and weakness.  For over the last 6 months patient report.  He had a syncopal fall episode along with a fall the day prior to the ED.  Patient was admitted for AKI.  Patient does have a history of an NSTEMI which involved stenting.  He has been followed by cardiology last office visit was March 2024.Patient currently on atorvastatin, Brilinta, aspirin.  States that since the hosptial he has been feeling better with no lighteadedness or dizziness. States that he has been checking his blood pressure at home. Staets that he will get 85/--. States that he has been drinking a lot of water approx a gallon of water througout the day.      Review of Systems  Constitutional:  Negative for chills and fever.  Respiratory:  Negative for shortness of breath.   Cardiovascular:  Negative for chest pain.  Gastrointestinal:  Negative for abdominal pain, blood in stool, constipation, diarrhea, nausea and vomiting.       BM daily   Neurological:  Negative for dizziness and headaches.  Psychiatric/Behavioral:  Negative for hallucinations and suicidal ideas.       Objective:     BP (!) 140/88   Pulse 62   Temp 98.1 F (36.7 C) (Oral)   Ht 6\' 1"  (1.854 m)   Wt 198 lb (89.8 kg)   SpO2 98%   BMI 26.12 kg/m  BP Readings from Last 3 Encounters:  04/14/23 (!) 140/88  04/04/23 (!) 139/103  10/01/22 138/70   Wt Readings from Last 3 Encounters:  04/14/23 198 lb (89.8 kg)  04/02/23 210 lb (95.3 kg)  10/01/22 218 lb (98.9 kg)      Physical Exam Vitals and nursing note reviewed.  Constitutional:      Appearance: Normal appearance.  Eyes:      Pupils: Pupils are equal, round, and reactive to light.     Comments: Left pupil less reactive then right   Cardiovascular:     Rate and Rhythm: Normal rate and regular rhythm.     Heart sounds: Normal heart sounds.  Pulmonary:     Effort: Pulmonary effort is normal.     Breath sounds: Normal breath sounds.  Abdominal:     General: Bowel sounds are normal. There is no distension.     Palpations: There is no mass.     Tenderness: There is no abdominal tenderness.     Hernia: No hernia is present.  Lymphadenopathy:     Cervical: No cervical adenopathy.  Neurological:     General: No focal deficit present.     Mental Status: He is alert.     Deep Tendon Reflexes:     Reflex Scores:      Bicep reflexes are 2+ on the right side and 2+ on the left side.      Patellar reflexes are 2+ on the right side and 2+ on the left side.    Comments: Bilateral upper and lower extremity strength 5/5      Results for orders placed or performed in visit on 04/14/23  CBC with Differential/Platelet  Result  Value Ref Range   WBC 5.7 4.0 - 10.5 K/uL   RBC 3.11 (L) 4.22 - 5.81 Mil/uL   Hemoglobin 11.1 (L) 13.0 - 17.0 g/dL   HCT 41.3 (L) 24.4 - 01.0 %   MCV 108.1 (H) 78.0 - 100.0 fl   MCHC 33.1 30.0 - 36.0 g/dL   RDW 27.2 (H) 53.6 - 64.4 %   Platelets 185.0 150.0 - 400.0 K/uL   Neutrophils Relative % 78.7 (H) 43.0 - 77.0 %   Lymphocytes Relative 14.2 12.0 - 46.0 %   Monocytes Relative 5.3 3.0 - 12.0 %   Eosinophils Relative 1.1 0.0 - 5.0 %   Basophils Relative 0.7 0.0 - 3.0 %   Neutro Abs 4.5 1.4 - 7.7 K/uL   Lymphs Abs 0.8 0.7 - 4.0 K/uL   Monocytes Absolute 0.3 0.1 - 1.0 K/uL   Eosinophils Absolute 0.1 0.0 - 0.7 K/uL   Basophils Absolute 0.0 0.0 - 0.1 K/uL  Comprehensive metabolic panel  Result Value Ref Range   Sodium 135 135 - 145 mEq/L   Potassium 3.9 3.5 - 5.1 mEq/L   Chloride 100 96 - 112 mEq/L   CO2 26 19 - 32 mEq/L   Glucose, Bld 96 70 - 99 mg/dL   BUN 11 6 - 23 mg/dL    Creatinine, Ser 0.34 0.40 - 1.50 mg/dL   Total Bilirubin 2.1 (H) 0.2 - 1.2 mg/dL   Alkaline Phosphatase 221 (H) 39 - 117 U/L   AST 58 (H) 0 - 37 U/L   ALT 50 0 - 53 U/L   Total Protein 6.3 6.0 - 8.3 g/dL   Albumin 2.7 (L) 3.5 - 5.2 g/dL   GFR 74.25 >95.63 mL/min   Calcium 8.2 (L) 8.4 - 10.5 mg/dL  Protime-INR  Result Value Ref Range   INR 1.1 (H) 0.8 - 1.0 ratio   Prothrombin Time 11.4 9.6 - 13.1 sec      The ASCVD Risk score (Arnett DK, et al., 2019) failed to calculate for the following reasons:   The patient has a prior MI or stroke diagnosis    Assessment & Plan:   Problem List Items Addressed This Visit       Cardiovascular and Mediastinum   Primary hypertension - Primary    Patient was on losartan and metoprolol.  This was discontinued in the setting of syncope and AKI.  AKI has resolved blood pressure slightly above normal limits.  Patient states he still getting low blood pressures at home we will hold off on starting antihypertensive medications he is to check his blood pressure 2-3 times a week and let me know the readings.  Also blood pressure cuff to next office visit to compare      NSTEMI (non-ST elevated myocardial infarction) (HCC)    History of the same was placed on beta-blocker and ARB.  Patient still on Brilinta and aspirin post stenting        Genitourinary   AKI (acute kidney injury) (HCC)    Patient had blood work drawn today for a different provider AKI has resolved        Other   Anxiety and depression    Patient has history of same.  Per our [facility] he is on buspirone 10 mg twice daily and Prozac 40 mg daily has not been refilled in some time unsure if patient is on the medication.  He will reach back to the office and let me know if he has risen.  They discontinue  lorazepam in the setting of elevated LFTs.      Elevated LFTs    Elevated LFTs in the hospital.  Patient underwent abdominal ultrasound and HIDA scan.  Patient had repeat blood work  done today liver functions are improving but not back to baseline yet.  Hepatitis panel was negative      Hospital discharge follow-up    Did review ED and inpatient note.  Reviewed imaging and lab work.       Return in about 4 weeks (around 05/12/2023) for BP recheck, anxiety recheck .    Audria Nine, NP

## 2023-04-14 NOTE — Patient Instructions (Signed)
Was good to see you today Check your blood pressure 2-3 times a week at home I want to see you in 1 month  I have you are taking Buspar( buspirone) and Prozac (fluoxetine) for your anxiety. Let me know if you are still taking these medications

## 2023-04-17 LAB — CARBOHYDRATE DEFICIENT TRANSFERRIN
%CDT: 2.4 % (ref <2.5)
CDT: 35.8 mg/L (ref 28.1–76.0)
Transferrin: 150 mg/dL — ABNORMAL LOW (ref 188–341)

## 2023-04-18 ENCOUNTER — Other Ambulatory Visit: Payer: Self-pay | Admitting: Nurse Practitioner

## 2023-04-23 ENCOUNTER — Telehealth: Payer: Self-pay | Admitting: Nurse Practitioner

## 2023-04-23 NOTE — Telephone Encounter (Signed)
-----   Message from [facility] sent at 04/14/2023 12:46 PM EDT ----- Regarding: Blood pressure Call and get the blood pressure readings from home

## 2023-04-23 NOTE — Telephone Encounter (Signed)
Noted can we check and see if he has had any lightheadedness or dizziness?

## 2023-04-23 NOTE — Telephone Encounter (Signed)
Can we call and see what his blood pressure readings have been at home

## 2023-04-23 NOTE — Telephone Encounter (Signed)
Contacted pt. Left VM for patient to call back.

## 2023-04-23 NOTE — Telephone Encounter (Signed)
Patient called back in returning call her received. Informed that you all was calling in regards to his blood pressure. He stated that Monday his BP was 98/72, Tuesday it was 102/68, and he hasn't had a chance to take it today.

## 2023-04-25 NOTE — Telephone Encounter (Signed)
Spoke to pt, told pt Cable's response. Pt states he feels slightly dizzy at times. Pt states its only when he wakes up in the morning or when he sits for long periods. Call back # [phone]

## 2023-04-25 NOTE — Telephone Encounter (Signed)
 Left detailed voicemail for patient to call the office back.

## 2023-04-28 NOTE — Telephone Encounter (Signed)
Left message to return call to our office.  

## 2023-04-28 NOTE — Telephone Encounter (Signed)
Called patient reviewed all information and repeated back to me. Will call if any questions.  He states that today has been bad because he has been doing a lot of yard work and house work. As soon as he stands up he wants to fall back down. Patient sates that he knows he needs to be on a blood thinner but he is having a lot of bruising hands arms, feet and legs. He has noticed a little increase in the symptoms recently as well as the fatigue. He is concerned that the change could be because something is going on.

## 2023-04-28 NOTE — Telephone Encounter (Signed)
Continue to monitor blood pressure and symptoms. When he gets up in the morning or changes positions after sitting for a while he needs to stay in the new position for 30-60 seconds to make sure he does not get so dizzy that he passes out

## 2023-04-29 ENCOUNTER — Other Ambulatory Visit: Payer: Self-pay | Admitting: Nurse Practitioner

## 2023-04-29 NOTE — Telephone Encounter (Signed)
I recommend the patient make a follow up with his cardiologist

## 2023-04-29 NOTE — Telephone Encounter (Signed)
Left detailed voicemail for patient to call the office back.

## 2023-05-06 ENCOUNTER — Ambulatory Visit: Payer: Self-pay | Admitting: Gastroenterology

## 2023-05-09 NOTE — Telephone Encounter (Signed)
Called the patient's spouse. Left a message that an appointment for Jeffrey Hodges has been scheduled for 06/18/23 at 3:00 pm. If this is not good on his schedule, please call us back.

## 2023-05-13 ENCOUNTER — Ambulatory Visit (INDEPENDENT_AMBULATORY_CARE_PROVIDER_SITE_OTHER): Payer: Self-pay | Admitting: Nurse Practitioner

## 2023-05-13 ENCOUNTER — Encounter: Payer: Self-pay | Admitting: Nurse Practitioner

## 2023-05-13 VITALS — BP 110/82 | HR 66 | Temp 98.0°F | Ht 73.0 in | Wt 198.2 lb

## 2023-05-13 DIAGNOSIS — F411 Generalized anxiety disorder: Secondary | ICD-10-CM

## 2023-05-13 DIAGNOSIS — I214 Non-ST elevation (NSTEMI) myocardial infarction: Secondary | ICD-10-CM

## 2023-05-13 DIAGNOSIS — I10 Essential (primary) hypertension: Secondary | ICD-10-CM

## 2023-05-13 DIAGNOSIS — F410 Panic disorder [episodic paroxysmal anxiety] without agoraphobia: Secondary | ICD-10-CM | POA: Diagnosis not present

## 2023-05-13 MED ORDER — LORAZEPAM 0.5 MG PO TABS: 0.5000 mg | ORAL_TABLET | Freq: Two times a day (BID) | ORAL | 0 refills | Status: DC | PRN

## 2023-05-13 NOTE — Progress Notes (Signed)
Established Patient Office Visit  Subjective   Patient ID: Jeffrey Hodges, male    DOB: 1965-08-05  Age: 57 y.o. MRN: [id number]  Chief Complaint  Patient presents with   Hypertension    Pt states highest BP reading at home 187/117 and lowest 94/71    Anxiety      HTN: Patient had a hospital follow-up resulting in syncope and AKI.  Patient was on losartan and metoprolol at that time.  Last patient did not start back on any of the blood pressure medicine just to monitor his blood pressure at home.  States today readings from systolic of 187 down to 94 and diastolic of 70s up to 117.  He is here for recheck States that he will check daily to every other day. States that his hands will get numb with high or low blood pressure.  Patient states he has not followed up in some time  Anxiety: Patient currently maintained on fluoxetine 40 mg daily along with buspirone 10 mg twice daily. States that he is having panic attacks once every 2 days. States that he is stressed out anout finacial situation and life. States that he is trying to get on disabilty. States that he does have chest haviness that can last an hour     Review of Systems  Constitutional:  Negative for chills and fever.  Respiratory:  Negative for shortness of breath.   Cardiovascular:  Negative for chest pain.  Neurological:  Positive for tingling. Negative for dizziness and headaches.  Psychiatric/Behavioral:  Negative for hallucinations and suicidal ideas.       Objective:     BP 110/82   Pulse 66   Temp 98 F (36.7 C) (Oral)   Ht 6\' 1"  (1.854 m)   Wt 198 lb 3.2 oz (89.9 kg)   SpO2 99%   BMI 26.15 kg/m    Physical Exam Vitals and nursing note reviewed.  Constitutional:      Appearance: Normal appearance.  Cardiovascular:     Rate and Rhythm: Normal rate and regular rhythm.     Heart sounds: Normal heart sounds.  Pulmonary:     Effort: Pulmonary effort is normal.     Breath sounds: Normal breath sounds.   Neurological:     Mental Status: He is alert.      No results found for any visits on 05/13/23.    The ASCVD Risk score (Arnett DK, et al., 2019) failed to calculate for the following reasons:   The patient has a prior MI or stroke diagnosis    Assessment & Plan:   Problem List Items Addressed This Visit       Cardiovascular and Mediastinum   Primary hypertension    Patient still not currently on any antihypertensive medications.  He was on losartan metoprolol but discontinued he is having quite a blood pressure lability.  We will continue not taking any antihypertensives and he was instructed to follow-up with cardiology in regards to starting a beta-blocker may be extended release versus a different beta-blocker with lower dosing in setting of heart attack in the past.      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  Patient currently on fluoxetine 40 mg BuSpar 10 mg.  Continue medication as prescribed we will add lorazepam 0.5 mg twice daily as needed.  Patient also referred to psychology for therapy  to help with coping mechanisms and hopefully to avoid panic attacks.      Relevant Medications   LORazepam (ATIVAN) 0.5 MG tablet   Other Relevant Orders   Ambulatory referral to Psychology    Return in about 3 months (around 08/13/2023) for CPE and Labs.    Audria Nine, NP

## 2023-05-13 NOTE — Assessment & Plan Note (Signed)
Patient still not currently on any antihypertensive medications.  He was on losartan metoprolol but discontinued he is having quite a blood pressure lability.  We will continue not taking any antihypertensives and he was instructed to follow-up with cardiology in regards to starting a beta-blocker may be extended release versus a different beta-blocker with lower dosing in setting of heart attack in the past.

## 2023-05-13 NOTE — Patient Instructions (Addendum)
Nice to see you today I want to see you in 3 months for your physical and recheck  Schedule an appointment with the cardiologist

## 2023-05-13 NOTE — Assessment & Plan Note (Signed)
History of the same.  Patient currently on fluoxetine 40 mg BuSpar 10 mg.  Continue medication as prescribed we will add lorazepam 0.5 mg twice daily as needed.  Patient also referred to psychology for therapy to help with coping mechanisms and hopefully to avoid panic attacks.

## 2023-06-01 ENCOUNTER — Other Ambulatory Visit: Payer: Self-pay | Admitting: Cardiology

## 2023-06-08 ENCOUNTER — Other Ambulatory Visit: Payer: Self-pay | Admitting: Nurse Practitioner

## 2023-06-08 DIAGNOSIS — F410 Panic disorder [episodic paroxysmal anxiety] without agoraphobia: Secondary | ICD-10-CM

## 2023-06-08 DIAGNOSIS — F411 Generalized anxiety disorder: Secondary | ICD-10-CM

## 2023-06-18 ENCOUNTER — Ambulatory Visit: Payer: BC Managed Care – PPO | Admitting: Physician Assistant

## 2023-06-20 ENCOUNTER — Ambulatory Visit: Payer: BC Managed Care – PPO | Admitting: Physician Assistant

## 2023-07-01 ENCOUNTER — Ambulatory Visit: Payer: BC Managed Care – PPO | Admitting: Psychology

## 2023-07-30 ENCOUNTER — Ambulatory Visit: Payer: BC Managed Care – PPO | Admitting: Psychology

## 2023-07-30 ENCOUNTER — Other Ambulatory Visit: Payer: Self-pay | Admitting: Nurse Practitioner

## 2023-07-30 DIAGNOSIS — F411 Generalized anxiety disorder: Secondary | ICD-10-CM | POA: Diagnosis not present

## 2023-07-30 DIAGNOSIS — F410 Panic disorder [episodic paroxysmal anxiety] without agoraphobia: Secondary | ICD-10-CM

## 2023-07-30 NOTE — Progress Notes (Signed)
 Comprehensive Clinical Assessment (CCA) Note  07/30/2023 Jeffrey Hodges [id number]  Time Spent: 11:03  am - 11:45 am: 42 Minutes  Chief Complaint: No chief complaint on file.  Visit Diagnosis: GAD   Guardian/Payee:  self.     Paperwork requested: No   Reason for Visit /Presenting Problem: Anxiety and Panic  Mental Status Exam: Appearance:   Casual     Behavior:  Appropriate  Motor:  Normal  Speech/Language:   Slow  Affect:  Congruent  Mood:  dysthymic  Thought process:  normal  Thought content:    WNL  Sensory/Perceptual disturbances:    WNL  Orientation:  oriented to person, place, time/date, and situation  Attention:  Good  Concentration:  Good  Memory:  WNL  Fund of knowledge:   Good  Insight:    Good  Judgment:   Good  Impulse Control:  Good   Reported Symptoms:  Anxiety, panic, depression.   Risk Assessment: Danger to Self:  No Self-injurious Behavior: No Danger to Others: No Duty to Warn:no Physical Aggression / Violence:No  Access to Firearms a concern: No  Gang Involvement:No  Patient / guardian was educated about steps to take if suicide or homicide risk level increases between visits: yes While future psychiatric events cannot be accurately predicted, the patient does not currently require acute inpatient psychiatric care and does not currently meet [place]  involuntary commitment criteria.  In case of a mental health emergency:  29 - confidential suicide hotline. [facility]iting [facility]        [address] [place], [place] [address]       [phone] 3.   911  4.   Visiting Nearest ED.    Substance Abuse History: Current substance abuse: No     Caffeine: None. Tobacco: Vaping Alcohol:  None. Substance use: None.   Past Psychiatric History:   Previous psychological history is significant for anxiety Outpatient Providers:  Therapy around 1 year ago, Jearlean Mince. He was recommended to go by his  employer.  History of Psych Hospitalization: No  Psychological Testing:  No.     Jeffrey Hodges is unaware of any familial mental health or substance use concerns.   Abuse History:  Victim of: No.,  na    Report needed: No. Victim of Neglect:No. Perpetrator of  na   Witness / Exposure to Domestic Violence: No   Protective Services Involvement: No  Witness to [employer] Violence:  No   Family History:  Family History  Problem Relation Age of Onset   CAD Neg Hx    CVA Neg Hx    Diabetes Neg Hx     Living situation: the patient lives with their family  Sexual Orientation: Straight  Relationship Status: married  Name of spouse / other:[name] (10 years).  If a parent, number of children / ages:     Summer  (24), Avril (21), Savannah (17), Addison (14)  Support Systems: spouse  Surveyor, quantity Stress:  Yes , resigned from job after stents were placed. 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   Income/Employment/Disability: No income  Financial planner: No   Educational History: Education:  associates degree.   Religion/Sprituality/World View: NA  Any cultural differences that may affect / interfere with treatment:  not applicable   Recreation/Hobbies: Video games, playing guitar, Hockey   Stressors: Financial difficulties   Health problems    Strengths: Family, Journalist, newspaper, and Able to Communicate Effectively  Barriers:  NA  Legal History: Pending legal issue / charges: The patient has no significant history of legal issues. 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 Swallow whole. 90 tablet 3   atorvastatin  (LIPITOR) 80 MG tablet Take 1 tablet (80 mg total) by mouth daily. 90 tablet 3   BRILINTA  90 MG TABS tablet Take 90 mg by mouth 2 (two) times daily.     busPIRone  (BUSPAR ) 10 MG tablet TAKE 1 TABLET BY MOUTH TWICE A DAY 60 tablet 2   FLUoxetine  (PROZAC ) 40 MG capsule TAKE 1 CAPSULE (40 MG TOTAL) BY MOUTH DAILY. 90 capsule 0   LORazepam  (ATIVAN ) 0.5 MG tablet TAKE 1 TABLET BY MOUTH 2 TIMES DAILY AS NEEDED FOR ANXIETY. 30 tablet 0   nitroGLYCERIN  (NITROSTAT ) 0.4 MG SL tablet Place 1 tablet (0.4 mg total) under the tongue every 5 (five) minutes x 3 doses as needed for chest pain. 25 tablet 3   ondansetron  (ZOFRAN -ODT) 4 MG disintegrating tablet Take 1 tablet (4 mg total) by mouth every 8 (eight) hours as needed for nausea or vomiting. 20 tablet 0   No current facility-administered medications for this visit.    No Known Allergies  Diagnoses:  Generalized anxiety disorder  Psychiatric Treatment: Yes , by PCP. See chart.   Plan of Care: OPT and continued med management.   Narrative:   Jeffrey Hodges participated from home, via video, is aware of tele-sessions limitations, and consented to treatment. Therapist participated from home office. We reviewed the limits of confidentiality prior to the start of the evaluation. Jeffrey Hodges expressed understanding and agreement to proceed. He was referred by his nurse practitioner due to panic and anxiety. Arland endorsed "having a  bunch of panic attacks and they got more and more frequent".He noted going to his PCP and being prescribed medication. He takes his medication consistently. He noted his current stressors include financial stressors, being unemployed health related stressors (two stents and possible liver "failure", his wive's recent diagnosis of MS (2023), his wife's unemployment, and unmanageable anxiety. He noted his onset of symptoms specifically a panic attack in 2000 and noted feeling confused and overwhelmed. He noted consistent worry since that time. He noted visiting the ED and his symptoms subsiding after a few hours. He noted that his panic reoccurred in early 2024 and sustained at a rate of 2-3 times per month. He noted that his anxiety is consistent, including his panic. His depressive symptoms include difficulty falling asleep, loss of interest, loss of appetite, difficulty concentrating, psychomotor retardation, feeling down, lethargy, he denied any SI despite his PHQ-9 reflecting this.  He noted "I don't remember filling that out". He denied any history of suicide. He noted having two cousins who committed suicide. His anxiety symptoms include feeling on edge, difficulty managing worry, worrying about different things, difficulty relaxing, restlessness, irritability, and feeling afraid something awful might happen, and panic. He had a history of counseling, for one session, but did not attend sessions due to work commitments. He was urged by his manager to seek treatment. He would benefit from counseling to address symptoms, process past events, bolster coping, and engage in enjoyable activities on a consistent basis. A follow-up was scheduled to create a treatment plan and begin treatment. Therapist answered all questions during the evaluation and contact information was provided. Although Jesean denied any SI, he endorsed SI via the PHQ-9. WE reviewed safety during the session and Yael was agreeable to employing these  tools should the need arise.   GAD-7: 21  PHQ-9: 21(*denied SI)  Belva Boyden, LCSW

## 2023-08-05 ENCOUNTER — Encounter: Payer: Self-pay | Admitting: Gastroenterology

## 2023-08-06 ENCOUNTER — Ambulatory Visit: Payer: Self-pay | Admitting: Gastroenterology

## 2023-08-12 ENCOUNTER — Ambulatory Visit: Payer: BC Managed Care – PPO | Admitting: Psychology

## 2023-08-14 ENCOUNTER — Encounter: Payer: BC Managed Care – PPO | Admitting: Nurse Practitioner

## 2023-08-28 ENCOUNTER — Other Ambulatory Visit: Payer: Self-pay | Admitting: Nurse Practitioner

## 2023-08-28 DIAGNOSIS — F410 Panic disorder [episodic paroxysmal anxiety] without agoraphobia: Secondary | ICD-10-CM

## 2023-09-04 ENCOUNTER — Other Ambulatory Visit: Payer: Self-pay | Admitting: Nurse Practitioner

## 2023-09-05 ENCOUNTER — Telehealth: Payer: Self-pay | Admitting: Gastroenterology

## 2023-09-05 ENCOUNTER — Ambulatory Visit: Payer: Self-pay | Admitting: Gastroenterology

## 2023-09-05 NOTE — Telephone Encounter (Signed)
 Inbound call to cancel OV. PT is sick.

## 2023-09-05 NOTE — Telephone Encounter (Signed)
 Lvmtcb, sent mychart message

## 2023-09-05 NOTE — Telephone Encounter (Signed)
 Pease call patient and schedule cpe with labs. Cancelled appointment in January. Send back when scheduled and we will have [name] review for refills.

## 2023-09-08 NOTE — Telephone Encounter (Signed)
 Lvmtcb

## 2023-09-09 NOTE — Telephone Encounter (Signed)
 LVM for patient to c/b and schedule.

## 2023-10-09 ENCOUNTER — Other Ambulatory Visit: Payer: Self-pay | Admitting: Nurse Practitioner

## 2023-10-20 ENCOUNTER — Telehealth: Payer: Self-pay

## 2023-10-20 ENCOUNTER — Encounter: Payer: Self-pay | Admitting: Gastroenterology

## 2023-10-20 ENCOUNTER — Other Ambulatory Visit (INDEPENDENT_AMBULATORY_CARE_PROVIDER_SITE_OTHER)

## 2023-10-20 ENCOUNTER — Ambulatory Visit: Payer: BC Managed Care – PPO | Admitting: Gastroenterology

## 2023-10-20 VITALS — BP 118/70 | HR 70 | Ht 73.0 in | Wt 199.0 lb

## 2023-10-20 DIAGNOSIS — K709 Alcoholic liver disease, unspecified: Secondary | ICD-10-CM

## 2023-10-20 DIAGNOSIS — F1010 Alcohol abuse, uncomplicated: Secondary | ICD-10-CM

## 2023-10-20 DIAGNOSIS — R296 Repeated falls: Secondary | ICD-10-CM | POA: Diagnosis not present

## 2023-10-20 DIAGNOSIS — K7010 Alcoholic hepatitis without ascites: Secondary | ICD-10-CM

## 2023-10-20 DIAGNOSIS — Z1211 Encounter for screening for malignant neoplasm of colon: Secondary | ICD-10-CM

## 2023-10-20 DIAGNOSIS — R413 Other amnesia: Secondary | ICD-10-CM

## 2023-10-20 LAB — CBC WITH DIFFERENTIAL/PLATELET
Basophils Absolute: 0 10*3/uL (ref 0.0–0.1)
Basophils Relative: 1.2 % (ref 0.0–3.0)
Eosinophils Absolute: 0.1 10*3/uL (ref 0.0–0.7)
Eosinophils Relative: 2.1 % (ref 0.0–5.0)
HCT: 36.7 % — ABNORMAL LOW (ref 39.0–52.0)
Hemoglobin: 12.2 g/dL — ABNORMAL LOW (ref 13.0–17.0)
Lymphocytes Relative: 21.5 % (ref 12.0–46.0)
Lymphs Abs: 0.7 10*3/uL (ref 0.7–4.0)
MCHC: 33.3 g/dL (ref 30.0–36.0)
MCV: 109.6 fl — ABNORMAL HIGH (ref 78.0–100.0)
Monocytes Absolute: 0.3 10*3/uL (ref 0.1–1.0)
Monocytes Relative: 10.4 % (ref 3.0–12.0)
Neutro Abs: 2.1 10*3/uL (ref 1.4–7.7)
Neutrophils Relative %: 64.8 % (ref 43.0–77.0)
Platelets: 163 10*3/uL (ref 150.0–400.0)
RBC: 3.35 Mil/uL — ABNORMAL LOW (ref 4.22–5.81)
RDW: 20.3 % — ABNORMAL HIGH (ref 11.5–15.5)
WBC: 3.2 10*3/uL — ABNORMAL LOW (ref 4.0–10.5)

## 2023-10-20 LAB — AMMONIA: Ammonia: 55 umol/L — ABNORMAL HIGH (ref 11–35)

## 2023-10-20 LAB — HEPATIC FUNCTION PANEL
ALT: 39 U/L (ref 0–53)
AST: 64 U/L — ABNORMAL HIGH (ref 0–37)
Albumin: 3 g/dL — ABNORMAL LOW (ref 3.5–5.2)
Alkaline Phosphatase: 207 U/L — ABNORMAL HIGH (ref 39–117)
Bilirubin, Direct: 0.2 mg/dL (ref 0.0–0.3)
Total Bilirubin: 0.7 mg/dL (ref 0.2–1.2)
Total Protein: 6.5 g/dL (ref 6.0–8.3)

## 2023-10-20 LAB — GAMMA GT: GGT: 477 U/L — ABNORMAL HIGH (ref 7–51)

## 2023-10-20 LAB — BASIC METABOLIC PANEL WITH GFR
BUN: 9 mg/dL (ref 6–23)
CO2: 30 meq/L (ref 19–32)
Calcium: 8.1 mg/dL — ABNORMAL LOW (ref 8.4–10.5)
Chloride: 102 meq/L (ref 96–112)
Creatinine, Ser: 0.67 mg/dL (ref 0.40–1.50)
GFR: 105.25 mL/min (ref >60.00)
Glucose, Bld: 96 mg/dL (ref 70–99)
Potassium: 3.8 meq/L (ref 3.5–5.1)
Sodium: 139 meq/L (ref 135–145)

## 2023-10-20 LAB — IBC + FERRITIN
Ferritin: 539.4 ng/mL — ABNORMAL HIGH (ref 22.0–322.0)
Iron: 123 ug/dL (ref 42–165)
Saturation Ratios: 56.3 % — ABNORMAL HIGH (ref 20.0–50.0)
TIBC: 218.4 ug/dL — ABNORMAL LOW (ref 250.0–450.0)
Transferrin: 156 mg/dL — ABNORMAL LOW (ref 212.0–360.0)

## 2023-10-20 LAB — PROTIME-INR
INR: 1.1 ratio — ABNORMAL HIGH (ref 0.8–1.0)
Prothrombin Time: 11.6 s (ref 9.6–13.1)

## 2023-10-20 LAB — VITAMIN D 25 HYDROXY (VIT D DEFICIENCY, FRACTURES): VITD: 8.88 ng/mL — ABNORMAL LOW (ref 30.00–100.00)

## 2023-10-20 LAB — FOLATE: Folate: 2.3 ng/mL — ABNORMAL LOW (ref >5.9)

## 2023-10-20 LAB — VITAMIN B12: Vitamin B-12: 340 pg/mL (ref 211–911)

## 2023-10-20 MED ORDER — NA SULFATE-K SULFATE-MG SULF 17.5-3.13-1.6 GM/177ML PO SOLN: 1.0000 | Freq: Once | ORAL | 0 refills | Status: AC

## 2023-10-20 NOTE — Progress Notes (Unsigned)
 Discussed the use of AI scribe software for clinical note transcription with the patient, who gave verbal consent to proceed.  HPI : Jeffrey Hodges is a 58 y.o. male with a history of coronary artery disease status post stent placement in February 2024 who presents for follow-up after recent hospitalization for syncope where he was also found to have elevated liver enzymes in a mixed pattern (AST 400 ALT 200, T. bili 3.8).  He had acute kidney injury, with a creatinine of 2 on admission, 1.9 on discharge his hemoglobin was 12 on admission, but declined to 10 by discharge.  He is also noted to have thrombocytopenia, platelets 117 on admission, 54 at discharge. An ultrasound on admission showed diffuse fatty change to the liver, without obvious cirrhosis, as well as a dilated gallbladder with slightly thickened wall, possibly suggestive of acalculous cholecystitis. The patient had been recommended to undergo further testing to include HIDA scan, further testing for his kidney disease, and potential endoscopic evaluation given his drop in hemoglobin, but the patient was adamant to be discharged.  He had denied any alcohol use for many years at the time of his admission, even though his blood alcohol level was elevated.  His labs were checked 10 days after discharge, and his hemoglobin had improved back to 11, and platelets were 185. His liver enzymes had improved, but were still abnormal, T. bili 2.1, alk phos 221, AST 58, ALT 50. His kidney function had improved significantly from 1.9 on September 20 to 1.13 on September 30.  Today, he is accompanied by his wife who helps provide history.  He continues to drink on a daily basis, typically 2 large bottles of Mad Dog 20/20 per day. He denies any yellowing of his eyes or darkening of his urine, like what he had in September.  He denies any abdominal swelling, but has had persistent lower extremity swelling for about a year now.  He does experience  frequent nausea and vomiting.  He denies any blood in his vomit.  He has regular bowel movements with brown stool 1 or 2 times a day.  He denies problems with diarrhea, constipation, melena or hematochezia.  He has memory issues, such as difficulty recalling recent events.  No clear history of head fog, overt confusion, slowness of thought or speech.  No incidents of getting lost or disoriented.  He experiences easy bruising, which he associates with his use of Brilinta, a blood thinner he has been taking since February 2024 following a stent placement. The bruising began after an incident while mowing the lawn about a year ago, where he sustained cuts on his arms that have not healed.   He describes a history of falls, with a recent incident occurring three weeks ago where he fell flat on his face and injured his nose. He reports frequent falls and attributes them to leg weakness.   He feels unsteady on his feet and often uses his hands to stabilize himself while walking. No vertigo, but balance is poor.  He denies losing consciousness or getting severely lightheaded or dizzy preceding these falls.  He has never undergone an EGD or colonoscopy. No family history of colon cancer or liver disease.   He is currently unemployed and living off a 401k and pension, experiencing significant stress due to his and his partner's health issues and unemployment.     HIDA Sept 20, 2024 IMPRESSION: No common duct or cystic duct obstruction. Slight delayed clearance of the radiotracer from  the blood pool. Please correlate with the hepatocellular function  RUQUS Sept 19, 2024 IMPRESSION: 1. Gallbladder dilatation with free wall thickness slightly prominent measuring 3.5 mm. There are no stones or positive sonographic Murphy's sign. 2. Findings could be due to chronic versus pain-medicated acalculous cholecystitis, reactive wall thickening from an adjacent inflammatory process, or hepatic dysfunction. 3.  Diffuse fatty replacement of the liver with hyperechoic parenchyma.  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   LEFT HEART CATH AND CORONARY ANGIOGRAPHY N/A 09/11/2022   Procedure: LEFT HEART CATH AND CORONARY ANGIOGRAPHY;  Surgeon: Orbie Pyo, MD;  Location: [facility];  Service: Cardiovascular;  Laterality: N/A;   Family History  Problem Relation Age of Onset   CAD Neg Hx    CVA Neg Hx    Diabetes Neg Hx    Social History   Tobacco Use   Smoking status: Every Day    Types: E-cigarettes    Passive exposure: Never   Smokeless tobacco: Never  Vaping Use   Vaping status: Every Day   Substances: Nicotine, Flavoring  Substance Use Topics   Alcohol use: Not Currently   Drug use: Yes    Types: Marijuana    Comment: once in a while. Last time was 3 weeks ago   Current Outpatient Medications  Medication Sig Dispense Refill   aspirin EC 81 MG tablet Take 1 tablet (81 mg total) by mouth daily. Swallow whole. 90 tablet 3   atorvastatin (LIPITOR) 80 MG tablet Take 1 tablet (80 mg total) by mouth daily. 90 tablet 3   BRILINTA 90 MG TABS tablet Take 90 mg by mouth 2 (two) times daily.     busPIRone (BUSPAR) 10 MG tablet TAKE 1 TABLET BY MOUTH TWICE A DAY 60 tablet 2   FLUoxetine (PROZAC) 40 MG capsule TAKE 1 CAPSULE (40 MG TOTAL) BY MOUTH DAILY. 90 capsule 0   LORazepam (ATIVAN) 0.5 MG tablet TAKE 1 TABLET BY MOUTH TWICE A DAY AS NEEDED FOR ANXIETY 30  tablet 0   nitroGLYCERIN (NITROSTAT) 0.4 MG SL tablet Place 1 tablet (0.4 mg total) under the tongue every 5 (five) minutes x 3 doses as needed for chest pain. 25 tablet 3   ondansetron (ZOFRAN-ODT) 4 MG disintegrating tablet Take 1 tablet (4 mg total) by mouth every 8 (eight) hours as needed for nausea or vomiting. 20 tablet 0   No current facility-administered medications for this visit.   No Known Allergies   Review of Systems: All systems reviewed and negative except where noted in HPI.    No results found.  Physical Exam: BP 118/70   Pulse 70   Ht 6\' 1"  (1.854 m)   Wt 199 lb (90.3 kg)   BMI 26.25 kg/m  Constitutional: Pleasant,well-developed, Caucasian male in no acute distress.  Accompanied by spouse HEENT: Normocephalic and atraumatic. Conjunctivae are normal. No scleral icterus. Neck supple.  Cardiovascular: Normal rate, regular rhythm.  Pulmonary/chest: Effort normal and breath sounds normal. No wheezing, rales or rhonchi. Abdominal: Soft, nondistended, nontender. Bowel sounds active throughout. There are no masses palpable. No hepatomegaly. Extremities: Trace bilateral pitting pretibial edema Lymphadenopathy: No cervical adenopathy noted. Neurological: Alert and oriented to person place and time.  No asterixis Skin: Skin is warm and dry. No rashes noted. Psychiatric: Normal mood and affect. Behavior is normal.  CBC    Component Value Date/Time   WBC 5.7 04/14/2023 0857   RBC 3.11 (L) 04/14/2023 0857   HGB 11.1 (L) 04/14/2023 0857   HCT 33.6 (L) 04/14/2023 0857   PLT 185.0 04/14/2023 0857   MCV 108.1 (H) 04/14/2023 0857   MCH 34.9 (H) 04/04/2023 0557   MCHC 33.1 04/14/2023 0857   RDW 20.1 (H) 04/14/2023 0857   LYMPHSABS 0.8 04/14/2023 0857   MONOABS 0.3 04/14/2023 0857   EOSABS 0.1 04/14/2023 0857   BASOSABS 0.0 04/14/2023 0857    CMP     Component Value Date/Time   NA 135 04/14/2023 0857   K 3.9 04/14/2023 0857   CL 100 04/14/2023 0857   CO2 26  04/14/2023 0857   GLUCOSE 96 04/14/2023 0857   BUN 11 04/14/2023 0857   CREATININE 1.13 04/14/2023 0857   CALCIUM 8.2 (L) 04/14/2023 0857   PROT 6.3 04/14/2023 0857   ALBUMIN 2.7 (L) 04/14/2023 0857   AST 58 (H) 04/14/2023 0857   ALT 50 04/14/2023 0857   ALKPHOS 221 (H) 04/14/2023 0857   BILITOT 2.1 (H) 04/14/2023 0857   GFRNONAA 41 (L) 04/04/2023 0557       Latest Ref Rng & Units 04/14/2023    8:57 AM 04/04/2023    5:57 AM 04/03/2023    2:12 PM  CBC EXTENDED  WBC 4.0 - 10.5 K/uL 5.7  2.8  2.9   RBC 4.22 - 5.81 Mil/uL 3.11  2.81  3.01   Hemoglobin 13.0 - 17.0 g/dL 40.3  9.8  47.4   HCT 25.9 - 52.0 % 33.6  27.2  28.7   Platelets 150.0 - 400.0 K/uL 185.0  54  62   NEUT# 1.4 - 7.7 K/uL 4.5     Lymph# 0.7 - 4.0 K/uL 0.8         ASSESSMENT AND PLAN:  58 year old male with hospital admission in September with labs consistent with alcoholic hepatitis, not meeting criteria for steroids.  No overt encephalopathy and no ascites on imaging.  He continues to drink on a daily basis.   Alcoholic liver disease The patient has a long history of heavy alcohol use, and continues to drink heavily.  His labs and symptoms were consistent with alcoholic hepatitis in September.  We discussed how most patients with alcoholic hepatitis most likely have underlying cirrhosis or advanced fibrosis of the liver.  We had a long discussion about the pathophysiology of alcohol induced liver disease, portal hypertension and the various complications that can arise from portal hypertension.  We discussed the very poor prognosis of patients who continue to drink once a diagnosis of cirrhosis has been established. We discussed how the only safe level of alcohol consumption going forward is no alcohol at all.  We discussed different modalities to help patients with alcohol cessation, ranging from inpatient or outpatient rehab, support groups such as AA and medical therapy.  The patient was interested in naltrexone as  an adjunctive therapy to reduce his cravings. We discussed the role of liver transplant for patients with cirrhosis who have  been abstinent from alcohol and have no other contraindications.   - Order blood tests to reassess liver function and rule out other causes of chronic liver disease, including viral hepatitis, autoimmune, and genetic causes. - Schedule elastography to assess liver stiffness and evaluate for cirrhosis. - Discuss the importance of eliminating alcohol to prevent further liver damage and complications. - Discuss potential use of naltrexone to reduce alcohol cravings, contingent on liver function test results. - Provide information on support programs and medications to assist with alcohol cessation.  Cirrhosis (suspected) Suspected underlying cirrhosis due to chronic alcohol use and alcoholic hepatitis. Symptoms include leg swelling and potential hepatic encephalopathy (memory issues). No official diagnosis of cirrhosis was made during the hospital stay, but the risk is high given the history and current symptoms. If cirrhosis is confirmed, he will be at increased risk for liver cancer and other complications. - Order elastography to assess liver stiffness and evaluate for cirrhosis. - If cirrhosis is confirmed, schedule an upper endoscopy to screen for esophageal varices. - If cirrhosis is confirmed, initiate liver cancer screening every six months.  Easy Bruising Easy bruising likely due to Brilinta use and potential liver disease affecting platelet count and clotting factors. He reports bruising after minor trauma and has a history of bleeding easily. The combination of Brilinta and potential liver disease may exacerbate bleeding tendencies. - Discuss the role of Brilinta and potential liver disease in easy bruising.  Syncope and Falls Episodes of syncope and falls, possibly related to muscle weakness from multiple sclerosis and potential hepatic encephalopathy. He reports  leg weakness and balance issues, requiring the use of hands for stability. No recent syncopal episodes reported, but frequent falls due to leg weakness. - Evaluate for potential hepatic encephalopathy if cirrhosis is confirmed.  Colorectal Cancer Screening He is due for colorectal cancer screening. No previous colonoscopy and no family history of colon cancer. He is at average risk but is on Brilinta, which may affect screening results. Colonoscopy is recommended over Cologuard due to potential false positives with Cologuard and the need for sedation if an upper endoscopy is required. - Schedule colonoscopy, pending cardiologist approval to hold Brilinta for a few days prior to the procedure. - Discuss the option of Cologuard as an alternat ive, but recommend colonoscopy due to potential false positives with Cologuard.  Follow-up Follow-up plans to reassess his condition and response to interventions. - Schedule follow-up appointment in six weeks to reassess alcohol use and discuss procedure outcomes. - Order blood tests and elastography as part of follow-up evaluation.        Eden Emms, NP

## 2023-10-20 NOTE — Telephone Encounter (Signed)
   Name: Jeffrey Hodges  DOB: 1965/12/03  MRN: [id number]  Primary Cardiologist: Nicki Guadalajara, MD  Chart reviewed as part of pre-operative protocol coverage. Because of Jeffrey Hodges's past medical history and time since last visit, he will require a follow-up in-office visit in order to better assess preoperative cardiovascular risk.  Pre-op covering staff: - Please schedule appointment and call patient to inform them. If patient already had an upcoming appointment within acceptable timeframe, please add "pre-op clearance" to the appointment notes so provider is aware. - Please contact requesting surgeon's office via preferred method (i.e, phone, fax) to inform them of need for appointment prior to surgery.   Carlos Levering, NP  10/20/2023, 9:36 AM

## 2023-10-20 NOTE — Telephone Encounter (Signed)
 [facility] Pre-operative Risk Assessment     Request for surgical clearance:     Endoscopy Procedure  What type of surgery is being performed?     Colonoscopy  When is this surgery scheduled?     11-12-23  What type of clearance is required ?   Pharmacy  Are there any medications that need to be held prior to surgery and how long? Blinta 5 day hold  Practice name and name of physician performing surgery?      [facility]  What is your office phone and fax number?      Phone- [phone]  Fax- [phone]  Anesthesia type (None, local, MAC, general) ?       MAC   Please route your response to [employer] ,CMA (AAMA)

## 2023-10-20 NOTE — Telephone Encounter (Signed)
Left message to call back and schedule in office appt for pre op clearance.

## 2023-10-20 NOTE — Patient Instructions (Signed)
 _______________________________________________________  If your blood pressure at your visit was 140/90 or greater, please contact your primary care physician to follow up on this.  _______________________________________________________  If you are age 56 or older, your body mass index should be between 23-30. Your Body mass index is 26.25 kg/m. If this is out of the aforementioned range listed, please consider follow up with your Primary Care Provider.  If you are age 31 or younger, your body mass index should be between 19-25. Your Body mass index is 26.25 kg/m. If this is out of the aformentioned range listed, please consider follow up with your Primary Care Provider.   ________________________________________________________  The [facility] providers would like to encourage you to use [facility] to communicate with providers for non-urgent requests or questions.  Due to long hold times on the telephone, sending your provider a message by [facility] may be a faster and more efficient way to get a response.  Please allow 48 business hours for a response.  Please remember that this is for non-urgent requests.  _______________________________________________________  We have sent the following medications to your pharmacy for you to pick up at your convenience: Suprep  Your provider has requested that you go to the basement level for lab work before leaving today. Press "B" on the elevator. The lab is located at the first door on the left as you exit the elevator.  You have been scheduled for an abdominal ultrasound at [facility] (1st floor of hospital) on 10-24-23 at 9am. Please arrive 15 minutes prior to your appointment for registration. Make certain not to have anything to eat or drink midnight prior to your appointment. Should you need to reschedule your appointment, please contact radiology at [phone]. This test typically takes about 30 minutes to perform.  You will be  contacted by our office prior to your procedure for directions on holding your blood thinner.  If you do not hear from our office 2 week prior to your scheduled procedure, please call [phone] to discuss.    You have been scheduled for a colonoscopy. Please follow written instructions given to you at your visit today.   If you use inhalers (even only as needed), please bring them with you on the day of your procedure.  DO NOT TAKE 7 DAYS PRIOR TO TEST- Trulicity (dulaglutide) Ozempic, Wegovy (semaglutide) Mounjaro (tirzepatide) Bydureon Bcise (exanatide extended release)  DO NOT TAKE 1 DAY PRIOR TO YOUR TEST Rybelsus (semaglutide) Adlyxin (lixisenatide) Victoza (liraglutide) Byetta (exanatide) ___________________________________________________________________________  It was a pleasure to see you today!  Thank you for trusting me with your gastrointestinal care!

## 2023-10-21 NOTE — Telephone Encounter (Signed)
 Will there be another try with this patient? I Called yesterday and got the significant other

## 2023-10-21 NOTE — Telephone Encounter (Signed)
 LVM for patient to call back he will need to reschedule his procedure date to after his cardiologist appointment now

## 2023-10-21 NOTE — Telephone Encounter (Addendum)
 Tried to call pt to schedule appt in office for preop clearance. No answer. I do see that the pt called today and scheduled an appt in office for 12/01/23 for his 1 yr f/u.   Pt will be seen 12/01/23 for preop clearance.

## 2023-10-22 LAB — MITOCHONDRIAL ANTIBODIES: Mitochondrial M2 Ab, IgG: <=20 U (ref <=20.0)

## 2023-10-22 LAB — ANTI-SMOOTH MUSCLE ANTIBODY, IGG: Actin (Smooth Muscle) Antibody (IGG): <20 U (ref <20)

## 2023-10-22 LAB — ALPHA-1-ANTITRYPSIN: A-1 Antitrypsin, Ser: 153 mg/dL (ref 83–199)

## 2023-10-22 LAB — AFP TUMOR MARKER: AFP-Tumor Marker: 19.3 ng/mL — ABNORMAL HIGH (ref <6.1)

## 2023-10-22 NOTE — Telephone Encounter (Signed)
 Wife agreed to May 30th and instructions mailed and in [place] for them to view and she is aware and will call with questions or concerns

## 2023-10-22 NOTE — Addendum Note (Signed)
 Addended by: Alberteen Sam E on: 10/22/2023 12:53 PM   Modules accepted: Orders

## 2023-10-22 NOTE — Telephone Encounter (Signed)
 Inbound call from patient's wife returning phone call. Requesting a call back to her at [phone]. Please advise, thank you.

## 2023-10-24 ENCOUNTER — Ambulatory Visit (HOSPITAL_COMMUNITY)

## 2023-10-27 ENCOUNTER — Other Ambulatory Visit: Payer: Self-pay | Admitting: *Deleted

## 2023-10-27 ENCOUNTER — Telehealth: Payer: Self-pay | Admitting: Gastroenterology

## 2023-10-27 ENCOUNTER — Encounter: Payer: Self-pay | Admitting: Gastroenterology

## 2023-10-27 DIAGNOSIS — R7989 Other specified abnormal findings of blood chemistry: Secondary | ICD-10-CM

## 2023-10-27 MED ORDER — VITAMIN D (ERGOCALCIFEROL) 1.25 MG (50000 UNIT) PO CAPS: ORAL_CAPSULE | ORAL | 0 refills | Status: AC

## 2023-10-27 MED ORDER — NALTREXONE HCL 50 MG PO TABS: 50.0000 mg | ORAL_TABLET | Freq: Every day | ORAL | 3 refills | Status: DC

## 2023-10-27 MED ORDER — FOLIC ACID 1 MG PO TABS: 1.0000 mg | ORAL_TABLET | Freq: Every day | ORAL | 1 refills | Status: DC

## 2023-10-27 MED ORDER — VITAMIN B-12 1000 MCG PO TABS: 1000.0000 ug | ORAL_TABLET | Freq: Every day | ORAL | 1 refills | Status: AC

## 2023-10-27 NOTE — Progress Notes (Signed)
 Mr. Lortie,  Your labs showed elevated iron indices.  This is related to inflammation in the liver.  Although less likely, elevated iron levels can also be from a genetic condition called hemochromatosis.   I would like to check for a gene mutation associated with hemochromatosis.  This will involve another blood test and can be done at your convenience.  Your vitamin D levels and folate levels were quite low.  I recommend taking some supplements to correct these levels. Please take ergocalciferol (vitamin D) 50,000 units once a week for 24 weeks. Please take folic acid 1 mg daily. Your vitamin B12 levels were on the lower end of normal.  Because of your folate deficiency, as well as your balance issues, I would recommend taking B12 supplements daily as well.  Please take cyanocobalamin 1000mcg PO daily  Your ammonia was elevated.  This supports the notion that some of your memory difficulties may be related to your liver disease.  Will await results of elastography.  If this shows elevated liver stiffness, I recommend we start a medication to treat hepatic encephalopathy.  Your liver tests are improved from when you were in the hospital, are still elevated and indicate ongoing liver injury from alcohol.  Your kidney function recovered completely following your hospital, and is normal.  Your AFP level was elevated.  This is a tumor marker that is associated with liver cancer, but the AFP level can be elevated for other reasons to include liver disease itself. Your upcoming ultrasound will show whether there any suspicious liver lesions that require further evaluation. I recommend we closely follow the AFP if the ultrasound is normal, and check it again in 3 months.  If you would like to start naltrexone to help with your alcohol cravings, I think it is safe to do so.  Team,  Can you please place orders for the following:  HFE gene testing  Naltrexone 50 mg PO daily #90 rf3 Ergocalciferol  50,000 units, 1 capsule PO qweek x 24 weeks #24 rf0 Folic acid 1 mg PO daily #90 rf1 Cyanocobalamin 1,000 mcg PO daily #90 RF1  Repeat the following labs in 3 months: Folate B12 Vitamin D AFP

## 2023-10-27 NOTE — Telephone Encounter (Signed)
 See 10/20/23 lab results notes for additional details.

## 2023-10-27 NOTE — Telephone Encounter (Signed)
 Patients wife Edsel Grace called requesting to speak with nurse. Stating to call patient back for results. Please advise.

## 2023-10-30 ENCOUNTER — Other Ambulatory Visit: Payer: Self-pay | Admitting: Gastroenterology

## 2023-10-30 ENCOUNTER — Ambulatory Visit (HOSPITAL_COMMUNITY)
Admission: RE | Admit: 2023-10-30 | Discharge: 2023-10-30 | Disposition: A | Discharge status: Home / Self Care | Source: Ambulatory Visit | Attending: Gastroenterology | Admitting: Gastroenterology

## 2023-10-30 ENCOUNTER — Other Ambulatory Visit (INDEPENDENT_AMBULATORY_CARE_PROVIDER_SITE_OTHER)

## 2023-10-30 DIAGNOSIS — K709 Alcoholic liver disease, unspecified: Secondary | ICD-10-CM

## 2023-10-30 DIAGNOSIS — K7010 Alcoholic hepatitis without ascites: Secondary | ICD-10-CM

## 2023-10-30 DIAGNOSIS — R296 Repeated falls: Secondary | ICD-10-CM

## 2023-10-30 DIAGNOSIS — R7989 Other specified abnormal findings of blood chemistry: Secondary | ICD-10-CM | POA: Diagnosis not present

## 2023-10-30 DIAGNOSIS — Z1211 Encounter for screening for malignant neoplasm of colon: Secondary | ICD-10-CM

## 2023-10-30 DIAGNOSIS — R413 Other amnesia: Secondary | ICD-10-CM

## 2023-10-30 DIAGNOSIS — R188 Other ascites: Secondary | ICD-10-CM | POA: Diagnosis not present

## 2023-10-30 LAB — B12 AND FOLATE PANEL
Folate: 2.2 ng/mL — ABNORMAL LOW (ref >5.9)
Vitamin B-12: 255 pg/mL (ref 211–911)

## 2023-10-30 LAB — VITAMIN D 25 HYDROXY (VIT D DEFICIENCY, FRACTURES): VITD: <7 ng/mL — ABNORMAL LOW (ref 30.00–100.00)

## 2023-11-03 ENCOUNTER — Encounter: Payer: BC Managed Care – PPO | Admitting: Nurse Practitioner

## 2023-11-03 ENCOUNTER — Encounter: Payer: Self-pay | Admitting: Gastroenterology

## 2023-11-03 LAB — AFP TUMOR MARKER: AFP-Tumor Marker: 12.9 ng/mL — ABNORMAL HIGH (ref <6.1)

## 2023-11-03 NOTE — Progress Notes (Signed)
 Jeffrey Hodges,  I had recommended that you get a gene test for hemochromatosis now, and then have repeat B12, folate and vitamin D  levels drawn in 3 months.  However, it appears that the lab ran the tests for the B12, folate and vitamin D , and did not run the test for hemochromatosis.  Unfortunately,  we still need to get the blood test for hemochromatosis.  This can be done at your convenience, but will require you to come back to our lab for another blood draw.  The order for the test is in.  I am not sure why it was not processed.  I am sorry for this lab error.

## 2023-11-05 ENCOUNTER — Encounter: Payer: Self-pay | Admitting: Gastroenterology

## 2023-11-05 NOTE — Progress Notes (Signed)
 Mr. Dinneen, The ultrasound of your liver showed increased echotexture which is consistent with fatty infiltration.  It appears that the elastography portion of the exam was not performed.  Presumably, this was because the ultrasound was notable for fluid around the liver (ascites).  Elastography results are not accurate in the setting of ascites.  The presence of ascites is very strongly suggestive of cirrhosis as well. I think it is likely that you have cirrhosis based on the presence of ascites.  Because of this, I would recommend we perform an upper endoscopy at the time of your colonoscopy to screen for esophageal varices (enlarged veins in the esophagus related to cirrhosis).  Team, Can you please add an upper endoscopy to the patient's colonoscopy already scheduled on May 30?  There is currently a slot available at 2:30 (the patient's colonoscopy is scheduled at 3:00).  If possible, I would like the patient to come at 2:30 instead of 3:00

## 2023-11-12 ENCOUNTER — Encounter: Admitting: Gastroenterology

## 2023-12-01 ENCOUNTER — Ambulatory Visit: Admitting: Nurse Practitioner

## 2023-12-01 NOTE — Progress Notes (Deleted)
 Office Visit    Patient Name: Jeffrey Hodges Date of Encounter: 12/01/2023  Primary Care Provider:  Dorothe Gaster, NP Primary Cardiologist:  Magnus Schuller, MD  Chief Complaint    58 year old male with a history of CAD s/p NSTEMI, DES-D1 in 08/2022, hypertension, hyperlipidemia, elevated LFTs, anxiety, and tobacco use who presents for follow-up related to CAD.  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   Mid LAD lesion is 30% stenosed.   RPDA lesion is 40% stenosed.   A stent was successfully placed.   Post intervention, there is a 0% residual stenosis.  1.  High-grade and hazy first diagonal lesion; given lateral T wave inversions and anterolateral wall motion abnormality on ventriculography, this was treated with percutaneous coronary invention with 1 drug-eluting stent.  There was plaque shift into the ostium of the diagonal but due to the lack of chest pain and sustained TIMI-3 flow,  further interventions were deferred. 2.  Mild obstructive coronary artery disease of the right, mid LAD, and left circumflex arteries. 3.  Ventriculography with ejection fraction of approximately 50% with anterolateral wall motion abnormality.  Commendation: Dual antiplatelet therapy for preferably 1 year and optimal medical therapy for cardiovascular disease.  Findings Coronary Findings Diagnostic  Dominance: Right  Left Anterior Descending Mid LAD lesion is 30% stenosed.  First Diagonal Branch 1st Diag lesion is 85% stenosed.  Left Circumflex There is mild diffuse disease throughout the vessel.  Right Coronary Artery The vessel exhibits minimal luminal irregularities.  Right Posterior Descending Artery RPDA lesion is 40% stenosed.  Intervention  1st Diag lesion Stent A stent was successfully placed. Post-Intervention Lesion Assessment The intervention was successful. Pre-interventional TIMI flow is 3. Post-intervention TIMI flow is 3. There is a 0% residual stenosis post intervention.     ECHOCARDIOGRAM  ECHOCARDIOGRAM COMPLETE 04/03/2023  Narrative ECHOCARDIOGRAM REPORT    Patient Name:   Jeffrey Hodges Date of Exam: 04/03/2023 Medical Rec #:  [id number]     Height:       73.0 in Accession #:    [id number]    Weight:       210.0 lb Date of Birth:  [birth year]    BSA:          2.197 m Patient Age:    56 years      BP:           136/90 mmHg Patient Gender: M             HR:  62 bpm. Exam Location:  Inpatient  Procedure: 2D Echo, Cardiac Doppler and Color Doppler  Indications:    Other abnormalities of the heart R008.8  History:        Patient has prior history of Echocardiogram examinations, most recent 09/12/2022. Previous Myocardial Infarction and CAD, Signs/Symptoms:Syncope; Risk Factors:Hypertension, Dyslipidemia and Current Smoker.  Sonographer:    Terrilee Few Referring Phys: [id number] TAYE T GONFA  IMPRESSIONS   1. Left ventricular  ejection fraction, by estimation, is 55 to 60%. The left ventricle has normal function. The left ventricle has no regional wall motion abnormalities. Left ventricular diastolic parameters were normal. 2. Right ventricular systolic function is normal. The right ventricular size is mildly enlarged. 3. The mitral valve is normal in structure. Trivial mitral valve regurgitation. No evidence of mitral stenosis. 4. The aortic valve is tricuspid. There is mild calcification of the aortic valve. There is mild thickening of the aortic valve. Aortic valve regurgitation is not visualized. 5. The inferior vena cava is dilated in size with >50% respiratory variability, suggesting right atrial pressure of 8 mmHg.  Comparison(s): Prior images reviewed side by side. IVC has dilated since last study.  FINDINGS Left Ventricle: Left ventricular ejection fraction, by estimation, is 55 to 60%. The left ventricle has normal function. The left ventricle has no regional wall motion abnormalities. The left ventricular internal cavity size was normal in size. There is no left ventricular hypertrophy. Left ventricular diastolic parameters were normal.  Right Ventricle: The right ventricular size is mildly enlarged. No increase in right ventricular wall thickness. Right ventricular systolic function is normal.  Left Atrium: Left atrial size was normal in size.  Right Atrium: Right atrial size was normal in size.  Pericardium: There is no evidence of pericardial effusion.  Mitral Valve: The mitral valve is normal in structure. Trivial mitral valve regurgitation. No evidence of mitral valve stenosis.  Tricuspid Valve: The tricuspid valve is normal in structure. Tricuspid valve regurgitation is not demonstrated. No evidence of tricuspid stenosis.  Aortic Valve: The aortic valve is tricuspid. There is mild calcification of the aortic valve. There is mild thickening of the aortic valve. Aortic valve regurgitation is not  visualized. Aortic valve peak gradient measures 9.0 mmHg.  Pulmonic Valve: The pulmonic valve was normal in structure. Pulmonic valve regurgitation is not visualized. No evidence of pulmonic stenosis.  Aorta: The aortic root and ascending aorta are structurally normal, with no evidence of dilitation.  Venous: The inferior vena cava is dilated in size with greater than 50% respiratory variability, suggesting right atrial pressure of 8 mmHg.  IAS/Shunts: The atrial septum is grossly normal.   LEFT VENTRICLE PLAX 2D LVIDd:         5.20 cm   Diastology LVIDs:         3.70 cm   LV e' medial:    8.24 cm/s LV PW:         0.90 cm   LV E/e' medial:  9.6 LV IVS:        0.80 cm   LV e' lateral:   10.40 cm/s LVOT diam:     2.20 cm   LV E/e' lateral: 7.6 LV SV:         73 LV SV Index:   33 LVOT Area:     3.80 cm   RIGHT VENTRICLE             IVC RV S prime:     15.10 cm/s  IVC diam: 2.40 cm  TAPSE (M-mode): 2.0 cm  LEFT ATRIUM           Index        RIGHT ATRIUM           Index LA diam:      3.50 cm 1.59 cm/m   RA Area:     14.00 cm LA Vol (A2C): 39.7 ml 18.07 ml/m  RA Volume:   34.90 ml  15.89 ml/m LA Vol (A4C): 43.7 ml 19.89 ml/m AORTIC VALVE AV Area (Vmax): 2.42 cm AV Vmax:        150.00 cm/s AV Peak Grad:   9.0 mmHg LVOT Vmax:      95.60 cm/s LVOT Vmean:     61.100 cm/s LVOT VTI:       0.191 m  AORTA Ao Root diam: 3.70 cm Ao Asc diam:  3.50 cm  MITRAL VALVE               TRICUSPID VALVE MV Area (PHT): 3.53 cm    TR Peak grad:   13.5 mmHg MV Decel Time: 215 msec    TR Vmax:        184.00 cm/s MR Peak grad: 29.8 mmHg MR Vmax:      273.00 cm/s  SHUNTS MV E velocity: 79.10 cm/s  Systemic VTI:  0.19 m MV A velocity: 74.70 cm/s  Systemic Diam: 2.20 cm MV E/A ratio:  1.06  Gloriann Larger MD Electronically signed by Gloriann Larger MD Signature Date/Time: 04/03/2023/12:11:06 PM    Final           ______________________________________________________________________________________________     Recent Labs: 04/02/2023: TSH 0.441 10/20/2023: ALT 39; BUN 9; Creatinine, Ser 0.67; Hemoglobin 12.2; Platelets 163.0; Potassium 3.8; Sodium 139  Recent Lipid Panel    Component Value Date/Time   CHOL 301 (H) 09/10/2022 1005   TRIG 82 09/10/2022 1005   HDL 78 09/10/2022 1005   CHOLHDL 3.9 09/10/2022 1005   VLDL 16 09/10/2022 1005   LDLCALC 207 (H) 09/10/2022 1005    History of Present Illness    58 year old male with the above past medical history including CAD s/p NSTEMI, DES-D1 in 08/2022, hypertension, hyperlipidemia, elevated LFTs, anxiety, and tobacco use.   He was hospitalized in 08/2022 in the setting of NSTEMI. Cardiac catheterization revealed 85% first diagonal lesion treated with DES x 1, otherwise mild nonobstructive disease in the RCA, mid LAD and circumflex, treated medically.  He was started on aspirin  and Brilinta  with metoprolol , losartan , amlodipine , and atorvastatin .  He was last seen in office on 09/20/2022 and was doing well.  He denied symptoms concerning for angina.   He presents today for follow-up. Since his  last visit   1. CAD: S/p NSTEMI, DES-D1 in 08/2022. EF was normal. Stable with no anginal symptoms. No indication for ischemic evaluation.  He has noted some mild shortness of breath after he takes his Brilinta , this resolves with caffeine.  I advised him to notify us  if this becomes more bothersome.  Continue aspirin , Brilinta , amlodipine , losartan , metoprolol , and Lipitor.   2. Hypertension: BP well controlled. Continue current antihypertensive regimen.    3. Hyperlipidemia: LDL was 207 in 08/2022.  He was recently started on Lipitor 80 mg daily.  Will repeat fasting lipid panel, LFTs in 6 to 8 weeks.  If LDL remains elevated above goal, consider addition of Zetia, though he will likely need referral to lipid clinic Pharm.D. for consideration of PCSK9 inhibitor.   Continue Lipitor.   4. Elevated LFTs:  Mildly elevated on most recent labs.  Repeat LFTs pending as above.   5. Tobacco use: Continues to vape.  Full cessation advised.   6. Disposition: Follow-up in   Home Medications    Current Outpatient Medications  Medication Sig Dispense Refill   aspirin  EC 81 MG tablet Take 1 tablet (81 mg total) by mouth daily. Swallow whole. 90 tablet 3   atorvastatin  (LIPITOR) 80 MG tablet Take 1 tablet (80 mg total) by mouth daily. 90 tablet 3   BRILINTA  90 MG TABS tablet Take 90 mg by mouth 2 (two) times daily.     busPIRone  (BUSPAR ) 10 MG tablet TAKE 1 TABLET BY MOUTH TWICE A DAY 60 tablet 2   cyanocobalamin  (VITAMIN B12) 1000 MCG tablet Take 1 tablet (1,000 mcg total) by mouth daily. 90 tablet 1   FLUoxetine  (PROZAC ) 40 MG capsule TAKE 1 CAPSULE (40 MG TOTAL) BY MOUTH DAILY. 90 capsule 0   folic acid  (FOLVITE ) 1 MG tablet Take 1 tablet (1 mg total) by mouth daily. 90 tablet 1   LORazepam  (ATIVAN ) 0.5 MG tablet TAKE 1 TABLET BY MOUTH TWICE A DAY AS NEEDED FOR ANXIETY 30 tablet 0   naltrexone  (DEPADE) 50 MG tablet Take 1 tablet (50 mg total) by mouth daily. 90 tablet 3   nitroGLYCERIN  (NITROSTAT ) 0.4 MG SL tablet Place 1 tablet (0.4 mg total) under the tongue every 5 (five) minutes x 3 doses as needed for chest pain. 25 tablet 3   ondansetron  (ZOFRAN -ODT) 4 MG disintegrating tablet Take 1 tablet (4 mg total) by mouth every 8 (eight) hours as needed for nausea or vomiting. (Patient not taking: Reported on 10/20/2023) 20 tablet 0   Vitamin D , Ergocalciferol , (DRISDOL ) 1.25 MG (50000 UNIT) CAPS capsule X 24 weeks 24 capsule 0   No current facility-administered medications for this visit.     Review of Systems    ***.  All other systems reviewed and are otherwise negative except as noted above.    Physical Exam    VS:  There were no vitals taken for this visit. , BMI There is no height or weight on file to calculate BMI.     GEN: Well nourished, well  developed, in no acute distress. HEENT: normal. Neck: Supple, no JVD, carotid bruits, or masses. Cardiac: RRR, no murmurs, rubs, or gallops. No clubbing, cyanosis, edema.  Radials/DP/PT 2+ and equal bilaterally.  Respiratory:  Respirations regular and unlabored, clear to auscultation bilaterally. GI: Soft, nontender, nondistended, BS + x 4. MS: no deformity or atrophy. Skin: warm and dry, no rash. Neuro:  Strength and sensation are intact. Psych: Normal affect.  Accessory Clinical Findings    ECG personally reviewed by me today -    - no acute changes.   Lab Results  Component Value Date   WBC 3.2 (L) 10/20/2023   HGB 12.2 (L) 10/20/2023   HCT 36.7 (L) 10/20/2023   MCV 109.6 (H) 10/20/2023   PLT 163.0 10/20/2023   Lab Results  Component Value Date   CREATININE 0.67 10/20/2023   BUN 9 10/20/2023   NA 139 10/20/2023   K 3.8 10/20/2023   CL 102 10/20/2023   CO2 30 10/20/2023   Lab Results  Component Value Date   ALT 39 10/20/2023   AST 64 (H) 10/20/2023   ALKPHOS 207 (H) 10/20/2023   BILITOT 0.7 10/20/2023   Lab Results  Component Value Date   CHOL 301 (H) 09/10/2022   HDL 78 09/10/2022   LDLCALC 207 (  H) 09/10/2022   TRIG 82 09/10/2022   CHOLHDL 3.9 09/10/2022    Lab Results  Component Value Date   HGBA1C 5.4 09/10/2022    Assessment & Plan    1.  ***  No BP recorded.  {Refresh Note OR Click here to enter BP  :1}***   Jude Norton, NP 12/01/2023, 5:44 AM

## 2023-12-02 NOTE — Telephone Encounter (Addendum)
 Spoke to wife and they rescheduled for 7-8 at 10 due to having to reschedule cardiology appointment and instructions sent to them by mail. Will call patient once it gets closer

## 2023-12-02 NOTE — Telephone Encounter (Signed)
 LVM and my chart message

## 2023-12-09 NOTE — Addendum Note (Signed)
 Addended by: Linette Rias E on: 12/09/2023 01:42 PM   Modules accepted: Orders

## 2023-12-12 ENCOUNTER — Encounter: Admitting: Gastroenterology

## 2023-12-16 ENCOUNTER — Encounter: Admitting: Internal Medicine

## 2023-12-25 ENCOUNTER — Observation Stay (HOSPITAL_COMMUNITY): Payer: Self-pay

## 2023-12-25 ENCOUNTER — Telehealth: Payer: Self-pay | Admitting: Gastroenterology

## 2023-12-25 ENCOUNTER — Inpatient Hospital Stay (HOSPITAL_COMMUNITY)
Admission: EM | Admit: 2023-12-25 | Discharge: 2024-01-07 | DRG: 641 | Disposition: A | Discharge status: Skilled Nursing Facility | Payer: Self-pay | Attending: Internal Medicine | Admitting: Internal Medicine

## 2023-12-25 ENCOUNTER — Emergency Department (HOSPITAL_COMMUNITY): Payer: Self-pay

## 2023-12-25 ENCOUNTER — Other Ambulatory Visit: Payer: Self-pay

## 2023-12-25 ENCOUNTER — Encounter (HOSPITAL_COMMUNITY): Payer: Self-pay

## 2023-12-25 DIAGNOSIS — Z955 Presence of coronary angioplasty implant and graft: Secondary | ICD-10-CM | POA: Diagnosis not present

## 2023-12-25 DIAGNOSIS — R110 Nausea: Secondary | ICD-10-CM | POA: Diagnosis not present

## 2023-12-25 DIAGNOSIS — F419 Anxiety disorder, unspecified: Secondary | ICD-10-CM | POA: Diagnosis present

## 2023-12-25 DIAGNOSIS — E860 Dehydration: Secondary | ICD-10-CM | POA: Diagnosis not present

## 2023-12-25 DIAGNOSIS — F411 Generalized anxiety disorder: Secondary | ICD-10-CM | POA: Diagnosis not present

## 2023-12-25 DIAGNOSIS — E871 Hypo-osmolality and hyponatremia: Secondary | ICD-10-CM | POA: Diagnosis not present

## 2023-12-25 DIAGNOSIS — Y900 Blood alcohol level of less than 20 mg/100 ml: Secondary | ICD-10-CM | POA: Diagnosis not present

## 2023-12-25 DIAGNOSIS — F32A Depression, unspecified: Secondary | ICD-10-CM | POA: Diagnosis present

## 2023-12-25 DIAGNOSIS — R1313 Dysphagia, pharyngeal phase: Secondary | ICD-10-CM | POA: Diagnosis not present

## 2023-12-25 DIAGNOSIS — F10231 Alcohol dependence with withdrawal delirium: Secondary | ICD-10-CM | POA: Diagnosis not present

## 2023-12-25 DIAGNOSIS — F1010 Alcohol abuse, uncomplicated: Secondary | ICD-10-CM

## 2023-12-25 DIAGNOSIS — I252 Old myocardial infarction: Secondary | ICD-10-CM

## 2023-12-25 DIAGNOSIS — E512 Wernicke's encephalopathy: Principal | ICD-10-CM | POA: Diagnosis present

## 2023-12-25 DIAGNOSIS — F410 Panic disorder [episodic paroxysmal anxiety] without agoraphobia: Secondary | ICD-10-CM | POA: Diagnosis not present

## 2023-12-25 DIAGNOSIS — R000 Tachycardia, unspecified: Secondary | ICD-10-CM | POA: Diagnosis not present

## 2023-12-25 DIAGNOSIS — Z7401 Bed confinement status: Secondary | ICD-10-CM | POA: Diagnosis not present

## 2023-12-25 DIAGNOSIS — R5383 Other fatigue: Secondary | ICD-10-CM | POA: Diagnosis present

## 2023-12-25 DIAGNOSIS — R319 Hematuria, unspecified: Secondary | ICD-10-CM | POA: Diagnosis not present

## 2023-12-25 DIAGNOSIS — K7010 Alcoholic hepatitis without ascites: Secondary | ICD-10-CM | POA: Diagnosis not present

## 2023-12-25 DIAGNOSIS — Z7902 Long term (current) use of antithrombotics/antiplatelets: Secondary | ICD-10-CM | POA: Diagnosis not present

## 2023-12-25 DIAGNOSIS — F10929 Alcohol use, unspecified with intoxication, unspecified: Secondary | ICD-10-CM | POA: Diagnosis not present

## 2023-12-25 DIAGNOSIS — Z8679 Personal history of other diseases of the circulatory system: Secondary | ICD-10-CM | POA: Diagnosis not present

## 2023-12-25 DIAGNOSIS — R0902 Hypoxemia: Secondary | ICD-10-CM | POA: Diagnosis not present

## 2023-12-25 DIAGNOSIS — R4182 Altered mental status, unspecified: Secondary | ICD-10-CM | POA: Diagnosis not present

## 2023-12-25 DIAGNOSIS — I10 Essential (primary) hypertension: Secondary | ICD-10-CM | POA: Diagnosis present

## 2023-12-25 DIAGNOSIS — E8770 Fluid overload, unspecified: Secondary | ICD-10-CM | POA: Diagnosis present

## 2023-12-25 DIAGNOSIS — M6281 Muscle weakness (generalized): Secondary | ICD-10-CM | POA: Diagnosis not present

## 2023-12-25 DIAGNOSIS — E876 Hypokalemia: Secondary | ICD-10-CM | POA: Diagnosis not present

## 2023-12-25 DIAGNOSIS — R0602 Shortness of breath: Secondary | ICD-10-CM | POA: Diagnosis not present

## 2023-12-25 DIAGNOSIS — Z7982 Long term (current) use of aspirin: Secondary | ICD-10-CM | POA: Diagnosis not present

## 2023-12-25 DIAGNOSIS — G9340 Encephalopathy, unspecified: Secondary | ICD-10-CM | POA: Diagnosis not present

## 2023-12-25 DIAGNOSIS — I2489 Other forms of acute ischemic heart disease: Secondary | ICD-10-CM | POA: Diagnosis present

## 2023-12-25 DIAGNOSIS — E785 Hyperlipidemia, unspecified: Secondary | ICD-10-CM | POA: Diagnosis not present

## 2023-12-25 DIAGNOSIS — N179 Acute kidney failure, unspecified: Secondary | ICD-10-CM | POA: Diagnosis not present

## 2023-12-25 DIAGNOSIS — R569 Unspecified convulsions: Secondary | ICD-10-CM | POA: Diagnosis not present

## 2023-12-25 DIAGNOSIS — F17210 Nicotine dependence, cigarettes, uncomplicated: Secondary | ICD-10-CM | POA: Diagnosis not present

## 2023-12-25 DIAGNOSIS — I2510 Atherosclerotic heart disease of native coronary artery without angina pectoris: Secondary | ICD-10-CM | POA: Diagnosis not present

## 2023-12-25 DIAGNOSIS — N320 Bladder-neck obstruction: Secondary | ICD-10-CM | POA: Diagnosis present

## 2023-12-25 DIAGNOSIS — R0989 Other specified symptoms and signs involving the circulatory and respiratory systems: Secondary | ICD-10-CM | POA: Diagnosis not present

## 2023-12-25 DIAGNOSIS — R918 Other nonspecific abnormal finding of lung field: Secondary | ICD-10-CM | POA: Diagnosis not present

## 2023-12-25 DIAGNOSIS — R410 Disorientation, unspecified: Secondary | ICD-10-CM | POA: Diagnosis not present

## 2023-12-25 DIAGNOSIS — J9811 Atelectasis: Secondary | ICD-10-CM | POA: Diagnosis not present

## 2023-12-25 DIAGNOSIS — K7682 Hepatic encephalopathy: Secondary | ICD-10-CM | POA: Diagnosis present

## 2023-12-25 DIAGNOSIS — I6782 Cerebral ischemia: Secondary | ICD-10-CM | POA: Diagnosis not present

## 2023-12-25 LAB — COMPREHENSIVE METABOLIC PANEL WITH GFR
ALT: 55 U/L — ABNORMAL HIGH (ref 0–44)
AST: 142 U/L — ABNORMAL HIGH (ref 15–41)
Albumin: 1.8 g/dL — ABNORMAL LOW (ref 3.5–5.0)
Alkaline Phosphatase: 94 U/L (ref 38–126)
Anion gap: 15 (ref 5–15)
BUN: 9 mg/dL (ref 6–20)
CO2: 35 mmol/L — ABNORMAL HIGH (ref 22–32)
Calcium: 7.9 mg/dL — ABNORMAL LOW (ref 8.9–10.3)
Chloride: 81 mmol/L — ABNORMAL LOW (ref 98–111)
Creatinine, Ser: 0.93 mg/dL (ref 0.61–1.24)
GFR, Estimated: >60 mL/min (ref >60)
Glucose, Bld: 118 mg/dL — ABNORMAL HIGH (ref 70–99)
Potassium: 2.8 mmol/L — ABNORMAL LOW (ref 3.5–5.1)
Sodium: 131 mmol/L — ABNORMAL LOW (ref 135–145)
Total Bilirubin: 2.5 mg/dL — ABNORMAL HIGH (ref 0.0–1.2)
Total Protein: 6.5 g/dL (ref 6.5–8.1)

## 2023-12-25 LAB — ETHANOL: Alcohol, Ethyl (B): <15 mg/dL (ref <15)

## 2023-12-25 LAB — CBC WITH DIFFERENTIAL/PLATELET
Abs Immature Granulocytes: 0.02 10*3/uL (ref 0.00–0.07)
Basophils Absolute: 0 10*3/uL (ref 0.0–0.1)
Basophils Relative: 0 %
Eosinophils Absolute: 0.1 10*3/uL (ref 0.0–0.5)
Eosinophils Relative: 1 %
HCT: 39.9 % (ref 39.0–52.0)
Hemoglobin: 13.9 g/dL (ref 13.0–17.0)
Immature Granulocytes: 0 %
Lymphocytes Relative: 16 %
Lymphs Abs: 1.3 10*3/uL (ref 0.7–4.0)
MCH: 34.2 pg — ABNORMAL HIGH (ref 26.0–34.0)
MCHC: 34.8 g/dL (ref 30.0–36.0)
MCV: 98 fL (ref 80.0–100.0)
Monocytes Absolute: 0.7 10*3/uL (ref 0.1–1.0)
Monocytes Relative: 8 %
Neutro Abs: 6.2 10*3/uL (ref 1.7–7.7)
Neutrophils Relative %: 75 %
Platelets: 161 10*3/uL (ref 150–400)
RBC: 4.07 MIL/uL — ABNORMAL LOW (ref 4.22–5.81)
RDW: 18.3 % — ABNORMAL HIGH (ref 11.5–15.5)
WBC: 8.3 10*3/uL (ref 4.0–10.5)
nRBC: 0.5 % — ABNORMAL HIGH (ref 0.0–0.2)

## 2023-12-25 LAB — PROTIME-INR
INR: 1.2 (ref 0.8–1.2)
Prothrombin Time: 15.4 s — ABNORMAL HIGH (ref 11.4–15.2)

## 2023-12-25 LAB — BRAIN NATRIURETIC PEPTIDE: B Natriuretic Peptide: 58.9 pg/mL (ref 0.0–100.0)

## 2023-12-25 LAB — T4, FREE: Free T4: 0.76 ng/dL (ref 0.61–1.12)

## 2023-12-25 LAB — TSH: TSH: 1.453 u[IU]/mL (ref 0.350–4.500)

## 2023-12-25 LAB — TROPONIN I (HIGH SENSITIVITY): Troponin I (High Sensitivity): 66 ng/L — ABNORMAL HIGH (ref <18)

## 2023-12-25 LAB — MAGNESIUM: Magnesium: 2.2 mg/dL (ref 1.7–2.4)

## 2023-12-25 LAB — AMMONIA: Ammonia: 49 umol/L — ABNORMAL HIGH (ref 9–35)

## 2023-12-25 LAB — FOLATE: Folate: 4.5 ng/mL — ABNORMAL LOW (ref >5.9)

## 2023-12-25 MED ORDER — LORAZEPAM 2 MG/ML IJ SOLN: 1.0000 mg | INTRAMUSCULAR | Status: AC | PRN

## 2023-12-25 MED ORDER — THIAMINE HCL 100 MG/ML IJ SOLN
100.0000 mg | Freq: Once | INTRAMUSCULAR | Status: AC
Start: 2023-12-25 — End: 2023-12-25
  Administered 2023-12-25: 100 mg via INTRAVENOUS
  Filled 2023-12-25: qty 2

## 2023-12-25 MED ORDER — SODIUM CHLORIDE 0.9% FLUSH: 3.0000 mL | INTRAVENOUS | Status: DC | PRN

## 2023-12-25 MED ORDER — LACTULOSE ENEMA
300.0000 mL | Freq: Once | ORAL | Status: DC
  Filled 2023-12-25 (×2): qty 300

## 2023-12-25 MED ORDER — SODIUM CHLORIDE 0.9 % IV SOLN: Freq: Once | INTRAVENOUS | Status: AC

## 2023-12-25 MED ORDER — NICOTINE 21 MG/24HR TD PT24
21.0000 mg | MEDICATED_PATCH | Freq: Every day | TRANSDERMAL | Status: DC
  Administered 2023-12-25 – 2024-01-07 (×12): 21 mg via TRANSDERMAL
  Filled 2023-12-25 (×14): qty 1

## 2023-12-25 MED ORDER — SODIUM CHLORIDE 0.9% FLUSH: 3.0000 mL | Freq: Two times a day (BID) | INTRAVENOUS | Status: DC

## 2023-12-25 MED ORDER — SODIUM CHLORIDE 0.9% FLUSH
3.0000 mL | Freq: Two times a day (BID) | INTRAVENOUS | Status: DC
Start: 2023-12-25 — End: 2023-12-27

## 2023-12-25 MED ORDER — FOLIC ACID 1 MG PO TABS
1.0000 mg | ORAL_TABLET | Freq: Every day | ORAL | Status: DC
  Administered 2023-12-25 – 2024-01-07 (×12): 1 mg via ORAL
  Filled 2023-12-25 (×13): qty 1

## 2023-12-25 MED ORDER — LORAZEPAM 1 MG PO TABS
1.0000 mg | ORAL_TABLET | ORAL | Status: AC | PRN
  Administered 2023-12-25: 1 mg via ORAL
  Filled 2023-12-25: qty 1

## 2023-12-25 MED ORDER — ADULT MULTIVITAMIN W/MINERALS CH
1.0000 | ORAL_TABLET | Freq: Every day | ORAL | Status: DC
  Administered 2023-12-25 – 2024-01-07 (×12): 1 via ORAL
  Filled 2023-12-25 (×14): qty 1

## 2023-12-25 MED ORDER — MAGNESIUM SULFATE 2 GM/50ML IV SOLN
2.0000 g | Freq: Once | INTRAVENOUS | Status: AC
Start: 2023-12-25 — End: 2023-12-25
  Administered 2023-12-25: 2 g via INTRAVENOUS
  Filled 2023-12-25: qty 50

## 2023-12-25 MED ORDER — POTASSIUM CHLORIDE 10 MEQ/100ML IV SOLN
10.0000 meq | INTRAVENOUS | Status: AC
  Administered 2023-12-25 (×3): 10 meq via INTRAVENOUS
  Filled 2023-12-25 (×3): qty 100

## 2023-12-25 NOTE — Telephone Encounter (Signed)
 Inbound call from patient's wife, she states she believes his liver disease is affecting his brain and neurological functions. She would like to know if this is a possibility or if he is acting weird for another reason.

## 2023-12-25 NOTE — ED Provider Notes (Signed)
 [place] EMERGENCY DEPARTMENT AT [facility] Provider Note   CSN: [id number] Arrival date & time: 12/25/23  1324     Patient presents with: Alcohol Problem   Jeffrey Hodges is a 58 y.o. male.   Jeffrey Hodges is a 58 y.o. male with a history of alcoholic cirrhosis, hypertension, hyperlipidemia, coronary artery disease, who presents to the emergency department via EMS for altered mental status.  Per EMS patient laying in bed for the past 4 days, reportedly lives at home with his father and called out due to confusion.  Chart review revealed phone calls from patient's wife to GI office due to increasing confusion with nonsensical speech, unable to answer questions.  They were concerned for potential hepatic encephalopathy in the setting of cirrhosis and recommended transport to the ED.  When the patient is asked why he is here he reports I do not know if these people just pulled me out of my bed.  Patient does report drinking but is nonspecific about the amount he drinks and reports last drinking yesterday.  Patient's father is at bedside and reports increasing confusion and longtime alcohol use but is not sure of other issues.  Unable to reach patient's wife as she is at a child's graduation ceremony  The history is provided by the patient, the EMS personnel and a relative.  Alcohol Problem       Prior to Admission medications   Medication Sig Start Date End Date Taking? Authorizing Provider  aspirin  EC 81 MG tablet Take 1 tablet (81 mg total) by mouth daily. Swallow whole. 09/20/22   Daneen Damien BROCKS, NP  atorvastatin  (LIPITOR) 80 MG tablet Take 1 tablet (80 mg total) by mouth daily. 09/20/22   Daneen Damien BROCKS, NP  BRILINTA  90 MG TABS tablet Take 90 mg by mouth 2 (two) times daily. 10/05/22   [provider]  busPIRone  (BUSPAR ) 10 MG tablet TAKE 1 TABLET BY MOUTH TWICE A DAY 04/21/23   [name], NP  cyanocobalamin  (VITAMIN B12) 1000 MCG tablet Take 1 tablet (1,000 mcg  total) by mouth daily. 10/27/23   Stacia Glendia BRAVO, MD  FLUoxetine  (PROZAC ) 40 MG capsule TAKE 1 CAPSULE (40 MG TOTAL) BY MOUTH DAILY. 10/09/23   [name], NP  folic acid  (FOLVITE ) 1 MG tablet Take 1 tablet (1 mg total) by mouth daily. 10/27/23   Stacia Glendia BRAVO, MD  LORazepam  (ATIVAN ) 0.5 MG tablet TAKE 1 TABLET BY MOUTH TWICE A DAY AS NEEDED FOR ANXIETY 08/29/23   [name], NP  naltrexone  (DEPADE) 50 MG tablet Take 1 tablet (50 mg total) by mouth daily. 10/27/23   Stacia Glendia BRAVO, MD  nitroGLYCERIN  (NITROSTAT ) 0.4 MG SL tablet Place 1 tablet (0.4 mg total) under the tongue every 5 (five) minutes x 3 doses as needed for chest pain. 09/20/22   Daneen Damien BROCKS, NP  ondansetron  (ZOFRAN -ODT) 4 MG disintegrating tablet Take 1 tablet (4 mg total) by mouth every 8 (eight) hours as needed for nausea or vomiting. Patient not taking: Reported on 10/20/2023 10/01/22   [name], NP  Vitamin D , Ergocalciferol , (DRISDOL ) 1.25 MG (50000 UNIT) CAPS capsule X 24 weeks 10/27/23   Stacia Glendia BRAVO, MD    Allergies: Patient has no known allergies.    Review of Systems  Unable to perform ROS: Mental status change    Updated Vital Signs BP (!) 156/100 (BP Location: Left Arm)   Pulse (!) 107   Temp 98.2  F (36.8 C) (Oral)   Resp 18   Ht 6' (1.829 m)   Wt 90.7 kg   SpO2 96%   BMI 27.12 kg/m   Physical Exam Vitals and nursing note reviewed.  Constitutional:      General: He is not in acute distress.    Appearance: He is well-developed. He is not diaphoretic.     Comments: Patient somewhat somnolent but arouses to voice  HENT:     Head: Normocephalic and atraumatic.     Mouth/Throat:     Mouth: Mucous membranes are moist.     Pharynx: Oropharynx is clear.   Eyes:     General:        Right eye: No discharge.        Left eye: No discharge.     Pupils: Pupils are equal, round, and reactive to light.    Cardiovascular:     Rate and Rhythm: Normal rate and regular rhythm.      Pulses: Normal pulses.     Heart sounds: Normal heart sounds.  Pulmonary:     Effort: Pulmonary effort is normal. No respiratory distress.     Breath sounds: Normal breath sounds. No wheezing or rales.     Comments: Respirations equal and unlabored, patient able to speak in full sentences, lungs clear to auscultation bilaterally  Abdominal:     General: Bowel sounds are normal. There is no distension.     Palpations: Abdomen is soft. There is no mass.     Tenderness: There is no abdominal tenderness. There is no guarding.     Comments: Abdomen soft, nondistended, nontender to palpation in all quadrants without guarding or peritoneal signs, no fluid wave present   Musculoskeletal:        General: No deformity.     Cervical back: Neck supple.   Skin:    General: Skin is warm and dry.     Capillary Refill: Capillary refill takes less than 2 seconds.   Neurological:     Mental Status: He is oriented to person, place, and time.     Coordination: Coordination normal.     Comments: Speech is clear, able to follow commands CN III-XII intact Normal strength in upper and lower extremities bilaterally including dorsiflexion and plantar flexion, strong and equal grip strength Sensation normal to light and sharp touch Moves extremities without ataxia, coordination intact  Psychiatric:        Attention and Perception: He is inattentive.        Mood and Affect: Mood normal. Affect is flat.        Speech: He is noncommunicative.        Behavior: Behavior normal.     (all labs ordered are listed, but only abnormal results are displayed) Labs Reviewed  COMPREHENSIVE METABOLIC PANEL WITH GFR - Abnormal; Notable for the following components:      Result Value   Sodium 131 (*)    Potassium 2.8 (*)    Chloride 81 (*)    CO2 35 (*)    Glucose, Bld 118 (*)    Calcium  7.9 (*)    Albumin  1.8 (*)    AST 142 (*)    ALT 55 (*)    Total Bilirubin 2.5 (*)    All other components within normal  limits  CBC WITH DIFFERENTIAL/PLATELET - Abnormal; Notable for the following components:   RBC 4.07 (*)    MCH 34.2 (*)    RDW 18.3 (*)  nRBC 0.5 (*)    All other components within normal limits  PROTIME-INR - Abnormal; Notable for the following components:   Prothrombin Time 15.4 (*)    All other components within normal limits  AMMONIA - Abnormal; Notable for the following components:   Ammonia 49 (*)    All other components within normal limits  COMPREHENSIVE METABOLIC PANEL WITH GFR - Abnormal; Notable for the following components:   Sodium 132 (*)    Potassium 2.5 (*)    Chloride 85 (*)    CO2 35 (*)    Calcium  7.7 (*)    Total Protein 6.1 (*)    Albumin  1.8 (*)    AST 116 (*)    ALT 53 (*)    Total Bilirubin 2.0 (*)    All other components within normal limits  CBC - Abnormal; Notable for the following components:   RBC 3.78 (*)    HCT 37.8 (*)    MCH 34.4 (*)    RDW 18.2 (*)    All other components within normal limits  FOLATE - Abnormal; Notable for the following components:   Folate 4.5 (*)    All other components within normal limits  URINALYSIS, ROUTINE W REFLEX MICROSCOPIC - Abnormal; Notable for the following components:   Color, Urine AMBER (*)    Specific Gravity, Urine 1.040 (*)    Bilirubin Urine SMALL (*)    Ketones, ur 5 (*)    Protein, ur 30 (*)    All other components within normal limits  RAPID URINE DRUG SCREEN, HOSP PERFORMED - Abnormal; Notable for the following components:   Benzodiazepines POSITIVE (*)    Tetrahydrocannabinol POSITIVE (*)    All other components within normal limits  CBC WITH DIFFERENTIAL/PLATELET - Abnormal; Notable for the following components:   WBC 11.1 (*)    RBC 3.58 (*)    Hemoglobin 12.0 (*)    HCT 36.0 (*)    MCV 100.6 (*)    RDW 18.6 (*)    Platelets 149 (*)    Neutro Abs 8.6 (*)    All other components within normal limits  PHOSPHORUS - Abnormal; Notable for the following components:   Phosphorus 1.9 (*)     All other components within normal limits  C-REACTIVE PROTEIN - Abnormal; Notable for the following components:   CRP 3.2 (*)    All other components within normal limits  BRAIN NATRIURETIC PEPTIDE - Abnormal; Notable for the following components:   B Natriuretic Peptide 122.9 (*)    All other components within normal limits  COMPREHENSIVE METABOLIC PANEL WITH GFR - Abnormal; Notable for the following components:   Potassium 3.0 (*)    Chloride 93 (*)    Glucose, Bld 113 (*)    Calcium  8.1 (*)    Total Protein 6.2 (*)    Albumin  1.7 (*)    AST 99 (*)    ALT 50 (*)    Total Bilirubin 1.7 (*)    All other components within normal limits  CBC WITH DIFFERENTIAL/PLATELET - Abnormal; Notable for the following components:   RBC 3.32 (*)    Hemoglobin 11.6 (*)    HCT 35.0 (*)    MCV 105.4 (*)    MCH 34.9 (*)    RDW 19.3 (*)    Platelets 136 (*)    All other components within normal limits  COMPREHENSIVE METABOLIC PANEL WITH GFR - Abnormal; Notable for the following components:   Potassium 3.4 (*)  Glucose, Bld 117 (*)    Creatinine, Ser 0.59 (*)    Calcium  7.8 (*)    Total Protein 5.7 (*)    Albumin  1.5 (*)    AST 79 (*)    All other components within normal limits  BRAIN NATRIURETIC PEPTIDE - Abnormal; Notable for the following components:   B Natriuretic Peptide 663.6 (*)    All other components within normal limits  C-REACTIVE PROTEIN - Abnormal; Notable for the following components:   CRP 2.7 (*)    All other components within normal limits  PROTIME-INR - Abnormal; Notable for the following components:   Prothrombin Time 15.8 (*)    All other components within normal limits  CBC WITH DIFFERENTIAL/PLATELET - Abnormal; Notable for the following components:   RBC 3.32 (*)    Hemoglobin 11.5 (*)    HCT 33.2 (*)    MCH 34.6 (*)    RDW 19.0 (*)    Platelets 131 (*)    All other components within normal limits  COMPREHENSIVE METABOLIC PANEL WITH GFR - Abnormal; Notable  for the following components:   Potassium 3.2 (*)    Glucose, Bld 116 (*)    Calcium  8.2 (*)    Total Protein 6.4 (*)    Albumin  1.7 (*)    AST 95 (*)    ALT 55 (*)    Total Bilirubin 1.3 (*)    All other components within normal limits  BRAIN NATRIURETIC PEPTIDE - Abnormal; Notable for the following components:   B Natriuretic Peptide 167.3 (*)    All other components within normal limits  PHOSPHORUS - Abnormal; Notable for the following components:   Phosphorus 2.3 (*)    All other components within normal limits  C-REACTIVE PROTEIN - Abnormal; Notable for the following components:   CRP 2.9 (*)    All other components within normal limits  PROTIME-INR - Abnormal; Notable for the following components:   Prothrombin Time 16.6 (*)    INR 1.3 (*)    All other components within normal limits  CBC WITH DIFFERENTIAL/PLATELET - Abnormal; Notable for the following components:   RBC 3.20 (*)    Hemoglobin 11.1 (*)    HCT 32.4 (*)    MCV 101.3 (*)    MCH 34.7 (*)    RDW 19.0 (*)    Platelets 146 (*)    All other components within normal limits  COMPREHENSIVE METABOLIC PANEL WITH GFR - Abnormal; Notable for the following components:   Glucose, Bld 110 (*)    Calcium  7.8 (*)    Total Protein 5.8 (*)    Albumin  1.5 (*)    AST 75 (*)    ALT 47 (*)    All other components within normal limits  BRAIN NATRIURETIC PEPTIDE - Abnormal; Notable for the following components:   B Natriuretic Peptide 233.2 (*)    All other components within normal limits  C-REACTIVE PROTEIN - Abnormal; Notable for the following components:   CRP 3.4 (*)    All other components within normal limits  CK - Abnormal; Notable for the following components:   Total CK 805 (*)    All other components within normal limits  URINALYSIS, ROUTINE W REFLEX MICROSCOPIC - Abnormal; Notable for the following components:   Color, Urine AMBER (*)    APPearance CLOUDY (*)    Specific Gravity, Urine 1.036 (*)    Glucose,  UA 50 (*)    Hgb urine dipstick LARGE (*)    Protein,  ur 100 (*)    Bacteria, UA RARE (*)    All other components within normal limits  CBC WITH DIFFERENTIAL/PLATELET - Abnormal; Notable for the following components:   RBC 3.13 (*)    Hemoglobin 10.7 (*)    HCT 31.2 (*)    MCH 34.2 (*)    RDW 18.9 (*)    Platelets 124 (*)    All other components within normal limits  COMPREHENSIVE METABOLIC PANEL WITH GFR - Abnormal; Notable for the following components:   Sodium 131 (*)    Glucose, Bld 103 (*)    Calcium  7.4 (*)    Total Protein 5.8 (*)    Albumin  <1.5 (*)    AST 67 (*)    ALT 46 (*)    All other components within normal limits  C-REACTIVE PROTEIN - Abnormal; Notable for the following components:   CRP 3.0 (*)    All other components within normal limits  CK - Abnormal; Notable for the following components:   Total CK 506 (*)    All other components within normal limits  TROPONIN I (HIGH SENSITIVITY) - Abnormal; Notable for the following components:   Troponin I (High Sensitivity) 66 (*)    All other components within normal limits  TROPONIN I (HIGH SENSITIVITY) - Abnormal; Notable for the following components:   Troponin I (High Sensitivity) 72 (*)    All other components within normal limits  ETHANOL  MAGNESIUM   VITAMIN B1  T4, FREE  TSH  BRAIN NATRIURETIC PEPTIDE  VITAMIN B12  MAGNESIUM   AMMONIA  MAGNESIUM   PROCALCITONIN  RPR  VITAMIN B1  MAGNESIUM   PHOSPHORUS  PROCALCITONIN  MAGNESIUM   PROCALCITONIN  MAGNESIUM   PHOSPHORUS  PROCALCITONIN  MYOGLOBIN, URINE  URIC ACID  OSMOLALITY  OSMOLALITY, URINE  SODIUM, URINE, RANDOM  CREATININE, URINE, RANDOM  UREA  NITROGEN, URINE  MAGNESIUM   BRAIN NATRIURETIC PEPTIDE  PHOSPHORUS  PROCALCITONIN  CBC WITH DIFFERENTIAL/PLATELET  COMPREHENSIVE METABOLIC PANEL WITH GFR  MAGNESIUM   PHOSPHORUS  TYPE AND SCREEN  ABO/RH    EKG: None  Radiology: No results found.   Procedures   Medications Ordered in  the ED - No data to display  Clinical Course as of 12/31/23 2253  Thu Dec 25, 2023  1528 Here with letharycy and AMS Needs to come into the hospital for admission' For confusion and electrolyte disturbance FTT. Patient unwilling to give hx. Longstanding hx of alcohol abuse and known cirrhosis.  [AH]  1529 Calcium (!): 7.9 [AH]  1529 AST(!): 142 [AH]  1529 ALT(!): 55 [AH]  1529 Total Bilirubin(!): 2.5 [AH]  1622 I visualized and interpreted the patient's head CT, no acute findings. [AH]  1751 Case discussed with Dr. Tobie for admission [AH]    Clinical Course User Index [AH] Arloa Chroman, PA-C                                 Medical Decision Making Amount and/or Complexity of Data Reviewed Labs: ordered. Decision-making details documented in ED Course. Radiology: ordered.  Risk Prescription drug management. Decision regarding hospitalization.   58 year old male presents with encephalopathy, family reports increasing confusion with decreased activity and intake over the past few days.  On arrival patient is somewhat somnolent and has difficulty answering questions but can follow commands.  Vital stable on arrival.  Laboratory evaluation with multiple electrolyte derangements with hypokalemia, hypocalcemia, transaminitis and T. bili of 2.5, normal hemoglobin and platelets, ammonia level pending,  ethanol level negative.  Thiamine  level pending as well.  INR fortunately not elevated in the setting of cirrhosis.  CT head pending in the setting of altered mental status.  IV magnesium  and potassium ordered, patient with encephalopathy and multiple electrolyte derangements.  Pending additional labs anticipate admission to the hospital.  Care signed out to PA Jeffrey Hodges who will follow-up on pending studies and admit patient     Final diagnoses:  Encephalopathy acute  Alcohol abuse    ED Discharge Orders     None          Jeffrey Hodges 12/31/23 2300     Ula Prentice SAUNDERS, MD 01/03/24 2325

## 2023-12-25 NOTE — ED Notes (Addendum)
 Patient to MRI CCMD made aware

## 2023-12-25 NOTE — ED Triage Notes (Signed)
 Pt to er via ems, per ems pt has been laying in bed for the past 4 days, states that he lives at home with his dad, states that dad called ems for failure to thrive.  Pt denies pain, states, I don't know why I am here

## 2023-12-25 NOTE — ED Provider Notes (Signed)
  Physical Exam  BP (!) 125/91 (BP Location: Right Arm)   Pulse 98   Temp 98.2 F (36.8 C) (Oral)   Resp (!) 21   Ht 6' (1.829 m)   Wt 90.7 kg   SpO2 98%   BMI 27.12 kg/m   Physical Exam  Procedures  Procedures  ED Course / MDM   Clinical Course as of 12/25/23 1753  Thu Dec 25, 2023  1528 Here with letharycy and AMS Needs to come into the hospital for admission' For confusion and electrolyte disturbance FTT. Patient unwilling to give hx. Longstanding hx of alcohol abuse and known cirrhosis.  [AH]  1529 Calcium (!): 7.9 [AH]  1529 AST(!): 142 [AH]  1529 ALT(!): 55 [AH]  1529 Total Bilirubin(!): 2.5 [AH]  1622 I visualized and interpreted the patient's head CT, no acute findings. [AH]  1751 Case discussed with Dr. Lydia Sams for admission [AH]    Clinical Course User Index [AH] Tama Fails, PA-C   Medical Decision Making Amount and/or Complexity of Data Reviewed Labs: ordered. Decision-making details documented in ED Course. Radiology: ordered.  Risk Prescription drug management. Decision regarding hospitalization.   Patient here with altered mental status Differential diagnosis includes urinary tract infection, metabolic encephalopathy including hyperammonemia, Wernicke's encephalopathy, beriberi. Patient's workup shows multiple metabolic derangements including hypokalemia hypocalcemia.  Patient does not appear volume overloaded.  He has elevated transaminase levels and a bilirubin of 2.5 consistent with his history of alcoholic cirrhosis.  CT is negative of the head. Patient will need to come into the hospital for acute encephalopathy.       Tama Fails, PA-C 12/25/23 1753    Lowery Rue, DO 12/25/23 1947

## 2023-12-25 NOTE — ED Notes (Signed)
 Talked with provider about ciwa score, agreed that pt doesn't need more ativan  at this time, will re evaluated with changed in vital signs.

## 2023-12-25 NOTE — Telephone Encounter (Signed)
 Pts wife calling, she wants to know if his liver disease may be causing him to say random things that don't make sense. She states he won't answer his phone or texts and he is more sleepy. ? Hepatic encephalopathy. Please advise.

## 2023-12-25 NOTE — Telephone Encounter (Signed)
 Spoke with pts wife, she is aware of recommendations and has already called EMS to come and take him to the ER.

## 2023-12-25 NOTE — TOC CM/SW Note (Signed)
 SW met patient at bedside, patient presented with confusion. [name] expressed concerns regarding the patient's health, cognitive changes and behaviors. Patient has been in bed for 4 days, vomiting, and failure to thrive. SW attempted to ask questions to the patient, he was not speaking in complete sentences, grandiose thoughts, and not able to answer questions. Patient admitted he does drink, but not able to explain how much he drinks. SW spoke with his wife Meng [place] [phone]) she expressed concerns, she has noticed changes in his behaviors, increase confusion, have not showered in days or ate.   .Arlee Bossard, MSW, LCSWA Transition of Care  Clinical Social Worker (ED 3-11 Mon-Fri)  [phone]

## 2023-12-25 NOTE — H&P (Signed)
 History and Physical    Patient: Jeffrey Hodges GEX:[id number] DOB: 11-Sep-1965 DOA: 12/25/2023 DOS: the patient was seen and examined on 12/25/2023 PCP: Dorothe Gaster, NP  Patient coming from: Home Chief complaint: Chief Complaint  Patient presents with   Alcohol Problem   HPI:  Jeffrey Hodges is a 58 y.o. male with past medical history  of heart disease status post stent, history of syncope, history of high risk of falls, history of heavy alcohol abuse, anemia, AKI, depression anxiety, elevated LFTs, hyperlipidemia, tobacco abuse, essential hypertension, for abnormal behavior saying random things that do not make sense patient is more sleepy than usual not answering his phone.  Patient sees GI and cardiology.  Patient drinks 2 bottles of liquor essentially every day.  Father at bedside states that he has been drinking heavily for nearly 30 years.  At his home he had bottles all over patient lives with wife and children.  ED Course: Pt in ed at bedside  is lethargic and somnolent vital stable otherwise. Vital signs in the ED were notable for the following:  Vitals:   12/25/23 1643 12/25/23 1651 12/25/23 1655 12/25/23 1822  BP: (!) 132/92 (!) 125/91 (!) 136/90 (!) 136/90  Pulse: (!) 102 98 93 92  Temp:    97.8 F (36.6 C)  Resp:  (!) 21  (!) 22  Height:      Weight:      SpO2:  98%  95%  TempSrc:    Oral  BMI (Calculated):      >>ED evaluation thus far shows: Abnormal CMP showing sodium 131 potassium 2.8 replaced, bicarb 35, glucose 118, calcium  7.9 normal magnesium of 2.2, normal kidney function at 0.93 with a GFR of more than 60.  Abnormal LFTs with albumin of 1.8 AST of 142 ALT of 55 ammonia of 49 total bili of 2.5.   CBC shows normal white count of 8.3 hemoglobin of 13.9 platelets of 161. INR of 1.2 today. Urinalysis is ordered and pending. Alcohol level is less than 15 today. Head CT - No acute intracranial abnormality, specifically, no acute hemorrhage, territorial  infarction, or intracranial mass. Most recent right upper quadrant ultrasound shows no acute abnormality, increased echotexture with fatty infiltration of the liver, small amount of ascites, gallbladder visualized with no wall thickening or gallstones common bile duct at 4.2 mm. EKG today is abnormal with sinus tach at 102, PR of 132, QTc prolonged at 573, no ST elevation or ischemic changes seen. 2 D Echo 03/2023: Left ventricular ejection fraction, by estimation, is 55 to 60% . The left ventricle has normal function. The left ventricle has no regional wall motion abnormalities. Left ventricular diastolic parameters were normal. 2. Right ventricular systolic function is normal. The right ventricular size is mildly enlarged. 3. The mitral valve is normal in structure. Trivial mitral valve regurgitation. No evidence of mitral stenosis. 4. The aortic valve is tricuspid. There is mild calcification of the aortic valve. There is mild thickening of the aortic valve. Aortic valve regurgitation is not visualized. 5. The inferior vena cava is dilated in size with > 50% respiratory variability, suggesting right atrial pressure of 8 mmHg.  >>While in the ED patient received the following: Medications  potassium chloride  10 mEq in 100 mL IVPB (10 mEq Intravenous New Bag/Given 12/25/23 1726)  LORazepam  (ATIVAN ) tablet 1-4 mg (1 mg Oral Given 12/25/23 1652)    Or  LORazepam  (ATIVAN ) injection 1-4 mg ( Intravenous See Alternative 12/25/23 1652)  folic acid  (  FOLVITE ) tablet 1 mg (1 mg Oral Given 12/25/23 1554)  multivitamin with minerals tablet 1 tablet (1 tablet Oral Given 12/25/23 1555)  magnesium sulfate IVPB 2 g 50 mL (0 g Intravenous Stopped 12/25/23 1820)  thiamine (VITAMIN B1) injection 100 mg (100 mg Intravenous Given 12/25/23 1551)  0.9 %  sodium chloride  infusion ( Intravenous New Bag/Given 12/25/23 1603)    Review of Systems  Unable to perform ROS: Mental status change   Past Medical History:  Diagnosis  Date   Anxiety    Coronary artery disease    Depression    Hyperlipidemia 09/09/2022   Hypertension    Past Surgical History:  Procedure Laterality Date   CORONARY STENT INTERVENTION N/A 09/11/2022   Procedure: CORONARY STENT INTERVENTION;  Surgeon: Kyra Phy, MD;  Location: [facility];  Service: Cardiovascular;  Laterality: N/A;   EYE SURGERY Left 1989   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 His smoking use included cigarettes. He has never been exposed to tobacco smoke. He has never used smokeless tobacco. He reports current alcohol use. He reports current drug use. Drug: Marijuana. No Known Allergies Family History  Problem Relation Age of Onset   CAD Neg Hx    CVA Neg Hx    Diabetes Neg Hx    Prior to Admission medications   Medication Sig Start Date End Date Taking? Authorizing Provider  aspirin  EC 81 MG tablet Take 1 tablet (81 mg total) by mouth daily. Swallow whole. 09/20/22   Jude Norton, NP  atorvastatin  (LIPITOR) 80 MG tablet Take 1 tablet (80 mg total) by mouth daily. 09/20/22   Jude Norton, NP  BRILINTA  90 MG TABS tablet Take 90 mg by mouth 2 (two) times daily. 10/05/22   [provider]  busPIRone  (BUSPAR ) 10 MG tablet TAKE 1 TABLET BY MOUTH TWICE A DAY 04/21/23   Dorothe Gaster, NP  cyanocobalamin  (VITAMIN B12) 1000 MCG tablet Take 1 tablet (1,000 mcg total) by mouth daily. 10/27/23   Elois Hair, MD  FLUoxetine  (PROZAC ) 40 MG capsule TAKE 1 CAPSULE (40 MG TOTAL) BY MOUTH DAILY. 10/09/23   Dorothe Gaster, NP  folic acid  (FOLVITE ) 1 MG tablet Take 1 tablet (1 mg total) by mouth daily. 10/27/23   Elois Hair, MD  LORazepam  (ATIVAN ) 0.5 MG tablet TAKE 1 TABLET BY MOUTH TWICE A  DAY AS NEEDED FOR ANXIETY 08/29/23   Dorothe Gaster, NP  naltrexone  (DEPADE) 50 MG tablet Take 1 tablet (50 mg total) by mouth daily. 10/27/23   Elois Hair, MD  nitroGLYCERIN  (NITROSTAT ) 0.4 MG SL tablet Place 1 tablet (0.4 mg total) under the tongue every 5 (five) minutes x 3 doses as needed for chest pain. 09/20/22   Jude Norton, NP  Vitamin D , Ergocalciferol , (DRISDOL ) 1.25 MG (50000 UNIT) CAPS capsule X 24 weeks Patient taking differently: Take 50,000 Units by mouth every 7 (seven) days. 10/27/23   Elois Hair, MD  Vitals:   12/25/23 1643 12/25/23 1651 12/25/23 1655 12/25/23 1822  BP: (!) 132/92 (!) 125/91 (!) 136/90 (!) 136/90  Pulse: (!) 102 98 93 92  Resp:  (!) 21  (!) 22  Temp:    97.8 F (36.6 C)  TempSrc:    Oral  SpO2:  98%  95%  Weight:      Height:       Physical Exam Vitals and nursing note reviewed.  Constitutional:      General: He is not in acute distress. HENT:     Head: Normocephalic and atraumatic.     Right Ear: Hearing normal.     Left Ear: Hearing normal.     Nose: No nasal deformity.     Mouth/Throat:     Lips: Pink.   Eyes:     General: Lids are normal.     Extraocular Movements: Extraocular movements intact.     Pupils:     Right eye: Pupil is sluggish.     Left eye: Pupil is sluggish.    Cardiovascular:     Rate and Rhythm: Normal rate and regular rhythm.     Pulses:          Dorsalis pedis pulses are 1+ on the right side and 1+ on the left side.       Posterior tibial pulses are 1+ on the right side and 1+ on the left side.     Heart sounds: Normal heart sounds.  Pulmonary:     Effort: Pulmonary effort is normal.     Breath sounds: Normal breath sounds.  Abdominal:     General: Bowel sounds are normal. There is no distension.     Palpations: Abdomen is soft. There is no mass.     Tenderness: There is no abdominal tenderness.   Musculoskeletal:      Right lower leg: No edema.     Left lower leg: No edema.   Skin:    General: Skin is warm.     Findings: Bruising present.   Neurological:     General: No focal deficit present.     Mental Status: He is alert. He is disoriented.     Cranial Nerves: Cranial nerves 2-12 are intact.   Psychiatric:        Speech: Speech normal.     Labs on Admission: I have personally reviewed following labs and imaging studies CBC: Recent Labs  Lab 12/25/23 1338  WBC 8.3  NEUTROABS 6.2  HGB 13.9  HCT 39.9  MCV 98.0  PLT 161   Basic Metabolic Panel: Recent Labs  Lab 12/25/23 1338 12/25/23 1531  NA 131*  --   K 2.8*  --   CL 81*  --   CO2 35*  --   GLUCOSE 118*  --   BUN 9  --   CREATININE 0.93  --   CALCIUM  7.9*  --   MG  --  2.2   GFR: Estimated Creatinine Clearance: 98.5 mL/min (by C-G formula based on SCr of 0.93 mg/dL). Liver Function Tests: Recent Labs  Lab 12/25/23 1338  AST 142*  ALT 55*  ALKPHOS 94  BILITOT 2.5*  PROT 6.5  ALBUMIN 1.8*   No results for input(s): LIPASE, AMYLASE in the last 168 hours. Recent Labs  Lab 12/25/23 1338  AMMONIA 49*   Coagulation Profile: Recent Labs  Lab 12/25/23 1338  INR 1.2   Cardiac Enzymes: No results for input(s): CKTOTAL, CKMB, CKMBINDEX, TROPONINI in the last  168 hours. BNP (last 3 results) No results for input(s): PROBNP in the last 8760 hours. HbA1C: No results for input(s): HGBA1C in the last 72 hours. CBG: No results for input(s): GLUCAP in the last 168 hours. Lipid Profile: No results for input(s): CHOL, HDL, LDLCALC, TRIG, CHOLHDL, LDLDIRECT in the last 72 hours. Thyroid  Function Tests: No results for input(s): TSH, T4TOTAL, FREET4, T3FREE, THYROIDAB in the last 72 hours. Anemia Panel: No results for input(s): VITAMINB12, FOLATE, FERRITIN, TIBC, IRON, RETICCTPCT in the last 72 hours. Urine analysis:    Component Value Date/Time   COLORURINE AMBER  (A) 04/02/2023 1257   APPEARANCEUR CLOUDY (A) 04/02/2023 1257   LABSPEC 1.015 04/02/2023 1257   PHURINE 5.0 04/02/2023 1257   GLUCOSEU NEGATIVE 04/02/2023 1257   HGBUR LARGE (A) 04/02/2023 1257   BILIRUBINUR NEGATIVE 04/02/2023 1257   KETONESUR NEGATIVE 04/02/2023 1257   PROTEINUR 100 (A) 04/02/2023 1257   NITRITE NEGATIVE 04/02/2023 1257   LEUKOCYTESUR NEGATIVE 04/02/2023 1257   Radiological Exams on Admission: CT Head Wo Contrast Result Date: 12/25/2023 CLINICAL DATA:  Mental status change, unknown cause EXAM: CT HEAD WITHOUT CONTRAST TECHNIQUE: Contiguous axial images were obtained from the base of the skull through the vertex without intravenous contrast. RADIATION DOSE REDUCTION: This exam was performed according to the departmental dose-optimization program which includes automated exposure control, adjustment of the mA and/or kV according to patient size and/or use of iterative reconstruction technique. COMPARISON:  April 02, 2023 FINDINGS: Brain: Proportional prominence of the ventricles and sulci, consistent with diffuse cerebral parenchymal volume loss. The ventricles otherwise maintained midline position without midline shift. Gray-white differentiation is preserved without focal attenuation abnormality.No evidence of acute territorial infarction, extra-axial fluid collection, hemorrhage, or mass lesion. The basilar cisterns are patent without downward herniation. The cerebellar hemispheres and vermis are well formed without mass lesion or focal attenuation abnormality. Vascular: No hyperdense vessel. Skull: Normal. Negative for fracture or focal lesion. Sinuses/Orbits: Significant leftward deviation of the nasal septum. The paranasal sinuses and mastoids are clear.The globes appear intact. No retrobulbar hematoma.Postsurgical changes of the left globe with scleral band in place. Dislocated lens layering dependently. Other: None. IMPRESSION: No acute intracranial abnormality,  specifically, no acute hemorrhage, territorial infarction, or intracranial mass.s Electronically Signed   By: Rance Burrows M.D.   On: 12/25/2023 15:32   Data Reviewed: Relevant notes from primary care and specialist visits, past discharge summaries as available in EHR, including Care Everywhere . Prior diagnostic testing as pertinent to current admission diagnoses, Updated medications and problem lists for reconciliation .ED course, including vitals, labs, imaging, treatment and response to treatment,Triage notes, nursing and pharmacy notes and ED provider's notes.Notable results as noted in HPI.Discussed case with EDMD/ ED APP/ or Specialty MD on call and as needed.  Assessment & Plan  >> Hepatic Encephalopathy: We will admit to PCU and monitor with neuro check q 4 hourly.  Lactulose enema x 1. PCOT glucose q2-4 hours.  MRI brain [place]. Thiamine 200 mg daily iv.  B12/ folate/ FT4/TSH.   >> Alcohol abuse: CIWA/ aspiration/ fall/ seizure precaution.   >> Essential hypertension: Vitals:   12/25/23 1330 12/25/23 1643 12/25/23 1651 12/25/23 1655  BP: (!) 156/100 (!) 132/92 (!) 125/91 (!) 136/90   12/25/23 1822  BP: (!) 136/90  PRN hydralazine  as pt is npo due to mental status.    >> CAD/history of NSTEMI: NPO. TNI check ordered.   >> Hypokalemia: Replace IV and follow.    >> Abnormal LFTs/ Hyperbilirubinemia: 2/2 to  alcoholic liver disease/ and fatty liver.  GI consult as deemed appropriate.    >> Decreased pedal pulses in both feet: ABI. Xray imaging as legs have bruising as well.    DVT prophylaxis:  Held until MRI/ SCD's.   Consults:  None.  Advance Care Planning:    Code Status: Full Code   Family Communication:  None Disposition Plan:  To be determined Severity of Illness: The appropriate patient status for this patient is OBSERVATION. Observation status is judged to be reasonable and necessary in order to provide the required intensity of service to ensure  the patient's safety. The patient's presenting symptoms, physical exam findings, and initial radiographic and laboratory data in the context of their medical condition is felt to place them at decreased risk for further clinical deterioration. Furthermore, it is anticipated that the patient will be medically stable for discharge from the hospital within 2 midnights of admission.   Unresulted Labs (From admission, onward)     Start     Ordered   12/26/23 0500  Comprehensive metabolic panel  Tomorrow morning,   R        12/25/23 1914   12/26/23 0500  CBC  Tomorrow morning,   R        12/25/23 1914   12/25/23 1913  Brain natriuretic peptide  Once,   R        12/25/23 1914   12/25/23 1912  T4, free  Add-on,   AD        12/25/23 1914   12/25/23 1912  TSH  Add-on,   AD        12/25/23 1914   12/25/23 1700  Vitamin B1  Once,   R        12/25/23 1700   12/25/23 1338  Urinalysis, Routine w reflex microscopic -Urine, Clean Catch  Once,   URGENT       Question:  Specimen Source  Answer:  Urine, Clean Catch   12/25/23 1338   12/25/23 1338  Urine rapid drug screen (hosp performed)  Once,   STAT        12/25/23 1338            Meds ordered this encounter  Medications   magnesium sulfate IVPB 2 g 50 mL   potassium chloride  10 mEq in 100 mL IVPB   thiamine (VITAMIN B1) injection 100 mg   OR Linked Order Group    LORazepam  (ATIVAN ) tablet 1-4 mg     CIWA-AR < 5 =:   0 mg     CIWA-AR 5 -10 =:   1 mg     CIWA-AR 11 -15 =:   2 mg     CIWA-AR 16 -20 =:   3 mg     CIWA-AR 16 -20 =:   Recheck CIWA-AR in 1 hour; if > 20 notify MD     CIWA-AR > 20 =:   4 mg     CIWA-AR > 20 =:   Call Rapid Response    LORazepam  (ATIVAN ) injection 1-4 mg     CIWA-AR < 5 =:   0 mg     CIWA-AR 5 -10 =:   1 mg     CIWA-AR 11 -15 =:   2 mg     CIWA-AR 16 -20 =:   3 mg     CIWA-AR 16 -20 =:   Recheck CIWA-AR in 1 hour; if > 20 notify MD     CIWA-AR > 20 =:  4 mg     CIWA-AR > 20 =:   Call Rapid Response   folic  acid (FOLVITE ) tablet 1 mg   multivitamin with minerals tablet 1 tablet   0.9 %  sodium chloride  infusion   sodium chloride  flush (NS) 0.9 % injection 3 mL   sodium chloride  flush (NS) 0.9 % injection 3-10 mL   sodium chloride  flush (NS) 0.9 % injection 3-10 mL   nicotine (NICODERM CQ - dosed in mg/24 hours) patch 21 mg     Orders Placed This Encounter  Procedures   CT Head Wo Contrast   MR BRAIN WO CONTRAST   Comprehensive metabolic panel   Ethanol   CBC with Differential   Protime-INR   Ammonia   Urinalysis, Routine w reflex microscopic -Urine, Clean Catch   Urine rapid drug screen (hosp performed)   Magnesium   Vitamin B1   Comprehensive metabolic panel   CBC   T4, free   TSH   Brain natriuretic peptide   Diet NPO time specified   Vital signs every 6 hours X 48 hours, then per unit protocol   Refer to Sidebar Report for reference: ETOH Withdrawal Guidelines   Clinical Institute Withdrawal Assessent (CIWA)   If Ativan  given, reassess Clinical Institute Withdrawal Assessment (CIWA) with blood pressure and pulse rate within 1 hour of Ativan  administration   Notify Pharmacy to change IV Ativan  to PO if tolerating POs well.   Notify physician (specify)   Maintain IV access   Vital signs   Notify physician (specify)   Mobility Protocol: No Restrictions RN to initiate protocols based on patient's level of care   Refer to Sidebar Report Refer to ICU, Med-Surg, Progressive, and Step-Down Mobility Protocol Sidebars   Initiate Adult Central Line Maintenance and Catheter Protocol for patients with central line (CVC, PICC, Port, Hemodialysis, Trialysis)   Daily weights   Intake and Output   Do not place and if present remove PureWick   Initiate Oral Care Protocol   Initiate Carrier Fluid Protocol   RN may order General Admission PRN Orders utilizing General Admission PRN medications (through manage orders) for the following patient needs: allergy symptoms (Claritin), cold sores  (Carmex), cough (Robitussin DM), eye irritation (Liquifilm Tears), hemorrhoids (Tucks), indigestion (Maalox), minor skin irritation (Hydrocortisone Cream), muscle pain Lovena Rubinstein Gay), nose irritation (saline nasal spray) and sore throat (Chloraseptic spray).   SCDs   Neuro checks   Cardiac Monitoring - Continuous Indefinite   Full code   Consult to Transition of Care Team   Consult to hospitalist   Pulse oximetry check with vital signs   Oxygen therapy Mode or (Route): Nasal cannula; Liters Per Minute: 2; Keep O2 saturation between: greater than 92 %   Pulse oximetry, continuous   ED EKG   EKG 12-Lead   Place in observation (patient's expected length of stay will be less than 2 midnights)   Aspiration precautions   Fall precautions   Seizure precautions   Author: Lavanda Porter, MD 12 pm- 8 pm. [facility]. 12/25/2023 7:29 PM >>Please note for any concern,or critical results after hours past 8pm please contact the [facility] floor coverage provider from 7 PM- 7 AM. For on call review [email or web address], username [email or web address] and PW: your phone number<<.

## 2023-12-26 ENCOUNTER — Observation Stay (HOSPITAL_COMMUNITY)

## 2023-12-26 DIAGNOSIS — F32A Depression, unspecified: Secondary | ICD-10-CM | POA: Diagnosis present

## 2023-12-26 DIAGNOSIS — R569 Unspecified convulsions: Secondary | ICD-10-CM | POA: Diagnosis not present

## 2023-12-26 DIAGNOSIS — Z8679 Personal history of other diseases of the circulatory system: Secondary | ICD-10-CM | POA: Diagnosis not present

## 2023-12-26 DIAGNOSIS — K7010 Alcoholic hepatitis without ascites: Secondary | ICD-10-CM | POA: Diagnosis present

## 2023-12-26 DIAGNOSIS — Z7902 Long term (current) use of antithrombotics/antiplatelets: Secondary | ICD-10-CM | POA: Diagnosis not present

## 2023-12-26 DIAGNOSIS — R319 Hematuria, unspecified: Secondary | ICD-10-CM | POA: Diagnosis not present

## 2023-12-26 DIAGNOSIS — Y900 Blood alcohol level of less than 20 mg/100 ml: Secondary | ICD-10-CM | POA: Diagnosis present

## 2023-12-26 DIAGNOSIS — R0989 Other specified symptoms and signs involving the circulatory and respiratory systems: Secondary | ICD-10-CM | POA: Diagnosis not present

## 2023-12-26 DIAGNOSIS — E871 Hypo-osmolality and hyponatremia: Secondary | ICD-10-CM | POA: Diagnosis present

## 2023-12-26 DIAGNOSIS — N320 Bladder-neck obstruction: Secondary | ICD-10-CM | POA: Diagnosis present

## 2023-12-26 DIAGNOSIS — E512 Wernicke's encephalopathy: Secondary | ICD-10-CM | POA: Diagnosis present

## 2023-12-26 DIAGNOSIS — I2489 Other forms of acute ischemic heart disease: Secondary | ICD-10-CM | POA: Diagnosis not present

## 2023-12-26 DIAGNOSIS — N179 Acute kidney failure, unspecified: Secondary | ICD-10-CM | POA: Diagnosis not present

## 2023-12-26 DIAGNOSIS — K7682 Hepatic encephalopathy: Secondary | ICD-10-CM | POA: Diagnosis present

## 2023-12-26 DIAGNOSIS — Z7982 Long term (current) use of aspirin: Secondary | ICD-10-CM | POA: Diagnosis not present

## 2023-12-26 DIAGNOSIS — J9811 Atelectasis: Secondary | ICD-10-CM | POA: Diagnosis not present

## 2023-12-26 DIAGNOSIS — F419 Anxiety disorder, unspecified: Secondary | ICD-10-CM | POA: Diagnosis present

## 2023-12-26 DIAGNOSIS — E876 Hypokalemia: Secondary | ICD-10-CM | POA: Diagnosis not present

## 2023-12-26 DIAGNOSIS — R110 Nausea: Secondary | ICD-10-CM | POA: Diagnosis not present

## 2023-12-26 DIAGNOSIS — E860 Dehydration: Secondary | ICD-10-CM | POA: Diagnosis not present

## 2023-12-26 DIAGNOSIS — E785 Hyperlipidemia, unspecified: Secondary | ICD-10-CM | POA: Diagnosis present

## 2023-12-26 DIAGNOSIS — I2510 Atherosclerotic heart disease of native coronary artery without angina pectoris: Secondary | ICD-10-CM | POA: Diagnosis present

## 2023-12-26 DIAGNOSIS — I252 Old myocardial infarction: Secondary | ICD-10-CM | POA: Diagnosis not present

## 2023-12-26 DIAGNOSIS — I10 Essential (primary) hypertension: Secondary | ICD-10-CM | POA: Diagnosis present

## 2023-12-26 DIAGNOSIS — F17210 Nicotine dependence, cigarettes, uncomplicated: Secondary | ICD-10-CM | POA: Diagnosis present

## 2023-12-26 DIAGNOSIS — F10231 Alcohol dependence with withdrawal delirium: Secondary | ICD-10-CM | POA: Diagnosis not present

## 2023-12-26 DIAGNOSIS — R918 Other nonspecific abnormal finding of lung field: Secondary | ICD-10-CM | POA: Diagnosis not present

## 2023-12-26 DIAGNOSIS — R0602 Shortness of breath: Secondary | ICD-10-CM | POA: Diagnosis not present

## 2023-12-26 DIAGNOSIS — Z955 Presence of coronary angioplasty implant and graft: Secondary | ICD-10-CM | POA: Diagnosis not present

## 2023-12-26 DIAGNOSIS — E8770 Fluid overload, unspecified: Secondary | ICD-10-CM | POA: Diagnosis present

## 2023-12-26 DIAGNOSIS — R5383 Other fatigue: Secondary | ICD-10-CM | POA: Diagnosis not present

## 2023-12-26 LAB — COMPREHENSIVE METABOLIC PANEL WITH GFR
ALT: 53 U/L — ABNORMAL HIGH (ref 0–44)
AST: 116 U/L — ABNORMAL HIGH (ref 15–41)
Albumin: 1.8 g/dL — ABNORMAL LOW (ref 3.5–5.0)
Alkaline Phosphatase: 88 U/L (ref 38–126)
Anion gap: 12 (ref 5–15)
BUN: 12 mg/dL (ref 6–20)
CO2: 35 mmol/L — ABNORMAL HIGH (ref 22–32)
Calcium: 7.7 mg/dL — ABNORMAL LOW (ref 8.9–10.3)
Chloride: 85 mmol/L — ABNORMAL LOW (ref 98–111)
Creatinine, Ser: 0.68 mg/dL (ref 0.61–1.24)
GFR, Estimated: >60 mL/min (ref >60)
Glucose, Bld: 97 mg/dL (ref 70–99)
Potassium: 2.5 mmol/L — CL (ref 3.5–5.1)
Sodium: 132 mmol/L — ABNORMAL LOW (ref 135–145)
Total Bilirubin: 2 mg/dL — ABNORMAL HIGH (ref 0.0–1.2)
Total Protein: 6.1 g/dL — ABNORMAL LOW (ref 6.5–8.1)

## 2023-12-26 LAB — CBC
HCT: 37.8 % — ABNORMAL LOW (ref 39.0–52.0)
Hemoglobin: 13 g/dL (ref 13.0–17.0)
MCH: 34.4 pg — ABNORMAL HIGH (ref 26.0–34.0)
MCHC: 34.4 g/dL (ref 30.0–36.0)
MCV: 100 fL (ref 80.0–100.0)
Platelets: 157 10*3/uL (ref 150–400)
RBC: 3.78 MIL/uL — ABNORMAL LOW (ref 4.22–5.81)
RDW: 18.2 % — ABNORMAL HIGH (ref 11.5–15.5)
WBC: 9.8 10*3/uL (ref 4.0–10.5)
nRBC: 0 % (ref 0.0–0.2)

## 2023-12-26 LAB — TROPONIN I (HIGH SENSITIVITY): Troponin I (High Sensitivity): 72 ng/L — ABNORMAL HIGH (ref <18)

## 2023-12-26 LAB — AMMONIA: Ammonia: 30 umol/L (ref 9–35)

## 2023-12-26 LAB — VITAMIN B12: Vitamin B-12: 700 pg/mL (ref 180–914)

## 2023-12-26 LAB — MAGNESIUM: Magnesium: 2.3 mg/dL (ref 1.7–2.4)

## 2023-12-26 MED ORDER — FLUOXETINE HCL 20 MG PO CAPS
40.0000 mg | ORAL_CAPSULE | Freq: Every day | ORAL | Status: DC
  Administered 2023-12-28 – 2024-01-07 (×11): 40 mg via ORAL
  Filled 2023-12-26 (×14): qty 2

## 2023-12-26 MED ORDER — POTASSIUM CHLORIDE 10 MEQ/100ML IV SOLN
10.0000 meq | INTRAVENOUS | Status: DC
  Filled 2023-12-26: qty 100

## 2023-12-26 MED ORDER — POTASSIUM CHLORIDE 10 MEQ/100ML IV SOLN
10.0000 meq | INTRAVENOUS | Status: AC
  Filled 2023-12-26 (×5): qty 100

## 2023-12-26 MED ORDER — ATORVASTATIN CALCIUM 80 MG PO TABS
80.0000 mg | ORAL_TABLET | Freq: Every day | ORAL | Status: DC
  Administered 2023-12-28 – 2023-12-30 (×3): 80 mg via ORAL
  Filled 2023-12-26 (×5): qty 1

## 2023-12-26 MED ORDER — POTASSIUM CHLORIDE IN NACL 40-0.9 MEQ/L-% IV SOLN
INTRAVENOUS | Status: DC
  Filled 2023-12-26 (×3): qty 1000

## 2023-12-26 MED ORDER — LACTULOSE 10 GM/15ML PO SOLN
30.0000 g | Freq: Three times a day (TID) | ORAL | Status: DC
  Administered 2023-12-28 – 2023-12-30 (×7): 30 g via ORAL
  Filled 2023-12-26 (×11): qty 60

## 2023-12-26 MED ORDER — ASPIRIN 81 MG PO TBEC
81.0000 mg | DELAYED_RELEASE_TABLET | Freq: Every day | ORAL | Status: DC
  Administered 2023-12-28 – 2024-01-07 (×11): 81 mg via ORAL
  Filled 2023-12-26 (×13): qty 1

## 2023-12-26 MED ORDER — SODIUM CHLORIDE 0.9 % IV SOLN: INTRAVENOUS | Status: DC

## 2023-12-26 MED ORDER — POTASSIUM CHLORIDE 20 MEQ PO PACK
40.0000 meq | PACK | ORAL | Status: DC
  Filled 2023-12-26: qty 2

## 2023-12-26 MED ORDER — NITROGLYCERIN 0.4 MG SL SUBL: 0.4000 mg | SUBLINGUAL_TABLET | SUBLINGUAL | Status: DC | PRN

## 2023-12-26 NOTE — Plan of Care (Signed)
   Problem: Activity: Goal: Risk for activity intolerance will decrease Outcome: Progressing   Problem: Nutrition: Goal: Adequate nutrition will be maintained Outcome: Progressing   Problem: Coping: Goal: Level of anxiety will decrease Outcome: Progressing   Problem: Elimination: Goal: Will not experience complications related to bowel motility Outcome: Progressing   Problem: Pain Managment: Goal: General experience of comfort will improve and/or be controlled Outcome: Progressing   Problem: Safety: Goal: Ability to remain free from injury will improve Outcome: Progressing   Problem: Skin Integrity: Goal: Risk for impaired skin integrity will decrease Outcome: Progressing

## 2023-12-26 NOTE — ED Notes (Signed)
 Attempted report no answer. Patient transferred to floor.

## 2023-12-26 NOTE — Progress Notes (Signed)
 Patient arrived to the unit drowsy but arousable. Patient is on room air but desat's during sleep and so 2L nasal cannula was placed on him. Patient IV is intact and he denies any pain. Patient skin is intact.

## 2023-12-26 NOTE — ED Notes (Signed)
 Attempted to review enema with patient is lethargic unable to follow commands.at this time.

## 2023-12-26 NOTE — Plan of Care (Signed)
 Patient has been sleeping since admitted to the floor. Patient is calm and cooperative.

## 2023-12-26 NOTE — ED Notes (Signed)
 Attempted to review enema with patient is lethargic, unable to follow commands at this time patient

## 2023-12-26 NOTE — Progress Notes (Signed)
 PROGRESS NOTE    Jeffrey Hodges  ZOX:[id number] DOB: 11-Sep-1965 DOA: 12/25/2023 PCP: Dorothe Gaster, NP   Brief Narrative:  Jeffrey Hodges is a 58 y.o. male with past medical history  of heart disease status post stent, heavy alcohol abuse, anemia, AKI, depression anxiety, elevated LFTs, hyperlipidemia, tobacco abuse, essential hypertension, was brought into the hospital due to less responsiveness and abnormal behavior.  Patient sees GI and cardiology.  It is being suspected that patient drinks 2 bottles of liquor essentially every day.  Patient was admitted with hepatic encephalopathy.  Assessment & Plan:   Principal Problem:   Lethargy  Acute hepatic encephalopathy: Patient was apparently very obtunded upon admission.  Ammonia level was 49 however it looks like this is kind of his baseline.  He was ordered lactulose  enema however for some reason that was not given.  Patient this morning is still very sleepy and obtunded but he is still answering the questions.  He is oriented to self and place.  Multiple family members including wife mother and father at the bedside.  Nurse at the bedside.  I requested the nurse to go ahead and give him lactulose  enema.  Once he is more awake, we will start him on p.o. lactulose .  MRI unremarkable.  Checking his ammonia level today.  Alcohol dependence: His alcohol level was within normal limit.  Per wife, he has longstanding history of alcohol abuse however he has been to alcohol rehab for 3 times in his life and at 1 point in time, he was sober for 10 years however he resumed drinking about a year ago when he lost his job.  Per wife, he sneaks alcohol when she is in the job so she has no idea how much she is drinking.  At the moment, he is up-to-date, no withdrawal symptoms.  Continue CIWA protocol with multivitamin.  Hypokalemia: Lower than yesterday.  Per nurses, he is able to take p.o.  I have ordered p.o. potassium for him.  Magnesium  was normal yesterday  however I will recheck today.  Alcoholic hepatitis/elevated LFTs: AST much greater than ALT indicative of alcoholic hepatitis.  LFTs improved somewhat from yesterday and so is bilirubin.  Chronic hyponatremia: Baseline low 130s.  Currently at baseline.  Secondary to alcoholism.  History of CAD status post stent, elevated troponin: Stent placed in February 2024.  Troponin elevated but flat.  Patient with no ACS symptoms.  Likely demand ischemia.  Looks like he was on DAPT for a year after cath.  He should be on only aspirin  now.  Resuming that.  Last echo in chart from September 2024 shows normal ejection fraction and no diastolic dysfunction.  I am going to repeat his echo.  Anxiety/depression: Resuming Prozac  but holding BuSpar  for now.  Hyperlipidemia: Resume statin.  Looks like he is not taking at home.  DVT prophylaxis: SCDs Start: 12/25/23 1909   Code Status: Full Code  Family Communication: Wife, mother and father present at bedside.  Plan of care discussed with patient in length and he/she verbalized understanding and agreed with it.  Status is: Observation The patient will require care spanning > 2 midnights and should be moved to inpatient because: Still very encephalopathic   Estimated body mass index is 25.27 kg/m as calculated from the following:   Height as of this encounter: 6' (1.829 m).   Weight as of this encounter: 84.5 kg.    Nutritional Assessment: Body mass index is 25.27 kg/m.Aaron Aas Seen by dietician.  I agree with the assessment and plan as outlined below: Nutrition Status:        . Skin Assessment: I have examined the patient's skin and I agree with the wound assessment as performed by the wound care RN as outlined below:    Consultants:  None  Procedures:  None  Antimicrobials:  Anti-infectives (From admission, onward)    None         Subjective: Patient seen and examined, still obtunded.  Hard to arouse.  When he was awake, he was able to  tell me where he was and the fact that he was in the hospital.  Could not answer any other question.  Kept his eyes closed during the whole conversation.  Multiple family members at the bedside.  Objective: Vitals:   12/26/23 0206 12/26/23 0222 12/26/23 0400 12/26/23 0734  BP:   125/86 137/88  Pulse: 97 100 (!) 104 96  Resp: 19 15 (!) 21 (!) 21  Temp:    (!) 97.5 F (36.4 C)  TempSrc:    Axillary  SpO2: 90% 95% 92% 95%  Weight:      Height:        Intake/Output Summary (Last 24 hours) at 12/26/2023 1129 Last data filed at 12/25/2023 1820 Gross per 24 hour  Intake 149.22 ml  Output --  Net 149.22 ml   Filed Weights   12/25/23 1332 12/26/23 0203  Weight: 90.7 kg 84.5 kg    Examination:  General exam: Appears calm and comfortable but partially obtunded Respiratory system: Clear to auscultation. Respiratory effort normal. Cardiovascular system: S1 & S2 heard, RRR. No JVD, murmurs, rubs, gallops or clicks. No pedal edema. Gastrointestinal system: Abdomen is nondistended, soft and nontender. No organomegaly or masses felt. Normal bowel sounds heard. Central nervous system: Partially obtunded, oriented x 1.  No focal deficit.  Moving all extremities spontaneously.  Data Reviewed: I have personally reviewed following labs and imaging studies  CBC: Recent Labs  Lab 12/25/23 1338 12/26/23 0717  WBC 8.3 9.8  NEUTROABS 6.2  --   HGB 13.9 13.0  HCT 39.9 37.8*  MCV 98.0 100.0  PLT 161 157   Basic Metabolic Panel: Recent Labs  Lab 12/25/23 1338 12/25/23 1531 12/26/23 0717  NA 131*  --  132*  K 2.8*  --  2.5*  CL 81*  --  85*  CO2 35*  --  35*  GLUCOSE 118*  --  97  BUN 9  --  12  CREATININE 0.93  --  0.68  CALCIUM  7.9*  --  7.7*  MG  --  2.2  --    GFR: Estimated Creatinine Clearance: 114.5 mL/min (by C-G formula based on SCr of 0.68 mg/dL). Liver Function Tests: Recent Labs  Lab 12/25/23 1338 12/26/23 0717  AST 142* 116*  ALT 55* 53*  ALKPHOS 94 88  BILITOT  2.5* 2.0*  PROT 6.5 6.1*  ALBUMIN 1.8* 1.8*   No results for input(s): LIPASE, AMYLASE in the last 168 hours. Recent Labs  Lab 12/25/23 1338  AMMONIA 49*   Coagulation Profile: Recent Labs  Lab 12/25/23 1338  INR 1.2   Cardiac Enzymes: No results for input(s): CKTOTAL, CKMB, CKMBINDEX, TROPONINI in the last 168 hours. BNP (last 3 results) No results for input(s): PROBNP in the last 8760 hours. HbA1C: No results for input(s): HGBA1C in the last 72 hours. CBG: No results for input(s): GLUCAP in the last 168 hours. Lipid Profile: No results for input(s): CHOL, HDL, LDLCALC, TRIG,  CHOLHDL, LDLDIRECT in the last 72 hours. Thyroid  Function Tests: Recent Labs    12/25/23 1912 12/25/23 1913  TSH 1.453  --   FREET4  --  0.76   Anemia Panel: Recent Labs    12/25/23 1913 12/26/23 0717  VITAMINB12  --  700  FOLATE 4.5*  --    Sepsis Labs: No results for input(s): PROCALCITON, LATICACIDVEN in the last 168 hours.  No results found for this or any previous visit (from the past 240 hours).   Radiology Studies: MR BRAIN WO CONTRAST Result Date: 12/25/2023 CLINICAL DATA:  Initial evaluation for acute mental status change, unknown cause. EXAM: MRI HEAD WITHOUT CONTRAST TECHNIQUE: Multiplanar, multiecho pulse sequences of the brain and surrounding structures were obtained without intravenous contrast. COMPARISON:  CT from earlier the same day. FINDINGS: Brain: Examination moderately to severely degraded by motion artifact. Cerebral volume within normal limits. Patchy T2/FLAIR hyperintensity involving the periventricular and deep white matter both cerebral hemispheres, most characteristic of chronic microvascular ischemic disease, mild in nature. No visible foci of restricted diffusion to suggest acute or subacute ischemia. No areas of chronic cortical infarction. No acute or chronic intracranial blood products. No mass lesion, midline shift or mass  effect. No hydrocephalus or extra-axial fluid collection. Pituitary gland within normal limits. Vascular: Major intracranial vascular flow voids are maintained. Skull and upper cervical spine: Cranial junction within normal limits. Bone marrow signal intensity normal. No scalp soft tissue abnormality. Sinuses/Orbits: Postoperative changes noted about the left globe. Globes orbital soft tissues demonstrate no acute abnormality. Paranasal sinuses are largely clear. No significant mastoid effusion. Other: None. IMPRESSION: 1. Motion degraded exam. 2. No acute intracranial abnormality. 3. Mild chronic microvascular ischemic disease for age. Electronically Signed   By: Virgia Griffins M.D.   On: 12/25/2023 21:28   CT Head Wo Contrast Result Date: 12/25/2023 CLINICAL DATA:  Mental status change, unknown cause EXAM: CT HEAD WITHOUT CONTRAST TECHNIQUE: Contiguous axial images were obtained from the base of the skull through the vertex without intravenous contrast. RADIATION DOSE REDUCTION: This exam was performed according to the departmental dose-optimization program which includes automated exposure control, adjustment of the mA and/or kV according to patient size and/or use of iterative reconstruction technique. COMPARISON:  April 02, 2023 FINDINGS: Brain: Proportional prominence of the ventricles and sulci, consistent with diffuse cerebral parenchymal volume loss. The ventricles otherwise maintained midline position without midline shift. Gray-white differentiation is preserved without focal attenuation abnormality.No evidence of acute territorial infarction, extra-axial fluid collection, hemorrhage, or mass lesion. The basilar cisterns are patent without downward herniation. The cerebellar hemispheres and vermis are well formed without mass lesion or focal attenuation abnormality. Vascular: No hyperdense vessel. Skull: Normal. Negative for fracture or focal lesion. Sinuses/Orbits: Significant leftward  deviation of the nasal septum. The paranasal sinuses and mastoids are clear.The globes appear intact. No retrobulbar hematoma.Postsurgical changes of the left globe with scleral band in place. Dislocated lens layering dependently. Other: None. IMPRESSION: No acute intracranial abnormality, specifically, no acute hemorrhage, territorial infarction, or intracranial mass.s Electronically Signed   By: Rance Burrows M.D.   On: 12/25/2023 15:32    Scheduled Meds:  folic acid   1 mg Oral Daily   lactulose   300 mL Rectal Once   multivitamin with minerals  1 tablet Oral Daily   nicotine   21 mg Transdermal Daily   potassium chloride   40 mEq Oral Q4H   sodium chloride  flush  3 mL Intravenous Q12H   sodium chloride  flush  3-10 mL Intravenous  Q12H   Continuous Infusions:  0.9 % NaCl with KCl 40 mEq / L       LOS: 0 days   Modena Andes, MD [facility]  12/26/2023, 11:29 AM   *Please note that this is a verbal dictation therefore any spelling or grammatical errors are due to the Dragon Medical One system interpretation.  Please page via Amion and do not message via secure chat for urgent patient care matters. Secure chat can be used for non urgent patient care matters.  How to contact the [facility] Attending or Consulting provider 7A - 7P or covering provider during after hours 7P -7A, for this patient?  Check the care team in [facility] and look for a) attending/consulting [facility] provider listed and b) the [facility] team listed. Page or secure chat 7A-7P. Log into [email or web address] and use [place] universal password to access. If you do not have the password, please contact the hospital operator. Locate the [facility] provider you are looking for under [facility] and page to a number that you can be directly reached. If you still have difficulty reaching the provider, please page the [facility] (Director on Call) for the Hospitalists listed on amion for assistance.

## 2023-12-26 NOTE — Progress Notes (Signed)
 Attempted to given Lactulose  enema, wife and patient refused. Dr Lilyan Remedies informed.

## 2023-12-26 NOTE — Progress Notes (Signed)
 Critical lab result. Potassium 2.5 Dr Lilyan Remedies informed.

## 2023-12-27 ENCOUNTER — Inpatient Hospital Stay (HOSPITAL_COMMUNITY)

## 2023-12-27 DIAGNOSIS — R569 Unspecified convulsions: Secondary | ICD-10-CM | POA: Diagnosis not present

## 2023-12-27 DIAGNOSIS — R5383 Other fatigue: Secondary | ICD-10-CM | POA: Diagnosis not present

## 2023-12-27 DIAGNOSIS — I2489 Other forms of acute ischemic heart disease: Secondary | ICD-10-CM

## 2023-12-27 LAB — CBC WITH DIFFERENTIAL/PLATELET
Abs Immature Granulocytes: 0.05 10*3/uL (ref 0.00–0.07)
Basophils Absolute: 0 10*3/uL (ref 0.0–0.1)
Basophils Relative: 0 %
Eosinophils Absolute: 0.2 10*3/uL (ref 0.0–0.5)
Eosinophils Relative: 2 %
HCT: 36 % — ABNORMAL LOW (ref 39.0–52.0)
Hemoglobin: 12 g/dL — ABNORMAL LOW (ref 13.0–17.0)
Immature Granulocytes: 1 %
Lymphocytes Relative: 12 %
Lymphs Abs: 1.3 10*3/uL (ref 0.7–4.0)
MCH: 33.5 pg (ref 26.0–34.0)
MCHC: 33.3 g/dL (ref 30.0–36.0)
MCV: 100.6 fL — ABNORMAL HIGH (ref 80.0–100.0)
Monocytes Absolute: 0.9 10*3/uL (ref 0.1–1.0)
Monocytes Relative: 8 %
Neutro Abs: 8.6 10*3/uL — ABNORMAL HIGH (ref 1.7–7.7)
Neutrophils Relative %: 77 %
Platelets: 149 10*3/uL — ABNORMAL LOW (ref 150–400)
RBC: 3.58 MIL/uL — ABNORMAL LOW (ref 4.22–5.81)
RDW: 18.6 % — ABNORMAL HIGH (ref 11.5–15.5)
WBC: 11.1 10*3/uL — ABNORMAL HIGH (ref 4.0–10.5)
nRBC: 0.2 % (ref 0.0–0.2)

## 2023-12-27 LAB — URINALYSIS, ROUTINE W REFLEX MICROSCOPIC
Bacteria, UA: NONE SEEN
Glucose, UA: NEGATIVE mg/dL
Hgb urine dipstick: NEGATIVE
Ketones, ur: 5 mg/dL — AB
Leukocytes,Ua: NEGATIVE
Nitrite: NEGATIVE
Protein, ur: 30 mg/dL — AB
Specific Gravity, Urine: 1.04 — ABNORMAL HIGH (ref 1.005–1.030)
pH: 5 (ref 5.0–8.0)

## 2023-12-27 LAB — COMPREHENSIVE METABOLIC PANEL WITH GFR
ALT: 50 U/L — ABNORMAL HIGH (ref 0–44)
AST: 99 U/L — ABNORMAL HIGH (ref 15–41)
Albumin: 1.7 g/dL — ABNORMAL LOW (ref 3.5–5.0)
Alkaline Phosphatase: 78 U/L (ref 38–126)
Anion gap: 11 (ref 5–15)
BUN: 13 mg/dL (ref 6–20)
CO2: 32 mmol/L (ref 22–32)
Calcium: 8.1 mg/dL — ABNORMAL LOW (ref 8.9–10.3)
Chloride: 93 mmol/L — ABNORMAL LOW (ref 98–111)
Creatinine, Ser: 0.66 mg/dL (ref 0.61–1.24)
GFR, Estimated: >60 mL/min (ref >60)
Glucose, Bld: 113 mg/dL — ABNORMAL HIGH (ref 70–99)
Potassium: 3 mmol/L — ABNORMAL LOW (ref 3.5–5.1)
Sodium: 136 mmol/L (ref 135–145)
Total Bilirubin: 1.7 mg/dL — ABNORMAL HIGH (ref 0.0–1.2)
Total Protein: 6.2 g/dL — ABNORMAL LOW (ref 6.5–8.1)

## 2023-12-27 LAB — ECHOCARDIOGRAM COMPLETE
Area-P 1/2: 4.49 cm2
Height: 72 in
S' Lateral: 2.9 cm
Weight: 2970.04 [oz_av]

## 2023-12-27 LAB — MAGNESIUM: Magnesium: 2.1 mg/dL (ref 1.7–2.4)

## 2023-12-27 LAB — RAPID URINE DRUG SCREEN, HOSP PERFORMED
Amphetamines: NOT DETECTED
Barbiturates: NOT DETECTED
Benzodiazepines: POSITIVE — AB
Cocaine: NOT DETECTED
Opiates: NOT DETECTED
Tetrahydrocannabinol: POSITIVE — AB

## 2023-12-27 LAB — BRAIN NATRIURETIC PEPTIDE: B Natriuretic Peptide: 122.9 pg/mL — ABNORMAL HIGH (ref 0.0–100.0)

## 2023-12-27 LAB — C-REACTIVE PROTEIN: CRP: 3.2 mg/dL — ABNORMAL HIGH (ref <1.0)

## 2023-12-27 LAB — RPR: RPR Ser Ql: NONREACTIVE

## 2023-12-27 LAB — PROCALCITONIN: Procalcitonin: 0.47 ng/mL

## 2023-12-27 LAB — PHOSPHORUS: Phosphorus: 1.9 mg/dL — ABNORMAL LOW (ref 2.5–4.6)

## 2023-12-27 MED ORDER — POTASSIUM CHLORIDE CRYS ER 20 MEQ PO TBCR
40.0000 meq | EXTENDED_RELEASE_TABLET | Freq: Two times a day (BID) | ORAL | Status: AC
  Administered 2023-12-28: 40 meq via ORAL
  Filled 2023-12-27 (×2): qty 2

## 2023-12-27 MED ORDER — POTASSIUM CHLORIDE CRYS ER 20 MEQ PO TBCR: 40.0000 meq | EXTENDED_RELEASE_TABLET | Freq: Once | ORAL | Status: DC

## 2023-12-27 MED ORDER — DEXTROSE-SODIUM CHLORIDE 5-0.45 % IV SOLN: INTRAVENOUS | Status: AC

## 2023-12-27 MED ORDER — THIAMINE MONONITRATE 100 MG PO TABS: 100.0000 mg | ORAL_TABLET | Freq: Every day | ORAL | Status: DC

## 2023-12-27 MED ORDER — HEPARIN SODIUM (PORCINE) 5000 UNIT/ML IJ SOLN
5000.0000 [IU] | Freq: Three times a day (TID) | INTRAMUSCULAR | Status: DC
  Administered 2023-12-28 – 2024-01-07 (×32): 5000 [IU] via SUBCUTANEOUS
  Filled 2023-12-27 (×32): qty 1

## 2023-12-27 MED ORDER — SODIUM PHOSPHATES 45 MMOLE/15ML IV SOLN
30.0000 mmol | Freq: Once | INTRAVENOUS | Status: AC
  Filled 2023-12-27: qty 10

## 2023-12-27 MED ORDER — POTASSIUM PHOSPHATES 15 MMOLE/5ML IV SOLN: 30.0000 mmol | Freq: Once | INTRAVENOUS | Status: DC

## 2023-12-27 MED ORDER — PANTOPRAZOLE SODIUM 40 MG PO TBEC
40.0000 mg | DELAYED_RELEASE_TABLET | Freq: Every day | ORAL | Status: DC
  Administered 2023-12-28 – 2024-01-07 (×11): 40 mg via ORAL
  Filled 2023-12-27 (×12): qty 1

## 2023-12-27 MED ORDER — CHLORDIAZEPOXIDE HCL 5 MG PO CAPS
5.0000 mg | ORAL_CAPSULE | Freq: Three times a day (TID) | ORAL | Status: DC
  Administered 2023-12-28: 5 mg via ORAL
  Filled 2023-12-27 (×3): qty 1

## 2023-12-27 MED ORDER — THIAMINE MONONITRATE 100 MG PO TABS
500.0000 mg | ORAL_TABLET | Freq: Every day | ORAL | Status: DC
  Administered 2023-12-27: 500 mg via ORAL
  Filled 2023-12-27: qty 5

## 2023-12-27 NOTE — Progress Notes (Signed)
 PROGRESS NOTE                                                                                                                                                                                                             Patient Demographics:    Jeffrey Hodges, is a 58 y.o. male, DOB - [birth year], BJY:[id number]  Outpatient Primary MD for the patient is Jeffrey Gaster, NP    LOS - 1  Admit date - 12/25/2023    Chief Complaint  Patient presents with   Alcohol Problem       Brief Narrative (HPI from H&P)   58 y.o. male with past medical history  of heart disease status post stent, heavy alcohol abuse, anemia, AKI, depression anxiety, elevated LFTs, hyperlipidemia, tobacco abuse, essential hypertension, was brought into the hospital due to less responsiveness and abnormal behavior.  Patient sees GI and cardiology.  It is being suspected that patient drinks 2 bottles of liquor essentially every day.  Patient was admitted with hepatic encephalopathy.    Subjective:    Jeffrey Hodges today has, No headache, No chest pain, No abdominal pain - No Nausea, No new weakness tingling or numbness, no SOB   Assessment  & Plan :     Acute hepatic encephalopathy: Patient was admitted with ammonia levels of around 49 which is close to his baseline, ammonia levels have trended down, he has undergone MRI brain which is nonacute, mentation has improved marginally but still quite confused, question if he is developing Wernicke's encephalopathy, placed on high-dose IV thiamine , check EEG, RPR, no headache or focal deficits.  Continue IV fluids, PT and speech eval and monitor.  TSH and B12 stable.  Check thiamine  levels.   Alcohol dependence: Consult to quit, CIWA protocol and Librium.   Hypokalemia, hypophosphatemia: Replaced  Alcoholic hepatitis/elevated LFTs: Mild, continue to monitor, check INR to evaluate synthetic function.   Chronic  hyponatremia: Baseline low 130s.  Currently at baseline.  Secondary to alcoholism.   History of CAD status post stent, elevated troponin, it was mild and nonspecific: Stent placed in February 2024.  Troponin elevated but flat.  Patient with no ACS symptoms.  Likely demand ischemia.  Looks like he was on DAPT for a year after cath.  Continue aspirin , echo pending.   Anxiety/depression: Resuming Prozac  but holding BuSpar  for now.  Hyperlipidemia: Resume statin.  Looks like he is not taking at home.         Condition - Extremely Guarded  Family Communication  : Parents bedside on 12/27/2023  Code Status : Full code  Consults  :  None  PUD Prophylaxis :  PPI   Procedures  :     TTE  EEG  MRI - 1. Motion degraded exam. 2. No acute intracranial abnormality. 3. Mild chronic microvascular ischemic disease for age.      Disposition Plan  :    Status is: Inpatient    DVT Prophylaxis  :  Heparin   heparin  injection 5,000 Units Start: 12/27/23 1400 SCDs Start: 12/25/23 1909    Lab Results  Component Value Date   PLT 149 (L) 12/27/2023    Diet :  Diet Order             DIET SOFT Fluid consistency: Thin  Diet effective now                    Inpatient Medications  Scheduled Meds:  aspirin  EC  81 mg Oral Daily   atorvastatin   80 mg Oral Daily   chlordiazePOXIDE  5 mg Oral TID   FLUoxetine   40 mg Oral Daily   folic acid   1 mg Oral Daily   heparin  injection (subcutaneous)  5,000 Units Subcutaneous Q8H   lactulose   30 g Oral TID   lactulose   300 mL Rectal Once   multivitamin with minerals  1 tablet Oral Daily   nicotine   21 mg Transdermal Daily   pantoprazole  40 mg Oral Daily   potassium chloride   40 mEq Oral BID   thiamine   500 mg Oral Daily   Continuous Infusions:  dextrose 5 % and 0.45 % NaCl     sodium PHOSPHATE IVPB (in mmol)     PRN Meds:.LORazepam  **OR** LORazepam , nitroGLYCERIN , sodium chloride  flush  Antibiotics  :    Anti-infectives (From  admission, onward)    None         Objective:   Vitals:   12/27/23 0010 12/27/23 0500 12/27/23 0628 12/27/23 0800  BP: (!) 146/91  (!) 135/92 138/84  Pulse: 97  93 93  Resp: 19  (!) 21 (!) 21  Temp: 98.4 F (36.9 C)  97.9 F (36.6 C) 98.2 F (36.8 C)  TempSrc: Axillary  Axillary   SpO2: 97%  97%   Weight:  84.2 kg    Height:        Wt Readings from Last 3 Encounters:  12/27/23 84.2 kg  10/20/23 90.3 kg  05/13/23 89.9 kg     Intake/Output Summary (Last 24 hours) at 12/27/2023 1000 Last data filed at 12/26/2023 1658 Gross per 24 hour  Intake --  Output 150 ml  Net -150 ml     Physical Exam  Awake but confused, No new F.N deficits, Normal affect [place].AT,PERRAL Supple Neck, No JVD,   Symmetrical Chest wall movement, Good air movement bilaterally, CTAB RRR,No Gallops,Rubs or new Murmurs,  +ve B.Sounds, Abd Soft, No tenderness,   No Cyanosis, Clubbing or edema       Data Review:    Recent Labs  Lab 12/25/23 1338 12/26/23 0717 12/27/23 0735  WBC 8.3 9.8 11.1*  HGB 13.9 13.0 12.0*  HCT 39.9 37.8* 36.0*  PLT 161 157 149*  MCV 98.0 100.0 100.6*  MCH 34.2* 34.4* 33.5  MCHC 34.8 34.4 33.3  RDW 18.3* 18.2* 18.6*  LYMPHSABS 1.3  --  1.3  MONOABS 0.7  --  0.9  EOSABS 0.1  --  0.2  BASOSABS 0.0  --  0.0    Recent Labs  Lab 12/25/23 1338 12/25/23 1531 12/25/23 1912 12/26/23 0717 12/26/23 1200 12/26/23 1651 12/27/23 0735  NA 131*  --   --  132*  --   --  136  K 2.8*  --   --  2.5*  --   --  3.0*  CL 81*  --   --  85*  --   --  93*  CO2 35*  --   --  35*  --   --  32  ANIONGAP 15  --   --  12  --   --  11  GLUCOSE 118*  --   --  97  --   --  113*  BUN 9  --   --  12  --   --  13  CREATININE 0.93  --   --  0.68  --   --  0.66  AST 142*  --   --  116*  --   --  99*  ALT 55*  --   --  53*  --   --  50*  ALKPHOS 94  --   --  88  --   --  78  BILITOT 2.5*  --   --  2.0*  --   --  1.7*  ALBUMIN 1.8*  --   --  1.8*  --   --  1.7*  CRP  --   --   --    --   --   --  3.2*  INR 1.2  --   --   --   --   --   --   TSH  --   --  1.453  --   --   --   --   AMMONIA 49*  --   --   --   --  30  --   BNP 58.9  --   --   --   --   --  122.9*  MG  --  2.2  --   --  2.3  --  2.1  PHOS  --   --   --   --   --   --  1.9*  CALCIUM  7.9*  --   --  7.7*  --   --  8.1*      Recent Labs  Lab 12/25/23 1338 12/25/23 1531 12/25/23 1912 12/26/23 0717 12/26/23 1200 12/26/23 1651 12/27/23 0735  CRP  --   --   --   --   --   --  3.2*  INR 1.2  --   --   --   --   --   --   TSH  --   --  1.453  --   --   --   --   AMMONIA 49*  --   --   --   --  30  --   BNP 58.9  --   --   --   --   --  122.9*  MG  --  2.2  --   --  2.3  --  2.1  CALCIUM  7.9*  --   --  7.7*  --   --  8.1*    --------------------------------------------------------------------------------------------------------------- Lab Results  Component Value Date   CHOL 301 (H) 09/10/2022   HDL 78 09/10/2022  LDLCALC 207 (H) 09/10/2022   TRIG 82 09/10/2022   CHOLHDL 3.9 09/10/2022    Lab Results  Component Value Date   HGBA1C 5.4 09/10/2022   Recent Labs    12/25/23 1912 12/25/23 1913  TSH 1.453  --   FREET4  --  0.76   Recent Labs    12/25/23 1913 12/26/23 0717  VITAMINB12  --  700  FOLATE 4.5*  --    ------------------------------------------------------------------------------------------------------------------ Cardiac Enzymes No results for input(s): CKMB, TROPONINI, MYOGLOBIN in the last 168 hours.  Invalid input(s): CK  Micro Results No results found for this or any previous visit (from the past 240 hours).  Radiology Report MR BRAIN WO CONTRAST Result Date: 12/25/2023 CLINICAL DATA:  Initial evaluation for acute mental status change, unknown cause. EXAM: MRI HEAD WITHOUT CONTRAST TECHNIQUE: Multiplanar, multiecho pulse sequences of the brain and surrounding structures were obtained without intravenous contrast. COMPARISON:  CT from earlier the same day.  FINDINGS: Brain: Examination moderately to severely degraded by motion artifact. Cerebral volume within normal limits. Patchy T2/FLAIR hyperintensity involving the periventricular and deep white matter both cerebral hemispheres, most characteristic of chronic microvascular ischemic disease, mild in nature. No visible foci of restricted diffusion to suggest acute or subacute ischemia. No areas of chronic cortical infarction. No acute or chronic intracranial blood products. No mass lesion, midline shift or mass effect. No hydrocephalus or extra-axial fluid collection. Pituitary gland within normal limits. Vascular: Major intracranial vascular flow voids are maintained. Skull and upper cervical spine: Cranial junction within normal limits. Bone marrow signal intensity normal. No scalp soft tissue abnormality. Sinuses/Orbits: Postoperative changes noted about the left globe. Globes orbital soft tissues demonstrate no acute abnormality. Paranasal sinuses are largely clear. No significant mastoid effusion. Other: None. IMPRESSION: 1. Motion degraded exam. 2. No acute intracranial abnormality. 3. Mild chronic microvascular ischemic disease for age. Electronically Signed   By: Virgia Griffins M.D.   On: 12/25/2023 21:28   CT Head Wo Contrast Result Date: 12/25/2023 CLINICAL DATA:  Mental status change, unknown cause EXAM: CT HEAD WITHOUT CONTRAST TECHNIQUE: Contiguous axial images were obtained from the base of the skull through the vertex without intravenous contrast. RADIATION DOSE REDUCTION: This exam was performed according to the departmental dose-optimization program which includes automated exposure control, adjustment of the mA and/or kV according to patient size and/or use of iterative reconstruction technique. COMPARISON:  April 02, 2023 FINDINGS: Brain: Proportional prominence of the ventricles and sulci, consistent with diffuse cerebral parenchymal volume loss. The ventricles otherwise maintained  midline position without midline shift. Gray-white differentiation is preserved without focal attenuation abnormality.No evidence of acute territorial infarction, extra-axial fluid collection, hemorrhage, or mass lesion. The basilar cisterns are patent without downward herniation. The cerebellar hemispheres and vermis are well formed without mass lesion or focal attenuation abnormality. Vascular: No hyperdense vessel. Skull: Normal. Negative for fracture or focal lesion. Sinuses/Orbits: Significant leftward deviation of the nasal septum. The paranasal sinuses and mastoids are clear.The globes appear intact. No retrobulbar hematoma.Postsurgical changes of the left globe with scleral band in place. Dislocated lens layering dependently. Other: None. IMPRESSION: No acute intracranial abnormality, specifically, no acute hemorrhage, territorial infarction, or intracranial mass.s Electronically Signed   By: Rance Burrows M.D.   On: 12/25/2023 15:32     Signature  -   Lynnwood Sauer M.D on 12/27/2023 at 10:00 AM   -  To page go to [email or web address]

## 2023-12-27 NOTE — Progress Notes (Signed)
 EEG complete - results pending

## 2023-12-27 NOTE — Plan of Care (Signed)
  Problem: Education: Goal: Knowledge of General Education information will improve Description: Including pain rating scale, medication(s)/side effects and non-pharmacologic comfort measures Outcome: Progressing   Problem: Health Behavior/Discharge Planning: Goal: Ability to manage health-related needs will improve Outcome: Progressing   Problem: Clinical Measurements: Goal: Will remain free from infection Outcome: Progressing Goal: Diagnostic test results will improve Outcome: Progressing Goal: Cardiovascular complication will be avoided Outcome: Progressing   Problem: Activity: Goal: Risk for activity intolerance will decrease Outcome: Progressing   Problem: Coping: Goal: Level of anxiety will decrease Outcome: Progressing   Problem: Skin Integrity: Goal: Risk for impaired skin integrity will decrease Outcome: Progressing

## 2023-12-27 NOTE — Procedures (Signed)
 History: 58 year old male being evaluated for encephalopathy, rule out seizures  EEG Duration: 22 minutes  Sedation: None  Patient State: Awake and drowsy  Technique: This EEG was acquired with electrodes placed according to the International 10-20 electrode system (including Fp1, Fp2, F3, F4, C3, C4, P3, P4, O1, O2, T3, T4, T5, T6, A1, A2, Fz, Cz, Pz). The following electrodes were missing or displaced: none.   Background: There is a posterior dominant rhythm of 8 Hz which is moderately well-sustained.  In addition, there is generalized irregular delta and theta range activity intruding into the background throughout the study.  Sleep was not recorded.  There were no epileptiform discharges seen.  Photic stimulation: Physiologic driving is not performed  EEG Abnormalities: 1) generalized irregular slow activity  Clinical Interpretation: This EEG is consistent with a generalized non-specific cerebral dysfunction(encephalopathy). There was no seizure or seizure predisposition recorded on this study. Please note that lack of epileptiform activity on EEG does not preclude the possibility of epilepsy.   Ann Keto, MD [facility]   If 7pm- 7am, please page neurology on call as listed in [facility].

## 2023-12-27 NOTE — Evaluation (Signed)
 Physical Therapy Evaluation Patient Details Name: Jeffrey Hodges MRN: [id number] DOB: [birth year] Today's Date: 12/27/2023  History of Present Illness  58 y.o. male brought into the hospital 6/12 due to less responsiveness and abnormal behavior. Patient was admitted with hepatic encephalopathy. PMH: heart disease status post stent, heavy alcohol abuse, anemia, AKI, depression anxiety, elevated LFTs, hyperlipidemia, tobacco abuse, essential hypertension.   Clinical Impression  Pt admitted with above diagnosis. Wife reports pt with multiple falls PTA, does not use assistive device. Currently pt requires up to Mod assist to mobilize. Multiple episodes of loss of balance with RW for support, ambulating with ataxic gait pattern, erratic control of assistive device. Oriented to self only, and able to recall that he is in the hospital when provided 3 options for location. Patient will benefit from continued inpatient follow up therapy, <3 hours/day. Will progress as able during acute admission and update recs as appropriate. Pt currently with functional limitations due to the deficits listed below (see PT Problem List). Pt will benefit from acute skilled PT to increase their independence and safety with mobility to allow discharge.           If plan is discharge home, recommend the following: A lot of help with walking and/or transfers;A lot of help with bathing/dressing/bathroom;Assistance with cooking/housework;Direct supervision/assist for medications management;Direct supervision/assist for financial management;Help with stairs or ramp for entrance;Assist for transportation;Supervision due to cognitive status   Can travel by private vehicle   Yes    Equipment Recommendations Other (comment) (TBD)  Recommendations for Other Services       Functional Status Assessment Patient has had a recent decline in their functional status and demonstrates the ability to make significant improvements in  function in a reasonable and predictable amount of time.     Precautions / Restrictions Precautions Precautions: Fall Restrictions Weight Bearing Restrictions Per Provider Order: No      Mobility  Bed Mobility Overal bed mobility: Needs Assistance Bed Mobility: Rolling, Sidelying to Sit, Sit to Supine Rolling: Contact guard assist, Used rails Sidelying to sit: Min assist, Used rails   Sit to supine: Contact guard assist   General bed mobility comments: Min assist to rise to EOB, pulls through therapist's hand. Max cues for rolling and sequencing.    Transfers Overall transfer level: Needs assistance Equipment used: Rolling walker (2 wheels) Transfers: Sit to/from Stand Sit to Stand: Min assist           General transfer comment: Min assist for boost and balance. Cues for safety and awareness with set-up and RW postion, as he pushed it away several times.    Ambulation/Gait Ambulation/Gait assistance: Mod assist Gait Distance (Feet): 60 Feet Assistive device: Rolling walker (2 wheels) Gait Pattern/deviations: Step-through pattern, Decreased stride length, Staggering left, Staggering right, Drifts right/left, Wide base of support, Ataxic, Steppage Gait velocity: dec Gait velocity interpretation: <1.31 ft/sec, indicative of household ambulator   General Gait Details: Ataxic with erratic RW control requiring up to mod assist for balance and control of AD. Educated on safety, awareness, and techniques for maximizing stability in RW. Appears to demonstrate steppage gait, possible bil drop foot, noticed more on Lt than Rt.  Stairs            Wheelchair Mobility     Tilt Bed    Modified Rankin (Stroke Patients Only)       Balance Overall balance assessment: Needs assistance Sitting-balance support: No upper extremity supported, Feet supported Sitting balance-Leahy Scale: Fair  Standing balance support: Bilateral upper extremity supported, Reliant on  assistive device for balance Standing balance-Leahy Scale: Poor                               Pertinent Vitals/Pain Pain Assessment Pain Assessment: No/denies pain    Home Living Family/patient expects to be discharged to:: Private residence Living Arrangements: Spouse/significant other Available Help at Discharge: Family;Available 24 hours/day Type of Home: House Home Access: Stairs to enter Entrance Stairs-Rails: Doctor, general practice of Steps: 3 Alternate Level Stairs-Number of Steps: 13 Home Layout: Two level Home Equipment: Shower seat - built in      Prior Function Prior Level of Function : Independent/Modified Independent;History of Falls (last six months)             Mobility Comments: ind, multiple falls ADLs Comments: Ind, doesn't clean     Extremity/Trunk Assessment   Upper Extremity Assessment Upper Extremity Assessment: Defer to OT evaluation    Lower Extremity Assessment Lower Extremity Assessment: Generalized weakness;Difficult to assess due to impaired cognition       Communication   Communication Communication: No apparent difficulties    Cognition Arousal: Alert Behavior During Therapy: Flat affect   PT - Cognitive impairments: Orientation, Awareness, Memory, Attention, Initiation, Sequencing, Problem solving, Safety/Judgement   Orientation impairments: Place, Time, Situation                   PT - Cognition Comments: Unable to state correct year, onth, or situation. Able to correctly identify he is in the hospital when provided 3 options for location. Following commands: Intact       Cueing Cueing Techniques: Verbal cues, Gestural cues, Tactile cues     General Comments General comments (skin integrity, edema, etc.): VSS throughout session on RA. Reapplied [place] at end of session. Spoke with wife over the phone while other family present in room. he was incontinent of stool  prior to getting OOB> Able to  stand with RW support while therapist assisted with pericare.    Exercises     Assessment/Plan    PT Assessment Patient needs continued PT services  PT Problem List Decreased strength;Decreased range of motion;Decreased activity tolerance;Decreased balance;Decreased mobility;Decreased cognition;Decreased coordination;Decreased knowledge of use of DME;Decreased knowledge of precautions;Decreased safety awareness;Cardiopulmonary status limiting activity       PT Treatment Interventions DME instruction;Stair training;Gait training;Functional mobility training;Therapeutic exercise;Therapeutic activities;Balance training;Neuromuscular re-education;Cognitive remediation;Patient/family education    PT Goals (Current goals can be found in the Care Plan section)  Acute Rehab PT Goals Patient Stated Goal: go home PT Goal Formulation: With patient/family Time For Goal Achievement: 01/10/24 Potential to Achieve Goals: Good    Frequency Min 3X/week     Co-evaluation               AM-PAC PT 6 Clicks Mobility  Outcome Measure Help needed turning from your back to your side while in a flat bed without using bedrails?: A Little Help needed moving from lying on your back to sitting on the side of a flat bed without using bedrails?: A Lot Help needed moving to and from a bed to a chair (including a wheelchair)?: A Lot Help needed standing up from a chair using your arms (e.g., wheelchair or bedside chair)?: A Lot Help needed to walk in hospital room?: A Lot Help needed climbing 3-5 steps with a railing? : A Lot 6 Click Score: 13    End  of Session Equipment Utilized During Treatment: Gait belt;Oxygen Activity Tolerance: Patient tolerated treatment well Patient left: in bed;with call bell/phone within reach;with bed alarm set (Refused OOB to chair.)   PT Visit Diagnosis: Unsteadiness on feet (R26.81);Other abnormalities of gait and mobility (R26.89);Other symptoms and signs involving the  nervous system (R29.898);Ataxic gait (R26.0);History of falling (Z91.81);Muscle weakness (generalized) (M62.81);Repeated falls (R29.6)    Time: 2956-2130 PT Time Calculation (min) (ACUTE ONLY): 23 min   Charges:   PT Evaluation $PT Eval Low Complexity: 1 Low PT Treatments $Gait Training: 8-22 mins PT General Charges $$ ACUTE PT VISIT: 1 Visit         Jory Ng, PT, DPT [facility]  Rehabilitation Services Physical Therapist Office: [phone] Website: [email or web address]   Alinda Irani 12/27/2023, 10:25 AM

## 2023-12-27 NOTE — Evaluation (Signed)
 Clinical/Bedside Swallow Evaluation Patient Details  Name: Jeffrey Hodges MRN: [id number] Date of Birth: August 18, 1965  Today's Date: 12/27/2023 Time: SLP Start Time (ACUTE ONLY): 1423 SLP Stop Time (ACUTE ONLY): 1454 SLP Time Calculation (min) (ACUTE ONLY): 31 min  Past Medical History:  Past Medical History:  Diagnosis Date   Anxiety    Coronary artery disease    Depression    Hyperlipidemia 09/09/2022   Hypertension    Past Surgical History:  Past Surgical History:  Procedure Laterality Date   CORONARY STENT INTERVENTION N/A 09/11/2022   Procedure: CORONARY STENT INTERVENTION;  Surgeon: Thukkani, Arun K, MD;  Location: [facility] INVASIVE CV LAB;  Service: Cardiovascular;  Laterality: N/A;   EYE SURGERY Left 1989   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 male brought into the hospital 6/12 due to less responsiveness and abnormal behavior. Patient was admitted with hepatic encephalopathy. PMH: heart disease status post stent, heavy alcohol abuse, anemia, AKI, depression anxiety, elevated LFTs, hyperlipidemia, tobacco abuse, essential hypertension.    Assessment / Plan / Recommendation  Clinical Impression  Pt's swallow function appears to be impacted by reduced mental status. Pt aroused following verbal cues, but with frequent fleeting attention and lethargy throughout eval. Oral mechanism examination WFL, missing mandibular dentition noted. Pt and wife, present at bedside, report no difficulties consuming a regular diet at baseline. Pt actively consumed ice chips x2 with palpable swallow noted, no s/sx of aspiration to follow. Hand-over-hand assist for straw sips of thin liquids (single and sequential) were significant for mild throat clearing x1 and delayed  overt coughing across all attempts. RN also reports pt coughing after taking meds x1 at a time with sips of water. He declined puree and dys 3 solid, but self-fed graham cracker with prolonged mastication/clearance and reduced awareness exhibited. Given clinical presentation and current mentation, there is a concern for aspiration risk. Recommend NPO with ice chips PRN after oral care when pt is alert. Can trial critical meds in puree or with small sips of thin if needed. Will f/u for PO readiness and determine need for instrumental assessment.  SLP Visit Diagnosis: Dysphagia, unspecified (R13.10)    Aspiration Risk       Diet Recommendation NPO;NPO except meds;Ice chips PRN after oral care    Liquid Administration via: Spoon Medication Administration: Whole meds with puree Supervision: Full supervision/cueing for compensatory strategies;Staff to assist with self feeding Compensations: Minimize environmental distractions;Slow rate;Small sips/bites Postural Changes: Seated upright at 90 degrees    Other  Recommendations Oral Care Recommendations: Oral care QID;Oral care prior to ice chip/H20     Assistance Recommended at Discharge    Functional Status Assessment Patient has had a recent decline in their functional status and demonstrates the ability to make significant improvements in function in a reasonable and predictable amount of time.  Frequency and Duration min 2x/week  2 weeks       Prognosis Prognosis for improved oropharyngeal function: Good Barriers to Reach Goals: Cognitive deficits      Swallow Study   General Date of Onset: 12/25/23 HPI: 58 y.o. male brought into the hospital 6/12 due to less responsiveness and abnormal behavior. Patient was admitted with hepatic encephalopathy. PMH: heart disease status post stent, heavy alcohol abuse, anemia,  AKI, depression anxiety, elevated LFTs, hyperlipidemia, tobacco abuse, essential hypertension. Type of Study: Bedside Swallow  Evaluation Previous Swallow Assessment: none per EMR Diet Prior to this Study:  (soft diet, thin liquids) Temperature Spikes Noted: No Respiratory Status: Nasal cannula History of Recent Intubation: No Behavior/Cognition: Lethargic/Drowsy;Uncooperative;Requires cueing Oral Cavity Assessment: Within Functional Limits Oral Care Completed by SLP: No Oral Cavity - Dentition: Missing dentition Vision: Functional for self-feeding Self-Feeding Abilities: Needs assist Patient Positioning: Upright in bed;Postural control adequate for testing Baseline Vocal Quality: Normal Volitional Cough: Strong Volitional Swallow: Able to elicit    Oral/Motor/Sensory Function Overall Oral Motor/Sensory Function: Within functional limits   Ice Chips Ice chips: Within functional limits Presentation: Spoon   Thin Liquid Thin Liquid: Impaired Presentation: Straw;Self Fed Pharyngeal  Phase Impairments: Suspected delayed Swallow;Multiple swallows;Cough - Delayed;Throat Clearing - Immediate    Nectar Thick Nectar Thick Liquid: Not tested   Honey Thick Honey Thick Liquid: Not tested   Puree Puree: Not tested Other Comments: pt declined   Solid     Solid: Impaired Presentation: Self Fed Oral Phase Impairments: Impaired mastication;Poor awareness of bolus Oral Phase Functional Implications: Impaired mastication;Prolonged oral transit       Gordon Latus, MA, CCC-SLP Acute Rehabilitation Services Office Number: [phone]  Corliss Dies 12/27/2023,3:15 PM

## 2023-12-27 NOTE — Progress Notes (Signed)
  Echocardiogram 2D Echocardiogram has been performed.  Farley Honer, RDCS 12/27/2023, 2:12 PM

## 2023-12-28 DIAGNOSIS — R5383 Other fatigue: Secondary | ICD-10-CM | POA: Diagnosis not present

## 2023-12-28 LAB — PROCALCITONIN: Procalcitonin: 0.48 ng/mL

## 2023-12-28 LAB — CBC WITH DIFFERENTIAL/PLATELET
Abs Immature Granulocytes: 0.05 10*3/uL (ref 0.00–0.07)
Basophils Absolute: 0 10*3/uL (ref 0.0–0.1)
Basophils Relative: 0 %
Eosinophils Absolute: 0.2 10*3/uL (ref 0.0–0.5)
Eosinophils Relative: 2 %
HCT: 35 % — ABNORMAL LOW (ref 39.0–52.0)
Hemoglobin: 11.6 g/dL — ABNORMAL LOW (ref 13.0–17.0)
Immature Granulocytes: 1 %
Lymphocytes Relative: 15 %
Lymphs Abs: 1.5 10*3/uL (ref 0.7–4.0)
MCH: 34.9 pg — ABNORMAL HIGH (ref 26.0–34.0)
MCHC: 33.1 g/dL (ref 30.0–36.0)
MCV: 105.4 fL — ABNORMAL HIGH (ref 80.0–100.0)
Monocytes Absolute: 0.8 10*3/uL (ref 0.1–1.0)
Monocytes Relative: 8 %
Neutro Abs: 7.4 10*3/uL (ref 1.7–7.7)
Neutrophils Relative %: 74 %
Platelets: 136 10*3/uL — ABNORMAL LOW (ref 150–400)
RBC: 3.32 MIL/uL — ABNORMAL LOW (ref 4.22–5.81)
RDW: 19.3 % — ABNORMAL HIGH (ref 11.5–15.5)
WBC: 9.9 10*3/uL (ref 4.0–10.5)
nRBC: 0 % (ref 0.0–0.2)

## 2023-12-28 LAB — COMPREHENSIVE METABOLIC PANEL WITH GFR
ALT: 44 U/L (ref 0–44)
AST: 79 U/L — ABNORMAL HIGH (ref 15–41)
Albumin: 1.5 g/dL — ABNORMAL LOW (ref 3.5–5.0)
Alkaline Phosphatase: 79 U/L (ref 38–126)
Anion gap: 13 (ref 5–15)
BUN: 10 mg/dL (ref 6–20)
CO2: 26 mmol/L (ref 22–32)
Calcium: 7.8 mg/dL — ABNORMAL LOW (ref 8.9–10.3)
Chloride: 98 mmol/L (ref 98–111)
Creatinine, Ser: 0.59 mg/dL — ABNORMAL LOW (ref 0.61–1.24)
GFR, Estimated: >60 mL/min (ref >60)
Glucose, Bld: 117 mg/dL — ABNORMAL HIGH (ref 70–99)
Potassium: 3.4 mmol/L — ABNORMAL LOW (ref 3.5–5.1)
Sodium: 137 mmol/L (ref 135–145)
Total Bilirubin: 1.2 mg/dL (ref 0.0–1.2)
Total Protein: 5.7 g/dL — ABNORMAL LOW (ref 6.5–8.1)

## 2023-12-28 LAB — PROTIME-INR
INR: 1.2 (ref 0.8–1.2)
Prothrombin Time: 15.8 s — ABNORMAL HIGH (ref 11.4–15.2)

## 2023-12-28 LAB — C-REACTIVE PROTEIN: CRP: 2.7 mg/dL — ABNORMAL HIGH (ref <1.0)

## 2023-12-28 LAB — PHOSPHORUS: Phosphorus: 3 mg/dL (ref 2.5–4.6)

## 2023-12-28 LAB — MAGNESIUM: Magnesium: 2 mg/dL (ref 1.7–2.4)

## 2023-12-28 LAB — BRAIN NATRIURETIC PEPTIDE: B Natriuretic Peptide: 663.6 pg/mL — ABNORMAL HIGH (ref 0.0–100.0)

## 2023-12-28 MED ORDER — CHLORHEXIDINE GLUCONATE CLOTH 2 % EX PADS
6.0000 | MEDICATED_PAD | Freq: Every day | CUTANEOUS | Status: DC
  Administered 2023-12-28 – 2024-01-05 (×9): 6 via TOPICAL

## 2023-12-28 MED ORDER — LIDOCAINE HCL URETHRAL/MUCOSAL 2 % EX GEL
1.0000 | Freq: Once | CUTANEOUS | Status: AC
  Administered 2023-12-28: 1 via URETHRAL
  Filled 2023-12-28: qty 6

## 2023-12-28 MED ORDER — LORAZEPAM 2 MG/ML IJ SOLN
1.0000 mg | Freq: Once | INTRAMUSCULAR | Status: AC
  Administered 2023-12-28: 1 mg via INTRAVENOUS

## 2023-12-28 MED ORDER — POTASSIUM CHLORIDE CRYS ER 20 MEQ PO TBCR
40.0000 meq | EXTENDED_RELEASE_TABLET | Freq: Once | ORAL | Status: AC
  Administered 2023-12-28: 40 meq via ORAL
  Filled 2023-12-28: qty 2

## 2023-12-28 MED ORDER — CHLORDIAZEPOXIDE HCL 5 MG PO CAPS
5.0000 mg | ORAL_CAPSULE | Freq: Two times a day (BID) | ORAL | Status: DC
  Administered 2023-12-28 – 2023-12-29 (×3): 5 mg via ORAL
  Filled 2023-12-28 (×2): qty 1

## 2023-12-28 MED ORDER — TAMSULOSIN HCL 0.4 MG PO CAPS
0.4000 mg | ORAL_CAPSULE | Freq: Every day | ORAL | Status: DC
  Administered 2023-12-28 – 2024-01-07 (×11): 0.4 mg via ORAL
  Filled 2023-12-28 (×11): qty 1

## 2023-12-28 MED ORDER — THIAMINE HCL 100 MG/ML IJ SOLN
500.0000 mg | Freq: Every day | INTRAVENOUS | Status: DC
  Administered 2023-12-28 – 2024-01-07 (×11): 500 mg via INTRAVENOUS
  Filled 2023-12-28 (×11): qty 5

## 2023-12-28 MED ORDER — LORAZEPAM 2 MG/ML IJ SOLN
INTRAMUSCULAR | Status: AC
  Filled 2023-12-28: qty 1

## 2023-12-28 NOTE — NC FL2 (Signed)
 [place]  MEDICAID FL2 LEVEL OF CARE FORM     IDENTIFICATION  Patient Name: Jeffrey Hodges Birthdate: Mar 12, 1966 Sex: male Admission Date (Current Location): 12/25/2023  [facility] and [place] Number:  Producer, television/film/video and Address:         Provider Number: [id number]  Attending Physician Name and Address:  Cala Castleman, MD  Relative Name and Phone Number:  Tayron, Hunnell  [phone]    Current Level of Care: SNF Recommended Level of Care:   Prior Approval Number:    Date Approved/Denied: 12/28/23 PASRR Number: [id number]  Discharge Plan: SNF    Current Diagnoses: Patient Active Problem List   Diagnosis Date Noted   Acute hepatic encephalopathy (HCC) 12/26/2023   Lethargy 12/25/2023   Elevated LFTs 04/14/2023   Hospital discharge follow-up 04/14/2023   AKI (acute kidney injury) (HCC) 04/03/2023   Thrombocytopenia (HCC) 04/03/2023   Anemia 04/03/2023   Transaminitis 04/03/2023   Syncope and collapse 04/02/2023   History of CAD (coronary artery disease) 04/02/2023   Night sweats 10/01/2022   Nausea and vomiting 10/01/2022   Tobacco use 09/12/2022   NSTEMI (non-ST elevated myocardial infarction) (HCC) 09/10/2022   Hyperlipidemia 09/09/2022   Chest discomfort 09/09/2022   Generalized anxiety disorder with panic attacks 04/12/2022   Depression, major, single episode, moderate (HCC) 04/12/2022   Preventative health care 12/04/2021   Primary hypertension 12/04/2021   Anxiety and depression 12/04/2021   Overweight (BMI 25.0-29.9) 12/04/2021    Orientation RESPIRATION BLADDER Height & Weight     Self  O2 Continent Weight: 185 lb 10 oz (84.2 kg) Height:  6' (182.9 cm)  BEHAVIORAL SYMPTOMS/MOOD NEUROLOGICAL BOWEL NUTRITION STATUS      Continent Diet  AMBULATORY STATUS COMMUNICATION OF NEEDS Skin   Limited Assist Verbally Normal                       Personal Care Assistance Level of Assistance              Functional Limitations  Info  Speech     Speech Info: Impaired    SPECIAL CARE FACTORS FREQUENCY  PT (By licensed PT), OT (By licensed OT)     PT Frequency: 5x OT Frequency: 3x            Contractures      Additional Factors Info  Code Status Code Status Info: Full             Current Medications (12/28/2023):  This is the current hospital active medication list Current Facility-Administered Medications  Medication Dose Route Frequency Provider Last Rate Last Admin   aspirin  EC tablet 81 mg  81 mg Oral Daily Pahwani, Ravi, MD   81 mg at 12/28/23 1001   atorvastatin  (LIPITOR) tablet 80 mg  80 mg Oral Daily Pahwani, Ravi, MD   80 mg at 12/28/23 1001   chlordiazePOXIDE (LIBRIUM) capsule 5 mg  5 mg Oral BID Singh, Prashant K, MD   5 mg at 12/28/23 1001   FLUoxetine  (PROZAC ) capsule 40 mg  40 mg Oral Daily Pahwani, Ravi, MD   40 mg at 12/28/23 1001   folic acid  (FOLVITE ) tablet 1 mg  1 mg Oral Daily Kehrli, Kelsey F, PA-C   1 mg at 12/28/23 1127   heparin  injection 5,000 Units  5,000 Units Subcutaneous Q8H Singh, Prashant K, MD   5,000 Units at 12/28/23 0622   lactulose  (CHRONULAC ) 10 GM/15ML solution 30 g  30 g Oral TID Pahwani,  Martha Slack, MD   30 g at 12/28/23 0029   lactulose  (CHRONULAC ) enema 200 gm  300 mL Rectal Once Patel, Ekta V, MD       LORazepam  (ATIVAN ) tablet 1-4 mg  1-4 mg Oral Q1H PRN Kehrli, Kelsey F, PA-C   1 mg at 12/25/23 1652   Or   LORazepam  (ATIVAN ) injection 1-4 mg  1-4 mg Intravenous Q1H PRN Kehrli, Kelsey F, PA-C       multivitamin with minerals tablet 1 tablet  1 tablet Oral Daily Kehrli, Kelsey F, PA-C   1 tablet at 12/28/23 1001   nicotine  (NICODERM CQ  - dosed in mg/24 hours) patch 21 mg  21 mg Transdermal Daily Patel, Ekta V, MD   21 mg at 12/28/23 1002   nitroGLYCERIN  (NITROSTAT ) SL tablet 0.4 mg  0.4 mg Sublingual Q5 Min x 3 PRN Pahwani, Ravi, MD       pantoprazole (PROTONIX) EC tablet 40 mg  40 mg Oral Daily Singh, Prashant K, MD   40 mg at 12/28/23 1001   sodium chloride   flush (NS) 0.9 % injection 3-10 mL  3-10 mL Intravenous PRN Lavanda Porter, MD       thiamine  (VITAMIN B1) 500 mg in sodium chloride  0.9 % 50 mL IVPB  500 mg Intravenous Daily Singh, Prashant K, MD 110 mL/hr at 12/28/23 1000 500 mg at 12/28/23 1000     Discharge Medications: Please see discharge summary for a list of discharge medications.  Relevant Imaging Results:  Relevant Lab Results:   Additional Information SSN: [id number]  Hilda Lovings, LCSW

## 2023-12-28 NOTE — Plan of Care (Signed)

## 2023-12-28 NOTE — Progress Notes (Signed)
 PROGRESS NOTE                                                                                                                                                                                                             Patient Demographics:    Jeffrey Hodges, is a 58 y.o. male, DOB - 20-Dec-1965, ZOX:[id number]  Outpatient Primary MD for the patient is Dorothe Gaster, NP    LOS - 2  Admit date - 12/25/2023    Chief Complaint  Patient presents with   Alcohol Problem       Brief Narrative (HPI from H&P)   58 y.o. male with past medical history  of heart disease status post stent, heavy alcohol abuse, anemia, AKI, depression anxiety, elevated LFTs, hyperlipidemia, tobacco abuse, essential hypertension, was brought into the hospital due to less responsiveness and abnormal behavior.  Patient sees GI and cardiology.  It is being suspected that patient drinks 2 bottles of liquor essentially every day.  Patient was admitted with hepatic encephalopathy.    Subjective:   Patient in bed, appears comfortable, denies any headache, no fever, no chest pain or pressure, no shortness of breath , no abdominal pain. No new focal weakness.   Assessment  & Plan :   Acute hepatic encephalopathy: Patient was admitted with ammonia levels of around 49 which is close to his baseline, ammonia levels have trended down, question if he is developing Wernicke's encephalopathy, placed on high-dose IV thiamine ,   Continue IV fluids, PT and speech eval and monitor.  Stable MRI brain, EEG, RPR TSH and B12 stable.  No headache or focal deficits, check thiamine  levels.   Alcohol dependence: Consult to quit, CIWA protocol and Librium.   Hypokalemia, hypophosphatemia: Replaced  Alcoholic hepatitis/elevated LFTs: Mild, continue to monitor, INR is relatively stable.   Chronic hyponatremia: Baseline low 130s.  Currently at baseline.  Secondary to alcoholism.    History of CAD status post stent, elevated troponin, it was mild and nonspecific: Stent placed in February 2024.  Troponin elevated but flat.  Patient with no ACS symptoms.  Likely demand ischemia.  Looks like he was on DAPT for a year after cath.  Continue aspirin , echocardiogram is nonacute with preserved EF of 55% and no wall motion abnormality.   Anxiety/depression: Resuming Prozac  but holding BuSpar  for now.   Hyperlipidemia:  Resume statin.  Looks like he is not taking at home.         Condition - Extremely Guarded  Family Communication  : Parents bedside on 12/27/2023, wife [name] over the  [phone]  12/27/2023, 12/28/2023  Code Status : Full code  Consults  :  None  PUD Prophylaxis :  PPI   Procedures  :     TTE - 1. Left ventricular ejection fraction, by estimation, is 55 to 60%. The left ventricle has normal function. The left ventricle has no regional wall motion abnormalities. There is mild left ventricular hypertrophy. Left ventricular diastolic parameters were normal.  2. Right ventricular systolic function is normal. The right ventricular size is normal. Tricuspid regurgitation signal is inadequate for assessing PA pressure.  3. The mitral valve is grossly normal. Trivial mitral valve regurgitation. No evidence of mitral stenosis.  4. The aortic valve is grossly normal. Aortic valve regurgitation is not visualized. No aortic stenosis is present.  5. The inferior vena cava is normal in size with <50% respiratory variability, suggesting right atrial pressure of 8 mmHg.  EEG - no active seizures.  MRI - 1. Motion degraded exam. 2. No acute intracranial abnormality. 3. Mild chronic microvascular ischemic disease for age.      Disposition Plan  :    Status is: Inpatient    DVT Prophylaxis  :  Heparin   heparin  injection 5,000 Units Start: 12/27/23 1400 SCDs Start: 12/25/23 1909    Lab Results  Component Value Date   PLT 136 (L) 12/28/2023    Diet :  Diet  Order             Diet NPO time specified Except for: Sips with Meds  Diet effective now                    Inpatient Medications  Scheduled Meds:  aspirin  EC  81 mg Oral Daily   atorvastatin   80 mg Oral Daily   chlordiazePOXIDE  5 mg Oral BID   FLUoxetine   40 mg Oral Daily   folic acid   1 mg Oral Daily   heparin  injection (subcutaneous)  5,000 Units Subcutaneous Q8H   lactulose   30 g Oral TID   lactulose   300 mL Rectal Once   multivitamin with minerals  1 tablet Oral Daily   nicotine   21 mg Transdermal Daily   pantoprazole  40 mg Oral Daily   Continuous Infusions:  dextrose 5 % and 0.45 % NaCl 75 mL/hr at 12/28/23 0040   thiamine  (VITAMIN B1) injection     PRN Meds:.LORazepam  **OR** LORazepam , nitroGLYCERIN , sodium chloride  flush  Antibiotics  :    Anti-infectives (From admission, onward)    None         Objective:   Vitals:   12/27/23 1941 12/27/23 2358 12/28/23 0426 12/28/23 0824  BP: (!) 149/86 (!) 147/88 (!) 140/90 131/84  Pulse: 100 99 99 90  Resp: 19 19 16 16   Temp: 98 F (36.7 C) 98 F (36.7 C) 98.1 F (36.7 C) 97.7 F (36.5 C)  TempSrc: Oral Oral Oral Oral  SpO2: 95% 92% 95% 93%  Weight:      Height:        Wt Readings from Last 3 Encounters:  12/27/23 84.2 kg  10/20/23 90.3 kg  05/13/23 89.9 kg     Intake/Output Summary (Last 24 hours) at 12/28/2023 0910 Last data filed at 12/27/2023 1942 Gross per 24 hour  Intake --  Output 0  ml  Net 0 ml     Physical Exam  Awake but confused, No new F.N deficits, Normal affect [place].AT,PERRAL Supple Neck, No JVD,   Symmetrical Chest wall movement, Good air movement bilaterally, CTAB RRR,No Gallops,Rubs or new Murmurs,  +ve B.Sounds, Abd Soft, No tenderness,   No Cyanosis, Clubbing or edema       Data Review:    Recent Labs  Lab 12/25/23 1338 12/26/23 0717 12/27/23 0735 12/28/23 0749  WBC 8.3 9.8 11.1* 9.9  HGB 13.9 13.0 12.0* 11.6*  HCT 39.9 37.8* 36.0* 35.0*  PLT 161 157 149*  136*  MCV 98.0 100.0 100.6* 105.4*  MCH 34.2* 34.4* 33.5 34.9*  MCHC 34.8 34.4 33.3 33.1  RDW 18.3* 18.2* 18.6* 19.3*  LYMPHSABS 1.3  --  1.3 1.5  MONOABS 0.7  --  0.9 0.8  EOSABS 0.1  --  0.2 0.2  BASOSABS 0.0  --  0.0 0.0    Recent Labs  Lab 12/25/23 1338 12/25/23 1531 12/25/23 1912 12/26/23 0717 12/26/23 1200 12/26/23 1651 12/27/23 0735  NA 131*  --   --  132*  --   --  136  K 2.8*  --   --  2.5*  --   --  3.0*  CL 81*  --   --  85*  --   --  93*  CO2 35*  --   --  35*  --   --  32  ANIONGAP 15  --   --  12  --   --  11  GLUCOSE 118*  --   --  97  --   --  113*  BUN 9  --   --  12  --   --  13  CREATININE 0.93  --   --  0.68  --   --  0.66  AST 142*  --   --  116*  --   --  99*  ALT 55*  --   --  53*  --   --  50*  ALKPHOS 94  --   --  88  --   --  78  BILITOT 2.5*  --   --  2.0*  --   --  1.7*  ALBUMIN 1.8*  --   --  1.8*  --   --  1.7*  CRP  --   --   --   --   --   --  3.2*  PROCALCITON  --   --   --   --   --   --  0.47  INR 1.2  --   --   --   --   --   --   TSH  --   --  1.453  --   --   --   --   AMMONIA 49*  --   --   --   --  30  --   BNP 58.9  --   --   --   --   --  122.9*  MG  --  2.2  --   --  2.3  --  2.1  PHOS  --   --   --   --   --   --  1.9*  CALCIUM  7.9*  --   --  7.7*  --   --  8.1*      Recent Labs  Lab 12/25/23 1338 12/25/23 1531 12/25/23 1912 12/26/23 0717 12/26/23 1200 12/26/23 1651  12/27/23 0735  CRP  --   --   --   --   --   --  3.2*  PROCALCITON  --   --   --   --   --   --  0.47  INR 1.2  --   --   --   --   --   --   TSH  --   --  1.453  --   --   --   --   AMMONIA 49*  --   --   --   --  30  --   BNP 58.9  --   --   --   --   --  122.9*  MG  --  2.2  --   --  2.3  --  2.1  CALCIUM  7.9*  --   --  7.7*  --   --  8.1*    --------------------------------------------------------------------------------------------------------------- Lab Results  Component Value Date   CHOL 301 (H) 09/10/2022   HDL 78 09/10/2022   LDLCALC  207 (H) 09/10/2022   TRIG 82 09/10/2022   CHOLHDL 3.9 09/10/2022    Lab Results  Component Value Date   HGBA1C 5.4 09/10/2022   Recent Labs    12/25/23 1912 12/25/23 1913  TSH 1.453  --   FREET4  --  0.76   Recent Labs    12/25/23 1913 12/26/23 0717  VITAMINB12  --  700  FOLATE 4.5*  --    ------------------------------------------------------------------------------------------------------------------ Cardiac Enzymes No results for input(s): CKMB, TROPONINI, MYOGLOBIN in the last 168 hours.  Invalid input(s): CK  Micro Results No results found for this or any previous visit (from the past 240 hours).  Radiology Report ECHOCARDIOGRAM COMPLETE Result Date: 12/27/2023    ECHOCARDIOGRAM REPORT   Patient Name:   [name] Date of Exam: 12/27/2023 Medical Rec #:  [id number]     Height:       72.0 in Accession #:    [id number]    Weight:       185.6 lb Date of Birth:  [birth year]    BSA:          2.064 m Patient Age:    57 years      BP:           138/84 mmHg Patient Gender: M             HR:           99 bpm. Exam Location:  Inpatient Procedure: 2D Echo, Cardiac Doppler and Color Doppler (Both Spectral and Color            Flow Doppler were utilized during procedure). Indications:    Acute ischemic heart disease, unspecified I24.9  History:        Patient has prior history of Echocardiogram examinations, most                 recent 04/03/2023. CAD; Risk Factors:Hypertension, Dyslipidemia                 and Former Smoker.  Sonographer:    Kip Peon RDCS Referring Phys: Emelda Hane [facility]  Sonographer Comments: Suboptimal parasternal window. Image acquisition challenging due to uncooperative patient. IMPRESSIONS  1. Left ventricular ejection fraction, by estimation, is 55 to 60%. The left ventricle has normal function. The left ventricle has no regional wall motion abnormalities. There is mild left ventricular hypertrophy. Left ventricular diastolic parameters were  normal.  2.  Right ventricular systolic function is normal. The right ventricular size is normal. Tricuspid regurgitation signal is inadequate for assessing PA pressure.  3. The mitral valve is grossly normal. Trivial mitral valve regurgitation. No evidence of mitral stenosis.  4. The aortic valve is grossly normal. Aortic valve regurgitation is not visualized. No aortic stenosis is present.  5. The inferior vena cava is normal in size with <50% respiratory variability, suggesting right atrial pressure of 8 mmHg. FINDINGS  Left Ventricle: Left ventricular ejection fraction, by estimation, is 55 to 60%. The left ventricle has normal function. The left ventricle has no regional wall motion abnormalities. The left ventricular internal cavity size was normal in size. There is  mild left ventricular hypertrophy. Left ventricular diastolic parameters were normal. Right Ventricle: The right ventricular size is normal. No increase in right ventricular wall thickness. Right ventricular systolic function is normal. Tricuspid regurgitation signal is inadequate for assessing PA pressure. Left Atrium: Left atrial size was normal in size. Right Atrium: Right atrial size was normal in size. Pericardium: There is no evidence of pericardial effusion. Mitral Valve: The mitral valve is grossly normal. Trivial mitral valve regurgitation. No evidence of mitral valve stenosis. Tricuspid Valve: The tricuspid valve is normal in structure. Tricuspid valve regurgitation is trivial. No evidence of tricuspid stenosis. Aortic Valve: The aortic valve is grossly normal. Aortic valve regurgitation is not visualized. No aortic stenosis is present. Pulmonic Valve: The pulmonic valve was normal in structure. Pulmonic valve regurgitation is not visualized. No evidence of pulmonic stenosis. Aorta: The aortic root is normal in size and structure. Venous: The inferior vena cava is normal in size with less than 50% respiratory variability, suggesting right  atrial pressure of 8 mmHg. IAS/Shunts: No atrial level shunt detected by color flow Doppler.  LEFT VENTRICLE PLAX 2D LVIDd:         4.50 cm   Diastology LVIDs:         2.90 cm   LV e' medial:    8.05 cm/s LV PW:         1.10 cm   LV E/e' medial:  10.1 LV IVS:        0.90 cm   LV e' lateral:   12.30 cm/s LVOT diam:     1.80 cm   LV E/e' lateral: 6.6 LV SV:         56 LV SV Index:   27 LVOT Area:     2.54 cm  RIGHT VENTRICLE RV S prime:     18.95 cm/s LEFT ATRIUM             Index        RIGHT ATRIUM           Index LA diam:        3.70 cm 1.79 cm/m   RA Area:     13.60 cm LA Vol (A2C):   24.2 ml 11.72 ml/m  RA Volume:   28.90 ml  14.00 ml/m LA Vol (A4C):   43.8 ml 21.22 ml/m LA Biplane Vol: 36.2 ml 17.54 ml/m  AORTIC VALVE LVOT Vmax:   115.00 cm/s LVOT Vmean:  78.600 cm/s LVOT VTI:    0.222 m  AORTA Ao Root diam: 3.30 cm MITRAL VALVE MV Area (PHT): 4.49 cm    SHUNTS MV Decel Time: 169 msec    Systemic VTI:  0.22 m MV E velocity: 81.20 cm/s  Systemic Diam: 1.80 cm MV A velocity: 93.30 cm/s MV E/A ratio:  0.87  Grady Lawman MD Electronically signed by Grady Lawman MD Signature Date/Time: 12/27/2023/4:02:19 PM    Final      Signature  -   Lynnwood Sauer M.D on 12/28/2023 at 9:10 AM   -  To page go to [email or web address]

## 2023-12-28 NOTE — Progress Notes (Signed)
 Speech Language Pathology Treatment: Dysphagia  Patient Details Name: Jeffrey Hodges MRN: [id number] DOB: [birth year] Today's Date: 12/28/2023 Time: 2130-8657 SLP Time Calculation (min) (ACUTE ONLY): 26 min  Assessment / Plan / Recommendation Clinical Impression  Pt was seen for dysphagia treatment with his family present. Pt's RN reported that the pt has been asymptomatic with ice chips, water, and pills crushed in puree. Pt was alert, seated upright in the chair, and engaged in conversation during the session. Pt tolerated puree, regular texture solids, and thin liquids without overt s/s of aspiration. Mastication and oral clearance were WFL and pt's family reported that it appeared to be at baseline. A regular texture diet with thin liquids is recommended and SLP will follow to ensure tolerance.    HPI HPI: 58 y.o. male brought into the hospital 6/12 due to less responsiveness and abnormal behavior. Patient was admitted with hepatic encephalopathy. PMH: heart disease status post stent, heavy alcohol abuse, anemia, AKI, depression anxiety, elevated LFTs, hyperlipidemia, tobacco abuse, essential hypertension.      SLP Plan  Continue with current plan of care          Recommendations  Diet recommendations: Regular;Thin liquid Liquids provided via: Cup;Straw Medication Administration: Whole meds with puree (or crushed; as tolerated) Supervision: Staff to assist with self feeding Compensations: Minimize environmental distractions;Slow rate;Small sips/bites Postural Changes and/or Swallow Maneuvers: Seated upright 90 degrees                  Oral care QID;Oral care prior to ice chip/H20     Dysphagia, unspecified (R13.10)     Continue with current plan of care    Jeffrey Hodges I. Valda Garnet, MS, CCC-SLP Acute Rehabilitation Services Office number [phone]  Toya Friar  12/28/2023, 1:52 PM

## 2023-12-28 NOTE — TOC Initial Note (Signed)
 Transition of Care [facility]) - Initial/Assessment Note    Patient Details  Name: Jeffrey Hodges MRN: [id number] Date of Birth: [birth year]  [facility] CM/SW Contact:    Hilda Lovings, LCSW Phone Number: 12/28/2023, 12:06 PM  Clinical Narrative:                 CSW followed-up disposition recommendations (SNF placement).  CSW completed initial TOC work-up/assessment as noted by the following below.   CSW spoke with the patient to review SNF referral process per clinical recommendations and assessed the pt's SNF preference.   The patient expressed no SN preference.   The CSW attempted to contact the patient's natural support Blayke, Cordrey Spouse [phone] ) to update them to the patient's disposition. CSW was unable to make contact with the natural support. CSW   CSW referral efforts to support the patient's disposition FL2:  PASRR:  SNF referrals:   TOC Disposition follow-up needs  Please provide the patient or natural support with bed-offer updates.  Please continue with SNF placement efforts. Please update the clinical team to SNF placement efforts:  No other needs identified by this Clinical research associate currently. Patient needs and current disposition to be followed by    Expected Discharge Plan: Skilled Nursing Facility Barriers to Discharge: Continued Medical Work up   Patient Goals and CMS Choice Patient states their goals for this hospitalization and ongoing recovery are:: To get better again          Expected Discharge Plan and Services In-house Referral: Clinical Social Work     Living arrangements for the past 2 months: Single Family Home                                      Prior Living Arrangements/Services Living arrangements for the past 2 months: Single Family Home Lives with:: Spouse Patient language and need for interpreter reviewed:: Yes Do you feel safe going back to the place where you live?: Yes      Need for Family Participation  in Patient Care: Yes (Comment) Care giver support system in place?: Yes (comment)   Criminal Activity/Legal Involvement Pertinent to Current Situation/Hospitalization: No - Comment as needed  Activities of Daily Living      Permission Sought/Granted Permission sought to share information with : Case Manager, Magazine features editor, Family Supports Permission granted to share information with : Yes, Verbal Permission Granted        Permission granted to share info w Relationship: Spouse  Permission granted to share info w Contact Information: [place], [place]  [phone]  Emotional Assessment       Orientation: : Oriented to Self Alcohol / Substance Use: Not Applicable Psych Involvement: No (comment)  Admission diagnosis:  Alcohol abuse [F10.10] Lethargy [R53.83] Encephalopathy acute [G93.40] Acute hepatic encephalopathy (HCC) [K76.82] Patient Active Problem List   Diagnosis Date Noted   Acute hepatic encephalopathy (HCC) 12/26/2023   Lethargy 12/25/2023   Elevated LFTs 04/14/2023   Hospital discharge follow-up 04/14/2023   AKI (acute kidney injury) (HCC) 04/03/2023   Thrombocytopenia (HCC) 04/03/2023   Anemia 04/03/2023   Transaminitis 04/03/2023   Syncope and collapse 04/02/2023   History of CAD (coronary artery disease) 04/02/2023   Night sweats 10/01/2022   Nausea and vomiting 10/01/2022   Tobacco use 09/12/2022   NSTEMI (non-ST elevated myocardial infarction) (HCC) 09/10/2022   Hyperlipidemia 09/09/2022   Chest discomfort 09/09/2022   Generalized  anxiety disorder with panic attacks 04/12/2022   Depression, major, single episode, moderate (HCC) 04/12/2022   Preventative health care 12/04/2021   Primary hypertension 12/04/2021   Anxiety and depression 12/04/2021   Overweight (BMI 25.0-29.9) 12/04/2021   PCP:  Dorothe Gaster, NP Pharmacy:   [facility] [phone] [place], [place] - [address]  [place] [address] Phone: [phone] Fax: [phone]  [facility] [address] [place] [address] Phone: [phone] Fax: [phone]     Social Drivers of Health (SDOH) Social History: SDOH Screenings   Food Insecurity: No Food Insecurity (12/26/2023)  Housing: Unknown (12/26/2023)  Transportation Needs: No Transportation Needs (12/26/2023)  Utilities: Not At Risk (12/26/2023)  Depression (PHQ2-9): High Risk (05/13/2023)  Financial Resource Strain: Medium Risk (03/24/2023)  Social Connections: Unknown (03/24/2023)  Stress: Stress Concern Present (03/24/2023)  Tobacco Use: Medium Risk (12/25/2023)   SDOH Interventions: Food Insecurity Interventions: Intervention Not Indicated Housing Interventions: Intervention Not Indicated Transportation Interventions: Intervention Not Indicated Utilities Interventions: Intervention Not Indicated   Readmission Risk Interventions     No data to display

## 2023-12-29 ENCOUNTER — Inpatient Hospital Stay (HOSPITAL_COMMUNITY)

## 2023-12-29 DIAGNOSIS — R5383 Other fatigue: Secondary | ICD-10-CM | POA: Diagnosis not present

## 2023-12-29 LAB — COMPREHENSIVE METABOLIC PANEL WITH GFR
ALT: 55 U/L — ABNORMAL HIGH (ref 0–44)
AST: 95 U/L — ABNORMAL HIGH (ref 15–41)
Albumin: 1.7 g/dL — ABNORMAL LOW (ref 3.5–5.0)
Alkaline Phosphatase: 85 U/L (ref 38–126)
Anion gap: 13 (ref 5–15)
BUN: 11 mg/dL (ref 6–20)
CO2: 25 mmol/L (ref 22–32)
Calcium: 8.2 mg/dL — ABNORMAL LOW (ref 8.9–10.3)
Chloride: 98 mmol/L (ref 98–111)
Creatinine, Ser: 0.81 mg/dL (ref 0.61–1.24)
GFR, Estimated: >60 mL/min (ref >60)
Glucose, Bld: 116 mg/dL — ABNORMAL HIGH (ref 70–99)
Potassium: 3.2 mmol/L — ABNORMAL LOW (ref 3.5–5.1)
Sodium: 136 mmol/L (ref 135–145)
Total Bilirubin: 1.3 mg/dL — ABNORMAL HIGH (ref 0.0–1.2)
Total Protein: 6.4 g/dL — ABNORMAL LOW (ref 6.5–8.1)

## 2023-12-29 LAB — CBC WITH DIFFERENTIAL/PLATELET
Abs Immature Granulocytes: 0.07 10*3/uL (ref 0.00–0.07)
Basophils Absolute: 0 10*3/uL (ref 0.0–0.1)
Basophils Relative: 0 %
Eosinophils Absolute: 0.1 10*3/uL (ref 0.0–0.5)
Eosinophils Relative: 1 %
HCT: 33.2 % — ABNORMAL LOW (ref 39.0–52.0)
Hemoglobin: 11.5 g/dL — ABNORMAL LOW (ref 13.0–17.0)
Immature Granulocytes: 1 %
Lymphocytes Relative: 13 %
Lymphs Abs: 1.3 10*3/uL (ref 0.7–4.0)
MCH: 34.6 pg — ABNORMAL HIGH (ref 26.0–34.0)
MCHC: 34.6 g/dL (ref 30.0–36.0)
MCV: 100 fL (ref 80.0–100.0)
Monocytes Absolute: 1 10*3/uL (ref 0.1–1.0)
Monocytes Relative: 10 %
Neutro Abs: 7.6 10*3/uL (ref 1.7–7.7)
Neutrophils Relative %: 75 %
Platelets: 131 10*3/uL — ABNORMAL LOW (ref 150–400)
RBC: 3.32 MIL/uL — ABNORMAL LOW (ref 4.22–5.81)
RDW: 19 % — ABNORMAL HIGH (ref 11.5–15.5)
WBC: 10 10*3/uL (ref 4.0–10.5)
nRBC: 0.2 % (ref 0.0–0.2)

## 2023-12-29 LAB — PROCALCITONIN: Procalcitonin: 0.26 ng/mL

## 2023-12-29 LAB — PROTIME-INR
INR: 1.3 — ABNORMAL HIGH (ref 0.8–1.2)
Prothrombin Time: 16.6 s — ABNORMAL HIGH (ref 11.4–15.2)

## 2023-12-29 LAB — ABO/RH: ABO/RH(D): A POS

## 2023-12-29 LAB — BRAIN NATRIURETIC PEPTIDE: B Natriuretic Peptide: 167.3 pg/mL — ABNORMAL HIGH (ref 0.0–100.0)

## 2023-12-29 LAB — TYPE AND SCREEN
ABO/RH(D): A POS
Antibody Screen: NEGATIVE

## 2023-12-29 LAB — MAGNESIUM: Magnesium: 1.7 mg/dL (ref 1.7–2.4)

## 2023-12-29 LAB — PHOSPHORUS: Phosphorus: 2.3 mg/dL — ABNORMAL LOW (ref 2.5–4.6)

## 2023-12-29 LAB — C-REACTIVE PROTEIN: CRP: 2.9 mg/dL — ABNORMAL HIGH (ref <1.0)

## 2023-12-29 MED ORDER — LACTATED RINGERS IV BOLUS
500.0000 mL | Freq: Once | INTRAVENOUS | Status: AC
  Administered 2023-12-29: 500 mL via INTRAVENOUS

## 2023-12-29 MED ORDER — CLONIDINE HCL 0.2 MG/24HR TD PTWK
0.2000 mg | MEDICATED_PATCH | TRANSDERMAL | Status: DC
  Administered 2023-12-29: 0.2 mg via TRANSDERMAL
  Filled 2023-12-29: qty 1

## 2023-12-29 MED ORDER — FUROSEMIDE 10 MG/ML IJ SOLN
40.0000 mg | Freq: Once | INTRAMUSCULAR | Status: AC
  Administered 2023-12-29: 40 mg via INTRAVENOUS
  Filled 2023-12-29: qty 4

## 2023-12-29 MED ORDER — LORAZEPAM 1 MG PO TABS: 1.0000 mg | ORAL_TABLET | ORAL | Status: AC | PRN

## 2023-12-29 MED ORDER — MAGNESIUM SULFATE IN D5W 1-5 GM/100ML-% IV SOLN
1.0000 g | Freq: Once | INTRAVENOUS | Status: AC
  Administered 2023-12-29: 1 g via INTRAVENOUS
  Filled 2023-12-29: qty 100

## 2023-12-29 MED ORDER — HYDRALAZINE HCL 20 MG/ML IJ SOLN: 10.0000 mg | Freq: Four times a day (QID) | INTRAMUSCULAR | Status: DC | PRN

## 2023-12-29 MED ORDER — LORAZEPAM 2 MG/ML IJ SOLN
1.0000 mg | INTRAMUSCULAR | Status: AC | PRN
  Administered 2024-01-01: 2 mg via INTRAVENOUS
  Filled 2023-12-29: qty 1

## 2023-12-29 MED ORDER — POTASSIUM PHOSPHATES 15 MMOLE/5ML IV SOLN
30.0000 mmol | Freq: Once | INTRAVENOUS | Status: AC
  Administered 2023-12-29: 30 mmol via INTRAVENOUS
  Filled 2023-12-29: qty 10

## 2023-12-29 MED ORDER — CHLORDIAZEPOXIDE HCL 5 MG PO CAPS
10.0000 mg | ORAL_CAPSULE | Freq: Three times a day (TID) | ORAL | Status: DC
  Administered 2023-12-29 (×2): 10 mg via ORAL
  Filled 2023-12-29 (×3): qty 2

## 2023-12-29 MED ORDER — LACTATED RINGERS IV SOLN: INTRAVENOUS | Status: DC

## 2023-12-29 MED ORDER — LORAZEPAM 2 MG/ML IJ SOLN
1.0000 mg | Freq: Once | INTRAMUSCULAR | Status: AC
  Administered 2023-12-29: 1 mg via INTRAVENOUS
  Filled 2023-12-29: qty 1

## 2023-12-29 MED ORDER — POTASSIUM PHOSPHATES 15 MMOLE/5ML IV SOLN: 30.0000 mmol | Freq: Once | INTRAVENOUS | Status: DC

## 2023-12-29 MED ORDER — POTASSIUM CHLORIDE 10 MEQ/100ML IV SOLN
10.0000 meq | INTRAVENOUS | Status: AC
  Administered 2023-12-29 (×4): 10 meq via INTRAVENOUS
  Filled 2023-12-29 (×4): qty 100

## 2023-12-29 NOTE — Progress Notes (Signed)
 PROGRESS NOTE                                                                                                                                                                                                             Patient Demographics:    Jeffrey Hodges, is a 58 y.o. male, DOB - 1965/08/11, ZOX:[id number]  Outpatient Primary MD for the patient is Dorothe Gaster, NP    LOS - 3  Admit date - 12/25/2023    Chief Complaint  Patient presents with   Alcohol Problem       Brief Narrative (HPI from H&P)   58 y.o. male with past medical history  of heart disease status post stent, heavy alcohol abuse, anemia, AKI, depression anxiety, elevated LFTs, hyperlipidemia, tobacco abuse, essential hypertension, was brought into the hospital due to less responsiveness and abnormal behavior.  Patient sees GI and cardiology.  It is being suspected that patient drinks 2 bottles of liquor essentially every day.  Patient was admitted with hepatic encephalopathy.    Subjective:   Patient in bed, got agitated last night likely went into DTs, required Ativan , now somnolent unable to provide review of systems   Assessment  & Plan :   Acute metabolic encephalopathy, suspicious for Wernicke's encephalopathy and now after 4 days going into DTs: Patient was admitted with ammonia levels of around 49 which is close to his baseline, ammonia levels have trended down, question if he is developing Wernicke's encephalopathy, placed on high-dose IV thiamine , CIWA protocol and Librium,   Continue IV fluids, PT and speech eval and monitor.  Stable MRI brain, EEG, RPR TSH and B12 stable.  No headache or focal deficits, check thiamine  levels.   Alcohol dependence: Consult to quit, CIWA protocol and Librium.   Dehydration, hypokalemia, hypophosphatemia: Continue hydration with IV fluids, electrolytes replaced  Alcoholic hepatitis/elevated LFTs: Mild, continue to  monitor, INR is relatively stable.   Chronic hyponatremia: Baseline low 130s.  Currently at baseline.  Secondary to alcoholism.   History of CAD status post stent, elevated troponin, it was mild and nonspecific: Stent placed in February 2024.  Troponin elevated but flat.  Patient with no ACS symptoms.  Likely demand ischemia.  Looks like he was on DAPT for a year after cath.  Continue aspirin , echocardiogram is nonacute with preserved EF of 55% and  no wall motion abnormality.   Anxiety/depression: Resuming Prozac  but holding BuSpar  for now.   Hyperlipidemia: Resume statin.  Looks like he is not taking at home.  Bladder outlet obstruction.  Foley, Flomax, ultrasound stable.       Condition - Extremely Guarded  Family Communication  : Parents bedside on 12/27/2023, 12/29/2023, wife Jeffrey Hodges over the  [phone]  12/27/2023, 12/28/2023  Code Status : Full code  Consults  :  None  PUD Prophylaxis :  PPI   Procedures  :     TTE - 1. Left ventricular ejection fraction, by estimation, is 55 to 60%. The left ventricle has normal function. The left ventricle has no regional wall motion abnormalities. There is mild left ventricular hypertrophy. Left ventricular diastolic parameters were normal.  2. Right ventricular systolic function is normal. The right ventricular size is normal. Tricuspid regurgitation signal is inadequate for assessing PA pressure.  3. The mitral valve is grossly normal. Trivial mitral valve regurgitation. No evidence of mitral stenosis.  4. The aortic valve is grossly normal. Aortic valve regurgitation is not visualized. No aortic stenosis is present.  5. The inferior vena cava is normal in size with <50% respiratory variability, suggesting right atrial pressure of 8 mmHg.  EEG - no active seizures.  MRI - 1. Motion degraded exam. 2. No acute intracranial abnormality. 3. Mild chronic microvascular ischemic disease for age.      Disposition Plan  :    Status is:  Inpatient    DVT Prophylaxis  :  Heparin   heparin  injection 5,000 Units Start: 12/27/23 1400 SCDs Start: 12/25/23 1909    Lab Results  Component Value Date   PLT 131 (L) 12/29/2023    Diet :  Diet Order             DIET SOFT Fluid consistency: Thin  Diet effective now                    Inpatient Medications  Scheduled Meds:  aspirin  EC  81 mg Oral Daily   atorvastatin   80 mg Oral Daily   chlordiazePOXIDE  10 mg Oral TID   Chlorhexidine Gluconate Cloth  6 each Topical Daily   cloNIDine  0.2 mg Transdermal Weekly   FLUoxetine   40 mg Oral Daily   folic acid   1 mg Oral Daily   heparin  injection (subcutaneous)  5,000 Units Subcutaneous Q8H   lactulose   30 g Oral TID   lactulose   300 mL Rectal Once   multivitamin with minerals  1 tablet Oral Daily   nicotine   21 mg Transdermal Daily   pantoprazole  40 mg Oral Daily   tamsulosin  0.4 mg Oral Daily   Continuous Infusions:  lactated ringers  125 mL/hr at 12/29/23 1028   magnesium  sulfate bolus IVPB     potassium chloride      potassium PHOSPHATE IVPB (in mmol)     thiamine  (VITAMIN B1) injection 500 mg (12/29/23 1033)   PRN Meds:.hydrALAZINE , LORazepam  **OR** LORazepam , nitroGLYCERIN , sodium chloride  flush  Antibiotics  :    Anti-infectives (From admission, onward)    None         Objective:   Vitals:   12/29/23 0400 12/29/23 0500 12/29/23 0800 12/29/23 0854  BP: 124/79   (!) 162/93  Pulse: (!) 125  (!) 115 (!) 116  Resp: (!) 21  (!) 25 (!) 24  Temp: 97.8 F (36.6 C)   (!) 97.5 F (36.4 C)  TempSrc: Oral  SpO2: 94%  96% 96%  Weight:  85.1 kg    Height:        Wt Readings from Last 3 Encounters:  12/29/23 85.1 kg  10/20/23 90.3 kg  05/13/23 89.9 kg     Intake/Output Summary (Last 24 hours) at 12/29/2023 1036 Last data filed at 12/29/2023 0557 Gross per 24 hour  Intake --  Output 175 ml  Net -175 ml     Physical Exam  Somnolent in bed, no focal deficits moves all 4 extremities to  painful stimuli [place].AT,PERRAL Supple Neck, No JVD,   Symmetrical Chest wall movement, Good air movement bilaterally, CTAB RRR,No Gallops,Rubs or new Murmurs,  +ve B.Sounds, Abd Soft, No tenderness,   No Cyanosis, Clubbing or edema       Data Review:    Recent Labs  Lab 12/25/23 1338 12/26/23 0717 12/27/23 0735 12/28/23 0749 12/29/23 0533  WBC 8.3 9.8 11.1* 9.9 10.0  HGB 13.9 13.0 12.0* 11.6* 11.5*  HCT 39.9 37.8* 36.0* 35.0* 33.2*  PLT 161 157 149* 136* 131*  MCV 98.0 100.0 100.6* 105.4* 100.0  MCH 34.2* 34.4* 33.5 34.9* 34.6*  MCHC 34.8 34.4 33.3 33.1 34.6  RDW 18.3* 18.2* 18.6* 19.3* 19.0*  LYMPHSABS 1.3  --  1.3 1.5 1.3  MONOABS 0.7  --  0.9 0.8 1.0  EOSABS 0.1  --  0.2 0.2 0.1  BASOSABS 0.0  --  0.0 0.0 0.0    Recent Labs  Lab 12/25/23 1338 12/25/23 1531 12/25/23 1912 12/26/23 0717 12/26/23 1200 12/26/23 1651 12/27/23 0735 12/28/23 0749 12/28/23 1200 12/29/23 0533 12/29/23 0601  NA 131*  --   --  132*  --   --  136 137  --  136  --   K 2.8*  --   --  2.5*  --   --  3.0* 3.4*  --  3.2*  --   CL 81*  --   --  85*  --   --  93* 98  --  98  --   CO2 35*  --   --  35*  --   --  32 26  --  25  --   ANIONGAP 15  --   --  12  --   --  11 13  --  13  --   GLUCOSE 118*  --   --  97  --   --  113* 117*  --  116*  --   BUN 9  --   --  12  --   --  13 10  --  11  --   CREATININE 0.93  --   --  0.68  --   --  0.66 0.59*  --  0.81  --   AST 142*  --   --  116*  --   --  99* 79*  --  95*  --   ALT 55*  --   --  53*  --   --  50* 44  --  55*  --   ALKPHOS 94  --   --  88  --   --  78 79  --  85  --   BILITOT 2.5*  --   --  2.0*  --   --  1.7* 1.2  --  1.3*  --   ALBUMIN 1.8*  --   --  1.8*  --   --  1.7* 1.5*  --  1.7*  --  CRP  --   --   --   --   --   --  3.2* 2.7*  --  2.9*  --   PROCALCITON  --   --   --   --   --   --  0.47 0.48  --  0.26  --   INR 1.2  --   --   --   --   --   --   --  1.2  --  1.3*  TSH  --   --  1.453  --   --   --   --   --   --   --   --    AMMONIA 49*  --   --   --   --  30  --   --   --   --   --   BNP 58.9  --   --   --   --   --  122.9* 663.6*  --  167.3*  --   MG  --  2.2  --   --  2.3  --  2.1 2.0  --  1.7  --   PHOS  --   --   --   --   --   --  1.9* 3.0  --  2.3*  --   CALCIUM  7.9*  --   --  7.7*  --   --  8.1* 7.8*  --  8.2*  --       Recent Labs  Lab 12/25/23 1338 12/25/23 1531 12/25/23 1912 12/26/23 0717 12/26/23 1200 12/26/23 1651 12/27/23 0735 12/28/23 0749 12/28/23 1200 12/29/23 0533 12/29/23 0601  CRP  --   --   --   --   --   --  3.2* 2.7*  --  2.9*  --   PROCALCITON  --   --   --   --   --   --  0.47 0.48  --  0.26  --   INR 1.2  --   --   --   --   --   --   --  1.2  --  1.3*  TSH  --   --  1.453  --   --   --   --   --   --   --   --   AMMONIA 49*  --   --   --   --  30  --   --   --   --   --   BNP 58.9  --   --   --   --   --  122.9* 663.6*  --  167.3*  --   MG  --  2.2  --   --  2.3  --  2.1 2.0  --  1.7  --   CALCIUM  7.9*  --   --  7.7*  --   --  8.1* 7.8*  --  8.2*  --     --------------------------------------------------------------------------------------------------------------- Lab Results  Component Value Date   CHOL 301 (H) 09/10/2022   HDL 78 09/10/2022   LDLCALC 207 (H) 09/10/2022   TRIG 82 09/10/2022   CHOLHDL 3.9 09/10/2022    Lab Results  Component Value Date   HGBA1C 5.4 09/10/2022   No results for input(s): TSH, T4TOTAL, FREET4, T3FREE, THYROIDAB in the last 72 hours.  No results for input(s): VITAMINB12, FOLATE, FERRITIN, TIBC, IRON, RETICCTPCT in the last 72 hours.  ------------------------------------------------------------------------------------------------------------------  Cardiac Enzymes No results for input(s): CKMB, TROPONINI, MYOGLOBIN in the last 168 hours.  Invalid input(s): CK  Micro Results No results found for this or any previous visit (from the past 240 hours).  Radiology Report US  RENAL Result Date:  12/29/2023 CLINICAL DATA:  Acute kidney injury. EXAM: RENAL / URINARY TRACT ULTRASOUND COMPLETE COMPARISON:  04/02/2023 FINDINGS: Right Kidney: Renal measurements: 10.4 x 5.0 x 6.8 cm = volume: 184 mL. Echogenicity within normal limits. No mass or hydronephrosis visualized. Left Kidney: Renal measurements: 11.0 x 6.1 x 5.9 cm = volume: 208 mL. Echogenicity within normal limits. No mass or hydronephrosis visualized. Bladder: Decompressed. Other: Moderate volume ascites evident. IMPRESSION: 1. No evidence for hydronephrosis. 2. Moderate volume ascites. Electronically Signed   By: Donnal Fusi M.D.   On: 12/29/2023 08:39   DG Chest Port 1 View Result Date: 12/29/2023 CLINICAL DATA:  Shortness of breath. History of heart disease status post stenting. EXAM: PORTABLE CHEST 1 VIEW COMPARISON:  Radiographs 04/02/2023 and 09/10/2022. FINDINGS: 0806 hours. Lordotic positioning with lower lung volumes. The heart size and mediastinal contours are normal. The lungs are clear. There is no pleural effusion or pneumothorax. No acute osseous findings are identified. Telemetry leads overlie the chest. IMPRESSION: No evidence of acute cardiopulmonary process. Electronically Signed   By: Elmon Hagedorn M.D.   On: 12/29/2023 08:30   ECHOCARDIOGRAM COMPLETE Result Date: 12/27/2023    ECHOCARDIOGRAM REPORT   Patient Name:   Jeffrey Hodges Date of Exam: 12/27/2023 Medical Rec #:  [id number]     Height:       72.0 in Accession #:    [id number]    Weight:       185.6 lb Date of Birth:  09/11/1965    BSA:          2.064 m Patient Age:    57 years      BP:           138/84 mmHg Patient Gender: M             HR:           99 bpm. Exam Location:  Inpatient Procedure: 2D Echo, Cardiac Doppler and Color Doppler (Both Spectral and Color            Flow Doppler were utilized during procedure). Indications:    Acute ischemic heart disease, unspecified I24.9  History:        Patient has prior history of Echocardiogram examinations, most                  recent 04/03/2023. CAD; Risk Factors:Hypertension, Dyslipidemia                 and Former Smoker.  Sonographer:    Kip Peon RDCS Referring Phys: Emelda [facility]ane [facility]  Sonographer Comments: Suboptimal parasternal window. Image acquisition challenging due to uncooperative patient. IMPRESSIONS  1. Left ventricular ejection fraction, by estimation, is 55 to 60%. The left ventricle has normal function. The left ventricle has no regional wall motion abnormalities. There is mild left ventricular hypertrophy. Left ventricular diastolic parameters were normal.  2. Right ventricular systolic function is normal. The right ventricular size is normal. Tricuspid regurgitation signal is inadequate for assessing PA pressure.  3. The mitral valve is grossly normal. Trivial mitral valve regurgitation. No evidence of mitral stenosis.  4. The aortic valve is grossly normal. Aortic valve regurgitation is not visualized. No aortic stenosis is present.  5. The inferior vena cava  is normal in size with <50% respiratory variability, suggesting right atrial pressure of 8 mmHg. FINDINGS  Left Ventricle: Left ventricular ejection fraction, by estimation, is 55 to 60%. The left ventricle has normal function. The left ventricle has no regional wall motion abnormalities. The left ventricular internal cavity size was normal in size. There is  mild left ventricular hypertrophy. Left ventricular diastolic parameters were normal. Right Ventricle: The right ventricular size is normal. No increase in right ventricular wall thickness. Right ventricular systolic function is normal. Tricuspid regurgitation signal is inadequate for assessing PA pressure. Left Atrium: Left atrial size was normal in size. Right Atrium: Right atrial size was normal in size. Pericardium: There is no evidence of pericardial effusion. Mitral Valve: The mitral valve is grossly normal. Trivial mitral valve regurgitation. No evidence of mitral valve stenosis.  Tricuspid Valve: The tricuspid valve is normal in structure. Tricuspid valve regurgitation is trivial. No evidence of tricuspid stenosis. Aortic Valve: The aortic valve is grossly normal. Aortic valve regurgitation is not visualized. No aortic stenosis is present. Pulmonic Valve: The pulmonic valve was normal in structure. Pulmonic valve regurgitation is not visualized. No evidence of pulmonic stenosis. Aorta: The aortic root is normal in size and structure. Venous: The inferior vena cava is normal in size with less than 50% respiratory variability, suggesting right atrial pressure of 8 mmHg. IAS/Shunts: No atrial level shunt detected by color flow Doppler.  LEFT VENTRICLE PLAX 2D LVIDd:         4.50 cm   Diastology LVIDs:         2.90 cm   LV e' medial:    8.05 cm/s LV PW:         1.10 cm   LV E/e' medial:  10.1 LV IVS:        0.90 cm   LV e' lateral:   12.30 cm/s LVOT diam:     1.80 cm   LV E/e' lateral: 6.6 LV SV:         56 LV SV Index:   27 LVOT Area:     2.54 cm  RIGHT VENTRICLE RV S prime:     18.95 cm/s LEFT ATRIUM             Index        RIGHT ATRIUM           Index LA diam:        3.70 cm 1.79 cm/m   RA Area:     13.60 cm LA Vol (A2C):   24.2 ml 11.72 ml/m  RA Volume:   28.90 ml  14.00 ml/m LA Vol (A4C):   43.8 ml 21.22 ml/m LA Biplane Vol: 36.2 ml 17.54 ml/m  AORTIC VALVE LVOT Vmax:   115.00 cm/s LVOT Vmean:  78.600 cm/s LVOT VTI:    0.222 m  AORTA Ao Root diam: 3.30 cm MITRAL VALVE MV Area (PHT): 4.49 cm    SHUNTS MV Decel Time: 169 msec    Systemic VTI:  0.22 m MV E velocity: 81.20 cm/s  Systemic Diam: 1.80 cm MV A velocity: 93.30 cm/s MV E/A ratio:  0.87 Grady Lawman MD Electronically signed by Grady Lawman MD Signature Date/Time: 12/27/2023/4:02:19 PM    Final      Signature  -   Lynnwood Sauer M.D on 12/29/2023 at 10:36 AM   -  To page go to [email or web address]

## 2023-12-29 NOTE — Plan of Care (Signed)
  Problem: Education: Goal: Knowledge of General Education information will improve Description: Including pain rating scale, medication(s)/side effects and non-pharmacologic comfort measures Outcome: Progressing   Problem: Health Behavior/Discharge Planning: Goal: Ability to manage health-related needs will improve Outcome: Progressing   Problem: Clinical Measurements: Goal: Ability to maintain clinical measurements within normal limits will improve Outcome: Progressing   Problem: Activity: Goal: Risk for activity intolerance will decrease Outcome: Progressing   Problem: Nutrition: Goal: Adequate nutrition will be maintained Outcome: Progressing   Problem: Coping: Goal: Level of anxiety will decrease Outcome: Progressing   Problem: Elimination: Goal: Will not experience complications related to bowel motility Outcome: Progressing Goal: Will not experience complications related to urinary retention Outcome: Progressing   Problem: Pain Managment: Goal: General experience of comfort will improve and/or be controlled Outcome: Progressing   Problem: Safety: Goal: Ability to remain free from injury will improve Outcome: Progressing

## 2023-12-29 NOTE — Progress Notes (Signed)
 Physical Therapy Treatment Patient Details Name: Jeffrey Hodges MRN: [id number] DOB: Nov 15, 1965 Today's Date: 12/29/2023   History of Present Illness 58 y.o. male brought into the hospital 6/12 due to less responsiveness and abnormal behavior. Patient was admitted with hepatic encephalopathy. PMH: heart disease status post stent, heavy alcohol abuse, anemia, AKI, depression anxiety, elevated LFTs, hyperlipidemia, tobacco abuse, essential hypertension.    PT Comments  Pt received in bed in 4 point restraints. Pt verbally cooperative but has sensation of needing to urinate but cannot understand concept of foley and rectal tube. Supervision for safety to EOB and spent increased time in sitting with him attempting to urinate but unable and complained of burning. Pt ambulated 49' with RW and up to mod A for balance. HR up to 152 bpm from 120 bpm at rest. Took several standing rest breaks. Pt glad to be mobilizing and keeps saying he wants to get home. Worked on standing balance with unilateral support. Pt with decreased attention to body position and environment so balance remains very poor. Restraints reapplied end of session, wife present. Patient will benefit from continued inpatient follow up therapy, <3 hours/day. PT will continue to follow.     If plan is discharge home, recommend the following: A lot of help with walking and/or transfers;A lot of help with bathing/dressing/bathroom;Assistance with cooking/housework;Direct supervision/assist for medications management;Direct supervision/assist for financial management;Help with stairs or ramp for entrance;Assist for transportation;Supervision due to cognitive status   Can travel by private vehicle     Yes  Equipment Recommendations  Other (comment) (TBD)    Recommendations for Other Services       Precautions / Restrictions Precautions Precautions: Fall Restrictions Weight Bearing Restrictions Per Provider Order: No     Mobility  Bed  Mobility Overal bed mobility: Needs Assistance Bed Mobility: Sit to Supine, Supine to Sit     Supine to sit: Supervision Sit to supine: Supervision   General bed mobility comments: pt able to come to EOB without physical assist, assist needed for attending to lines and mgmt of lines. Pt able to return to supine without physical assist    Transfers Overall transfer level: Needs assistance Equipment used: Rolling walker (2 wheels) Transfers: Sit to/from Stand Sit to Stand: Min assist           General transfer comment: min A to boost. Cues to remain close to RW in standing. Constant reminders that he has a catheter and rectal tube and can use the bathroom as needed, pt very agitated by this    Ambulation/Gait Ambulation/Gait assistance: Mod assist Gait Distance (Feet): 70 Feet Assistive device: Rolling walker (2 wheels) Gait Pattern/deviations: Step-through pattern, Decreased stride length, Staggering left, Staggering right, Drifts right/left, Wide base of support, Ataxic Gait velocity: dec Gait velocity interpretation: <1.8 ft/sec, indicate of risk for recurrent falls   General Gait Details: Ataxic with erratic RW control requiring up to mod assist for balance and control of AD. HR up to 152 bpm (from 120 bpm at rest). Took several standing rest breaks to let HR come down.   Stairs             Wheelchair Mobility     Tilt Bed    Modified Rankin (Stroke Patients Only)       Balance Overall balance assessment: Needs assistance Sitting-balance support: No upper extremity supported, Feet supported Sitting balance-Leahy Scale: Fair Sitting balance - Comments: maintained sitting EOB increased time to attempt to let pt urinate but pt could not  understand the concept of the catheter   Standing balance support: Bilateral upper extremity supported, Reliant on assistive device for balance Standing balance-Leahy Scale: Poor Standing balance comment: unsteady in  standing                            Communication Communication Communication: No apparent difficulties  Cognition Arousal: Alert Behavior During Therapy: Flat affect   PT - Cognitive impairments: Orientation, Awareness, Memory, Attention, Initiation, Sequencing, Problem solving, Safety/Judgement   Orientation impairments: Place, Time, Situation                   PT - Cognition Comments: confused, agitated when alert, follows commands immediately but does not retain instructions to continue to follow so needs constant redirection Following commands: Impaired Following commands impaired: Follows one step commands inconsistently    Cueing Cueing Techniques: Verbal cues, Gestural cues, Tactile cues  Exercises      General Comments General comments (skin integrity, edema, etc.): SPO2 stable on RA. Wife present.      Pertinent Vitals/Pain Pain Assessment Pain Assessment: Faces Faces Pain Scale: Hurts even more Pain Location: penis when trying to urinate Pain Descriptors / Indicators: Burning Pain Intervention(s): Limited activity within patient's tolerance, Monitored during session    Home Living                          Prior Function            PT Goals (current goals can now be found in the care plan section) Acute Rehab PT Goals Patient Stated Goal: go home PT Goal Formulation: With patient/family Time For Goal Achievement: 01/10/24 Potential to Achieve Goals: Good Progress towards PT goals: Progressing toward goals    Frequency    Min 3X/week      PT Plan      Co-evaluation              AM-PAC PT 6 Clicks Mobility   Outcome Measure  Help needed turning from your back to your side while in a flat bed without using bedrails?: A Little Help needed moving from lying on your back to sitting on the side of a flat bed without using bedrails?: A Lot Help needed moving to and from a bed to a chair (including a wheelchair)?:  A Lot Help needed standing up from a chair using your arms (e.g., wheelchair or bedside chair)?: A Lot Help needed to walk in hospital room?: A Lot Help needed climbing 3-5 steps with a railing? : A Lot 6 Click Score: 13    End of Session Equipment Utilized During Treatment: Gait belt Activity Tolerance: Treatment limited secondary to agitation Patient left: in bed;with call bell/phone within reach;with bed alarm set;with restraints reapplied;with family/visitor present Nurse Communication: Mobility status PT Visit Diagnosis: Unsteadiness on feet (R26.81);Other abnormalities of gait and mobility (R26.89);Other symptoms and signs involving the nervous system (R29.898);Ataxic gait (R26.0);History of falling (Z91.81);Muscle weakness (generalized) (M62.81);Repeated falls (R29.6)     Time: 2831-5176 PT Time Calculation (min) (ACUTE ONLY): 44 min  Charges:    $Gait Training: 8-22 mins $Therapeutic Activity: 23-37 mins PT General Charges $$ ACUTE PT VISIT: 1 Visit                     Amey Ka, PT  Acute Rehab Services Secure chat preferred Office [phone]    Deloris Fetters Dynesha Woolen 12/29/2023, 3:02 PM

## 2023-12-29 NOTE — Progress Notes (Signed)
 Patient is agitated, pulling out his lines and cords, take off his gown, can't keep him down, wants to leave the hospital and needs to call security as he don't want to go inside his room. Dr. [facility] made aware; ordered for Ativan  and restraints. Patient is still agitated while on restraints; elevated BP and HR noticed due to agitation. Messaged Dr. [facility] for another dose of ativan  to make the patient calm and to see whether his numbers is just because of agitation. His blood pressure was back to normal but his HR is sustained in 120s. Foley Catheter inserted at 2300 without output but pt has painful attempt to urinate but still no output. No bladder distention noted. Will do bladder scan once patient is calm and stable.

## 2023-12-30 ENCOUNTER — Inpatient Hospital Stay (HOSPITAL_COMMUNITY)

## 2023-12-30 DIAGNOSIS — R5383 Other fatigue: Secondary | ICD-10-CM | POA: Diagnosis not present

## 2023-12-30 LAB — SODIUM, URINE, RANDOM: Sodium, Ur: 17 mmol/L

## 2023-12-30 LAB — CBC WITH DIFFERENTIAL/PLATELET
Abs Immature Granulocytes: 0.03 10*3/uL (ref 0.00–0.07)
Basophils Absolute: 0 10*3/uL (ref 0.0–0.1)
Basophils Relative: 0 %
Eosinophils Absolute: 0.1 10*3/uL (ref 0.0–0.5)
Eosinophils Relative: 1 %
HCT: 32.4 % — ABNORMAL LOW (ref 39.0–52.0)
Hemoglobin: 11.1 g/dL — ABNORMAL LOW (ref 13.0–17.0)
Immature Granulocytes: 1 %
Lymphocytes Relative: 18 %
Lymphs Abs: 1.2 10*3/uL (ref 0.7–4.0)
MCH: 34.7 pg — ABNORMAL HIGH (ref 26.0–34.0)
MCHC: 34.3 g/dL (ref 30.0–36.0)
MCV: 101.3 fL — ABNORMAL HIGH (ref 80.0–100.0)
Monocytes Absolute: 0.5 10*3/uL (ref 0.1–1.0)
Monocytes Relative: 7 %
Neutro Abs: 4.8 10*3/uL (ref 1.7–7.7)
Neutrophils Relative %: 73 %
Platelets: 146 10*3/uL — ABNORMAL LOW (ref 150–400)
RBC: 3.2 MIL/uL — ABNORMAL LOW (ref 4.22–5.81)
RDW: 19 % — ABNORMAL HIGH (ref 11.5–15.5)
WBC: 6.6 10*3/uL (ref 4.0–10.5)
nRBC: 0 % (ref 0.0–0.2)

## 2023-12-30 LAB — COMPREHENSIVE METABOLIC PANEL WITH GFR
ALT: 47 U/L — ABNORMAL HIGH (ref 0–44)
AST: 75 U/L — ABNORMAL HIGH (ref 15–41)
Albumin: 1.5 g/dL — ABNORMAL LOW (ref 3.5–5.0)
Alkaline Phosphatase: 88 U/L (ref 38–126)
Anion gap: 6 (ref 5–15)
BUN: 10 mg/dL (ref 6–20)
CO2: 28 mmol/L (ref 22–32)
Calcium: 7.8 mg/dL — ABNORMAL LOW (ref 8.9–10.3)
Chloride: 101 mmol/L (ref 98–111)
Creatinine, Ser: 0.75 mg/dL (ref 0.61–1.24)
GFR, Estimated: >60 mL/min (ref >60)
Glucose, Bld: 110 mg/dL — ABNORMAL HIGH (ref 70–99)
Potassium: 3.6 mmol/L (ref 3.5–5.1)
Sodium: 135 mmol/L (ref 135–145)
Total Bilirubin: 1.1 mg/dL (ref 0.0–1.2)
Total Protein: 5.8 g/dL — ABNORMAL LOW (ref 6.5–8.1)

## 2023-12-30 LAB — URINALYSIS, ROUTINE W REFLEX MICROSCOPIC
Bilirubin Urine: NEGATIVE
Glucose, UA: 50 mg/dL — AB
Ketones, ur: NEGATIVE mg/dL
Leukocytes,Ua: NEGATIVE
Nitrite: NEGATIVE
Protein, ur: 100 mg/dL — AB
RBC / HPF: >50 RBC/hpf (ref 0–5)
Specific Gravity, Urine: 1.036 — ABNORMAL HIGH (ref 1.005–1.030)
pH: 5 (ref 5.0–8.0)

## 2023-12-30 LAB — OSMOLALITY: Osmolality: 291 mosm/kg (ref 275–295)

## 2023-12-30 LAB — BRAIN NATRIURETIC PEPTIDE: B Natriuretic Peptide: 233.2 pg/mL — ABNORMAL HIGH (ref 0.0–100.0)

## 2023-12-30 LAB — OSMOLALITY, URINE: Osmolality, Ur: 812 mosm/kg (ref 300–900)

## 2023-12-30 LAB — MAGNESIUM: Magnesium: 2 mg/dL (ref 1.7–2.4)

## 2023-12-30 LAB — CREATININE, URINE, RANDOM: Creatinine, Urine: 303 mg/dL

## 2023-12-30 LAB — PROCALCITONIN: Procalcitonin: 0.23 ng/mL

## 2023-12-30 LAB — URIC ACID: Uric Acid, Serum: 5.5 mg/dL (ref 3.7–8.6)

## 2023-12-30 LAB — C-REACTIVE PROTEIN: CRP: 3.4 mg/dL — ABNORMAL HIGH (ref <1.0)

## 2023-12-30 LAB — PHOSPHORUS: Phosphorus: 3.7 mg/dL (ref 2.5–4.6)

## 2023-12-30 LAB — CK: Total CK: 805 U/L — ABNORMAL HIGH (ref 49–397)

## 2023-12-30 LAB — VITAMIN B1: Vitamin B1 (Thiamine): 142.7 nmol/L (ref 66.5–200.0)

## 2023-12-30 MED ORDER — FUROSEMIDE 10 MG/ML IJ SOLN
20.0000 mg | Freq: Once | INTRAMUSCULAR | Status: AC
  Administered 2023-12-30: 20 mg via INTRAVENOUS
  Filled 2023-12-30: qty 2

## 2023-12-30 MED ORDER — SODIUM CHLORIDE 0.9 % IV SOLN
1.0000 g | INTRAVENOUS | Status: AC
  Administered 2023-12-30 – 2024-01-03 (×5): 1 g via INTRAVENOUS
  Filled 2023-12-30 (×5): qty 10

## 2023-12-30 MED ORDER — CHLORDIAZEPOXIDE HCL 5 MG PO CAPS
15.0000 mg | ORAL_CAPSULE | Freq: Three times a day (TID) | ORAL | Status: DC
  Administered 2023-12-30 – 2024-01-03 (×12): 15 mg via ORAL
  Filled 2023-12-30 (×12): qty 3

## 2023-12-30 MED ORDER — LACTULOSE 10 GM/15ML PO SOLN
30.0000 g | Freq: Two times a day (BID) | ORAL | Status: DC
  Administered 2023-12-31 – 2024-01-07 (×14): 30 g via ORAL
  Filled 2023-12-30 (×15): qty 60

## 2023-12-30 MED ORDER — LACTATED RINGERS IV SOLN: INTRAVENOUS | Status: DC

## 2023-12-30 NOTE — Telephone Encounter (Signed)
 [name] canceled due to be in the hospital

## 2023-12-30 NOTE — Progress Notes (Addendum)
 PROGRESS NOTE                                                                                                                                                                                                             Patient Demographics:    Jeffrey Hodges, is a 58 y.o. male, DOB - 01-09-1966, ZOX:[id number]  Outpatient Primary MD for the patient is Dorothe Gaster, NP    LOS - 4  Admit date - 12/25/2023    Chief Complaint  Patient presents with   Alcohol Problem       Brief Narrative (HPI from H&P)   58 y.o. male with past medical history  of heart disease status post stent, heavy alcohol abuse, anemia, AKI, depression anxiety, elevated LFTs, hyperlipidemia, tobacco abuse, essential hypertension, was brought into the hospital due to less responsiveness and abnormal behavior.  Patient sees GI and cardiology.  It is being suspected that patient drinks 2 bottles of liquor essentially every day.  Patient was admitted with hepatic encephalopathy.    Subjective:   Patient in bed, got agitated last night likely went into DTs, required Ativan , now somnolent unable to provide review of systems   Assessment  & Plan :   Acute metabolic encephalopathy, suspicious for Wernicke's encephalopathy and now after 4 days going into DTs: Patient was admitted with ammonia levels of around 49 which is close to his baseline, ammonia levels have trended down, question if he is developing Wernicke's encephalopathy, placed on high-dose IV thiamine , CIWA protocol and Librium,   Continue IV fluids, PT and speech eval and monitor.  Stable MRI brain, EEG, RPR TSH and B12 stable.  No headache or focal deficits, stable thiamine  levels.   Alcohol dependence: Consult to quit, CIWA protocol and Librium.   Dehydration, hypokalemia, hypophosphatemia: Continue hydration with IV fluids, electrolytes replaced  Alcoholic hepatitis/elevated LFTs: Mild, continue to  monitor, INR is relatively stable.   Chronic hyponatremia: Baseline low 130s.  Currently at baseline.  Secondary to alcoholism.   History of CAD status post stent, elevated troponin, it was mild and nonspecific: Stent placed in February 2024.  Troponin elevated but flat.  Patient with no ACS symptoms.  Likely demand ischemia.  Looks like he was on DAPT for a year after cath.  Continue aspirin , echocardiogram is nonacute with preserved EF of 55% and  no wall motion abnormality.   Anxiety/depression: Resuming Prozac  but holding BuSpar  for now.   Hyperlipidemia: Resume statin.  Looks like he is not taking at home.  Bladder outlet obstruction.  Foley, Flomax, ultrasound stable.  Some hematuria.  Stable bladder scan and renal ultrasound, Foley in place, bladder decompressed, likely due to catheter placement injury, continue hydration and monitor.  Also likely borderline UTI, 5 days of Rocephin.       Condition - Extremely Guarded  Family Communication  : Parents bedside on 12/27/2023, 12/29/2023, wife [name] over the  [phone]  12/27/2023, 12/28/2023, 12/30/2023  Code Status : Full code  Consults  :  None  PUD Prophylaxis :  PPI   Procedures  :     TTE - 1. Left ventricular ejection fraction, by estimation, is 55 to 60%. The left ventricle has normal function. The left ventricle has no regional wall motion abnormalities. There is mild left ventricular hypertrophy. Left ventricular diastolic parameters were normal.  2. Right ventricular systolic function is normal. The right ventricular size is normal. Tricuspid regurgitation signal is inadequate for assessing PA pressure.  3. The mitral valve is grossly normal. Trivial mitral valve regurgitation. No evidence of mitral stenosis.  4. The aortic valve is grossly normal. Aortic valve regurgitation is not visualized. No aortic stenosis is present.  5. The inferior vena cava is normal in size with <50% respiratory variability, suggesting right atrial  pressure of 8 mmHg.  EEG - no active seizures.  MRI - 1. Motion degraded exam. 2. No acute intracranial abnormality. 3. Mild chronic microvascular ischemic disease for age.      Disposition Plan  :    Status is: Inpatient    DVT Prophylaxis  :  Heparin   heparin  injection 5,000 Units Start: 12/27/23 1400 SCDs Start: 12/25/23 1909    Lab Results  Component Value Date   PLT 146 (L) 12/30/2023    Diet :  Diet Order             DIET SOFT Fluid consistency: Thin  Diet effective now                    Inpatient Medications  Scheduled Meds:  aspirin  EC  81 mg Oral Daily   atorvastatin   80 mg Oral Daily   chlordiazePOXIDE  10 mg Oral TID   Chlorhexidine Gluconate Cloth  6 each Topical Daily   cloNIDine  0.2 mg Transdermal Weekly   FLUoxetine   40 mg Oral Daily   folic acid   1 mg Oral Daily   furosemide  20 mg Intravenous Once   heparin  injection (subcutaneous)  5,000 Units Subcutaneous Q8H   lactulose   30 g Oral TID   lactulose   300 mL Rectal Once   multivitamin with minerals  1 tablet Oral Daily   nicotine   21 mg Transdermal Daily   pantoprazole  40 mg Oral Daily   tamsulosin  0.4 mg Oral Daily   Continuous Infusions:  cefTRIAXone (ROCEPHIN)  IV     lactated ringers  75 mL/hr at 12/30/23 0641   thiamine  (VITAMIN B1) injection Stopped (12/29/23 1103)   PRN Meds:.hydrALAZINE , LORazepam  **OR** LORazepam , nitroGLYCERIN , sodium chloride  flush  Antibiotics  :    Anti-infectives (From admission, onward)    Start     Dose/Rate Route Frequency Ordered Stop   12/30/23 1115  cefTRIAXone (ROCEPHIN) 1 g in sodium chloride  0.9 % 100 mL IVPB        1 g 200 mL/hr over 30  Minutes Intravenous Every 24 hours 12/30/23 1020 01/04/24 1114         Objective:   Vitals:   12/30/23 0200 12/30/23 0400 12/30/23 0500 12/30/23 0800  BP:  (!) 145/75  (!) 125/51  Pulse: 96 100  (!) 103  Resp: (!) 22 17  18   Temp:  (!) 97.4 F (36.3 C)  98.1 F (36.7 C)  TempSrc:  Axillary   Oral  SpO2: 98% 99%  99%  Weight:   88.6 kg   Height:        Wt Readings from Last 3 Encounters:  12/30/23 88.6 kg  10/20/23 90.3 kg  05/13/23 89.9 kg     Intake/Output Summary (Last 24 hours) at 12/30/2023 1020 Last data filed at 12/30/2023 0600 Gross per 24 hour  Intake 603.87 ml  Output 300 ml  Net 303.87 ml     Physical Exam  Somnolent in bed, no focal deficits moves all 4 extremities to painful stimuli [place].AT,PERRAL Supple Neck, No JVD,   Symmetrical Chest wall movement, Good air movement bilaterally, CTAB RRR,No Gallops,Rubs or new Murmurs,  +ve B.Sounds, Abd Soft, No tenderness,   No Cyanosis, Clubbing or edema       Data Review:    Recent Labs  Lab 12/25/23 1338 12/26/23 0717 12/27/23 0735 12/28/23 0749 12/29/23 0533 12/30/23 0500  WBC 8.3 9.8 11.1* 9.9 10.0 6.6  HGB 13.9 13.0 12.0* 11.6* 11.5* 11.1*  HCT 39.9 37.8* 36.0* 35.0* 33.2* 32.4*  PLT 161 157 149* 136* 131* 146*  MCV 98.0 100.0 100.6* 105.4* 100.0 101.3*  MCH 34.2* 34.4* 33.5 34.9* 34.6* 34.7*  MCHC 34.8 34.4 33.3 33.1 34.6 34.3  RDW 18.3* 18.2* 18.6* 19.3* 19.0* 19.0*  LYMPHSABS 1.3  --  1.3 1.5 1.3 1.2  MONOABS 0.7  --  0.9 0.8 1.0 0.5  EOSABS 0.1  --  0.2 0.2 0.1 0.1  BASOSABS 0.0  --  0.0 0.0 0.0 0.0    Recent Labs  Lab 12/25/23 1338 12/25/23 1531 12/25/23 1912 12/26/23 0717 12/26/23 1200 12/26/23 1651 12/27/23 0735 12/28/23 0749 12/28/23 1200 12/29/23 0533 12/29/23 0601 12/30/23 0500  NA 131*  --   --  132*  --   --  136 137  --  136  --  135  K 2.8*  --   --  2.5*  --   --  3.0* 3.4*  --  3.2*  --  3.6  CL 81*  --   --  85*  --   --  93* 98  --  98  --  101  CO2 35*  --   --  35*  --   --  32 26  --  25  --  28  ANIONGAP 15  --   --  12  --   --  11 13  --  13  --  6  GLUCOSE 118*  --   --  97  --   --  113* 117*  --  116*  --  110*  BUN 9  --   --  12  --   --  13 10  --  11  --  10  CREATININE 0.93  --   --  0.68  --   --  0.66 0.59*  --  0.81  --  0.75  AST 142*   --   --  116*  --   --  99* 79*  --  95*  --  75*  ALT 55*  --   --  53*  --   --  50* 44  --  55*  --  47*  ALKPHOS 94  --   --  88  --   --  78 79  --  85  --  88  BILITOT 2.5*  --   --  2.0*  --   --  1.7* 1.2  --  1.3*  --  1.1  ALBUMIN 1.8*  --   --  1.8*  --   --  1.7* 1.5*  --  1.7*  --  1.5*  CRP  --   --   --   --   --   --  3.2* 2.7*  --  2.9*  --  3.4*  PROCALCITON  --   --   --   --   --   --  0.47 0.48  --  0.26  --  0.23  INR 1.2  --   --   --   --   --   --   --  1.2  --  1.3*  --   TSH  --   --  1.453  --   --   --   --   --   --   --   --   --   AMMONIA 49*  --   --   --   --  30  --   --   --   --   --   --   BNP 58.9  --   --   --   --   --  122.9* 663.6*  --  167.3*  --  233.2*  MG  --    < >  --   --  2.3  --  2.1 2.0  --  1.7  --  2.0  PHOS  --   --   --   --   --   --  1.9* 3.0  --  2.3*  --  3.7  CALCIUM  7.9*  --   --  7.7*  --   --  8.1* 7.8*  --  8.2*  --  7.8*   < > = values in this interval not displayed.      Recent Labs  Lab 12/25/23 1338 12/25/23 1531 12/25/23 1912 12/26/23 0717 12/26/23 1200 12/26/23 1651 12/27/23 0735 12/28/23 0749 12/28/23 1200 12/29/23 0533 12/29/23 0601 12/30/23 0500  CRP  --   --   --   --   --   --  3.2* 2.7*  --  2.9*  --  3.4*  PROCALCITON  --   --   --   --   --   --  0.47 0.48  --  0.26  --  0.23  INR 1.2  --   --   --   --   --   --   --  1.2  --  1.3*  --   TSH  --   --  1.453  --   --   --   --   --   --   --   --   --   AMMONIA 49*  --   --   --   --  30  --   --   --   --   --   --   BNP 58.9  --   --   --   --   --  122.9* 663.6*  --  167.3*  --  233.2*  MG  --    < >  --   --  2.3  --  2.1 2.0  --  1.7  --  2.0  CALCIUM  7.9*  --   --  7.7*  --   --  8.1* 7.8*  --  8.2*  --  7.8*   < > = values in this interval not displayed.    --------------------------------------------------------------------------------------------------------------- Lab Results  Component Value Date   CHOL 301 (H) 09/10/2022   HDL 78  09/10/2022   LDLCALC 207 (H) 09/10/2022   TRIG 82 09/10/2022   CHOLHDL 3.9 09/10/2022    Lab Results  Component Value Date   HGBA1C 5.4 09/10/2022   No results for input(s): TSH, T4TOTAL, FREET4, T3FREE, THYROIDAB in the last 72 hours.  No results for input(s): VITAMINB12, FOLATE, FERRITIN, TIBC, IRON, RETICCTPCT in the last 72 hours.  ------------------------------------------------------------------------------------------------------------------ Cardiac Enzymes No results for input(s): CKMB, TROPONINI, MYOGLOBIN in the last 168 hours.  Invalid input(s): CK  Micro Results No results found for this or any previous visit (from the past 240 hours).  Radiology Report DG Chest Port 1 View Result Date: 12/30/2023 CLINICAL DATA:  58 year old male with shortness of breath. EXAM: PORTABLE CHEST 1 VIEW COMPARISON:  Portable chest yesterday and earlier. FINDINGS: Portable AP semi upright view at 0655 hours. Continued low lung volumes. Normal cardiac size and mediastinal contours. Visualized tracheal air column is within normal limits. No pneumothorax, pleural effusion, consolidation, or pulmonary edema. Mild lung base atelectasis, more apparent on the right. No acute osseous abnormality identified. Negative visible bowel gas. IMPRESSION: Low lung volumes with right greater than left lung base atelectasis. Electronically Signed   By: Marlise Simpers M.D.   On: 12/30/2023 07:13   DG Abd Portable 1V Result Date: 12/29/2023 CLINICAL DATA:  Nausea EXAM: PORTABLE ABDOMEN - 1 VIEW COMPARISON:  None Available. FINDINGS: The bowel gas pattern is normal. No radio-opaque calculi or other significant radiographic abnormality are seen. IMPRESSION: Negative. Electronically Signed   By: Fredrich Jefferson M.D.   On: 12/29/2023 11:39   US  RENAL Result Date: 12/29/2023 CLINICAL DATA:  Acute kidney injury. EXAM: RENAL / URINARY TRACT ULTRASOUND COMPLETE COMPARISON:  04/02/2023 FINDINGS: Right  Kidney: Renal measurements: 10.4 x 5.0 x 6.8 cm = volume: 184 mL. Echogenicity within normal limits. No mass or hydronephrosis visualized. Left Kidney: Renal measurements: 11.0 x 6.1 x 5.9 cm = volume: 208 mL. Echogenicity within normal limits. No mass or hydronephrosis visualized. Bladder: Decompressed. Other: Moderate volume ascites evident. IMPRESSION: 1. No evidence for hydronephrosis. 2. Moderate volume ascites. Electronically Signed   By: Donnal Fusi M.D.   On: 12/29/2023 08:39   DG Chest Port 1 View Result Date: 12/29/2023 CLINICAL DATA:  Shortness of breath. History of heart disease status post stenting. EXAM: PORTABLE CHEST 1 VIEW COMPARISON:  Radiographs 04/02/2023 and 09/10/2022. FINDINGS: 0806 hours. Lordotic positioning with lower lung volumes. The heart size and mediastinal contours are normal. The lungs are clear. There is no pleural effusion or pneumothorax. No acute osseous findings are identified. Telemetry leads overlie the chest. IMPRESSION: No evidence of acute cardiopulmonary process. Electronically Signed   By: Elmon Hagedorn M.D.   On: 12/29/2023 08:30     Signature  -   Lynnwood Sauer M.D on 12/30/2023 at 10:20 AM   -  To page go to [email or web address]

## 2023-12-30 NOTE — Progress Notes (Signed)
 Physical Therapy Treatment Patient Details Name: Jeffrey Hodges MRN: [id number] DOB: 09/10/1965 Today's Date: 12/30/2023   History of Present Illness 58 y.o. male brought into the hospital 6/12 due to less responsiveness and abnormal behavior. Patient was admitted with hepatic encephalopathy. PMH: heart disease status post stent, heavy alcohol abuse, anemia, AKI, depression anxiety, elevated LFTs, hyperlipidemia, tobacco abuse, essential hypertension.    PT Comments  Session limited by pt being soiled with leaking rectal drain that continued to leak while mobilizing out of bed. Assisted with peri-care and hygiene. Erratic control of RW, required mod assist for balance with gait. Cues for safety throughout. Patient will continue to benefit from skilled physical therapy services to further improve independence with functional mobility.     If plan is discharge home, recommend the following: A lot of help with walking and/or transfers;A lot of help with bathing/dressing/bathroom;Assistance with cooking/housework;Direct supervision/assist for medications management;Direct supervision/assist for financial management;Help with stairs or ramp for entrance;Assist for transportation;Supervision due to cognitive status   Can travel by private vehicle     Yes  Equipment Recommendations  Other (comment) (TBD)    Recommendations for Other Services       Precautions / Restrictions Precautions Precautions: Fall Restrictions Weight Bearing Restrictions Per Provider Order: No     Mobility  Bed Mobility Overal bed mobility: Needs Assistance Bed Mobility: Sit to Supine, Supine to Sit     Supine to sit: Contact guard, HOB elevated, Used rails Sit to supine: Contact guard assist, HOB elevated, Used rails   General bed mobility comments: CGA for safety with reduced gross motor control but able to rise on his own today. Pt needs cues to facilitate LEs back into bed.    Transfers Overall transfer  level: Needs assistance Equipment used: Rolling walker (2 wheels) Transfers: Sit to/from Stand Sit to Stand: Min assist           General transfer comment: Min assist for boost and balance, multidirectional instability. Performed x3 from bed and from standard chair with arm rests x1. Cues for technique and RW placement. Pushes out of base of support.    Ambulation/Gait Ambulation/Gait assistance: Mod assist Gait Distance (Feet): 10 Feet (x2) Assistive device: Rolling walker (2 wheels) Gait Pattern/deviations: Step-through pattern, Decreased stride length, Staggering left, Staggering right, Drifts right/left, Wide base of support, Ataxic, Shuffle, Scissoring Gait velocity: dec Gait velocity interpretation: <1.8 ft/sec, indicate of risk for recurrent falls   General Gait Details: Ataxic with erratic RW control requiring up to mod assist for balance and control of AD. VC for awareness and multimodal cues for control and RW placement within BOS. Distance limited by bowel incontinence and leakage of flatus pouch.   Stairs             Wheelchair Mobility     Tilt Bed    Modified Rankin (Stroke Patients Only)       Balance Overall balance assessment: Needs assistance Sitting-balance support: No upper extremity supported, Feet supported Sitting balance-Leahy Scale: Fair     Standing balance support: Bilateral upper extremity supported, Reliant on assistive device for balance Standing balance-Leahy Scale: Poor Standing balance comment: unsteady in standing                            Communication Communication Communication: No apparent difficulties  Cognition Arousal: Alert Behavior During Therapy: Flat affect   PT - Cognitive impairments: Orientation, Awareness, Memory, Attention, Initiation, Sequencing, Problem solving, Safety/Judgement  Following commands: Impaired Following commands impaired: Follows one step  commands inconsistently    Cueing Cueing Techniques: Verbal cues, Gestural cues, Tactile cues  Exercises      General Comments General comments (skin integrity, edema, etc.): Soiled linens. Changed. Assisted pt with peri-care in standing.      Pertinent Vitals/Pain Pain Assessment Pain Assessment: Faces Faces Pain Scale: Hurts even more Pain Location: buttocks Pain Descriptors / Indicators: Burning Pain Intervention(s): Monitored during session, Repositioned    Home Living                          Prior Function            PT Goals (current goals can now be found in the care plan section) Acute Rehab PT Goals Patient Stated Goal: go home PT Goal Formulation: With patient/family Time For Goal Achievement: 01/10/24 Potential to Achieve Goals: Good Progress towards PT goals: Progressing toward goals    Frequency    Min 3X/week      PT Plan      Co-evaluation              AM-PAC PT 6 Clicks Mobility   Outcome Measure  Help needed turning from your back to your side while in a flat bed without using bedrails?: A Little Help needed moving from lying on your back to sitting on the side of a flat bed without using bedrails?: A Lot Help needed moving to and from a bed to a chair (including a wheelchair)?: A Lot Help needed standing up from a chair using your arms (e.g., wheelchair or bedside chair)?: A Lot Help needed to walk in hospital room?: A Lot Help needed climbing 3-5 steps with a railing? : A Lot 6 Click Score: 13    End of Session Equipment Utilized During Treatment: Gait belt Activity Tolerance: Other (comment) (rectal drain leaking.) Patient left: in bed;with call bell/phone within reach;with bed alarm set;with restraints reapplied;with family/visitor present (4 pt.) Nurse Communication: Mobility status PT Visit Diagnosis: Unsteadiness on feet (R26.81);Other abnormalities of gait and mobility (R26.89);Other symptoms and signs involving  the nervous system (R29.898);Ataxic gait (R26.0);History of falling (Z91.81);Muscle weakness (generalized) (M62.81);Repeated falls (R29.6)     Time: 1610-9604 PT Time Calculation (min) (ACUTE ONLY): 34 min  Charges:    $Therapeutic Activity: 23-37 mins PT General Charges $$ ACUTE PT VISIT: 1 Visit                     Jory Ng, PT, DPT [facility]  Rehabilitation Services Physical Therapist Office: [phone] Website: [email or web address]    Alinda Irani 12/30/2023, 3:52 PM

## 2023-12-30 NOTE — Evaluation (Signed)
 Occupational Therapy Evaluation Patient Details Name: Jeffrey Hodges MRN: [id number] DOB: 12-14-1965 Today's Date: 12/30/2023   History of Present Illness   58 y.o. male brought into the hospital 6/12 due to less responsiveness and abnormal behavior. Patient was admitted with hepatic encephalopathy. PMH: heart disease status post stent, heavy alcohol abuse, anemia, AKI, depression anxiety, elevated LFTs, hyperlipidemia, tobacco abuse, essential hypertension.     Clinical Impressions Pt was independent prior to admission. He endorses falls prior to admission. Pt states he knows it is his drinking that led to this admission. He is concerned about the stress on his wife. Pt presents with impaired cognition with decreased awareness of deficits and safety. He requires up to min assist for bed mobility. Pt stands with min assist. He is highly unsteady in standing requiring moderate assistance for balance and for walker placement. Pt needs set up to total assist for ADLs. He reports eating very little at home. Patient will benefit from continued inpatient follow up therapy, <3 hours/day      If plan is discharge home, recommend the following:   A lot of help with walking and/or transfers;A lot of help with bathing/dressing/bathroom;Assistance with cooking/housework;Direct supervision/assist for medications management;Direct supervision/assist for financial management;Assist for transportation;Help with stairs or ramp for entrance     Functional Status Assessment   Patient has had a recent decline in their functional status and demonstrates the ability to make significant improvements in function in a reasonable and predictable amount of time.     Equipment Recommendations   BSC/3in1     Recommendations for Other Services         Precautions/Restrictions   Precautions Precautions: Fall Restrictions Weight Bearing Restrictions Per Provider Order: No     Mobility Bed  Mobility Overal bed mobility: Needs Assistance Bed Mobility: Sit to Supine, Supine to Sit     Supine to sit: Contact guard, HOB elevated, Used rails Sit to supine: HOB elevated, Used rails, Min assist   General bed mobility comments: use of rail, min assist for LEs back into bed    Transfers Overall transfer level: Needs assistance Equipment used: Rolling walker (2 wheels) Transfers: Sit to/from Stand Sit to Stand: Min assist           General transfer comment: cues for hand placement and safety, assist to rise and steady, assist to guide walker      Balance Overall balance assessment: Needs assistance   Sitting balance-Leahy Scale: Fair Sitting balance - Comments: no LOB while participating in grooming at EOB   Standing balance support: Bilateral upper extremity supported, Reliant on assistive device for balance Standing balance-Leahy Scale: Poor Standing balance comment: reliant on RW and up to moderate assist                           ADL either performed or assessed with clinical judgement   ADL Overall ADL's : Needs assistance/impaired Eating/Feeding: Set up;Bed level;Sitting Eating/Feeding Details (indicate cue type and reason): drinking only, uninterested in eating Grooming: Wash/dry hands;Wash/dry face;Oral care;Sitting;Minimal assistance   Upper Body Bathing: Minimal assistance;Sitting   Lower Body Bathing: Maximal assistance;Sit to/from stand   Upper Body Dressing : Minimal assistance;Sitting   Lower Body Dressing: Maximal assistance;Sit to/from stand   Toilet Transfer: Moderate assistance;Ambulation;Rolling walker (2 wheels)   Toileting- Clothing Manipulation and Hygiene: Total assistance;Sit to/from stand       Functional mobility during ADLs: Moderate assistance;Rolling walker (2 wheels)  Vision Ability to See in Adequate Light: 0 Adequate Patient Visual Report: No change from baseline       Perception         Praxis          Pertinent Vitals/Pain Pain Assessment Pain Assessment: Faces Faces Pain Scale: Hurts even more Pain Location: buttocks Pain Descriptors / Indicators: Burning Pain Intervention(s): Monitored during session, Repositioned     Extremity/Trunk Assessment Upper Extremity Assessment Upper Extremity Assessment: Overall WFL for tasks assessed;Right hand dominant   Lower Extremity Assessment Lower Extremity Assessment: Defer to PT evaluation   Cervical / Trunk Assessment Cervical / Trunk Assessment: Normal   Communication Communication Communication: No apparent difficulties   Cognition Arousal: Alert Behavior During Therapy: Flat affect Cognition: Cognition impaired   Orientation impairments: Time Awareness: Online awareness impaired, Intellectual awareness impaired Memory impairment (select all impairments): Short-term memory, Working [employer] fast streamer, Conservation officer, historic buildings Attention impairment (select first level of impairment): Sustained attention Executive functioning impairment (select all impairments): Problem solving, Sequencing, Reasoning OT - Cognition Comments: poor safety awareness                 Following commands: Impaired Following commands impaired: Follows one step commands inconsistently     Cueing  General Comments   Cueing Techniques: Verbal cues;Gestural cues;Tactile cues  Soiled linens. Changed. Assisted pt with peri-care in standing.   Exercises     Shoulder Instructions      Home Living Family/patient expects to be discharged to:: Private residence Living Arrangements: Spouse/significant other Available Help at Discharge: Family;Available 24 hours/day Type of Home: House Home Access: Stairs to enter [employer] of Steps: 3 Entrance Stairs-Rails: Right;Left Home Layout: Two level Alternate Level Stairs-Number of Steps: 13 Alternate Level Stairs-Rails: Left Bathroom Shower/Tub: Tub/shower unit;Walk-in shower   Bathroom  Toilet: Standard     Home Equipment: Shower seat - built in          Prior Functioning/Environment Prior Level of Function : Independent/Modified Independent;History of Falls (last six months)               ADLs Comments: relies on wife for housekeeping, meal prep    OT Problem List: Decreased strength;Decreased activity tolerance;Impaired balance (sitting and/or standing);Decreased coordination;Decreased cognition;Decreased safety awareness;Decreased knowledge of use of DME or AE   OT Treatment/Interventions: Self-care/ADL training;DME and/or AE instruction;Therapeutic activities;Cognitive remediation/compensation;Patient/family education;Balance training      OT Goals(Current goals can be found in the care plan section)   Acute Rehab OT Goals OT Goal Formulation: With patient Time For Goal Achievement: 01/13/24 Potential to Achieve Goals: Good ADL Goals Pt Will Perform Grooming: with min assist;standing Pt Will Perform Upper Body Dressing: with supervision;sitting Pt Will Perform Lower Body Dressing: with min assist;sit to/from stand Pt Will Transfer to Toilet: with min assist;ambulating Pt Will Perform Toileting - Clothing Manipulation and hygiene: with min assist;sit to/from stand Additional ADL Goal #1: Pt will follow two step directions with 50% accuracy.   OT Frequency:  Min 2X/week    Co-evaluation              AM-PAC OT 6 Clicks Daily Activity     Outcome Measure Help from another person eating meals?: A Little Help from another person taking care of personal grooming?: A Little Help from another person toileting, which includes using toliet, bedpan, or urinal?: Total Help from another person bathing (including washing, rinsing, drying)?: A Lot Help from another person to put on and taking off regular upper body clothing?: A  Little Help from another person to put on and taking off regular lower body clothing?: A Lot 6 Click Score: 14   End of  Session Equipment Utilized During Treatment: Rolling walker (2 wheels);Gait belt  Activity Tolerance: Patient tolerated treatment well Patient left: in bed;with call bell/phone within reach;with bed alarm set;with restraints reapplied  OT Visit Diagnosis: Unsteadiness on feet (R26.81);Other abnormalities of gait and mobility (R26.89);Other symptoms and signs involving cognitive function;Muscle weakness (generalized) (M62.81);History of falling (Z91.81);Pain                Time: 1459-1520 OT Time Calculation (min): 21 min Charges:  OT General Charges $OT Visit: 1 Visit OT Evaluation $OT Eval Moderate Complexity: 1 Mod  Avanell Leigh, OTR/L Acute Rehabilitation Services Office: [phone]   Jonette Nestle 12/30/2023, 4:08 PM

## 2023-12-30 NOTE — Progress Notes (Signed)
 Speech Language Pathology Treatment: Dysphagia  Patient Details Name: Jeffrey Hodges MRN: [id number] DOB: [birth year] Today's Date: 12/30/2023 Time: 4098-1191 SLP Time Calculation (min) (ACUTE ONLY): 12 min  Assessment / Plan / Recommendation Clinical Impression  Skilled therapy session focused on dysphagia goals. SLP offered PO trials of regular solids and thin liquids via cup and straw. Patient with mildly prolonged mastication of solids, however complete oral clearance. Patient with intermittent throat clears after thin liquid trials from cup and straw. SLP educated patient that this may be a sign of bolus misdirection and recommended completion of MBS to assess oropharyngeal swallow function. Patient declined and verbally acknowledged aspiration risk of continuing current diet without instrumental study. Patients significant other present in agreement with patients decision. Recommend continuation of current diet per patient request.    HPI HPI: 58 y.o. male brought into the hospital 6/12 due to less responsiveness and abnormal behavior. Patient was admitted with hepatic encephalopathy. PMH: heart disease status post stent, heavy alcohol abuse, anemia, AKI, depression anxiety, elevated LFTs, hyperlipidemia, tobacco abuse, essential hypertension.      SLP Plan  Continue with current plan of care          Recommendations  Diet recommendations: Regular;Thin liquid Liquids provided via: Cup;Straw Medication Administration: Whole meds with puree Supervision: Staff to assist with self feeding Compensations: Minimize environmental distractions;Slow rate;Small sips/bites Postural Changes and/or Swallow Maneuvers: Seated upright 90 degrees           Oral care BID     Dysphagia, unspecified (R13.10)     Continue with current plan of care     Melaine Mcphee M.A., CCC-SLP 12/30/2023, 11:46 AM

## 2023-12-30 NOTE — Plan of Care (Signed)

## 2023-12-31 DIAGNOSIS — R5383 Other fatigue: Secondary | ICD-10-CM | POA: Diagnosis not present

## 2023-12-31 LAB — COMPREHENSIVE METABOLIC PANEL WITH GFR
ALT: 46 U/L — ABNORMAL HIGH (ref 0–44)
AST: 67 U/L — ABNORMAL HIGH (ref 15–41)
Albumin: <1.5 g/dL — ABNORMAL LOW (ref 3.5–5.0)
Alkaline Phosphatase: 94 U/L (ref 38–126)
Anion gap: 7 (ref 5–15)
BUN: 10 mg/dL (ref 6–20)
CO2: 26 mmol/L (ref 22–32)
Calcium: 7.4 mg/dL — ABNORMAL LOW (ref 8.9–10.3)
Chloride: 98 mmol/L (ref 98–111)
Creatinine, Ser: 0.84 mg/dL (ref 0.61–1.24)
GFR, Estimated: >60 mL/min (ref >60)
Glucose, Bld: 103 mg/dL — ABNORMAL HIGH (ref 70–99)
Potassium: 3.6 mmol/L (ref 3.5–5.1)
Sodium: 131 mmol/L — ABNORMAL LOW (ref 135–145)
Total Bilirubin: 1.2 mg/dL (ref 0.0–1.2)
Total Protein: 5.8 g/dL — ABNORMAL LOW (ref 6.5–8.1)

## 2023-12-31 LAB — CBC WITH DIFFERENTIAL/PLATELET
Abs Immature Granulocytes: 0.04 10*3/uL (ref 0.00–0.07)
Basophils Absolute: 0 10*3/uL (ref 0.0–0.1)
Basophils Relative: 0 %
Eosinophils Absolute: 0.1 10*3/uL (ref 0.0–0.5)
Eosinophils Relative: 2 %
HCT: 31.2 % — ABNORMAL LOW (ref 39.0–52.0)
Hemoglobin: 10.7 g/dL — ABNORMAL LOW (ref 13.0–17.0)
Immature Granulocytes: 1 %
Lymphocytes Relative: 20 %
Lymphs Abs: 1.2 10*3/uL (ref 0.7–4.0)
MCH: 34.2 pg — ABNORMAL HIGH (ref 26.0–34.0)
MCHC: 34.3 g/dL (ref 30.0–36.0)
MCV: 99.7 fL (ref 80.0–100.0)
Monocytes Absolute: 0.5 10*3/uL (ref 0.1–1.0)
Monocytes Relative: 9 %
Neutro Abs: 3.8 10*3/uL (ref 1.7–7.7)
Neutrophils Relative %: 68 %
Platelets: 124 10*3/uL — ABNORMAL LOW (ref 150–400)
RBC: 3.13 MIL/uL — ABNORMAL LOW (ref 4.22–5.81)
RDW: 18.9 % — ABNORMAL HIGH (ref 11.5–15.5)
WBC: 5.7 10*3/uL (ref 4.0–10.5)
nRBC: 0 % (ref 0.0–0.2)

## 2023-12-31 LAB — C-REACTIVE PROTEIN: CRP: 3 mg/dL — ABNORMAL HIGH (ref <1.0)

## 2023-12-31 LAB — BRAIN NATRIURETIC PEPTIDE: B Natriuretic Peptide: 83.3 pg/mL (ref 0.0–100.0)

## 2023-12-31 LAB — VITAMIN B1: Vitamin B1 (Thiamine): 119.9 nmol/L (ref 66.5–200.0)

## 2023-12-31 LAB — PHOSPHORUS: Phosphorus: 3.6 mg/dL (ref 2.5–4.6)

## 2023-12-31 LAB — CK: Total CK: 506 U/L — ABNORMAL HIGH (ref 49–397)

## 2023-12-31 LAB — UREA NITROGEN, URINE: Urea Nitrogen, Ur: 857 mg/dL

## 2023-12-31 LAB — PROCALCITONIN: Procalcitonin: 0.26 ng/mL

## 2023-12-31 LAB — MYOGLOBIN, URINE: Myoglobin, Ur: 7 ng/mL (ref 0–13)

## 2023-12-31 LAB — MAGNESIUM: Magnesium: 1.9 mg/dL (ref 1.7–2.4)

## 2023-12-31 MED ORDER — SODIUM CHLORIDE 0.9% FLUSH
10.0000 mL | INTRAVENOUS | Status: DC | PRN
  Administered 2024-01-06: 10 mL

## 2023-12-31 MED ORDER — LACTATED RINGERS IV SOLN: INTRAVENOUS | Status: DC

## 2023-12-31 MED ORDER — POTASSIUM CHLORIDE CRYS ER 20 MEQ PO TBCR: 40.0000 meq | EXTENDED_RELEASE_TABLET | Freq: Once | ORAL | Status: DC

## 2023-12-31 NOTE — Plan of Care (Signed)

## 2023-12-31 NOTE — TOC Progression Note (Signed)
 Transition of Care [facility]) - Progression Note    Patient Details  Name: Jeffrey Hodges MRN: [id number] Date of Birth: [birth year]  [facility] of Care [facility]) CM/SW Contact  Jannice Mends, LCSW Phone Number: 12/31/2023, 9:38 AM  Clinical Narrative:    CSW continuing to follow for appropriateness of SNF placement.    Expected Discharge Plan: Skilled Nursing Facility Barriers to Discharge: Continued Medical Work up  Expected Discharge Plan and Services In-house Referral: Clinical Social Work     Living arrangements for the past 2 months: Single Family Home                                       Social Determinants of Health (SDOH) Interventions SDOH Screenings   Food Insecurity: No Food Insecurity (12/26/2023)  Housing: Unknown (12/26/2023)  Transportation Needs: No Transportation Needs (12/26/2023)  Utilities: Not At Risk (12/26/2023)  Depression (PHQ2-9): High Risk (05/13/2023)  Financial Resource Strain: Medium Risk (03/24/2023)  Social Connections: Unknown (03/24/2023)  Stress: Stress Concern Present (03/24/2023)  Tobacco Use: Medium Risk (12/25/2023)    Readmission Risk Interventions     No data to display

## 2023-12-31 NOTE — Plan of Care (Signed)
   Problem: Health Behavior/Discharge Planning: Goal: Ability to manage health-related needs will improve Outcome: Progressing

## 2023-12-31 NOTE — Progress Notes (Signed)
 PROGRESS NOTE                                                                                                                                                                                                             Patient Demographics:    Jeffrey Hodges, is a 58 y.o. male, DOB - 1966-05-28, AVW:[id number]  Outpatient Primary MD for the patient is Dorothe Gaster, NP    LOS - 5  Admit date - 12/25/2023    Chief Complaint  Patient presents with   Alcohol Problem       Brief Narrative (HPI from H&P)   58 y.o. male with past medical history  of heart disease status post stent, heavy alcohol abuse, anemia, AKI, depression anxiety, elevated LFTs, hyperlipidemia, tobacco abuse, essential hypertension, was brought into the hospital due to less responsiveness and abnormal behavior.  Patient sees GI and cardiology.  It is being suspected that patient drinks 2 bottles of liquor essentially every day.  Patient was admitted with hepatic encephalopathy.    Subjective:   Patient in bed, appears comfortable, mildly confused but denies any headache, no fever, no chest pain or pressure, no shortness of breath , no abdominal pain. No new focal weakness.    Assessment  & Plan :   Acute metabolic encephalopathy, suspicious for Wernicke's encephalopathy and now after 4 days going into DTs: Patient was admitted with ammonia levels of around 49 which is close to his baseline, ammonia levels have trended down, question if he is developing Wernicke's encephalopathy, placed on high-dose IV thiamine , CIWA protocol and Librium,   Continue IV fluids, PT and speech eval and monitor.  Stable MRI brain, EEG, RPR TSH and B12 stable.  No headache or focal deficits, stable thiamine  levels.   Alcohol dependence: Consult to quit, CIWA protocol and Librium.   Dehydration, hypokalemia, hypophosphatemia: Continue hydration with IV fluids, electrolytes  replaced  Alcoholic hepatitis/elevated LFTs: Mild, continue to monitor, INR is relatively stable.   Chronic hyponatremia: Baseline low 130s.  Currently at baseline.  Secondary to alcoholism.   History of CAD status post stent, elevated troponin, it was mild and nonspecific: Stent placed in February 2024.  Troponin elevated but flat.  Patient with no ACS symptoms.  Likely demand ischemia.  Looks like he was on DAPT for a year after cath.  Continue  aspirin , echocardiogram is nonacute with preserved EF of 55% and no wall motion abnormality.   Anxiety/depression: Resuming Prozac  but holding BuSpar  for now.   Hyperlipidemia: Resume statin.  Looks like he is not taking at home.  Bladder outlet obstruction.  Foley, Flomax, ultrasound stable.  Some hematuria.  Stable bladder scan and renal ultrasound, Foley in place, bladder decompressed, likely due to catheter placement injury, continue hydration and monitor.  Also likely borderline UTI, 5 days of Rocephin.       Condition - Extremely Guarded  Family Communication  : Parents bedside on 12/27/2023, 12/29/2023, wife Jeffrey Hodges over the  [phone]  12/27/2023, 12/28/2023, 12/30/2023  Code Status : Full code  Consults  :  None  PUD Prophylaxis :  PPI   Procedures  :     TTE - 1. Left ventricular ejection fraction, by estimation, is 55 to 60%. The left ventricle has normal function. The left ventricle has no regional wall motion abnormalities. There is mild left ventricular hypertrophy. Left ventricular diastolic parameters were normal.  2. Right ventricular systolic function is normal. The right ventricular size is normal. Tricuspid regurgitation signal is inadequate for assessing PA pressure.  3. The mitral valve is grossly normal. Trivial mitral valve regurgitation. No evidence of mitral stenosis.  4. The aortic valve is grossly normal. Aortic valve regurgitation is not visualized. No aortic stenosis is present.  5. The inferior vena cava is normal  in size with <50% respiratory variability, suggesting right atrial pressure of 8 mmHg.  EEG - no active seizures.  MRI - 1. Motion degraded exam. 2. No acute intracranial abnormality. 3. Mild chronic microvascular ischemic disease for age.      Disposition Plan  :    Status is: Inpatient    DVT Prophylaxis  :  Heparin   heparin  injection 5,000 Units Start: 12/27/23 1400 SCDs Start: 12/25/23 1909    Lab Results  Component Value Date   PLT 124 (L) 12/31/2023    Diet :  Diet Order             DIET SOFT Fluid consistency: Thin  Diet effective now                    Inpatient Medications  Scheduled Meds:  aspirin  EC  81 mg Oral Daily   chlordiazePOXIDE  15 mg Oral TID   Chlorhexidine Gluconate Cloth  6 each Topical Daily   cloNIDine  0.2 mg Transdermal Weekly   FLUoxetine   40 mg Oral Daily   folic acid   1 mg Oral Daily   heparin  injection (subcutaneous)  5,000 Units Subcutaneous Q8H   lactulose   30 g Oral BID   multivitamin with minerals  1 tablet Oral Daily   nicotine   21 mg Transdermal Daily   pantoprazole  40 mg Oral Daily   tamsulosin  0.4 mg Oral Daily   Continuous Infusions:  cefTRIAXone (ROCEPHIN)  IV Stopped (12/30/23 1232)   lactated ringers      thiamine  (VITAMIN B1) injection 500 mg (12/30/23 1813)   PRN Meds:.hydrALAZINE , LORazepam  **OR** LORazepam , nitroGLYCERIN , sodium chloride  flush  Antibiotics  :    Anti-infectives (From admission, onward)    Start     Dose/Rate Route Frequency Ordered Stop   12/30/23 1115  cefTRIAXone (ROCEPHIN) 1 g in sodium chloride  0.9 % 100 mL IVPB        1 g 200 mL/hr over 30 Minutes Intravenous Every 24 hours 12/30/23 1020 01/04/24 1114  Objective:   Vitals:   12/31/23 0000 12/31/23 0400 12/31/23 0629 12/31/23 0819  BP:  125/78  124/79  Pulse:  (!) 102 (!) 103 90  Resp:  20 19 16   Temp: 98.1 F (36.7 C)  98 F (36.7 C) 97.9 F (36.6 C)  TempSrc: Oral  Oral Oral  SpO2:  98% 97% 99%  Weight:       Height:        Wt Readings from Last 3 Encounters:  12/30/23 88.6 kg  10/20/23 90.3 kg  05/13/23 89.9 kg     Intake/Output Summary (Last 24 hours) at 12/31/2023 1021 Last data filed at 12/31/2023 0600 Gross per 24 hour  Intake --  Output 200 ml  Net -200 ml     Physical Exam  In bed, awake but mildly confused, no focal deficits moves all 4 extremities, Foley in place [place].AT,PERRAL Supple Neck, No JVD,   Symmetrical Chest wall movement, Good air movement bilaterally, CTAB RRR,No Gallops,Rubs or new Murmurs,  +ve B.Sounds, Abd Soft, No tenderness,   No Cyanosis, Clubbing or edema       Data Review:    Recent Labs  Lab 12/27/23 0735 12/28/23 0749 12/29/23 0533 12/30/23 0500 12/31/23 0538  WBC 11.1* 9.9 10.0 6.6 5.7  HGB 12.0* 11.6* 11.5* 11.1* 10.7*  HCT 36.0* 35.0* 33.2* 32.4* 31.2*  PLT 149* 136* 131* 146* 124*  MCV 100.6* 105.4* 100.0 101.3* 99.7  MCH 33.5 34.9* 34.6* 34.7* 34.2*  MCHC 33.3 33.1 34.6 34.3 34.3  RDW 18.6* 19.3* 19.0* 19.0* 18.9*  LYMPHSABS 1.3 1.5 1.3 1.2 1.2  MONOABS 0.9 0.8 1.0 0.5 0.5  EOSABS 0.2 0.2 0.1 0.1 0.1  BASOSABS 0.0 0.0 0.0 0.0 0.0    Recent Labs  Lab 12/25/23 1338 12/25/23 1531 12/25/23 1912 12/26/23 0717 12/26/23 1651 12/27/23 0735 12/28/23 0749 12/28/23 1200 12/29/23 0533 12/29/23 0601 12/30/23 0500 12/31/23 0538  NA 131*  --   --    < >  --  136 137  --  136  --  135 131*  K 2.8*  --   --    < >  --  3.0* 3.4*  --  3.2*  --  3.6 3.6  CL 81*  --   --    < >  --  93* 98  --  98  --  101 98  CO2 35*  --   --    < >  --  32 26  --  25  --  28 26  ANIONGAP 15  --   --    < >  --  11 13  --  13  --  6 7  GLUCOSE 118*  --   --    < >  --  113* 117*  --  116*  --  110* 103*  BUN 9  --   --    < >  --  13 10  --  11  --  10 10  CREATININE 0.93  --   --    < >  --  0.66 0.59*  --  0.81  --  0.75 0.84  AST 142*  --   --    < >  --  99* 79*  --  95*  --  75* 67*  ALT 55*  --   --    < >  --  50* 44  --  55*  --  47*  46*  ALKPHOS 94  --   --    < >  --  78 79  --  85  --  88 94  BILITOT 2.5*  --   --    < >  --  1.7* 1.2  --  1.3*  --  1.1 1.2  ALBUMIN 1.8*  --   --    < >  --  1.7* 1.5*  --  1.7*  --  1.5* <1.5*  CRP  --   --   --   --   --  3.2* 2.7*  --  2.9*  --  3.4* 3.0*  PROCALCITON  --   --   --   --   --  0.47 0.48  --  0.26  --  0.23 0.26  INR 1.2  --   --   --   --   --   --  1.2  --  1.3*  --   --   TSH  --   --  1.453  --   --   --   --   --   --   --   --   --   AMMONIA 49*  --   --   --  30  --   --   --   --   --   --   --   BNP 58.9  --   --   --   --  122.9* 663.6*  --  167.3*  --  233.2* 83.3  MG  --    < >  --    < >  --  2.1 2.0  --  1.7  --  2.0 1.9  PHOS  --   --   --   --   --  1.9* 3.0  --  2.3*  --  3.7 3.6  CALCIUM  7.9*  --   --    < >  --  8.1* 7.8*  --  8.2*  --  7.8* 7.4*   < > = values in this interval not displayed.      Recent Labs  Lab 12/25/23 1338 12/25/23 1531 12/25/23 1912 12/26/23 0717 12/26/23 1651 12/27/23 0735 12/28/23 0749 12/28/23 1200 12/29/23 0533 12/29/23 0601 12/30/23 0500 12/31/23 0538  CRP  --   --   --   --   --  3.2* 2.7*  --  2.9*  --  3.4* 3.0*  PROCALCITON  --   --   --   --   --  0.47 0.48  --  0.26  --  0.23 0.26  INR 1.2  --   --   --   --   --   --  1.2  --  1.3*  --   --   TSH  --   --  1.453  --   --   --   --   --   --   --   --   --   AMMONIA 49*  --   --   --  30  --   --   --   --   --   --   --   BNP 58.9  --   --   --   --  122.9* 663.6*  --  167.3*  --  233.2* 83.3  MG  --    < >  --    < >  --  2.1 2.0  --  1.7  --  2.0 1.9  CALCIUM  7.9*  --   --    < >  --  8.1* 7.8*  --  8.2*  --  7.8* 7.4*   < > = values in this interval not displayed.    --------------------------------------------------------------------------------------------------------------- Lab Results  Component Value Date   CHOL 301 (H) 09/10/2022   HDL 78 09/10/2022   LDLCALC 207 (H) 09/10/2022   TRIG 82 09/10/2022   CHOLHDL 3.9 09/10/2022    Lab  Results  Component Value Date   HGBA1C 5.4 09/10/2022   No results for input(s): TSH, T4TOTAL, FREET4, T3FREE, THYROIDAB in the last 72 hours.  No results for input(s): VITAMINB12, FOLATE, FERRITIN, TIBC, IRON, RETICCTPCT in the last 72 hours.  ------------------------------------------------------------------------------------------------------------------ Cardiac Enzymes No results for input(s): CKMB, TROPONINI, MYOGLOBIN in the last 168 hours.  Invalid input(s): CK  Micro Results No results found for this or any previous visit (from the past 240 hours).  Radiology Report DG Chest Port 1 View Result Date: 12/30/2023 CLINICAL DATA:  58 year old male with shortness of breath. EXAM: PORTABLE CHEST 1 VIEW COMPARISON:  Portable chest yesterday and earlier. FINDINGS: Portable AP semi upright view at 0655 hours. Continued low lung volumes. Normal cardiac size and mediastinal contours. Visualized tracheal air column is within normal limits. No pneumothorax, pleural effusion, consolidation, or pulmonary edema. Mild lung base atelectasis, more apparent on the right. No acute osseous abnormality identified. Negative visible bowel gas. IMPRESSION: Low lung volumes with right greater than left lung base atelectasis. Electronically Signed   By: Marlise Simpers M.D.   On: 12/30/2023 07:13   DG Abd Portable 1V Result Date: 12/29/2023 CLINICAL DATA:  Nausea EXAM: PORTABLE ABDOMEN - 1 VIEW COMPARISON:  None Available. FINDINGS: The bowel gas pattern is normal. No radio-opaque calculi or other significant radiographic abnormality are seen. IMPRESSION: Negative. Electronically Signed   By: Fredrich Jefferson M.D.   On: 12/29/2023 11:39     Signature  -   Lynnwood Sauer M.D on 12/31/2023 at 10:21 AM   -  To page go to [email or web address]

## 2024-01-01 DIAGNOSIS — R5383 Other fatigue: Secondary | ICD-10-CM | POA: Diagnosis not present

## 2024-01-01 LAB — CBC WITH DIFFERENTIAL/PLATELET
Abs Immature Granulocytes: 0.03 10*3/uL (ref 0.00–0.07)
Basophils Absolute: 0 10*3/uL (ref 0.0–0.1)
Basophils Relative: 1 %
Eosinophils Absolute: 0.1 10*3/uL (ref 0.0–0.5)
Eosinophils Relative: 2 %
HCT: 30.3 % — ABNORMAL LOW (ref 39.0–52.0)
Hemoglobin: 10.3 g/dL — ABNORMAL LOW (ref 13.0–17.0)
Immature Granulocytes: 1 %
Lymphocytes Relative: 20 %
Lymphs Abs: 0.9 10*3/uL (ref 0.7–4.0)
MCH: 34.1 pg — ABNORMAL HIGH (ref 26.0–34.0)
MCHC: 34 g/dL (ref 30.0–36.0)
MCV: 100.3 fL — ABNORMAL HIGH (ref 80.0–100.0)
Monocytes Absolute: 0.4 10*3/uL (ref 0.1–1.0)
Monocytes Relative: 8 %
Neutro Abs: 3.1 10*3/uL (ref 1.7–7.7)
Neutrophils Relative %: 68 %
Platelets: 157 10*3/uL (ref 150–400)
RBC: 3.02 MIL/uL — ABNORMAL LOW (ref 4.22–5.81)
RDW: 18.4 % — ABNORMAL HIGH (ref 11.5–15.5)
WBC: 4.6 10*3/uL (ref 4.0–10.5)
nRBC: 0 % (ref 0.0–0.2)

## 2024-01-01 LAB — COMPREHENSIVE METABOLIC PANEL WITH GFR
ALT: 44 U/L (ref 0–44)
AST: 65 U/L — ABNORMAL HIGH (ref 15–41)
Albumin: <1.5 g/dL — ABNORMAL LOW (ref 3.5–5.0)
Alkaline Phosphatase: 91 U/L (ref 38–126)
Anion gap: 6 (ref 5–15)
BUN: 8 mg/dL (ref 6–20)
CO2: 25 mmol/L (ref 22–32)
Calcium: 7.4 mg/dL — ABNORMAL LOW (ref 8.9–10.3)
Chloride: 99 mmol/L (ref 98–111)
Creatinine, Ser: 0.79 mg/dL (ref 0.61–1.24)
GFR, Estimated: >60 mL/min (ref >60)
Glucose, Bld: 96 mg/dL (ref 70–99)
Potassium: 3 mmol/L — ABNORMAL LOW (ref 3.5–5.1)
Sodium: 130 mmol/L — ABNORMAL LOW (ref 135–145)
Total Bilirubin: 1 mg/dL (ref 0.0–1.2)
Total Protein: 5.6 g/dL — ABNORMAL LOW (ref 6.5–8.1)

## 2024-01-01 LAB — PHOSPHORUS: Phosphorus: 3 mg/dL (ref 2.5–4.6)

## 2024-01-01 LAB — MAGNESIUM: Magnesium: 1.8 mg/dL (ref 1.7–2.4)

## 2024-01-01 MED ORDER — CLONIDINE HCL 0.1 MG PO TABS: 0.1000 mg | ORAL_TABLET | Freq: Four times a day (QID) | ORAL | Status: DC | PRN

## 2024-01-01 MED ORDER — POTASSIUM CHLORIDE 10 MEQ/100ML IV SOLN
10.0000 meq | INTRAVENOUS | Status: AC
  Administered 2024-01-01 (×3): 10 meq via INTRAVENOUS
  Filled 2024-01-01 (×3): qty 100

## 2024-01-01 MED ORDER — POTASSIUM CHLORIDE CRYS ER 20 MEQ PO TBCR
40.0000 meq | EXTENDED_RELEASE_TABLET | Freq: Once | ORAL | Status: AC
  Administered 2024-01-01: 40 meq via ORAL
  Filled 2024-01-01: qty 2

## 2024-01-01 MED ORDER — LACTATED RINGERS IV SOLN: INTRAVENOUS | Status: DC

## 2024-01-01 MED ORDER — POTASSIUM CHLORIDE 10 MEQ/100ML IV SOLN
10.0000 meq | Freq: Once | INTRAVENOUS | Status: AC
  Administered 2024-01-01: 10 meq via INTRAVENOUS
  Filled 2024-01-01: qty 100

## 2024-01-01 NOTE — Progress Notes (Signed)
 PROGRESS NOTE                                                                                                                                                                                                             Patient Demographics:    Jeffrey Hodges, is a 58 y.o. male, DOB - 07-08-1966, ZOX:[id number]  Outpatient Primary MD for the patient is Dorothe Gaster, NP    LOS - 6  Admit date - 12/25/2023    Chief Complaint  Patient presents with   Alcohol Problem       Brief Narrative (HPI from H&P)   58 y.o. male with past medical history  of heart disease status post stent, heavy alcohol abuse, anemia, AKI, depression anxiety, elevated LFTs, hyperlipidemia, tobacco abuse, essential hypertension, was brought into the hospital due to less responsiveness and abnormal behavior.  Patient sees GI and cardiology.  It is being suspected that patient drinks 2 bottles of liquor essentially every day.  Patient was admitted with hepatic encephalopathy.    Subjective:   Patient in bed, appears comfortable, still mildly confused, denies any headache, no fever, no chest pain or pressure, no shortness of breath , no abdominal pain. No new focal weakness.   Assessment  & Plan :   Acute metabolic encephalopathy, suspicious for Wernicke's encephalopathy and now after 4 days going into DTs: Patient was admitted with ammonia levels of around 49 which is close to his baseline, ammonia levels have trended down, question if he is developing Wernicke's encephalopathy, placed on high-dose IV thiamine , CIWA protocol and Librium,   Continue IV fluids, PT and speech eval and monitor.  Stable MRI brain, EEG, RPR TSH and B12 stable.  No headache or focal deficits, stable thiamine  levels.   Alcohol dependence: Consult to quit, CIWA protocol and Librium.   Dehydration, hypokalemia, hypophosphatemia: Continue hydration with IV fluids, electrolytes  replaced  Alcoholic hepatitis/elevated LFTs: Mild, continue to monitor, INR is relatively stable.   Chronic hyponatremia: Baseline low 130s.  Currently at baseline.  Secondary to alcoholism.   History of CAD status post stent, elevated troponin, it was mild and nonspecific: Stent placed in February 2024.  Troponin elevated but flat.  Patient with no ACS symptoms.  Likely demand ischemia.  Looks like he was on DAPT for a year after cath.  Continue aspirin ,  echocardiogram is nonacute with preserved EF of 55% and no wall motion abnormality.   Anxiety/depression: Resuming Prozac  but holding BuSpar  for now.   Hyperlipidemia: Resume statin.  Looks like he is not taking at home.  Bladder outlet obstruction.  Foley, Flomax, ultrasound stable.  Some hematuria.  Stable bladder scan and renal ultrasound, Foley in place, bladder decompressed, likely due to catheter placement injury, continue hydration and monitor.  Also likely borderline UTI, 5 days of Rocephin.       Condition - Extremely Guarded  Family Communication  : Parents bedside on 12/27/2023, 12/29/2023, Jeffrey Hodges over the  [phone]  12/27/2023, 12/28/2023, 12/30/2023  Code Status : Full code  Consults  :  None  PUD Prophylaxis :  PPI   Procedures  :     TTE - 1. Left ventricular ejection fraction, by estimation, is 55 to 60%. The left ventricle has normal function. The left ventricle has no regional wall motion abnormalities. There is mild left ventricular hypertrophy. Left ventricular diastolic parameters were normal.  2. Right ventricular systolic function is normal. The right ventricular size is normal. Tricuspid regurgitation signal is inadequate for assessing PA pressure.  3. The mitral valve is grossly normal. Trivial mitral valve regurgitation. No evidence of mitral stenosis.  4. The aortic valve is grossly normal. Aortic valve regurgitation is not visualized. No aortic stenosis is present.  5. The inferior vena cava is normal  in size with <50% respiratory variability, suggesting right atrial pressure of 8 mmHg.  EEG - no active seizures.  MRI - 1. Motion degraded exam. 2. No acute intracranial abnormality. 3. Mild chronic microvascular ischemic disease for age.      Disposition Plan  :    Status is: Inpatient    DVT Prophylaxis  :  Heparin   heparin  injection 5,000 Units Start: 12/27/23 1400 SCDs Start: 12/25/23 1909    Lab Results  Component Value Date   PLT 157 01/01/2024    Diet :  Diet Order             DIET SOFT Fluid consistency: Thin  Diet effective now                    Inpatient Medications  Scheduled Meds:  aspirin  EC  81 mg Oral Daily   chlordiazePOXIDE  15 mg Oral TID   Chlorhexidine Gluconate Cloth  6 each Topical Daily   cloNIDine  0.2 mg Transdermal Weekly   FLUoxetine   40 mg Oral Daily   folic acid   1 mg Oral Daily   heparin  injection (subcutaneous)  5,000 Units Subcutaneous Q8H   lactulose   30 g Oral BID   multivitamin with minerals  1 tablet Oral Daily   nicotine   21 mg Transdermal Daily   pantoprazole  40 mg Oral Daily   tamsulosin  0.4 mg Oral Daily   Continuous Infusions:  cefTRIAXone (ROCEPHIN)  IV 1 g (12/31/23 1220)   lactated ringers  50 mL/hr at 01/01/24 1275   potassium chloride  10 mEq (01/01/24 0628)   thiamine  (VITAMIN B1) injection 500 mg (12/31/23 1254)   PRN Meds:.hydrALAZINE , LORazepam  **OR** LORazepam , nitroGLYCERIN , sodium chloride  flush  Antibiotics  :    Anti-infectives (From admission, onward)    Start     Dose/Rate Route Frequency Ordered Stop   12/30/23 1115  cefTRIAXone (ROCEPHIN) 1 g in sodium chloride  0.9 % 100 mL IVPB        1 g 200 mL/hr over 30 Minutes Intravenous Every 24 hours  12/30/23 1020 01/04/24 1114         Objective:   Vitals:   12/31/23 1900 12/31/23 1948 01/01/24 0000 01/01/24 0400  BP:  121/76 126/77   Pulse:  89 83   Resp: 18 16 18 18   Temp:  98.2 F (36.8 C) 98.6 F (37 C)   TempSrc:  Oral Oral    SpO2:  99% 99%   Weight:      Height:        Wt Readings from Last 3 Encounters:  12/30/23 88.6 kg  10/20/23 90.3 kg  05/13/23 89.9 kg     Intake/Output Summary (Last 24 hours) at 01/01/2024 0800 Last data filed at 12/31/2023 1800 Gross per 24 hour  Intake 210.78 ml  Output 1550 ml  Net -1339.22 ml     Physical Exam  In bed, awake but mildly confused, no focal deficits moves all 4 extremities, Foley in place .AT,PERRAL Supple Neck, No JVD,   Symmetrical Chest wall movement, Good air movement bilaterally, CTAB RRR,No Gallops,Rubs or new Murmurs,  +ve B.Sounds, Abd Soft, No tenderness,   No Cyanosis, Clubbing or edema       Data Review:    Recent Labs  Lab 12/28/23 0749 12/29/23 0533 12/30/23 0500 12/31/23 0538 01/01/24 0415  WBC 9.9 10.0 6.6 5.7 4.6  HGB 11.6* 11.5* 11.1* 10.7* 10.3*  HCT 35.0* 33.2* 32.4* 31.2* 30.3*  PLT 136* 131* 146* 124* 157  MCV 105.4* 100.0 101.3* 99.7 100.3*  MCH 34.9* 34.6* 34.7* 34.2* 34.1*  MCHC 33.1 34.6 34.3 34.3 34.0  RDW 19.3* 19.0* 19.0* 18.9* 18.4*  LYMPHSABS 1.5 1.3 1.2 1.2 0.9  MONOABS 0.8 1.0 0.5 0.5 0.4  EOSABS 0.2 0.1 0.1 0.1 0.1  BASOSABS 0.0 0.0 0.0 0.0 0.0    Recent Labs  Lab 12/25/23 1338 12/25/23 1338 12/25/23 1531 12/25/23 1912 12/26/23 0717 12/26/23 1651 12/27/23 0735 12/28/23 0749 12/28/23 1200 12/29/23 0533 12/29/23 0601 12/30/23 0500 12/31/23 0538 01/01/24 0415  NA 131*  --   --   --    < >  --  136 137  --  136  --  135 131* 130*  K 2.8*  --   --   --    < >  --  3.0* 3.4*  --  3.2*  --  3.6 3.6 3.0*  CL 81*  --   --   --    < >  --  93* 98  --  98  --  101 98 99  CO2 35*  --   --   --    < >  --  32 26  --  25  --  28 26 25   ANIONGAP 15  --   --   --    < >  --  11 13  --  13  --  6 7 6   GLUCOSE 118*  --   --   --    < >  --  113* 117*  --  116*  --  110* 103* 96  BUN 9  --   --   --    < >  --  13 10  --  11  --  10 10 8   CREATININE 0.93  --   --   --    < >  --  0.66 0.59*  --  0.81   --  0.75 0.84 0.79  AST 142*  --   --   --    < >  --  99* 79*  --  95*  --  75* 67* 65*  ALT 55*  --   --   --    < >  --  50* 44  --  55*  --  47* 46* 44  ALKPHOS 94  --   --   --    < >  --  78 79  --  85  --  88 94 91  BILITOT 2.5*  --   --   --    < >  --  1.7* 1.2  --  1.3*  --  1.1 1.2 1.0  ALBUMIN 1.8*  --   --   --    < >  --  1.7* 1.5*  --  1.7*  --  1.5* <1.5* <1.5*  CRP  --   --   --   --   --   --  3.2* 2.7*  --  2.9*  --  3.4* 3.0*  --   PROCALCITON  --   --   --   --   --   --  0.47 0.48  --  0.26  --  0.23 0.26  --   INR 1.2  --   --   --   --   --   --   --  1.2  --  1.3*  --   --   --   TSH  --   --   --  1.453  --   --   --   --   --   --   --   --   --   --   AMMONIA 49*  --   --   --   --  30  --   --   --   --   --   --   --   --   BNP 58.9  --   --   --   --   --  122.9* 663.6*  --  167.3*  --  233.2* 83.3  --   MG  --   --    < >  --    < >  --  2.1 2.0  --  1.7  --  2.0 1.9 1.8  PHOS  --    < >  --   --   --   --  1.9* 3.0  --  2.3*  --  3.7 3.6 3.0  CALCIUM  7.9*  --   --   --    < >  --  8.1* 7.8*  --  8.2*  --  7.8* 7.4* 7.4*   < > = values in this interval not displayed.      Recent Labs  Lab 12/25/23 1338 12/25/23 1531 12/25/23 1912 12/26/23 0717 12/26/23 1651 12/27/23 0735 12/28/23 0749 12/28/23 1200 12/29/23 0533 12/29/23 0601 12/30/23 0500 12/31/23 0538 01/01/24 0415  CRP  --   --   --   --   --  3.2* 2.7*  --  2.9*  --  3.4* 3.0*  --   PROCALCITON  --   --   --   --   --  0.47 0.48  --  0.26  --  0.23 0.26  --   INR 1.2  --   --   --   --   --   --  1.2  --  1.3*  --   --   --   TSH  --   --  1.453  --   --   --   --   --   --   --   --   --   --   AMMONIA 49*  --   --   --  30  --   --   --   --   --   --   --   --   BNP 58.9  --   --   --   --  122.9* 663.6*  --  167.3*  --  233.2* 83.3  --   MG  --    < >  --    < >  --  2.1 2.0  --  1.7  --  2.0 1.9 1.8  CALCIUM  7.9*  --   --    < >  --  8.1* 7.8*  --  8.2*  --  7.8* 7.4* 7.4*   < > =  values in this interval not displayed.    --------------------------------------------------------------------------------------------------------------- Lab Results  Component Value Date   CHOL 301 (H) 09/10/2022   HDL 78 09/10/2022   LDLCALC 207 (H) 09/10/2022   TRIG 82 09/10/2022   CHOLHDL 3.9 09/10/2022    Lab Results  Component Value Date   HGBA1C 5.4 09/10/2022   No results for input(s): TSH, T4TOTAL, FREET4, T3FREE, THYROIDAB in the last 72 hours.  No results for input(s): VITAMINB12, FOLATE, FERRITIN, TIBC, IRON, RETICCTPCT in the last 72 hours.  ------------------------------------------------------------------------------------------------------------------ Cardiac Enzymes No results for input(s): CKMB, TROPONINI, MYOGLOBIN in the last 168 hours.  Invalid input(s): CK  Micro Results No results found for this or any previous visit (from the past 240 hours).  Radiology Report No results found.    Signature  -   Lynnwood Sauer M.D on 01/01/2024 at 8:00 AM   -  To page go to [email or web address]

## 2024-01-01 NOTE — Progress Notes (Signed)
 Speech Language Pathology Treatment: Dysphagia  Patient Details Name: Jeffrey Hodges MRN: [id number] DOB: 03/11/1966 Today's Date: 01/01/2024 Time: 1212-1222 SLP Time Calculation (min) (ACUTE ONLY): 10 min  Assessment / Plan / Recommendation Clinical Impression  Pt seen for ongoing dysphagia management.  Pt awake, alert, seated upright in bedside chair.  Pt reports poor appetite, but states this is baseline.  Visitor confirms.  Pt tolerated thin liquid by straw, puree, and regular solid.  Pt exhibited adequate oral clearance with independent use of liquid wash.  There was a single cough during session which was seemingly unrelated to POs.  Pt without throat clearing observed during prior session.  Recommend continuing current diet.  Pt has no further ST needs.  SLP will sign off, please reconsult if there is a change in functional status.  Recommend regular texture solids with thin liquids.     HPI HPI: 58 y.o. male brought into the hospital 6/12 due to less responsiveness and abnormal behavior. Patient was admitted with hepatic encephalopathy. PMH: heart disease status post stent, heavy alcohol abuse, anemia, AKI, depression anxiety, elevated LFTs, hyperlipidemia, tobacco abuse, essential hypertension.      SLP Plan  Discharge SLP treatment due to (comment);All goals met          Recommendations  Diet recommendations: Regular;Thin liquid Liquids provided via: Cup;Straw Medication Administration:  (As tolerated, no specific precautions) Supervision: Patient able to self feed Compensations: Slow rate;Small sips/bites Postural Changes and/or Swallow Maneuvers: Seated upright 90 degrees                  Oral care BID   None Dysphagia, unspecified (R13.10)     Discharge SLP treatment due to (comment);All goals met     Jeffrey Grim, MA, CCC-SLP Acute Rehabilitation Services Office: [phone] 01/01/2024, 12:28 PM

## 2024-01-01 NOTE — Plan of Care (Signed)
 Pt has rested quietly throughout the night with no distress noted. Alert and oriented to person. Calls out for family members thinking they are here. Moves all extremities well. On room air. SR on the monitor. Left upper arm midline intact with dressing CDI. Incontinent of stool , cleaned up and linen's changed. Pt medicated with ativan  once with relief noted. Foley intact to BSD. No complaints voiced.     Problem: Clinical Measurements: Goal: Respiratory complications will improve Outcome: Progressing Goal: Cardiovascular complication will be avoided Outcome: Progressing   Problem: Activity: Goal: Risk for activity intolerance will decrease Outcome: Progressing   Problem: Coping: Goal: Level of anxiety will decrease Outcome: Progressing   Problem: Pain Managment: Goal: General experience of comfort will improve and/or be controlled Outcome: Progressing

## 2024-01-01 NOTE — Progress Notes (Signed)
 Physical Therapy Treatment Patient Details Name: Jeffrey Hodges MRN: [id number] DOB: [birth year] Today's Date: 01/01/2024   History of Present Illness 58 y.o. male brought into the hospital 6/12 due to less responsiveness and abnormal behavior. Patient was admitted with hepatic encephalopathy. PMH: heart disease status post stent, heavy alcohol abuse, anemia, AKI, depression anxiety, elevated LFTs, hyperlipidemia, tobacco abuse, essential hypertension.    PT Comments  Eager to mobilize out of bed. Bowel urgency, assisted to [facility]. Mod assist for transfer and steps to [facility], Followed by Mod assist to ambulate short distance in room. Up to mod assist for ataxic gait pattern with frequent LOB and erratic RW control. Patient will continue to benefit from skilled physical therapy services to further improve independence with functional mobility. Patient will benefit from continued inpatient follow up therapy, <3 hours/day      If plan is discharge home, recommend the following: A lot of help with walking and/or transfers;A lot of help with bathing/dressing/bathroom;Assistance with cooking/housework;Direct supervision/assist for medications management;Direct supervision/assist for financial management;Help with stairs or ramp for entrance;Assist for transportation;Supervision due to cognitive status   Can travel by private vehicle     Yes  Equipment Recommendations  Other (comment) (TBD)    Recommendations for Other Services       Precautions / Restrictions Precautions Precautions: Fall Restrictions Weight Bearing Restrictions Per Provider Order: No     Mobility  Bed Mobility Overal bed mobility: Needs Assistance Bed Mobility: Supine to Sit     Supine to sit: Contact guard, HOB elevated, Used rails     General bed mobility comments: CGA for safety, extra time, assist to safely manage lines/leads    Transfers Overall transfer level: Needs assistance Equipment used: Rolling walker (2  wheels) Transfers: Sit to/from Stand Sit to Stand: Mod assist           General transfer comment: Up to mod assist for boost and balance upon standing with multidirectional instability despite RW use and cues for technique.    Ambulation/Gait Ambulation/Gait assistance: Mod assist Gait Distance (Feet): 5 Feet (+12) Assistive device: Rolling walker (2 wheels) Gait Pattern/deviations: Step-through pattern, Decreased stride length, Staggering left, Staggering right, Drifts right/left, Wide base of support, Ataxic, Shuffle, Scissoring Gait velocity: dec Gait velocity interpretation: <1.31 ft/sec, indicative of household ambulator   General Gait Details: Gross instability with multidirectional LOB, difficulty controlling RW safely. Mod assist for balance, sequencine, and RW control. Completed short distance to [facility], then around bed to chair. Max cues throughout. Inconsistent with command following.   Stairs             Wheelchair Mobility     Tilt Bed    Modified Rankin (Stroke Patients Only)       Balance Overall balance assessment: Needs assistance Sitting-balance support: No upper extremity supported, Feet supported Sitting balance-Leahy Scale: Fair Sitting balance - Comments: no LOB while EOB   Standing balance support: Bilateral upper extremity supported, Reliant on assistive device for balance Standing balance-Leahy Scale: Poor Standing balance comment: reliant on RW and up to moderate assist                            Communication Communication Communication: No apparent difficulties  Cognition Arousal: Alert Behavior During Therapy: Flat affect, Impulsive   PT - Cognitive impairments: Awareness, Memory, Attention, Initiation, Sequencing, Problem solving, Safety/Judgement  Following commands: Impaired Following commands impaired: Follows one step commands inconsistently    Cueing Cueing Techniques: Verbal  cues, Gestural cues, Tactile cues  Exercises      General Comments General comments (skin integrity, edema, etc.): Small BM in BSC; assisted with pericare.      Pertinent Vitals/Pain Pain Assessment Pain Assessment: No/denies pain    Home Living                          Prior Function            PT Goals (current goals can now be found in the care plan section) Acute Rehab PT Goals Patient Stated Goal: go home PT Goal Formulation: With patient/family Time For Goal Achievement: 01/10/24 Potential to Achieve Goals: Good Progress towards PT goals: Progressing toward goals    Frequency    Min 3X/week      PT Plan      Co-evaluation              AM-PAC PT 6 Clicks Mobility   Outcome Measure  Help needed turning from your back to your side while in a flat bed without using bedrails?: A Little Help needed moving from lying on your back to sitting on the side of a flat bed without using bedrails?: A Lot Help needed moving to and from a bed to a chair (including a wheelchair)?: A Lot Help needed standing up from a chair using your arms (e.g., wheelchair or bedside chair)?: A Lot Help needed to walk in hospital room?: A Lot Help needed climbing 3-5 steps with a railing? : A Lot 6 Click Score: 13    End of Session Equipment Utilized During Treatment: Gait belt Activity Tolerance: Other (comment) Patient left: with call bell/phone within reach;with family/visitor present;in chair;with chair alarm set Nurse Communication: Mobility status PT Visit Diagnosis: Unsteadiness on feet (R26.81);Other abnormalities of gait and mobility (R26.89);Other symptoms and signs involving the nervous system (R29.898);Ataxic gait (R26.0);History of falling (Z91.81);Muscle weakness (generalized) (M62.81);Repeated falls (R29.6)     Time: 1131-1140 PT Time Calculation (min) (ACUTE ONLY): 9 min  Charges:    $Therapeutic Activity: 8-22 mins PT General Charges $$ ACUTE PT  VISIT: 1 Visit                     Jory Ng, PT, DPT [facility]  Rehabilitation Services Physical Therapist Office: [phone] Website: [email or web address]    Alinda Irani 01/01/2024, 1:04 PM

## 2024-01-01 NOTE — TOC Progression Note (Addendum)
 Transition of Care [facility]) - Progression Note    Patient Details  Name: Jeffrey Hodges MRN: [id number] Date of Birth: 06/10/1966  [facility] CM/SW Contact  Carmon Christen, LCSWA Phone Number: 01/01/2024, 3:33 PM  Clinical Narrative:     Due to patients current orientation CSW LVM for patients spouse [name]. CSW awaiting call back to discuss patients SNF bed offers. CSW will continue to follow.  Update- CSW received call back from [name] patients spouse. [name] informed CSW that patient is wanting to return home when medically stable. [name] informed CSW that she can provide supervision for patient at home. All questions answered. No further questions reported at this time.CSW informed CM.   Expected Discharge Plan: Skilled Nursing Facility Barriers to Discharge: Continued Medical Work up  Expected Discharge Plan and Services In-house Referral: Clinical Social Work     Living arrangements for the past 2 months: Single Family Home                                       Social Determinants of Health (SDOH) Interventions SDOH Screenings   Food Insecurity: No Food Insecurity (12/26/2023)  Housing: Unknown (12/26/2023)  Transportation Needs: No Transportation Needs (12/26/2023)  Utilities: Not At Risk (12/26/2023)  Depression (PHQ2-9): High Risk (05/13/2023)  Financial Resource Strain: Medium Risk (03/24/2023)  Social Connections: Unknown (03/24/2023)  Stress: Stress Concern Present (03/24/2023)  Tobacco Use: Medium Risk (12/25/2023)    Readmission Risk Interventions     No data to display

## 2024-01-02 ENCOUNTER — Inpatient Hospital Stay (HOSPITAL_COMMUNITY)

## 2024-01-02 DIAGNOSIS — R5383 Other fatigue: Secondary | ICD-10-CM | POA: Diagnosis not present

## 2024-01-02 LAB — CBC WITH DIFFERENTIAL/PLATELET
Abs Immature Granulocytes: 0.04 10*3/uL (ref 0.00–0.07)
Basophils Absolute: 0 10*3/uL (ref 0.0–0.1)
Basophils Relative: 1 %
Eosinophils Absolute: 0.1 10*3/uL (ref 0.0–0.5)
Eosinophils Relative: 2 %
HCT: 33.2 % — ABNORMAL LOW (ref 39.0–52.0)
Hemoglobin: 11.4 g/dL — ABNORMAL LOW (ref 13.0–17.0)
Immature Granulocytes: 1 %
Lymphocytes Relative: 16 %
Lymphs Abs: 0.9 10*3/uL (ref 0.7–4.0)
MCH: 34.7 pg — ABNORMAL HIGH (ref 26.0–34.0)
MCHC: 34.3 g/dL (ref 30.0–36.0)
MCV: 100.9 fL — ABNORMAL HIGH (ref 80.0–100.0)
Monocytes Absolute: 0.5 10*3/uL (ref 0.1–1.0)
Monocytes Relative: 10 %
Neutro Abs: 3.9 10*3/uL (ref 1.7–7.7)
Neutrophils Relative %: 70 %
Platelets: 164 10*3/uL (ref 150–400)
RBC: 3.29 MIL/uL — ABNORMAL LOW (ref 4.22–5.81)
RDW: 18.6 % — ABNORMAL HIGH (ref 11.5–15.5)
WBC: 5.5 10*3/uL (ref 4.0–10.5)
nRBC: 0 % (ref 0.0–0.2)

## 2024-01-02 LAB — COMPREHENSIVE METABOLIC PANEL WITH GFR
ALT: 52 U/L — ABNORMAL HIGH (ref 0–44)
AST: 78 U/L — ABNORMAL HIGH (ref 15–41)
Albumin: <1.5 g/dL — ABNORMAL LOW (ref 3.5–5.0)
Alkaline Phosphatase: 104 U/L (ref 38–126)
Anion gap: 9 (ref 5–15)
BUN: 8 mg/dL (ref 6–20)
CO2: 23 mmol/L (ref 22–32)
Calcium: 7.8 mg/dL — ABNORMAL LOW (ref 8.9–10.3)
Chloride: 100 mmol/L (ref 98–111)
Creatinine, Ser: 0.74 mg/dL (ref 0.61–1.24)
GFR, Estimated: >60 mL/min (ref >60)
Glucose, Bld: 79 mg/dL (ref 70–99)
Potassium: 3.7 mmol/L (ref 3.5–5.1)
Sodium: 132 mmol/L — ABNORMAL LOW (ref 135–145)
Total Bilirubin: 1 mg/dL (ref 0.0–1.2)
Total Protein: 6.2 g/dL — ABNORMAL LOW (ref 6.5–8.1)

## 2024-01-02 LAB — SODIUM, URINE, RANDOM: Sodium, Ur: <10 mmol/L

## 2024-01-02 LAB — OSMOLALITY, URINE: Osmolality, Ur: 776 mosm/kg (ref 300–900)

## 2024-01-02 LAB — CREATININE, URINE, RANDOM: Creatinine, Urine: 322 mg/dL

## 2024-01-02 LAB — OSMOLALITY: Osmolality: 275 mosm/kg (ref 275–295)

## 2024-01-02 LAB — MAGNESIUM: Magnesium: 1.9 mg/dL (ref 1.7–2.4)

## 2024-01-02 LAB — PHOSPHORUS: Phosphorus: 2.6 mg/dL (ref 2.5–4.6)

## 2024-01-02 LAB — URIC ACID: Uric Acid, Serum: 5.4 mg/dL (ref 3.7–8.6)

## 2024-01-02 MED ORDER — POTASSIUM CHLORIDE CRYS ER 20 MEQ PO TBCR
40.0000 meq | EXTENDED_RELEASE_TABLET | Freq: Once | ORAL | Status: AC
  Administered 2024-01-02: 40 meq via ORAL
  Filled 2024-01-02: qty 2

## 2024-01-02 MED ORDER — FUROSEMIDE 10 MG/ML IJ SOLN
40.0000 mg | Freq: Once | INTRAMUSCULAR | Status: AC
  Administered 2024-01-02: 40 mg via INTRAVENOUS
  Filled 2024-01-02: qty 4

## 2024-01-02 NOTE — Plan of Care (Signed)
 Pt has rested quietly throughout the night with no distress noted. Alert and oriented to self. On room air. SR on the monitor. Foley intact to BSD amber urine. Pt had 2 large loose BM's. Skin care with linens changed. Pt pulls leads off frequently. Moves around in the bed. Hangs legs off side of bed. No complaints voiced.     Problem: Clinical Measurements: Goal: Respiratory complications will improve Outcome: Progressing Goal: Cardiovascular complication will be avoided Outcome: Progressing   Problem: Coping: Goal: Level of anxiety will decrease Outcome: Progressing   Problem: Pain Managment: Goal: General experience of comfort will improve and/or be controlled Outcome: Progressing

## 2024-01-02 NOTE — Progress Notes (Signed)
 OT Cancellation Note  Patient Details Name: Jeffrey Hodges MRN: [id number] DOB: [birth year]   Cancelled Treatment:    Reason Eval/Treat Not Completed: Patient's level of consciousness;Fatigue/lethargy limiting ability to participate Attempted to see pt for skilled OT treatment. Pt in bed with eyes closed. Occasionally answered OT's questions although extremely lethargic and unable to wake up to participate in treatment session. Will continue to follow pt and re-attempt OT treatment when able.   Carollee Circle, OTR/L,CBIS  Supplemental OT - [facility] and [facility] Secure Chat Preferred   01/02/2024, 11:38 AM

## 2024-01-02 NOTE — Plan of Care (Signed)
  Problem: Education: Goal: Knowledge of General Education information will improve Description: Including pain rating scale, medication(s)/side effects and non-pharmacologic comfort measures Outcome: Progressing   Problem: Health Behavior/Discharge Planning: Goal: Ability to manage health-related needs will improve Outcome: Progressing   Problem: Clinical Measurements: Goal: Ability to maintain clinical measurements within normal limits will improve Outcome: Progressing Goal: Will remain free from infection Outcome: Progressing Goal: Diagnostic test results will improve Outcome: Progressing Goal: Respiratory complications will improve Outcome: Not Applicable   Problem: Activity: Goal: Risk for activity intolerance will decrease Outcome: Not Progressing   Problem: Nutrition: Goal: Adequate nutrition will be maintained Outcome: Not Progressing

## 2024-01-02 NOTE — Progress Notes (Signed)
 PROGRESS NOTE                                                                                                                                                                                                             Patient Demographics:    Jeffrey Hodges, is a 58 y.o. male, DOB - 03/03/1966, NFA:[id number]  Outpatient Primary MD for the patient is Dorothe Gaster, NP    LOS - 7  Admit date - 12/25/2023    Chief Complaint  Patient presents with   Alcohol Problem       Brief Narrative (HPI from H&P)   58 y.o. male with past medical history  of heart disease status post stent, heavy alcohol abuse, anemia, AKI, depression anxiety, elevated LFTs, hyperlipidemia, tobacco abuse, essential hypertension, was brought into the hospital due to less responsiveness and abnormal behavior.  Patient sees GI and cardiology.  It is being suspected that patient drinks 2 bottles of liquor essentially every day.  Patient was admitted with hepatic encephalopathy.    Subjective:   Patient in bed, appears comfortable, denies any headache, no fever, no chest pain or pressure, no shortness of breath , no abdominal pain. No focal weakness.   Assessment  & Plan :   Acute metabolic encephalopathy, suspicious for Wernicke's encephalopathy and now after 4 days going into DTs: Patient was admitted with ammonia levels of around 49 which is close to his baseline, ammonia levels have trended down, question if he is developing Wernicke's encephalopathy, placed on high-dose IV thiamine , CIWA protocol and Librium,   Continue IV fluids, PT and speech eval and monitor.  Stable MRI brain, EEG, RPR TSH and B12 stable.  No headache or focal deficits, stable thiamine  levels however he had received few thiamine  doses prior to it.   Alcohol dependence: Consult to quit, CIWA protocol and Librium.   Dehydration, hypokalemia, hypophosphatemia: Continue hydration with IV  fluids, electrolytes replaced  Alcoholic hepatitis/elevated LFTs: Mild, continue to monitor, INR is relatively stable.   Chronic hyponatremia: Baseline low 130s.  Currently at baseline.  Secondary to alcoholism.   History of CAD status post stent, elevated troponin, it was mild and nonspecific: Stent placed in February 2024.  Troponin elevated but flat.  Patient with no ACS symptoms.  Likely demand ischemia.  Looks like he was on DAPT for a  year after cath.  Continue aspirin , echocardiogram is nonacute with preserved EF of 55% and no wall motion abnormality.   Anxiety/depression: Resuming Prozac  but holding BuSpar  for now.   Hyperlipidemia: Resume statin.  Looks like he is not taking at home.  Bladder outlet obstruction.  Foley, Flomax, ultrasound stable.  Some hematuria.  Stable bladder scan and renal ultrasound, Foley in place, bladder decompressed, likely due to catheter placement injury, continue hydration and monitor.  Also likely borderline UTI, 5 days of Rocephin.       Condition - Extremely Guarded  Family Communication  : Parents bedside on 12/27/2023, 12/29/2023, wife [name] over the  [phone]  12/27/2023, 12/28/2023, 12/30/2023  Code Status : Full code  Consults  :  None  PUD Prophylaxis :  PPI   Procedures  :     TTE - 1. Left ventricular ejection fraction, by estimation, is 55 to 60%. The left ventricle has normal function. The left ventricle has no regional wall motion abnormalities. There is mild left ventricular hypertrophy. Left ventricular diastolic parameters were normal.  2. Right ventricular systolic function is normal. The right ventricular size is normal. Tricuspid regurgitation signal is inadequate for assessing PA pressure.  3. The mitral valve is grossly normal. Trivial mitral valve regurgitation. No evidence of mitral stenosis.  4. The aortic valve is grossly normal. Aortic valve regurgitation is not visualized. No aortic stenosis is present.  5. The inferior  vena cava is normal in size with <50% respiratory variability, suggesting right atrial pressure of 8 mmHg.  EEG - no active seizures.  MRI - 1. Motion degraded exam. 2. No acute intracranial abnormality. 3. Mild chronic microvascular ischemic disease for age.      Disposition Plan  :    Status is: Inpatient    DVT Prophylaxis  :  Heparin   heparin  injection 5,000 Units Start: 12/27/23 1400 SCDs Start: 12/25/23 1909    Lab Results  Component Value Date   PLT 164 01/02/2024    Diet :  Diet Order             DIET SOFT Fluid consistency: Thin  Diet effective now                    Inpatient Medications  Scheduled Meds:  aspirin  EC  81 mg Oral Daily   chlordiazePOXIDE  15 mg Oral TID   Chlorhexidine Gluconate Cloth  6 each Topical Daily   FLUoxetine   40 mg Oral Daily   folic acid   1 mg Oral Daily   heparin  injection (subcutaneous)  5,000 Units Subcutaneous Q8H   lactulose   30 g Oral BID   multivitamin with minerals  1 tablet Oral Daily   nicotine   21 mg Transdermal Daily   pantoprazole  40 mg Oral Daily   tamsulosin  0.4 mg Oral Daily   Continuous Infusions:  cefTRIAXone (ROCEPHIN)  IV 1 g (01/01/24 1044)   thiamine  (VITAMIN B1) injection 500 mg (01/01/24 1206)   PRN Meds:.cloNIDine, hydrALAZINE , nitroGLYCERIN , sodium chloride  flush  Antibiotics  :    Anti-infectives (From admission, onward)    Start     Dose/Rate Route Frequency Ordered Stop   12/30/23 1115  cefTRIAXone (ROCEPHIN) 1 g in sodium chloride  0.9 % 100 mL IVPB        1 g 200 mL/hr over 30 Minutes Intravenous Every 24 hours 12/30/23 1020 01/04/24 1114         Objective:   Vitals:   01/01/24 2010 01/02/24  0250 01/02/24 0600 01/02/24 0815  BP: 121/66   119/76  Pulse: 87   87  Resp: 17  18 (!) 22  Temp: 97.9 F (36.6 C) 98 F (36.7 C)  97.6 F (36.4 C)  TempSrc: Oral Oral  Oral  SpO2: 93%   100%  Weight:   91.4 kg   Height:        Wt Readings from Last 3 Encounters:  01/02/24  91.4 kg  10/20/23 90.3 kg  05/13/23 89.9 kg     Intake/Output Summary (Last 24 hours) at 01/02/2024 0919 Last data filed at 01/02/2024 0600 Gross per 24 hour  Intake 240 ml  Output 450 ml  Net -210 ml     Physical Exam  In bed, awake but mildly confused, no focal deficits moves all 4 extremities, Foley in place [place].AT,PERRAL Supple Neck, No JVD,   Symmetrical Chest wall movement, Good air movement bilaterally, CTAB RRR,No Gallops,Rubs or new Murmurs,  +ve B.Sounds, Abd Soft, No tenderness,   No Cyanosis, Clubbing or edema       Data Review:    Recent Labs  Lab 12/29/23 0533 12/30/23 0500 12/31/23 0538 01/01/24 0415 01/02/24 0744  WBC 10.0 6.6 5.7 4.6 5.5  HGB 11.5* 11.1* 10.7* 10.3* 11.4*  HCT 33.2* 32.4* 31.2* 30.3* 33.2*  PLT 131* 146* 124* 157 164  MCV 100.0 101.3* 99.7 100.3* 100.9*  MCH 34.6* 34.7* 34.2* 34.1* 34.7*  MCHC 34.6 34.3 34.3 34.0 34.3  RDW 19.0* 19.0* 18.9* 18.4* 18.6*  LYMPHSABS 1.3 1.2 1.2 0.9 0.9  MONOABS 1.0 0.5 0.5 0.4 0.5  EOSABS 0.1 0.1 0.1 0.1 0.1  BASOSABS 0.0 0.0 0.0 0.0 0.0    Recent Labs  Lab 12/26/23 1200 12/26/23 1651 12/27/23 0735 12/28/23 0749 12/28/23 1200 12/29/23 0533 12/29/23 0601 12/30/23 0500 12/31/23 0538 01/01/24 0415 01/02/24 0744  NA   < >  --  136 137  --  136  --  135 131* 130* 132*  K   < >  --  3.0* 3.4*  --  3.2*  --  3.6 3.6 3.0* 3.7  CL   < >  --  93* 98  --  98  --  101 98 99 100  CO2   < >  --  32 26  --  25  --  28 26 25 23   ANIONGAP   < >  --  11 13  --  13  --  6 7 6 9   GLUCOSE   < >  --  113* 117*  --  116*  --  110* 103* 96 79  BUN   < >  --  13 10  --  11  --  10 10 8 8   CREATININE   < >  --  0.66 0.59*  --  0.81  --  0.75 0.84 0.79 0.74  AST   < >  --  99* 79*  --  95*  --  75* 67* 65* 78*  ALT   < >  --  50* 44  --  55*  --  47* 46* 44 52*  ALKPHOS   < >  --  78 79  --  85  --  88 94 91 104  BILITOT   < >  --  1.7* 1.2  --  1.3*  --  1.1 1.2 1.0 1.0  ALBUMIN   < >  --  1.7* 1.5*  --   1.7*  --  1.5* <  1.5* <1.5* <1.5*  CRP  --   --  3.2* 2.7*  --  2.9*  --  3.4* 3.0*  --   --   PROCALCITON  --   --  0.47 0.48  --  0.26  --  0.23 0.26  --   --   INR  --   --   --   --  1.2  --  1.3*  --   --   --   --   AMMONIA  --  30  --   --   --   --   --   --   --   --   --   BNP  --   --  122.9* 663.6*  --  167.3*  --  233.2* 83.3  --   --   MG  --   --  2.1 2.0  --  1.7  --  2.0 1.9 1.8 1.9  PHOS   < >  --  1.9* 3.0  --  2.3*  --  3.7 3.6 3.0 2.6  CALCIUM    < >  --  8.1* 7.8*  --  8.2*  --  7.8* 7.4* 7.4* 7.8*   < > = values in this interval not displayed.      Recent Labs  Lab 12/26/23 1200 12/26/23 1651 12/27/23 0735 12/28/23 0749 12/28/23 1200 12/29/23 0533 12/29/23 0601 12/30/23 0500 12/31/23 0538 01/01/24 0415 01/02/24 0744  CRP  --   --  3.2* 2.7*  --  2.9*  --  3.4* 3.0*  --   --   PROCALCITON  --   --  0.47 0.48  --  0.26  --  0.23 0.26  --   --   INR  --   --   --   --  1.2  --  1.3*  --   --   --   --   AMMONIA  --  30  --   --   --   --   --   --   --   --   --   BNP  --   --  122.9* 663.6*  --  167.3*  --  233.2* 83.3  --   --   MG  --   --  2.1 2.0  --  1.7  --  2.0 1.9 1.8 1.9  CALCIUM    < >  --  8.1* 7.8*  --  8.2*  --  7.8* 7.4* 7.4* 7.8*   < > = values in this interval not displayed.    --------------------------------------------------------------------------------------------------------------- Lab Results  Component Value Date   CHOL 301 (H) 09/10/2022   HDL 78 09/10/2022   LDLCALC 207 (H) 09/10/2022   TRIG 82 09/10/2022   CHOLHDL 3.9 09/10/2022    Lab Results  Component Value Date   HGBA1C 5.4 09/10/2022   No results for input(s): TSH, T4TOTAL, FREET4, T3FREE, THYROIDAB in the last 72 hours.  No results for input(s): VITAMINB12, FOLATE, FERRITIN, TIBC, IRON, RETICCTPCT in the last 72 hours.  ------------------------------------------------------------------------------------------------------------------ Cardiac  Enzymes No results for input(s): CKMB, TROPONINI, MYOGLOBIN in the last 168 hours.  Invalid input(s): CK  Micro Results No results found for this or any previous visit (from the past 240 hours).  Radiology Report DG Chest Port 1 View Result Date: 01/02/2024 CLINICAL DATA:  Shortness of breath, lethargy. EXAM: PORTABLE CHEST 1 VIEW COMPARISON:  12/30/2023 FINDINGS: Stable cardiomediastinal contours. No significant pleural effusion or interstitial edema.  Patchy opacities within the right mid and right lower lung compatible with atelectasis or airspace disease. The appearance is unchanged from 12/30/2023. Left lung is clear. IMPRESSION: Patchy opacities within the right mid and right lower lung compatible with atelectasis or airspace disease. Appearance is unchanged from 12/30/2023. Electronically Signed   By: Kimberley Penman M.D.   On: 01/02/2024 06:20      Signature  -   Lynnwood Sauer M.D on 01/02/2024 at 9:19 AM   -  To page go to [email or web address]

## 2024-01-03 DIAGNOSIS — R5383 Other fatigue: Secondary | ICD-10-CM | POA: Diagnosis not present

## 2024-01-03 LAB — CBC WITH DIFFERENTIAL/PLATELET
Abs Immature Granulocytes: 0.03 10*3/uL (ref 0.00–0.07)
Basophils Absolute: 0 10*3/uL (ref 0.0–0.1)
Basophils Relative: 1 %
Eosinophils Absolute: 0.1 10*3/uL (ref 0.0–0.5)
Eosinophils Relative: 2 %
HCT: 30.7 % — ABNORMAL LOW (ref 39.0–52.0)
Hemoglobin: 10.5 g/dL — ABNORMAL LOW (ref 13.0–17.0)
Immature Granulocytes: 1 %
Lymphocytes Relative: 19 %
Lymphs Abs: 1 10*3/uL (ref 0.7–4.0)
MCH: 34 pg (ref 26.0–34.0)
MCHC: 34.2 g/dL (ref 30.0–36.0)
MCV: 99.4 fL (ref 80.0–100.0)
Monocytes Absolute: 0.5 10*3/uL (ref 0.1–1.0)
Monocytes Relative: 10 %
Neutro Abs: 3.7 10*3/uL (ref 1.7–7.7)
Neutrophils Relative %: 67 %
Platelets: 197 10*3/uL (ref 150–400)
RBC: 3.09 MIL/uL — ABNORMAL LOW (ref 4.22–5.81)
RDW: 18.5 % — ABNORMAL HIGH (ref 11.5–15.5)
WBC: 5.4 10*3/uL (ref 4.0–10.5)
nRBC: 0 % (ref 0.0–0.2)

## 2024-01-03 LAB — MAGNESIUM: Magnesium: 1.8 mg/dL (ref 1.7–2.4)

## 2024-01-03 LAB — PHOSPHORUS: Phosphorus: 2.9 mg/dL (ref 2.5–4.6)

## 2024-01-03 LAB — COMPREHENSIVE METABOLIC PANEL WITH GFR
ALT: 44 U/L (ref 0–44)
AST: 63 U/L — ABNORMAL HIGH (ref 15–41)
Albumin: <1.5 g/dL — ABNORMAL LOW (ref 3.5–5.0)
Alkaline Phosphatase: 97 U/L (ref 38–126)
Anion gap: 5 (ref 5–15)
BUN: 8 mg/dL (ref 6–20)
CO2: 25 mmol/L (ref 22–32)
Calcium: 7.5 mg/dL — ABNORMAL LOW (ref 8.9–10.3)
Chloride: 103 mmol/L (ref 98–111)
Creatinine, Ser: 0.77 mg/dL (ref 0.61–1.24)
GFR, Estimated: >60 mL/min (ref >60)
Glucose, Bld: 84 mg/dL (ref 70–99)
Potassium: 3.3 mmol/L — ABNORMAL LOW (ref 3.5–5.1)
Sodium: 133 mmol/L — ABNORMAL LOW (ref 135–145)
Total Bilirubin: 0.7 mg/dL (ref 0.0–1.2)
Total Protein: 5.6 g/dL — ABNORMAL LOW (ref 6.5–8.1)

## 2024-01-03 MED ORDER — ALBUMIN HUMAN 25 % IV SOLN
50.0000 g | Freq: Once | INTRAVENOUS | Status: AC
  Administered 2024-01-03: 12.5 g via INTRAVENOUS
  Filled 2024-01-03: qty 200

## 2024-01-03 MED ORDER — FUROSEMIDE 10 MG/ML IJ SOLN
40.0000 mg | Freq: Once | INTRAMUSCULAR | Status: AC
  Administered 2024-01-03: 40 mg via INTRAVENOUS
  Filled 2024-01-03: qty 4

## 2024-01-03 MED ORDER — CHLORDIAZEPOXIDE HCL 5 MG PO CAPS
10.0000 mg | ORAL_CAPSULE | Freq: Two times a day (BID) | ORAL | Status: DC
  Administered 2024-01-03 – 2024-01-05 (×5): 10 mg via ORAL
  Filled 2024-01-03 (×5): qty 2

## 2024-01-03 MED ORDER — POTASSIUM CHLORIDE CRYS ER 20 MEQ PO TBCR
40.0000 meq | EXTENDED_RELEASE_TABLET | Freq: Two times a day (BID) | ORAL | Status: AC
  Administered 2024-01-03 (×2): 40 meq via ORAL
  Filled 2024-01-03 (×2): qty 2

## 2024-01-03 MED ORDER — PROSOURCE PLUS PO LIQD
30.0000 mL | Freq: Two times a day (BID) | ORAL | Status: DC
  Administered 2024-01-03 – 2024-01-07 (×9): 30 mL via ORAL
  Filled 2024-01-03 (×9): qty 30

## 2024-01-03 NOTE — Plan of Care (Signed)

## 2024-01-03 NOTE — Progress Notes (Signed)
 PROGRESS NOTE                                                                                                                                                                                                             Patient Demographics:    Jeffrey Hodges, is a 58 y.o. male, DOB - [birth year], FMW:[id number]  Outpatient Primary MD for the patient is [name], NP    LOS - 8  Admit date - 12/25/2023    Chief Complaint  Patient presents with   Alcohol Problem       Brief Narrative (HPI from H&P)   58 y.o. male with past medical history  of heart disease status post stent, heavy alcohol abuse, anemia, AKI, depression anxiety, elevated LFTs, hyperlipidemia, tobacco abuse, essential hypertension, was brought into the hospital due to less responsiveness and abnormal behavior.  Patient sees GI and cardiology.  It is being suspected that patient drinks 2 bottles of liquor essentially every day.  Patient was admitted with hepatic encephalopathy.    Subjective:   Patient in bed, appears comfortable, denies any headache, no fever, no chest pain or pressure, no shortness of breath , no abdominal pain. No focal weakness.   Assessment  & Plan :   Acute metabolic encephalopathy, suspicious for Wernicke's encephalopathy and now after 4 days going into DTs: Patient was admitted with ammonia levels of around 49 which is close to his baseline, ammonia levels have trended down, question if he is developing Wernicke's encephalopathy, placed on high-dose IV thiamine , CIWA protocol and Librium ,   Continue IV fluids, PT and speech eval and monitor.  Stable MRI brain, EEG, RPR TSH and B12 stable.  No headache or focal deficits, stable thiamine  levels however he had received few thiamine  doses prior to it.   Alcohol dependence: Consult to quit, CIWA protocol and Librium .   Dehydration, hypokalemia, hypophosphatemia: Continue hydration with IV  fluids, electrolytes replaced  Alcoholic hepatitis/elevated LFTs: Mild, continue to monitor, INR is relatively stable.   Chronic hyponatremia: Baseline low 130s.  Currently at baseline.  Secondary to alcoholism.   History of CAD status post stent, elevated troponin, it was mild and nonspecific: Stent placed in February 2024.  Troponin elevated but flat.  Patient with no ACS symptoms.  Likely demand ischemia.  Looks like he was on DAPT for a  year after cath.  Continue aspirin , echocardiogram is nonacute with preserved EF of 55% and no wall motion abnormality.   Anxiety/depression: Resuming Prozac  but holding BuSpar  for now.   Hyperlipidemia: Resume statin.  Looks like he is not taking at home.  Bladder outlet obstruction.  Foley, Flomax , ultrasound stable.  Some hematuria.  Stable bladder scan and renal ultrasound, Foley in place, bladder decompressed, likely due to catheter placement injury, continue hydration and monitor.  Also likely borderline UTI, 5 days of Rocephin .       Condition - Extremely Guarded  Family Communication  : Parents bedside on 12/27/2023, 12/29/2023, wife [name] over the  [phone]  12/27/2023, 12/28/2023, 12/30/2023, wife bedside 01/01/2024, multiple family members including parents bedside on 01/03/2024  Code Status : Full code  Consults  :  None  PUD Prophylaxis :  PPI   Procedures  :     TTE - 1. Left ventricular ejection fraction, by estimation, is 55 to 60%. The left ventricle has normal function. The left ventricle has no regional wall motion abnormalities. There is mild left ventricular hypertrophy. Left ventricular diastolic parameters were normal.  2. Right ventricular systolic function is normal. The right ventricular size is normal. Tricuspid regurgitation signal is inadequate for assessing PA pressure.  3. The mitral valve is grossly normal. Trivial mitral valve regurgitation. No evidence of mitral stenosis.  4. The aortic valve is grossly normal. Aortic  valve regurgitation is not visualized. No aortic stenosis is present.  5. The inferior vena cava is normal in size with <50% respiratory variability, suggesting right atrial pressure of 8 mmHg.  EEG - no active seizures.  MRI - 1. Motion degraded exam. 2. No acute intracranial abnormality. 3. Mild chronic microvascular ischemic disease for age.      Disposition Plan  :    Status is: Inpatient    DVT Prophylaxis  :  Heparin   Place TED hose Start: 01/02/24 1034 heparin  injection 5,000 Units Start: 12/27/23 1400 SCDs Start: 12/25/23 1909    Lab Results  Component Value Date   PLT 197 01/03/2024    Diet :  Diet Order             DIET SOFT Fluid consistency: Thin  Diet effective now                    Inpatient Medications  Scheduled Meds:  (feeding supplement) PROSource Plus  30 mL Oral BID BM   aspirin  EC  81 mg Oral Daily   chlordiazePOXIDE   15 mg Oral TID   Chlorhexidine  Gluconate Cloth  6 each Topical Daily   FLUoxetine   40 mg Oral Daily   folic acid   1 mg Oral Daily   furosemide   40 mg Intravenous Once   heparin  injection (subcutaneous)  5,000 Units Subcutaneous Q8H   lactulose   30 g Oral BID   multivitamin with minerals  1 tablet Oral Daily   nicotine   21 mg Transdermal Daily   pantoprazole   40 mg Oral Daily   potassium chloride   40 mEq Oral BID   tamsulosin   0.4 mg Oral Daily   Continuous Infusions:  albumin  human     cefTRIAXone  (ROCEPHIN )  IV Stopped (01/02/24 1219)   thiamine  (VITAMIN B1) injection Stopped (01/02/24 1013)   PRN Meds:.cloNIDine , hydrALAZINE , nitroGLYCERIN , sodium chloride  flush  Antibiotics  :    Anti-infectives (From admission, onward)    Start     Dose/Rate Route Frequency Ordered Stop   12/30/23 1115  cefTRIAXone  (  ROCEPHIN ) 1 g in sodium chloride  0.9 % 100 mL IVPB        1 g 200 mL/hr over 30 Minutes Intravenous Every 24 hours 12/30/23 1020 01/04/24 1114         Objective:   Vitals:   01/03/24 0400 01/03/24 0500  01/03/24 0614 01/03/24 0741  BP: 117/69  103/76 107/72  Pulse:    81  Resp: 14  14 14   Temp:    97.6 F (36.4 C)  TempSrc:    Axillary  SpO2:    100%  Weight:  91.5 kg    Height:        Wt Readings from Last 3 Encounters:  01/03/24 91.5 kg  10/20/23 90.3 kg  05/13/23 89.9 kg     Intake/Output Summary (Last 24 hours) at 01/03/2024 0911 Last data filed at 01/03/2024 0600 Gross per 24 hour  Intake 155 ml  Output 300 ml  Net -145 ml     Physical Exam  In bed, awake but mildly confused, no focal deficits moves all 4 extremities, Foley in place [place].AT,PERRAL Supple Neck, No JVD,   Symmetrical Chest wall movement, Good air movement bilaterally, CTAB RRR,No Gallops,Rubs or new Murmurs,  +ve B.Sounds, Abd Soft, No tenderness,   1+ edema       Data Review:    Recent Labs  Lab 12/30/23 0500 12/31/23 0538 01/01/24 0415 01/02/24 0744 01/03/24 0727  WBC 6.6 5.7 4.6 5.5 5.4  HGB 11.1* 10.7* 10.3* 11.4* 10.5*  HCT 32.4* 31.2* 30.3* 33.2* 30.7*  PLT 146* 124* 157 164 197  MCV 101.3* 99.7 100.3* 100.9* 99.4  MCH 34.7* 34.2* 34.1* 34.7* 34.0  MCHC 34.3 34.3 34.0 34.3 34.2  RDW 19.0* 18.9* 18.4* 18.6* 18.5*  LYMPHSABS 1.2 1.2 0.9 0.9 1.0  MONOABS 0.5 0.5 0.4 0.5 0.5  EOSABS 0.1 0.1 0.1 0.1 0.1  BASOSABS 0.0 0.0 0.0 0.0 0.0    Recent Labs  Lab 12/28/23 0749 12/28/23 1200 12/29/23 0533 12/29/23 0601 12/30/23 0500 12/31/23 0538 01/01/24 0415 01/02/24 0744 01/03/24 0727  NA 137  --  136  --  135 131* 130* 132* 133*  K 3.4*  --  3.2*  --  3.6 3.6 3.0* 3.7 3.3*  CL 98  --  98  --  101 98 99 100 103  CO2 26  --  25  --  28 26 25 23 25   ANIONGAP 13  --  13  --  6 7 6 9 5   GLUCOSE 117*  --  116*  --  110* 103* 96 79 84  BUN 10  --  11  --  10 10 8 8 8   CREATININE 0.59*  --  0.81  --  0.75 0.84 0.79 0.74 0.77  AST 79*  --  95*  --  75* 67* 65* 78* 63*  ALT 44  --  55*  --  47* 46* 44 52* 44  ALKPHOS 79  --  85  --  88 94 91 104 97  BILITOT 1.2  --  1.3*  --  1.1  1.2 1.0 1.0 0.7  ALBUMIN  1.5*  --  1.7*  --  1.5* <1.5* <1.5* <1.5* <1.5*  CRP 2.7*  --  2.9*  --  3.4* 3.0*  --   --   --   PROCALCITON 0.48  --  0.26  --  0.23 0.26  --   --   --   INR  --  1.2  --  1.3*  --   --   --   --   --  BNP 663.6*  --  167.3*  --  233.2* 83.3  --   --   --   MG 2.0  --  1.7  --  2.0 1.9 1.8 1.9 1.8  PHOS 3.0  --  2.3*  --  3.7 3.6 3.0 2.6 2.9  CALCIUM  7.8*  --  8.2*  --  7.8* 7.4* 7.4* 7.8* 7.5*      Recent Labs  Lab 12/28/23 0749 12/28/23 1200 12/29/23 0533 12/29/23 0601 12/30/23 0500 12/31/23 0538 01/01/24 0415 01/02/24 0744 01/03/24 0727  CRP 2.7*  --  2.9*  --  3.4* 3.0*  --   --   --   PROCALCITON 0.48  --  0.26  --  0.23 0.26  --   --   --   INR  --  1.2  --  1.3*  --   --   --   --   --   BNP 663.6*  --  167.3*  --  233.2* 83.3  --   --   --   MG 2.0  --  1.7  --  2.0 1.9 1.8 1.9 1.8  CALCIUM  7.8*  --  8.2*  --  7.8* 7.4* 7.4* 7.8* 7.5*    --------------------------------------------------------------------------------------------------------------- Lab Results  Component Value Date   CHOL 301 (H) 09/10/2022   HDL 78 09/10/2022   LDLCALC 207 (H) 09/10/2022   TRIG 82 09/10/2022   CHOLHDL 3.9 09/10/2022    Lab Results  Component Value Date   HGBA1C 5.4 09/10/2022   No results for input(s): TSH, T4TOTAL, FREET4, T3FREE, THYROIDAB in the last 72 hours.  No results for input(s): VITAMINB12, FOLATE, FERRITIN, TIBC, IRON, RETICCTPCT in the last 72 hours.  ------------------------------------------------------------------------------------------------------------------ Cardiac Enzymes No results for input(s): CKMB, TROPONINI, MYOGLOBIN in the last 168 hours.  Invalid input(s): CK  Micro Results No results found for this or any previous visit (from the past 240 hours).  Radiology Report DG Chest Port 1 View Result Date: 01/02/2024 CLINICAL DATA:  Shortness of breath, lethargy. EXAM: PORTABLE CHEST 1  VIEW COMPARISON:  12/30/2023 FINDINGS: Stable cardiomediastinal contours. No significant pleural effusion or interstitial edema. Patchy opacities within the right mid and right lower lung compatible with atelectasis or airspace disease. The appearance is unchanged from 12/30/2023. Left lung is clear. IMPRESSION: Patchy opacities within the right mid and right lower lung compatible with atelectasis or airspace disease. Appearance is unchanged from 12/30/2023. Electronically Signed   By: Waddell Calk M.D.   On: 01/02/2024 06:20      Signature  -   Lavada Stank M.D on 01/03/2024 at 9:11 AM   -  To page go to [email or web address]

## 2024-01-03 NOTE — Progress Notes (Signed)
 OT Cancellation Note  Patient Details Name: IZEAH VOSSLER MRN: [id number] DOB: Jul 12, 1966   Cancelled Treatment:    Reason Eval/Treat Not Completed: (P) Patient declined, no reason specified, Assisted Pt to bed pan, Pt confused and not making sense, difficulty word finding and at times speaking in tangents. RN notified. Will continue to follow acutely.   Elouise JONELLE Bott 01/03/2024, 5:59 PM

## 2024-01-04 DIAGNOSIS — R5383 Other fatigue: Secondary | ICD-10-CM | POA: Diagnosis not present

## 2024-01-04 LAB — CBC WITH DIFFERENTIAL/PLATELET
Abs Immature Granulocytes: 0.03 10*3/uL (ref 0.00–0.07)
Basophils Absolute: 0.1 10*3/uL (ref 0.0–0.1)
Basophils Relative: 1 %
Eosinophils Absolute: 0.1 10*3/uL (ref 0.0–0.5)
Eosinophils Relative: 2 %
HCT: 30.2 % — ABNORMAL LOW (ref 39.0–52.0)
Hemoglobin: 10.3 g/dL — ABNORMAL LOW (ref 13.0–17.0)
Immature Granulocytes: 1 %
Lymphocytes Relative: 20 %
Lymphs Abs: 1.1 10*3/uL (ref 0.7–4.0)
MCH: 34 pg (ref 26.0–34.0)
MCHC: 34.1 g/dL (ref 30.0–36.0)
MCV: 99.7 fL (ref 80.0–100.0)
Monocytes Absolute: 0.5 10*3/uL (ref 0.1–1.0)
Monocytes Relative: 10 %
Neutro Abs: 3.8 10*3/uL (ref 1.7–7.7)
Neutrophils Relative %: 66 %
Platelets: 220 10*3/uL (ref 150–400)
RBC: 3.03 MIL/uL — ABNORMAL LOW (ref 4.22–5.81)
RDW: 18.2 % — ABNORMAL HIGH (ref 11.5–15.5)
WBC: 5.6 10*3/uL (ref 4.0–10.5)
nRBC: 0 % (ref 0.0–0.2)

## 2024-01-04 LAB — COMPREHENSIVE METABOLIC PANEL WITH GFR
ALT: 40 U/L (ref 0–44)
AST: 58 U/L — ABNORMAL HIGH (ref 15–41)
Albumin: 1.9 g/dL — ABNORMAL LOW (ref 3.5–5.0)
Alkaline Phosphatase: 94 U/L (ref 38–126)
Anion gap: 8 (ref 5–15)
BUN: 7 mg/dL (ref 6–20)
CO2: 23 mmol/L (ref 22–32)
Calcium: 7.8 mg/dL — ABNORMAL LOW (ref 8.9–10.3)
Chloride: 103 mmol/L (ref 98–111)
Creatinine, Ser: 0.76 mg/dL (ref 0.61–1.24)
GFR, Estimated: >60 mL/min (ref >60)
Glucose, Bld: 122 mg/dL — ABNORMAL HIGH (ref 70–99)
Potassium: 3.2 mmol/L — ABNORMAL LOW (ref 3.5–5.1)
Sodium: 134 mmol/L — ABNORMAL LOW (ref 135–145)
Total Bilirubin: 0.8 mg/dL (ref 0.0–1.2)
Total Protein: 5.8 g/dL — ABNORMAL LOW (ref 6.5–8.1)

## 2024-01-04 LAB — GLUCOSE, CAPILLARY: Glucose-Capillary: 171 mg/dL — ABNORMAL HIGH (ref 70–99)

## 2024-01-04 LAB — MAGNESIUM: Magnesium: 1.7 mg/dL (ref 1.7–2.4)

## 2024-01-04 LAB — PHOSPHORUS: Phosphorus: 2.6 mg/dL (ref 2.5–4.6)

## 2024-01-04 MED ORDER — MAGNESIUM SULFATE 2 GM/50ML IV SOLN
2.0000 g | Freq: Once | INTRAVENOUS | Status: AC
  Administered 2024-01-04: 2 g via INTRAVENOUS
  Filled 2024-01-04: qty 50

## 2024-01-04 MED ORDER — POTASSIUM CHLORIDE CRYS ER 20 MEQ PO TBCR
40.0000 meq | EXTENDED_RELEASE_TABLET | Freq: Two times a day (BID) | ORAL | Status: AC
  Administered 2024-01-04 (×2): 40 meq via ORAL
  Filled 2024-01-04 (×2): qty 2

## 2024-01-04 NOTE — Progress Notes (Signed)
 PROGRESS NOTE                                                                                                                                                                                                             Patient Demographics:    Jeffrey Hodges, is a 58 y.o. male, DOB - 1966/07/01, FMW:[id number]  Outpatient Primary MD for the patient is [name], NP    LOS - 9  Admit date - 12/25/2023    Chief Complaint  Patient presents with   Alcohol Problem       Brief Narrative (HPI from H&P)   58 y.o. male with past medical history  of heart disease status post stent, heavy alcohol abuse, anemia, AKI, depression anxiety, elevated LFTs, hyperlipidemia, tobacco abuse, essential hypertension, was brought into the hospital due to less responsiveness and abnormal behavior.  Patient sees GI and cardiology.  It is being suspected that patient drinks 2 bottles of liquor essentially every day.  Patient was admitted with hepatic encephalopathy.    Subjective:   Patient in bed, appears comfortable, denies any headache, no fever, no chest pain or pressure, no shortness of breath , no abdominal pain. No new focal weakness.   Assessment  & Plan :   Acute metabolic encephalopathy, suspicious for Wernicke's encephalopathy and now after 4 days going into DTs: Patient was admitted with ammonia levels of around 49 which is close to his baseline, ammonia levels have trended down, question if he is developing Wernicke's encephalopathy, placed on high-dose IV thiamine , CIWA protocol and Librium ,   Continue IV fluids, PT and speech eval and monitor.  Stable MRI brain, EEG, RPR TSH and B12 stable.  No headache or focal deficits, stable thiamine  levels however he had received few thiamine  doses prior to it.   Alcohol dependence: Consult to quit, CIWA protocol and Librium .   Dehydration, hypokalemia, hypophosphatemia: Continue hydration with IV  fluids, electrolytes replaced  Alcoholic hepatitis/elevated LFTs: Mild, continue to monitor, INR is relatively stable.   Chronic hyponatremia: Baseline low 130s.  Currently at baseline.  Secondary to alcoholism.   History of CAD status post stent, elevated troponin, it was mild and nonspecific: Stent placed in February 2024.  Troponin elevated but flat.  Patient with no ACS symptoms.  Likely demand ischemia.  Looks like he was on DAPT for  a year after cath.  Continue aspirin , echocardiogram is nonacute with preserved EF of 55% and no wall motion abnormality.   Anxiety/depression: Resuming Prozac  but holding BuSpar  for now.   Hyperlipidemia: Resume statin.  Looks like he is not taking at home.  Bladder outlet obstruction.  Foley, Flomax , ultrasound stable.  Some hematuria.  Stable bladder scan and renal ultrasound, Foley in place, bladder decompressed, likely due to catheter placement injury, continue hydration and monitor.  Also likely borderline UTI, 5 days of Rocephin .       Condition - Extremely Guarded  Family Communication  : Parents bedside on 12/27/2023, 12/29/2023, wife [name] over the  [phone]  12/27/2023, 12/28/2023, 12/30/2023, wife bedside 01/01/2024, multiple family members including parents bedside on 01/03/2024  Code Status : Full code  Consults  :  None  PUD Prophylaxis :  PPI   Procedures  :     TTE - 1. Left ventricular ejection fraction, by estimation, is 55 to 60%. The left ventricle has normal function. The left ventricle has no regional wall motion abnormalities. There is mild left ventricular hypertrophy. Left ventricular diastolic parameters were normal.  2. Right ventricular systolic function is normal. The right ventricular size is normal. Tricuspid regurgitation signal is inadequate for assessing PA pressure.  3. The mitral valve is grossly normal. Trivial mitral valve regurgitation. No evidence of mitral stenosis.  4. The aortic valve is grossly normal. Aortic  valve regurgitation is not visualized. No aortic stenosis is present.  5. The inferior vena cava is normal in size with <50% respiratory variability, suggesting right atrial pressure of 8 mmHg.  EEG - no active seizures.  MRI - 1. Motion degraded exam. 2. No acute intracranial abnormality. 3. Mild chronic microvascular ischemic disease for age.      Disposition Plan  :    Status is: Inpatient    DVT Prophylaxis  :  Heparin   Place TED hose Start: 01/02/24 1034 heparin  injection 5,000 Units Start: 12/27/23 1400 SCDs Start: 12/25/23 1909    Lab Results  Component Value Date   PLT 220 01/04/2024    Diet :  Diet Order             DIET SOFT Fluid consistency: Thin  Diet effective now                    Inpatient Medications  Scheduled Meds:  (feeding supplement) PROSource Plus  30 mL Oral BID BM   aspirin  EC  81 mg Oral Daily   chlordiazePOXIDE   10 mg Oral BID   Chlorhexidine  Gluconate Cloth  6 each Topical Daily   FLUoxetine   40 mg Oral Daily   folic acid   1 mg Oral Daily   heparin  injection (subcutaneous)  5,000 Units Subcutaneous Q8H   lactulose   30 g Oral BID   multivitamin with minerals  1 tablet Oral Daily   nicotine   21 mg Transdermal Daily   pantoprazole   40 mg Oral Daily   potassium chloride   40 mEq Oral BID   tamsulosin   0.4 mg Oral Daily   Continuous Infusions:  magnesium  sulfate bolus IVPB     thiamine  (VITAMIN B1) injection Stopped (01/03/24 1229)   PRN Meds:.cloNIDine , hydrALAZINE , nitroGLYCERIN , sodium chloride  flush  Antibiotics  :    Anti-infectives (From admission, onward)    Start     Dose/Rate Route Frequency Ordered Stop   12/30/23 1115  cefTRIAXone  (ROCEPHIN ) 1 g in sodium chloride  0.9 % 100 mL IVPB  1 g 200 mL/hr over 30 Minutes Intravenous Every 24 hours 12/30/23 1020 01/03/24 1133         Objective:   Vitals:   01/04/24 0000 01/04/24 0400 01/04/24 0503 01/04/24 0745  BP: 133/79 100/70  110/83  Pulse:  86  (!) 107   Resp:  19  18  Temp:  97.7 F (36.5 C)  97.9 F (36.6 C)  TempSrc:  Oral  Oral  SpO2:    100%  Weight:   90.7 kg   Height:        Wt Readings from Last 3 Encounters:  01/04/24 90.7 kg  10/20/23 90.3 kg  05/13/23 89.9 kg     Intake/Output Summary (Last 24 hours) at 01/04/2024 0827 Last data filed at 01/04/2024 0602 Gross per 24 hour  Intake 296.48 ml  Output 1450 ml  Net -1153.52 ml     Physical Exam  In bed, awake but minimally confused, no focal deficits moves all 4 extremities, Foley in place [place].AT,PERRAL Supple Neck, No JVD,   Symmetrical Chest wall movement, Good air movement bilaterally, CTAB RRR,No Gallops,Rubs or new Murmurs,  +ve B.Sounds, Abd Soft, No tenderness,   1+ edema       Data Review:    Recent Labs  Lab 12/31/23 0538 01/01/24 0415 01/02/24 0744 01/03/24 0727 01/04/24 0649  WBC 5.7 4.6 5.5 5.4 5.6  HGB 10.7* 10.3* 11.4* 10.5* 10.3*  HCT 31.2* 30.3* 33.2* 30.7* 30.2*  PLT 124* 157 164 197 220  MCV 99.7 100.3* 100.9* 99.4 99.7  MCH 34.2* 34.1* 34.7* 34.0 34.0  MCHC 34.3 34.0 34.3 34.2 34.1  RDW 18.9* 18.4* 18.6* 18.5* 18.2*  LYMPHSABS 1.2 0.9 0.9 1.0 1.1  MONOABS 0.5 0.4 0.5 0.5 0.5  EOSABS 0.1 0.1 0.1 0.1 0.1  BASOSABS 0.0 0.0 0.0 0.0 0.1    Recent Labs  Lab 12/28/23 1200 12/29/23 0533 12/29/23 0533 12/29/23 0601 12/30/23 0500 12/31/23 0538 01/01/24 0415 01/02/24 0744 01/03/24 0727 01/04/24 0649  NA  --  136   < >  --  135 131* 130* 132* 133* 134*  K  --  3.2*   < >  --  3.6 3.6 3.0* 3.7 3.3* 3.2*  CL  --  98   < >  --  101 98 99 100 103 103  CO2  --  25   < >  --  28 26 25 23 25 23   ANIONGAP  --  13   < >  --  6 7 6 9 5 8   GLUCOSE  --  116*   < >  --  110* 103* 96 79 84 122*  BUN  --  11   < >  --  10 10 8 8 8 7   CREATININE  --  0.81   < >  --  0.75 0.84 0.79 0.74 0.77 0.76  AST  --  95*   < >  --  75* 67* 65* 78* 63* 58*  ALT  --  55*   < >  --  47* 46* 44 52* 44 40  ALKPHOS  --  85   < >  --  88 94 91 104 97 94   BILITOT  --  1.3*   < >  --  1.1 1.2 1.0 1.0 0.7 0.8  ALBUMIN   --  1.7*   < >  --  1.5* <1.5* <1.5* <1.5* <1.5* 1.9*  CRP  --  2.9*  --   --  3.4* 3.0*  --   --   --   --   PROCALCITON  --  0.26  --   --  0.23 0.26  --   --   --   --   INR 1.2  --   --  1.3*  --   --   --   --   --   --   BNP  --  167.3*  --   --  233.2* 83.3  --   --   --   --   MG  --  1.7   < >  --  2.0 1.9 1.8 1.9 1.8 1.7  PHOS  --  2.3*   < >  --  3.7 3.6 3.0 2.6 2.9 2.6  CALCIUM   --  8.2*   < >  --  7.8* 7.4* 7.4* 7.8* 7.5* 7.8*   < > = values in this interval not displayed.      Recent Labs  Lab 12/28/23 1200 12/29/23 0533 12/29/23 0533 12/29/23 0601 12/30/23 0500 12/31/23 0538 01/01/24 0415 01/02/24 0744 01/03/24 0727 01/04/24 0649  CRP  --  2.9*  --   --  3.4* 3.0*  --   --   --   --   PROCALCITON  --  0.26  --   --  0.23 0.26  --   --   --   --   INR 1.2  --   --  1.3*  --   --   --   --   --   --   BNP  --  167.3*  --   --  233.2* 83.3  --   --   --   --   MG  --  1.7   < >  --  2.0 1.9 1.8 1.9 1.8 1.7  CALCIUM   --  8.2*   < >  --  7.8* 7.4* 7.4* 7.8* 7.5* 7.8*   < > = values in this interval not displayed.    --------------------------------------------------------------------------------------------------------------- Lab Results  Component Value Date   CHOL 301 (H) 09/10/2022   HDL 78 09/10/2022   LDLCALC 207 (H) 09/10/2022   TRIG 82 09/10/2022   CHOLHDL 3.9 09/10/2022    Lab Results  Component Value Date   HGBA1C 5.4 09/10/2022   No results for input(s): TSH, T4TOTAL, FREET4, T3FREE, THYROIDAB in the last 72 hours.  No results for input(s): VITAMINB12, FOLATE, FERRITIN, TIBC, IRON, RETICCTPCT in the last 72 hours.  ------------------------------------------------------------------------------------------------------------------ Cardiac Enzymes No results for input(s): CKMB, TROPONINI, MYOGLOBIN in the last 168 hours.  Invalid input(s): CK  Micro  Results No results found for this or any previous visit (from the past 240 hours).  Radiology Report No results found.     Signature  -   Lavada Stank M.D on 01/04/2024 at 8:27 AM   -  To page go to [email or web address]

## 2024-01-04 NOTE — Plan of Care (Signed)

## 2024-01-04 NOTE — Plan of Care (Signed)
 Patient alert and oriented with resp even and unlabored.  Patient sitting in recliner chair with visitor present at bedside. Breakfast tray arrived to room. Call light within reach.  Chair alarm set. Patient verbalizes understanding to call prior to attempting to get out of chair.  Problem: Education: Goal: Knowledge of General Education information will improve Description: Including pain rating scale, medication(s)/side effects and non-pharmacologic comfort measures Outcome: Progressing   Problem: Health Behavior/Discharge Planning: Goal: Ability to manage health-related needs will improve Outcome: Progressing   Problem: Clinical Measurements: Goal: Ability to maintain clinical measurements within normal limits will improve Outcome: Progressing Goal: Will remain free from infection Outcome: Progressing Goal: Diagnostic test results will improve Outcome: Progressing Goal: Cardiovascular complication will be avoided Outcome: Progressing   Problem: Activity: Goal: Risk for activity intolerance will decrease Outcome: Progressing   Problem: Nutrition: Goal: Adequate nutrition will be maintained Outcome: Progressing   Problem: Coping: Goal: Level of anxiety will decrease Outcome: Progressing   Problem: Elimination: Goal: Will not experience complications related to bowel motility Outcome: Progressing Goal: Will not experience complications related to urinary retention Outcome: Progressing   Problem: Pain Managment: Goal: General experience of comfort will improve and/or be controlled Outcome: Progressing   Problem: Safety: Goal: Ability to remain free from injury will improve Outcome: Progressing   Problem: Skin Integrity: Goal: Risk for impaired skin integrity will decrease Outcome: Progressing   Problem: Safety: Goal: Non-violent Restraint(s) Outcome: Progressing

## 2024-01-05 DIAGNOSIS — R5383 Other fatigue: Secondary | ICD-10-CM | POA: Diagnosis not present

## 2024-01-05 LAB — CBC WITH DIFFERENTIAL/PLATELET
Abs Immature Granulocytes: 0.03 10*3/uL (ref 0.00–0.07)
Basophils Absolute: 0.1 10*3/uL (ref 0.0–0.1)
Basophils Relative: 1 %
Eosinophils Absolute: 0.1 10*3/uL (ref 0.0–0.5)
Eosinophils Relative: 2 %
HCT: 32.2 % — ABNORMAL LOW (ref 39.0–52.0)
Hemoglobin: 10.8 g/dL — ABNORMAL LOW (ref 13.0–17.0)
Immature Granulocytes: 0 %
Lymphocytes Relative: 18 %
Lymphs Abs: 1.4 10*3/uL (ref 0.7–4.0)
MCH: 33.9 pg (ref 26.0–34.0)
MCHC: 33.5 g/dL (ref 30.0–36.0)
MCV: 100.9 fL — ABNORMAL HIGH (ref 80.0–100.0)
Monocytes Absolute: 0.8 10*3/uL (ref 0.1–1.0)
Monocytes Relative: 11 %
Neutro Abs: 5.1 10*3/uL (ref 1.7–7.7)
Neutrophils Relative %: 68 %
Platelets: 231 10*3/uL (ref 150–400)
RBC: 3.19 MIL/uL — ABNORMAL LOW (ref 4.22–5.81)
RDW: 18.1 % — ABNORMAL HIGH (ref 11.5–15.5)
WBC: 7.4 10*3/uL (ref 4.0–10.5)
nRBC: 0 % (ref 0.0–0.2)

## 2024-01-05 LAB — COMPREHENSIVE METABOLIC PANEL WITH GFR
ALT: 39 U/L (ref 0–44)
AST: 56 U/L — ABNORMAL HIGH (ref 15–41)
Albumin: 1.8 g/dL — ABNORMAL LOW (ref 3.5–5.0)
Alkaline Phosphatase: 94 U/L (ref 38–126)
Anion gap: 6 (ref 5–15)
BUN: 5 mg/dL — ABNORMAL LOW (ref 6–20)
CO2: 22 mmol/L (ref 22–32)
Calcium: 7.9 mg/dL — ABNORMAL LOW (ref 8.9–10.3)
Chloride: 104 mmol/L (ref 98–111)
Creatinine, Ser: 0.74 mg/dL (ref 0.61–1.24)
GFR, Estimated: >60 mL/min (ref >60)
Glucose, Bld: 93 mg/dL (ref 70–99)
Potassium: 3.4 mmol/L — ABNORMAL LOW (ref 3.5–5.1)
Sodium: 132 mmol/L — ABNORMAL LOW (ref 135–145)
Total Bilirubin: 0.7 mg/dL (ref 0.0–1.2)
Total Protein: 5.9 g/dL — ABNORMAL LOW (ref 6.5–8.1)

## 2024-01-05 MED ORDER — MAGNESIUM SULFATE IN D5W 1-5 GM/100ML-% IV SOLN
1.0000 g | Freq: Once | INTRAVENOUS | Status: AC
  Administered 2024-01-05: 1 g via INTRAVENOUS
  Filled 2024-01-05: qty 100

## 2024-01-05 MED ORDER — LIDOCAINE 5 % EX PTCH
2.0000 | MEDICATED_PATCH | CUTANEOUS | Status: DC
  Administered 2024-01-05 – 2024-01-06 (×2): 2 via TRANSDERMAL
  Filled 2024-01-05 (×2): qty 2

## 2024-01-05 MED ORDER — METHOCARBAMOL 500 MG PO TABS: 500.0000 mg | ORAL_TABLET | Freq: Three times a day (TID) | ORAL | Status: DC | PRN

## 2024-01-05 MED ORDER — POTASSIUM CHLORIDE CRYS ER 20 MEQ PO TBCR
40.0000 meq | EXTENDED_RELEASE_TABLET | Freq: Two times a day (BID) | ORAL | Status: AC
  Administered 2024-01-05 (×2): 40 meq via ORAL
  Filled 2024-01-05 (×2): qty 2

## 2024-01-05 MED ORDER — ACETAMINOPHEN 500 MG PO TABS: 500.0000 mg | ORAL_TABLET | Freq: Four times a day (QID) | ORAL | Status: DC | PRN

## 2024-01-05 NOTE — Progress Notes (Signed)
 Patient has ordered for dc foley catheter but refused stating that he does not want to experienced same problem as before. Will try again at a later time

## 2024-01-05 NOTE — Progress Notes (Signed)
 Occupational Therapy Treatment Patient Details Name: Jeffrey Hodges MRN: [id number] DOB: Sep 04, 1965 Today's Date: 01/05/2024   History of present illness 58 y.o. male brought into the hospital 6/12 due to less responsiveness and abnormal behavior. Patient was admitted with hepatic encephalopathy. PMH: heart disease status post stent, heavy alcohol abuse, anemia, AKI, depression anxiety, elevated LFTs, hyperlipidemia, tobacco abuse, essential hypertension.   OT comments  Pt received in supine on bed pan. Reports having frequent, uncontrolled, loose BMs. Rolled using rail off of bed pan without physical assist. Total assist for pericare, min assist to change soiled gown. Pt completed 3 grooming activities with min to set up in bed with HOB up. Pt declined OOB to chair for fear of having another bowel movement and not being able to get onto bed pan. Patient will benefit from continued inpatient follow up therapy, <3 hours/day.      If plan is discharge home, recommend the following:  A lot of help with walking and/or transfers;A lot of help with bathing/dressing/bathroom;Assistance with cooking/housework;Direct supervision/assist for medications management;Direct supervision/assist for financial management;Assist for transportation;Help with stairs or ramp for entrance   Equipment Recommendations  BSC/3in1    Recommendations for Other Services      Precautions / Restrictions Precautions Precautions: Fall Restrictions Weight Bearing Restrictions Per Provider Order: No       Mobility Bed Mobility Overal bed mobility: Needs Assistance Bed Mobility: Rolling Rolling: Modified independent (Device/Increase time)              Transfers                         Balance                                           ADL either performed or assessed with clinical judgement   ADL       Grooming: Wash/dry hands;Wash/dry face;Oral care;Set up;Bed level                        Toileting- Clothing Manipulation and Hygiene: Total assistance;Bed level              Extremity/Trunk Assessment              Vision       Perception     Praxis     Communication Communication Communication: No apparent difficulties   Cognition Arousal: Alert Behavior During Therapy: Flat affect Cognition: Cognition impaired   Orientation impairments: Time Awareness: Intellectual awareness impaired, Online awareness impaired Memory impairment (select all impairments): Short-term memory, Working [employer] fast streamer, Conservation officer, historic buildings Attention impairment (select first level of impairment): Sustained attention Executive functioning impairment (select all impairments): Problem solving, Sequencing, Reasoning                   Following commands: Impaired Following commands impaired: Follows one step commands inconsistently, Follows one step commands with increased time      Cueing   Cueing Techniques: Verbal cues  Exercises      Shoulder Instructions       General Comments      Pertinent Vitals/ Pain       Pain Assessment Pain Assessment: No/denies pain  Home Living  Prior Functioning/Environment              Frequency  Min 2X/week        Progress Toward Goals  OT Goals(current goals can now be found in the care plan section)  Progress towards OT goals: Not progressing toward goals - comment  Acute Rehab OT Goals OT Goal Formulation: With patient Time For Goal Achievement: 01/13/24 Potential to Achieve Goals: Good  Plan      Co-evaluation                 AM-PAC OT 6 Clicks Daily Activity     Outcome Measure   Help from another person eating meals?: A Little Help from another person taking care of personal grooming?: A Little Help from another person toileting, which includes using toliet, bedpan, or urinal?: Total Help from another  person bathing (including washing, rinsing, drying)?: A Lot Help from another person to put on and taking off regular upper body clothing?: A Little Help from another person to put on and taking off regular lower body clothing?: A Lot 6 Click Score: 14    End of Session    OT Visit Diagnosis: Unsteadiness on feet (R26.81);Other abnormalities of gait and mobility (R26.89);Other symptoms and signs involving cognitive function;Muscle weakness (generalized) (M62.81);History of falling (Z91.81);Pain   Activity Tolerance Patient limited by fatigue;Other (comment) (self limiting)   Patient Left in bed;with call bell/phone within reach;with bed alarm set;with restraints reapplied   Nurse Communication          Time: 8843-8771 OT Time Calculation (min): 32 min  Charges: OT General Charges $OT Visit: 1 Visit OT Treatments $Self Care/Home Management : 23-37 mins  Mliss HERO, OTR/L Acute Rehabilitation Services Office: [phone]  Jeffrey Hodges 01/05/2024, 12:47 PM

## 2024-01-05 NOTE — Plan of Care (Signed)

## 2024-01-05 NOTE — Progress Notes (Signed)
 RN explained and educated patient about catheter use, Patient declined to remove catheter, MD made aware, will try again at a later time.

## 2024-01-05 NOTE — Progress Notes (Signed)
 PROGRESS NOTE                                                                                                                                                                                                             Patient Demographics:    Jeffrey Hodges, is a 58 y.o. male, DOB - 1966-03-21, FMW:[id number]  Outpatient Primary MD for the patient is [name], NP    LOS - 10  Admit date - 12/25/2023    Chief Complaint  Patient presents with   Alcohol Problem       Brief Narrative (HPI from H&P)   58 y.o. male with past medical history  of heart disease status post stent, heavy alcohol abuse, anemia, AKI, depression anxiety, elevated LFTs, hyperlipidemia, tobacco abuse, essential hypertension, was brought into the hospital due to less responsiveness and abnormal behavior.  Patient sees GI and cardiology.  It is being suspected that patient drinks 2 bottles of liquor essentially every day.  Patient was admitted with hepatic encephalopathy.    Subjective:   Patient in bed, appears comfortable, denies any headache, no fever, no chest pain or pressure, no shortness of breath , no abdominal pain. No focal weakness.   Assessment  & Plan :   Acute metabolic encephalopathy, suspicious for Wernicke's encephalopathy and now after 4 days going into DTs: Patient was admitted with ammonia levels of around 49 which is close to his baseline, ammonia levels have trended down, question if he is developing Wernicke's encephalopathy, placed on high-dose IV thiamine , CIWA protocol and Librium ,   Continue IV fluids, PT and speech eval and monitor.  Stable MRI brain, EEG, RPR TSH and B12 stable.  No headache or focal deficits, stable thiamine  levels however he had received few thiamine  doses prior to it.   Alcohol dependence: Consult to quit, CIWA protocol and Librium .   Dehydration, hypokalemia, hypophosphatemia, hypomagnesemia: Continue  hydration with IV fluids, electrolytes replaced  Alcoholic hepatitis/elevated LFTs: Mild, continue to monitor, INR is relatively stable.   Chronic hyponatremia: Baseline low 130s.  Currently at baseline.  Secondary to alcoholism.   History of CAD status post stent, elevated troponin, it was mild and nonspecific: Stent placed in February 2024.  Troponin elevated but flat.  Patient with no ACS symptoms.  Likely demand ischemia.  Looks like he was on DAPT for  a year after cath.  Continue aspirin , echocardiogram is nonacute with preserved EF of 55% and no wall motion abnormality.   Anxiety/depression: Resuming Prozac  but holding BuSpar  for now.   Hyperlipidemia: Resume statin.  Looks like he is not taking at home.  Bladder outlet obstruction.  Foley, Flomax , ultrasound stable.  Challenge Foley removal on 01/05/2024 and monitor bladder scans.  Some hematuria.  Stable bladder scan and renal ultrasound, Foley in place, bladder decompressed, likely due to catheter placement injury, continue hydration and monitor.  Also likely borderline UTI, 5 days of Rocephin .       Condition - Extremely Guarded  Family Communication  : Parents bedside on 12/27/2023, 12/29/2023, wife [name] over the  [phone]  12/27/2023, 12/28/2023, 12/30/2023, wife bedside 01/01/2024, multiple family members including parents bedside on 01/03/2024  Code Status : Full code  Consults  :  None  PUD Prophylaxis :  PPI   Procedures  :     TTE - 1. Left ventricular ejection fraction, by estimation, is 55 to 60%. The left ventricle has normal function. The left ventricle has no regional wall motion abnormalities. There is mild left ventricular hypertrophy. Left ventricular diastolic parameters were normal.  2. Right ventricular systolic function is normal. The right ventricular size is normal. Tricuspid regurgitation signal is inadequate for assessing PA pressure.  3. The mitral valve is grossly normal. Trivial mitral valve  regurgitation. No evidence of mitral stenosis.  4. The aortic valve is grossly normal. Aortic valve regurgitation is not visualized. No aortic stenosis is present.  5. The inferior vena cava is normal in size with <50% respiratory variability, suggesting right atrial pressure of 8 mmHg.  EEG - no active seizures.  MRI - 1. Motion degraded exam. 2. No acute intracranial abnormality. 3. Mild chronic microvascular ischemic disease for age.      Disposition Plan  :    Status is: Inpatient    DVT Prophylaxis  :  Heparin   Place TED hose Start: 01/02/24 1034 heparin  injection 5,000 Units Start: 12/27/23 1400 SCDs Start: 12/25/23 1909    Lab Results  Component Value Date   PLT 231 01/05/2024    Diet :  Diet Order             DIET SOFT Fluid consistency: Thin  Diet effective now                    Inpatient Medications  Scheduled Meds:  (feeding supplement) PROSource Plus  30 mL Oral BID BM   aspirin  EC  81 mg Oral Daily   chlordiazePOXIDE   10 mg Oral BID   Chlorhexidine  Gluconate Cloth  6 each Topical Daily   FLUoxetine   40 mg Oral Daily   folic acid   1 mg Oral Daily   heparin  injection (subcutaneous)  5,000 Units Subcutaneous Q8H   lactulose   30 g Oral BID   multivitamin with minerals  1 tablet Oral Daily   nicotine   21 mg Transdermal Daily   pantoprazole   40 mg Oral Daily   potassium chloride   40 mEq Oral BID   tamsulosin   0.4 mg Oral Daily   Continuous Infusions:  magnesium  sulfate bolus IVPB     thiamine  (VITAMIN B1) injection 500 mg (01/04/24 1047)   PRN Meds:.cloNIDine , hydrALAZINE , nitroGLYCERIN , sodium chloride  flush  Antibiotics  :    Anti-infectives (From admission, onward)    Start     Dose/Rate Route Frequency Ordered Stop   12/30/23 1115  cefTRIAXone  (ROCEPHIN ) 1 g  in sodium chloride  0.9 % 100 mL IVPB        1 g 200 mL/hr over 30 Minutes Intravenous Every 24 hours 12/30/23 1020 01/03/24 1133         Objective:   Vitals:   01/04/24  2356 01/05/24 0000 01/05/24 0335 01/05/24 0544  BP: 116/77 110/79 104/69   Pulse:  91 98   Resp:   18   Temp: 98.7 F (37.1 C)  99.3 F (37.4 C)   TempSrc: Oral  Oral   SpO2:   96%   Weight:    90.1 kg  Height:        Wt Readings from Last 3 Encounters:  01/05/24 90.1 kg  10/20/23 90.3 kg  05/13/23 89.9 kg     Intake/Output Summary (Last 24 hours) at 01/05/2024 0720 Last data filed at 01/04/2024 2100 Gross per 24 hour  Intake 1080 ml  Output 275 ml  Net 805 ml     Physical Exam  In bed, awake but minimally confused, no focal deficits moves all 4 extremities, Foley in place [place].AT,PERRAL Supple Neck, No JVD,   Symmetrical Chest wall movement, Good air movement bilaterally, CTAB RRR,No Gallops,Rubs or new Murmurs,  +ve B.Sounds, Abd Soft, No tenderness,   No edema      Data Review:    Recent Labs  Lab 01/01/24 0415 01/02/24 0744 01/03/24 0727 01/04/24 0649 01/05/24 0547  WBC 4.6 5.5 5.4 5.6 7.4  HGB 10.3* 11.4* 10.5* 10.3* 10.8*  HCT 30.3* 33.2* 30.7* 30.2* 32.2*  PLT 157 164 197 220 231  MCV 100.3* 100.9* 99.4 99.7 100.9*  MCH 34.1* 34.7* 34.0 34.0 33.9  MCHC 34.0 34.3 34.2 34.1 33.5  RDW 18.4* 18.6* 18.5* 18.2* 18.1*  LYMPHSABS 0.9 0.9 1.0 1.1 1.4  MONOABS 0.4 0.5 0.5 0.5 0.8  EOSABS 0.1 0.1 0.1 0.1 0.1  BASOSABS 0.0 0.0 0.0 0.1 0.1    Recent Labs  Lab 12/30/23 0500 12/31/23 0538 01/01/24 0415 01/02/24 0744 01/03/24 0727 01/04/24 0649 01/05/24 0547  NA 135 131* 130* 132* 133* 134* 132*  K 3.6 3.6 3.0* 3.7 3.3* 3.2* 3.4*  CL 101 98 99 100 103 103 104  CO2 28 26 25 23 25 23 22   ANIONGAP 6 7 6 9 5 8 6   GLUCOSE 110* 103* 96 79 84 122* 93  BUN 10 10 8 8 8 7  5*  CREATININE 0.75 0.84 0.79 0.74 0.77 0.76 0.74  AST 75* 67* 65* 78* 63* 58* 56*  ALT 47* 46* 44 52* 44 40 39  ALKPHOS 88 94 91 104 97 94 94  BILITOT 1.1 1.2 1.0 1.0 0.7 0.8 0.7  ALBUMIN  1.5* <1.5* <1.5* <1.5* <1.5* 1.9* 1.8*  CRP 3.4* 3.0*  --   --   --   --   --   PROCALCITON 0.23  0.26  --   --   --   --   --   BNP 233.2* 83.3  --   --   --   --   --   MG 2.0 1.9 1.8 1.9 1.8 1.7  --   PHOS 3.7 3.6 3.0 2.6 2.9 2.6  --   CALCIUM  7.8* 7.4* 7.4* 7.8* 7.5* 7.8* 7.9*      Recent Labs  Lab 12/30/23 0500 12/31/23 0538 01/01/24 0415 01/02/24 0744 01/03/24 0727 01/04/24 0649 01/05/24 0547  CRP 3.4* 3.0*  --   --   --   --   --   PROCALCITON 0.23 0.26  --   --   --   --   --  BNP 233.2* 83.3  --   --   --   --   --   MG 2.0 1.9 1.8 1.9 1.8 1.7  --   CALCIUM  7.8* 7.4* 7.4* 7.8* 7.5* 7.8* 7.9*    --------------------------------------------------------------------------------------------------------------- Lab Results  Component Value Date   CHOL 301 (H) 09/10/2022   HDL 78 09/10/2022   LDLCALC 207 (H) 09/10/2022   TRIG 82 09/10/2022   CHOLHDL 3.9 09/10/2022    Lab Results  Component Value Date   HGBA1C 5.4 09/10/2022   No results for input(s): TSH, T4TOTAL, FREET4, T3FREE, THYROIDAB in the last 72 hours.  No results for input(s): VITAMINB12, FOLATE, FERRITIN, TIBC, IRON, RETICCTPCT in the last 72 hours.  ------------------------------------------------------------------------------------------------------------------ Cardiac Enzymes No results for input(s): CKMB, TROPONINI, MYOGLOBIN in the last 168 hours.  Invalid input(s): CK  Micro Results No results found for this or any previous visit (from the past 240 hours).  Radiology Report No results found.     Signature  -   Lavada Stank M.D on 01/05/2024 at 7:20 AM   -  To page go to [email or web address]

## 2024-01-05 NOTE — Plan of Care (Signed)
  Problem: Nutrition: Goal: Adequate nutrition will be maintained Outcome: Progressing   Problem: Coping: Goal: Level of anxiety will decrease Outcome: Progressing   Problem: Elimination: Goal: Will not experience complications related to bowel motility Outcome: Progressing   Problem: Pain Managment: Goal: General experience of comfort will improve and/or be controlled Outcome: Progressing

## 2024-01-05 NOTE — TOC Progression Note (Addendum)
 Transition of Care [facility]) - Progression Note    Patient Details  Name: Jeffrey Hodges MRN: [id number] Date of Birth: Aug 27, 1965  Transition of Care [facility]) CM/SW Contact  Inocente GORMAN Kindle, LCSW Phone Number: 01/05/2024, 9:38 AM  Clinical Narrative:    CSW inquiring with [facility] or [facility] on bed availability.   12:22 PM-[facility] does not have bed available. [facility] is able to accept patient and will begin insurance authorization process. CSW updated patient's spouse who reported agreement with plan.    Expected Discharge Plan: Skilled Nursing Facility Barriers to Discharge: Continued Medical Work up  Expected Discharge Plan and Services In-house Referral: Clinical Social Work     Living arrangements for the past 2 months: Single Family Home                                       Social Determinants of Health (SDOH) Interventions SDOH Screenings   Food Insecurity: No Food Insecurity (12/26/2023)  Housing: Unknown (12/26/2023)  Transportation Needs: No Transportation Needs (12/26/2023)  Utilities: Not At Risk (12/26/2023)  Depression (PHQ2-9): High Risk (05/13/2023)  Financial Resource Strain: Medium Risk (03/24/2023)  Social Connections: Unknown (03/24/2023)  Stress: Stress Concern Present (03/24/2023)  Tobacco Use: Medium Risk (12/25/2023)    Readmission Risk Interventions     No data to display

## 2024-01-05 NOTE — TOC Progression Note (Signed)
 Transition of Care [facility]) - Progression Note    Patient Details  Name: Jeffrey Hodges MRN: [id number] Date of Birth: 31-Jan-1966  Transition of Care [facility]) CM/SW Contact  Hendricks KANDICE Her, RN Phone Number: 01/05/2024, 9:31 AM  Clinical Narrative:     RNCM called Wife ( Wife had left a VM on RNCM work phone over the weekend) Wife wanted to let me know that she has decided she wants SNF for her Husband at [place]. Wife stated that she spoke with Provider this am and he is aware as well.   TOC will continue to follow patient for any additional discharge needs     Expected Discharge Plan: Skilled Nursing Facility Barriers to Discharge: Continued Medical Work up  Expected Discharge Plan and Services In-house Referral: Clinical Social Work     Living arrangements for the past 2 months: Single Family Home                                       Social Determinants of Health (SDOH) Interventions SDOH Screenings   Food Insecurity: No Food Insecurity (12/26/2023)  Housing: Unknown (12/26/2023)  Transportation Needs: No Transportation Needs (12/26/2023)  Utilities: Not At Risk (12/26/2023)  Depression (PHQ2-9): High Risk (05/13/2023)  Financial Resource Strain: Medium Risk (03/24/2023)  Social Connections: Unknown (03/24/2023)  Stress: Stress Concern Present (03/24/2023)  Tobacco Use: Medium Risk (12/25/2023)    Readmission Risk Interventions     No data to display

## 2024-01-05 NOTE — Progress Notes (Signed)
 PT Cancellation Note  Patient Details Name: Jeffrey Hodges MRN: [id number] DOB: June 20, 1966   Cancelled Treatment:    Reason Eval/Treat Not Completed: Other (comment). Pt incontinent of multiple stools. Did not attempt to mobilize due to this.    Rodgers ORN [facility] 01/05/2024, 2:54 PM Rodgers Opal PT Acute [employer] [phone]

## 2024-01-06 DIAGNOSIS — R5383 Other fatigue: Secondary | ICD-10-CM | POA: Diagnosis not present

## 2024-01-06 LAB — CBC WITH DIFFERENTIAL/PLATELET
Abs Immature Granulocytes: 0.01 10*3/uL (ref 0.00–0.07)
Basophils Absolute: 0 10*3/uL (ref 0.0–0.1)
Basophils Relative: 1 %
Eosinophils Absolute: 0.1 10*3/uL (ref 0.0–0.5)
Eosinophils Relative: 2 %
HCT: 29.9 % — ABNORMAL LOW (ref 39.0–52.0)
Hemoglobin: 10.4 g/dL — ABNORMAL LOW (ref 13.0–17.0)
Immature Granulocytes: 0 %
Lymphocytes Relative: 20 %
Lymphs Abs: 1.3 10*3/uL (ref 0.7–4.0)
MCH: 34.4 pg — ABNORMAL HIGH (ref 26.0–34.0)
MCHC: 34.8 g/dL (ref 30.0–36.0)
MCV: 99 fL (ref 80.0–100.0)
Monocytes Absolute: 0.5 10*3/uL (ref 0.1–1.0)
Monocytes Relative: 8 %
Neutro Abs: 4.7 10*3/uL (ref 1.7–7.7)
Neutrophils Relative %: 69 %
Platelets: 209 10*3/uL (ref 150–400)
RBC: 3.02 MIL/uL — ABNORMAL LOW (ref 4.22–5.81)
RDW: 18.1 % — ABNORMAL HIGH (ref 11.5–15.5)
WBC: 6.8 10*3/uL (ref 4.0–10.5)
nRBC: 0 % (ref 0.0–0.2)

## 2024-01-06 LAB — BASIC METABOLIC PANEL WITH GFR
Anion gap: 5 (ref 5–15)
BUN: 6 mg/dL (ref 6–20)
CO2: 22 mmol/L (ref 22–32)
Calcium: 7.5 mg/dL — ABNORMAL LOW (ref 8.9–10.3)
Chloride: 102 mmol/L (ref 98–111)
Creatinine, Ser: 0.74 mg/dL (ref 0.61–1.24)
GFR, Estimated: >60 mL/min (ref >60)
Glucose, Bld: 91 mg/dL (ref 70–99)
Potassium: 3.7 mmol/L (ref 3.5–5.1)
Sodium: 129 mmol/L — ABNORMAL LOW (ref 135–145)

## 2024-01-06 LAB — MAGNESIUM: Magnesium: 1.9 mg/dL (ref 1.7–2.4)

## 2024-01-06 MED ORDER — FUROSEMIDE 10 MG/ML IJ SOLN
40.0000 mg | Freq: Once | INTRAMUSCULAR | Status: AC
  Administered 2024-01-06: 40 mg via INTRAVENOUS
  Filled 2024-01-06: qty 4

## 2024-01-06 MED ORDER — POTASSIUM CHLORIDE CRYS ER 20 MEQ PO TBCR
40.0000 meq | EXTENDED_RELEASE_TABLET | Freq: Once | ORAL | Status: AC
  Administered 2024-01-06: 40 meq via ORAL
  Filled 2024-01-06: qty 2

## 2024-01-06 MED ORDER — THIAMINE HCL 100 MG PO TABS: 100.0000 mg | ORAL_TABLET | Freq: Every day | ORAL | Status: AC

## 2024-01-06 MED ORDER — LACTULOSE 10 GM/15ML PO SOLN: 20.0000 g | Freq: Two times a day (BID) | ORAL | Status: DC

## 2024-01-06 MED ORDER — FUROSEMIDE 40 MG PO TABS: 40.0000 mg | ORAL_TABLET | Freq: Two times a day (BID) | ORAL | Status: DC

## 2024-01-06 MED ORDER — NICOTINE 21 MG/24HR TD PT24: 21.0000 mg | MEDICATED_PATCH | Freq: Every day | TRANSDERMAL | Status: AC

## 2024-01-06 MED ORDER — PANTOPRAZOLE SODIUM 40 MG PO TBEC
40.0000 mg | DELAYED_RELEASE_TABLET | Freq: Every day | ORAL | Status: AC
Start: 2024-01-06 — End: ?

## 2024-01-06 MED ORDER — SPIRONOLACTONE 25 MG PO TABS: 25.0000 mg | ORAL_TABLET | Freq: Every day | ORAL | Status: DC

## 2024-01-06 MED ORDER — TAMSULOSIN HCL 0.4 MG PO CAPS: 0.4000 mg | ORAL_CAPSULE | Freq: Every day | ORAL | Status: AC

## 2024-01-06 NOTE — TOC Progression Note (Signed)
 Transition of Care [facility]) - Progression Note    Patient Details  Name: Jeffrey Hodges MRN: [id number] Date of Birth: [birth year]  Transition of Care [facility]) CM/SW Contact  Inocente GORMAN Kindle, LCSW Phone Number: 01/06/2024, 9:52 AM  Clinical Narrative:    9:53 AM-[facility] awaiting insurance approval. Patient's plan has a 25% copay.   Expected Discharge Plan: Skilled Nursing Facility Barriers to Discharge: Continued Medical Work up  Expected Discharge Plan and Services In-house Referral: Clinical Social Work     Living arrangements for the past 2 months: Single Family Home Expected Discharge Date: 01/06/24                                     Social Determinants of Health (SDOH) Interventions SDOH Screenings   Food Insecurity: No Food Insecurity (12/26/2023)  Housing: Unknown (12/26/2023)  Transportation Needs: No Transportation Needs (12/26/2023)  Utilities: Not At Risk (12/26/2023)  Depression (PHQ2-9): High Risk (05/13/2023)  Financial Resource Strain: Medium Risk (03/24/2023)  Social Connections: Unknown (03/24/2023)  Stress: Stress Concern Present (03/24/2023)  Tobacco Use: Medium Risk (12/25/2023)    Readmission Risk Interventions     No data to display

## 2024-01-06 NOTE — Progress Notes (Signed)
 Patient unwitnessed fall, pt. Found on the floor by staff. Assessed patient, denied hitting head, pain score 0, a&Ox3 same as baseline. MD notified, no new orders. Family notified as well. Before patient fell. Bed alarm was on, call bell within reach. According to patient he woke up from nap, and thought he was at home, he got up and tried putting on personal clothing.  Patient in bed, high risk fall protocol continued.

## 2024-01-06 NOTE — Progress Notes (Signed)
 Physical Therapy Treatment Patient Details Name: Jeffrey Hodges MRN: [id number] DOB: 1965-08-07 Today's Date: 01/06/2024   History of Present Illness 58 y.o. male brought into the hospital 6/12 due to less responsiveness and abnormal behavior. Patient was admitted with hepatic encephalopathy. PMH: heart disease status post stent, heavy alcohol abuse, anemia, AKI, depression anxiety, elevated LFTs, hyperlipidemia, tobacco abuse, essential hypertension.    PT Comments  Pt received sitting up in bed; reports episode of bowel incontinence, however, had not utilized call bell feature to alert staff. Pt assisted to standing position and assist provided for posterior and anterior peri care. Pt ambulating limited hallway distance with RW and min assist for balance. Demonstrates ataxic gait with a few episodes of lateral loss of balance. HR up to 127 bpm. Pt presents as a high fall risk based on history of falls and decreased safety awareness. Patient will benefit from continued inpatient follow up therapy, <3 hours/day in order to address deficits, maximize functional independence and decrease caregiver burden.    If plan is discharge home, recommend the following: Assistance with cooking/housework;Direct supervision/assist for medications management;Direct supervision/assist for financial management;Help with stairs or ramp for entrance;Assist for transportation;Supervision due to cognitive status;A little help with walking and/or transfers;A little help with bathing/dressing/bathroom   Can travel by private vehicle     Yes  Equipment Recommendations  Other (comment) (defer)    Recommendations for Other Services       Precautions / Restrictions Precautions Precautions: Fall Precaution/Restrictions Comments: Bowel incontinence Restrictions Weight Bearing Restrictions Per Provider Order: No     Mobility  Bed Mobility Overal bed mobility: Needs Assistance Bed Mobility: Supine to Sit      Supine to sit: Supervision          Transfers Overall transfer level: Needs assistance Equipment used: Rolling walker (2 wheels) Transfers: Sit to/from Stand Sit to Stand: Contact guard assist                Ambulation/Gait Ambulation/Gait assistance: Min assist Gait Distance (Feet): 120 Feet Assistive device: Rolling walker (2 wheels) Gait Pattern/deviations: Step-through pattern, Decreased stride length, Drifts right/left, Wide base of support, Ataxic       General Gait Details: Pt requiring up to min assist due to lateral LOB, verbal cueing for obstacle and environmental navigation, overall good cadence   Stairs             Wheelchair Mobility     Tilt Bed    Modified Rankin (Stroke Patients Only)       Balance Overall balance assessment: Needs assistance Sitting-balance support: No upper extremity supported, Feet supported Sitting balance-Leahy Scale: Good     Standing balance support: Bilateral upper extremity supported, Reliant on assistive device for balance Standing balance-Leahy Scale: Poor                              Communication Communication Communication: No apparent difficulties  Cognition Arousal: Alert Behavior During Therapy: Flat affect   PT - Cognitive impairments: Awareness, Memory, Attention, Initiation, Sequencing, Problem solving, Safety/Judgement                         Following commands: Impaired Following commands impaired: Follows multi-step commands inconsistently    Cueing Cueing Techniques: Verbal cues  Exercises      General Comments        Pertinent Vitals/Pain Pain Assessment Pain Assessment: No/denies pain  Home Living                          Prior Function            PT Goals (current goals can now be found in the care plan section) Acute Rehab PT Goals Patient Stated Goal: go home Potential to Achieve Goals: Good Progress towards PT goals:  Progressing toward goals    Frequency    Min 2X/week      PT Plan      Co-evaluation              AM-PAC PT 6 Clicks Mobility   Outcome Measure  Help needed turning from your back to your side while in a flat bed without using bedrails?: None Help needed moving from lying on your back to sitting on the side of a flat bed without using bedrails?: A Little Help needed moving to and from a bed to a chair (including a wheelchair)?: A Little Help needed standing up from a chair using your arms (e.g., wheelchair or bedside chair)?: A Little Help needed to walk in hospital room?: A Little Help needed climbing 3-5 steps with a railing? : A Lot 6 Click Score: 18    End of Session Equipment Utilized During Treatment: Gait belt Activity Tolerance: Patient tolerated treatment well Patient left: with call bell/phone within reach;in chair;with chair alarm set Nurse Communication: Mobility status;Other (comment) (needs new bed linens) PT Visit Diagnosis: Unsteadiness on feet (R26.81);Other abnormalities of gait and mobility (R26.89);Other symptoms and signs involving the nervous system (R29.898);Ataxic gait (R26.0);History of falling (Z91.81);Muscle weakness (generalized) (M62.81);Repeated falls (R29.6)     Time: 8840-8769 PT Time Calculation (min) (ACUTE ONLY): 31 min  Charges:    $Therapeutic Activity: 23-37 mins PT General Charges $$ ACUTE PT VISIT: 1 Visit                     Aleck Hodges, PT, DPT Acute Rehabilitation Services Office [phone]    Jeffrey Hodges 01/06/2024, 12:54 PM

## 2024-01-06 NOTE — Discharge Instructions (Signed)
 Follow with Primary MD [name], NP in 7 days   Get CBC, CMP, Magnesium , INR, 2 view Chest X ray -  checked next visit with your primary MD or SNF MD   Activity: As tolerated with Full fall precautions use walker/cane & assistance as needed  Disposition SNF  Diet: Heart Healthy with strict 1.2 L fluid restriction per day  Special Instructions: If you have smoked or chewed Tobacco  in the last 2 yrs please stop smoking, stop any regular Alcohol  and or any Recreational drug use.  On your next visit with your primary care physician please Get Medicines reviewed and adjusted.  Please request your Prim.MD to go over all Hospital Tests and Procedure/Radiological results at the follow up, please get all Hospital records sent to your Prim MD by signing hospital release before you go home.  If you experience worsening of your admission symptoms, develop shortness of breath, life threatening emergency, suicidal or homicidal thoughts you must seek medical attention immediately by calling 911 or calling your MD immediately  if symptoms less severe.  You Must read complete instructions/literature along with all the possible adverse reactions/side effects for all the Medicines you take and that have been prescribed to you. Take any new Medicines after you have completely understood and accpet all the possible adverse reactions/side effects.   Do not drive when taking Pain medications.  Do not take more than prescribed Pain, Sleep and Anxiety Medications  Wear Seat belts while driving.

## 2024-01-06 NOTE — Discharge Summary (Signed)
 Jeffrey Hodges FMW:[id number] DOB: 03/16/1966 DOA: 12/25/2023  PCP: [name], NP  Admit date: 12/25/2023  Discharge date: 01/06/2024  Admitted From: Home   Disposition:  SNF   Recommendations for Outpatient Follow-up:   Follow up with PCP in 1-2 weeks  PCP Please obtain BMP/CBC, 2 view CXR in 1week,  (see Discharge instructions)   PCP Please follow up on the following pending results:    Home Health: None   Equipment/Devices: None  Consultations: None  Discharge Condition: Stable    CODE STATUS: Full    Diet Recommendation: Heart Healthy with strict 1.2 L fluid restriction per day    Chief Complaint  Patient presents with   Alcohol Problem     Brief history of present illness from the day of admission and additional interim summary    58 y.o. male with past medical history  of heart disease status post stent, heavy alcohol abuse, anemia, AKI, depression anxiety, elevated LFTs, hyperlipidemia, tobacco abuse, essential hypertension, was brought into the hospital due to less responsiveness and abnormal behavior.  Patient sees GI and cardiology.  It is being suspected that patient drinks 2 bottles of liquor essentially every day.  Patient was admitted with hepatic encephalopathy.                                                                  Hospital Course   Acute metabolic encephalopathy, suspicious for Wernicke's encephalopathy and now after 4 days going into DTs: Patient was admitted with ammonia levels of around 49 which is close to his baseline, ammonia levels have trended down, question if he is developing Wernicke's encephalopathy, placed on high-dose IV thiamine , CIWA protocol and Librium ,   Continue IV fluids, PT and speech eval and monitor.  Stable MRI brain, EEG, RPR TSH and B12 stable.  No  headache or focal deficits, stable thiamine  levels however he had received few thiamine  doses prior to it.  He is now back to baseline will be discharged on thiamine  and folic acid  combination, strictly counseled to abstain from alcohol.  No signs of DTs anymore.   Alcohol dependence and ongoing smoking: Was in DTs now resolved.  Consult to quit both, NicoDerm patch upon discharge   Dehydration, hypokalemia, hypophosphatemia, hypomagnesemia: Rehydrated with IV fluids and electrolytes replaced and now stable.   Alcoholic hepatitis/elevated LFTs: Mild, continue to monitor, INR is relatively stable.   Chronic hyponatremia: Due to alcoholism and fluid overload, strictly counseled on fluid restriction, wife also updated, abstain from alcohol, placed on Lasix  and Aldactone, check BMP in 3 to 5 days at [facility] and adjust further.   History of CAD status post stent, elevated troponin, it was mild and nonspecific: Stent placed in February 2024.  Troponin elevated but flat.  Patient with no ACS symptoms.  Likely demand ischemia.  He had no chest pain, EKG nonacute, echocardiogram stable with preserved EF and no wall motion abnormality, before coming here patient was on aspirin  and statin, he had stopped Brilinta  several months ago.  No acute issues.  Post discharge follow-up with primary cardiologist in 1 to 2 weeks.   Anxiety/depression: Resuming Prozac  but holding BuSpar  for now.   Hyperlipidemia: Resume statin.  Looks like he is not taking at home.   Bladder outlet obstruction.  Foley, Flomax , Foley was to be removed 01/05/2024 but patient refused, finally removed 6 AM 01/06/2024, continue to monitor urine output.  Needs outpatient follow-up with urology in 2 to 3 weeks.  Will discharge on Flomax .   Discharge diagnosis     Principal Problem:   Lethargy Active Problems:   Acute hepatic encephalopathy [facility])    Discharge instructions    Discharge Instructions     Discharge instructions   Complete by:  As directed    Follow with Primary MD [name], NP in 7 days   Get CBC, CMP, Magnesium , INR, 2 view Chest X ray -  checked next visit with your primary MD or SNF MD   Activity: As tolerated with Full fall precautions use walker/cane & assistance as needed  Disposition SNF  Diet: Heart Healthy with strict 1.2 L fluid restriction per day  Special Instructions: If you have smoked or chewed Tobacco  in the last 2 yrs please stop smoking, stop any regular Alcohol  and or any Recreational drug use.  On your next visit with your primary care physician please Get Medicines reviewed and adjusted.  Please request your Prim.MD to go over all Hospital Tests and Procedure/Radiological results at the follow up, please get all Hospital records sent to your Prim MD by signing hospital release before you go home.  If you experience worsening of your admission symptoms, develop shortness of breath, life threatening emergency, suicidal or homicidal thoughts you must seek medical attention immediately by calling 911 or calling your MD immediately  if symptoms less severe.  You Must read complete instructions/literature along with all the possible adverse reactions/side effects for all the Medicines you take and that have been prescribed to you. Take any new Medicines after you have completely understood and accpet all the possible adverse reactions/side effects.   Do not drive when taking Pain medications.  Do not take more than prescribed Pain, Sleep and Anxiety Medications  Wear Seat belts while driving.   Increase activity slowly   Complete by: As directed    No wound care   Complete by: As directed        Discharge Medications   Allergies as of 01/06/2024   No Known Allergies      Medication List     STOP taking these medications    Brilinta  90 MG Tabs tablet Generic drug: ticagrelor    LORazepam  0.5 MG tablet Commonly known as: ATIVAN    naltrexone  50 MG tablet Commonly known  as: DEPADE       TAKE these medications    aspirin  EC 81 MG tablet Take 1 tablet (81 mg total) by mouth daily. Swallow whole.   atorvastatin  80 MG tablet Commonly known as: LIPITOR Take 1 tablet (80 mg total) by mouth daily.   busPIRone  10 MG tablet Commonly known as: BUSPAR  TAKE 1 TABLET BY MOUTH TWICE A DAY   cyanocobalamin  1000 MCG tablet Commonly known as: VITAMIN B12 Take 1 tablet (1,000 mcg total) by mouth daily.  FLUoxetine  40 MG capsule Commonly known as: PROZAC  TAKE 1 CAPSULE (40 MG TOTAL) BY MOUTH DAILY.   folic acid  1 MG tablet Commonly known as: FOLVITE  Take 1 tablet (1 mg total) by mouth daily.   furosemide  40 MG tablet Commonly known as: Lasix  Take 1 tablet (40 mg total) by mouth 2 (two) times daily.   lactulose  10 GM/15ML solution Commonly known as: CHRONULAC  Take 30 mLs (20 g total) by mouth 2 (two) times daily.   nicotine  21 mg/24hr patch Commonly known as: NICODERM CQ  - dosed in mg/24 hours Place 1 patch (21 mg total) onto the skin daily.   nitroGLYCERIN  0.4 MG SL tablet Commonly known as: NITROSTAT  Place 1 tablet (0.4 mg total) under the tongue every 5 (five) minutes x 3 doses as needed for chest pain.   pantoprazole  40 MG tablet Commonly known as: PROTONIX  Take 1 tablet (40 mg total) by mouth daily.   spironolactone 25 MG tablet Commonly known as: Aldactone Take 1 tablet (25 mg total) by mouth daily.   tamsulosin  0.4 MG Caps capsule Commonly known as: FLOMAX  Take 1 capsule (0.4 mg total) by mouth daily.   thiamine  100 MG tablet Commonly known as: VITAMIN B1 Take 1 tablet (100 mg total) by mouth daily.   Vitamin D  (Ergocalciferol ) 1.25 MG (50000 UNIT) Caps capsule Commonly known as: DRISDOL  X 24 weeks What changed:  how much to take how to take this when to take this additional instructions         Contact information for follow-up providers     [name], NP. Schedule an appointment as soon as possible for a visit  in 1 week(s).   Specialties: Nurse Practitioner, Family Medicine Contact information: [address] [place] [address] [phone]         Elicia Claw, MD. Schedule an appointment as soon as possible for a visit in 1 week(s).   Specialty: Gastroenterology Contact information: [address] [place] [address] [phone]              Contact information for after-discharge care     Destination     [facility] .   Service: Skilled Nursing Contact information: [address] [place]  [address] [phone]                     Major procedures and Radiology Reports - PLEASE review detailed and final reports thoroughly  -     DG Chest [facility] 1 View Result Date: 01/02/2024 CLINICAL DATA:  Shortness of breath, lethargy. EXAM: PORTABLE CHEST 1 VIEW COMPARISON:  12/30/2023 FINDINGS: Stable cardiomediastinal contours. No significant pleural effusion or interstitial edema. Patchy opacities within the right mid and right lower lung compatible with atelectasis or airspace disease. The appearance is unchanged from 12/30/2023. Left lung is clear. IMPRESSION: Patchy opacities within the right mid and right lower lung compatible with atelectasis or airspace disease. Appearance is unchanged from 12/30/2023. Electronically Signed   By: Waddell Calk M.D.   On: 01/02/2024 06:20   DG Chest Port 1 View Result Date: 12/30/2023 CLINICAL DATA:  58 year old male with shortness of breath. EXAM: PORTABLE CHEST 1 VIEW COMPARISON:  Portable chest yesterday and earlier. FINDINGS: Portable AP semi upright view at 0655 hours. Continued low lung volumes. Normal cardiac size and mediastinal contours. Visualized tracheal air column is within normal limits. No pneumothorax, pleural effusion, consolidation, or pulmonary edema. Mild lung base atelectasis, more apparent on the  right. No acute osseous abnormality identified. Negative visible bowel gas.  IMPRESSION: Low lung volumes with right greater than left lung base atelectasis. Electronically Signed   By: VEAR Hurst M.D.   On: 12/30/2023 07:13   DG Abd Portable 1V Result Date: 12/29/2023 CLINICAL DATA:  Nausea EXAM: PORTABLE ABDOMEN - 1 VIEW COMPARISON:  None Available. FINDINGS: The bowel gas pattern is normal. No radio-opaque calculi or other significant radiographic abnormality are seen. IMPRESSION: Negative. Electronically Signed   By: Franky Chard M.D.   On: 12/29/2023 11:39   US  RENAL Result Date: 12/29/2023 CLINICAL DATA:  Acute kidney injury. EXAM: RENAL / URINARY TRACT ULTRASOUND COMPLETE COMPARISON:  04/02/2023 FINDINGS: Right Kidney: Renal measurements: 10.4 x 5.0 x 6.8 cm = volume: 184 mL. Echogenicity within normal limits. No mass or hydronephrosis visualized. Left Kidney: Renal measurements: 11.0 x 6.1 x 5.9 cm = volume: 208 mL. Echogenicity within normal limits. No mass or hydronephrosis visualized. Bladder: Decompressed. Other: Moderate volume ascites evident. IMPRESSION: 1. No evidence for hydronephrosis. 2. Moderate volume ascites. Electronically Signed   By: Camellia Candle M.D.   On: 12/29/2023 08:39   DG Chest Port 1 View Result Date: 12/29/2023 CLINICAL DATA:  Shortness of breath. History of heart disease status post stenting. EXAM: PORTABLE CHEST 1 VIEW COMPARISON:  Radiographs 04/02/2023 and 09/10/2022. FINDINGS: 0806 hours. Lordotic positioning with lower lung volumes. The heart size and mediastinal contours are normal. The lungs are clear. There is no pleural effusion or pneumothorax. No acute osseous findings are identified. Telemetry leads overlie the chest. IMPRESSION: No evidence of acute cardiopulmonary process. Electronically Signed   By: Elsie Perone M.D.   On: 12/29/2023 08:30   ECHOCARDIOGRAM COMPLETE Result Date: 12/27/2023    ECHOCARDIOGRAM REPORT   Patient Name:   Jeffrey Hodges Date of Exam: 12/27/2023 Medical Rec #:  [id number]     Height:       72.0 in Accession  #:    [id number]    Weight:       185.6 lb Date of Birth:  12/27/1965    BSA:          2.064 m Patient Age:    57 years      BP:           138/84 mmHg Patient Gender: M             HR:           99 bpm. Exam Location:  Inpatient Procedure: 2D Echo, Cardiac Doppler and Color Doppler (Both Spectral and Color            Flow Doppler were utilized during procedure). Indications:    Acute ischemic heart disease, unspecified I24.9  History:        Patient has prior history of Echocardiogram examinations, most                 recent 04/03/2023. CAD; Risk Factors:Hypertension, Dyslipidemia                 and Former Smoker.  Sonographer:    Koleen Popper RDCS Referring Phys: JACQUELINE LAVADA POUR [facility]  Sonographer Comments: Suboptimal parasternal window. Image acquisition challenging due to uncooperative patient. IMPRESSIONS  1. Left ventricular ejection fraction, by estimation, is 55 to 60%. The left ventricle has normal function. The left ventricle has no regional wall motion abnormalities. There is mild left ventricular hypertrophy. Left ventricular diastolic parameters were normal.  2. Right ventricular systolic function is normal. The  right ventricular size is normal. Tricuspid regurgitation signal is inadequate for assessing PA pressure.  3. The mitral valve is grossly normal. Trivial mitral valve regurgitation. No evidence of mitral stenosis.  4. The aortic valve is grossly normal. Aortic valve regurgitation is not visualized. No aortic stenosis is present.  5. The inferior vena cava is normal in size with <50% respiratory variability, suggesting right atrial pressure of 8 mmHg. FINDINGS  Left Ventricle: Left ventricular ejection fraction, by estimation, is 55 to 60%. The left ventricle has normal function. The left ventricle has no regional wall motion abnormalities. The left ventricular internal cavity size was normal in size. There is  mild left ventricular hypertrophy. Left ventricular diastolic parameters were  normal. Right Ventricle: The right ventricular size is normal. No increase in right ventricular wall thickness. Right ventricular systolic function is normal. Tricuspid regurgitation signal is inadequate for assessing PA pressure. Left Atrium: Left atrial size was normal in size. Right Atrium: Right atrial size was normal in size. Pericardium: There is no evidence of pericardial effusion. Mitral Valve: The mitral valve is grossly normal. Trivial mitral valve regurgitation. No evidence of mitral valve stenosis. Tricuspid Valve: The tricuspid valve is normal in structure. Tricuspid valve regurgitation is trivial. No evidence of tricuspid stenosis. Aortic Valve: The aortic valve is grossly normal. Aortic valve regurgitation is not visualized. No aortic stenosis is present. Pulmonic Valve: The pulmonic valve was normal in structure. Pulmonic valve regurgitation is not visualized. No evidence of pulmonic stenosis. Aorta: The aortic root is normal in size and structure. Venous: The inferior vena cava is normal in size with less than 50% respiratory variability, suggesting right atrial pressure of 8 mmHg. IAS/Shunts: No atrial level shunt detected by color flow Doppler.  LEFT VENTRICLE PLAX 2D LVIDd:         4.50 cm   Diastology LVIDs:         2.90 cm   LV e' medial:    8.05 cm/s LV PW:         1.10 cm   LV E/e' medial:  10.1 LV IVS:        0.90 cm   LV e' lateral:   12.30 cm/s LVOT diam:     1.80 cm   LV E/e' lateral: 6.6 LV SV:         56 LV SV Index:   27 LVOT Area:     2.54 cm  RIGHT VENTRICLE RV S prime:     18.95 cm/s LEFT ATRIUM             Index        RIGHT ATRIUM           Index LA diam:        3.70 cm 1.79 cm/m   RA Area:     13.60 cm LA Vol (A2C):   24.2 ml 11.72 ml/m  RA Volume:   28.90 ml  14.00 ml/m LA Vol (A4C):   43.8 ml 21.22 ml/m LA Biplane Vol: 36.2 ml 17.54 ml/m  AORTIC VALVE LVOT Vmax:   115.00 cm/s LVOT Vmean:  78.600 cm/s LVOT VTI:    0.222 m  AORTA Ao Root diam: 3.30 cm MITRAL VALVE MV Area  (PHT): 4.49 cm    SHUNTS MV Decel Time: 169 msec    Systemic VTI:  0.22 m MV E velocity: 81.20 cm/s  Systemic Diam: 1.80 cm MV A velocity: 93.30 cm/s MV E/A ratio:  0.87 Soyla Merck MD Electronically signed by  Soyla Merck MD Signature Date/Time: 12/27/2023/4:02:19 PM    Final    MR BRAIN WO CONTRAST Result Date: 12/25/2023 CLINICAL DATA:  Initial evaluation for acute mental status change, unknown cause. EXAM: MRI HEAD WITHOUT CONTRAST TECHNIQUE: Multiplanar, multiecho pulse sequences of the brain and surrounding structures were obtained without intravenous contrast. COMPARISON:  CT from earlier the same day. FINDINGS: Brain: Examination moderately to severely degraded by motion artifact. Cerebral volume within normal limits. Patchy T2/FLAIR hyperintensity involving the periventricular and deep white matter both cerebral hemispheres, most characteristic of chronic microvascular ischemic disease, mild in nature. No visible foci of restricted diffusion to suggest acute or subacute ischemia. No areas of chronic cortical infarction. No acute or chronic intracranial blood products. No mass lesion, midline shift or mass effect. No hydrocephalus or extra-axial fluid collection. Pituitary gland within normal limits. Vascular: Major intracranial vascular flow voids are maintained. Skull and upper cervical spine: Cranial junction within normal limits. Bone marrow signal intensity normal. No scalp soft tissue abnormality. Sinuses/Orbits: Postoperative changes noted about the left globe. Globes orbital soft tissues demonstrate no acute abnormality. Paranasal sinuses are largely clear. No significant mastoid effusion. Other: None. IMPRESSION: 1. Motion degraded exam. 2. No acute intracranial abnormality. 3. Mild chronic microvascular ischemic disease for age. Electronically Signed   By: Morene Hoard M.D.   On: 12/25/2023 21:28   CT Head Wo Contrast Result Date: 12/25/2023 CLINICAL DATA:  Mental status  change, unknown cause EXAM: CT HEAD WITHOUT CONTRAST TECHNIQUE: Contiguous axial images were obtained from the base of the skull through the vertex without intravenous contrast. RADIATION DOSE REDUCTION: This exam was performed according to the departmental dose-optimization program which includes automated exposure control, adjustment of the mA and/or kV according to patient size and/or use of iterative reconstruction technique. COMPARISON:  April 02, 2023 FINDINGS: Brain: Proportional prominence of the ventricles and sulci, consistent with diffuse cerebral parenchymal volume loss. The ventricles otherwise maintained midline position without midline shift. Gray-white differentiation is preserved without focal attenuation abnormality.No evidence of acute territorial infarction, extra-axial fluid collection, hemorrhage, or mass lesion. The basilar cisterns are patent without downward herniation. The cerebellar hemispheres and vermis are well formed without mass lesion or focal attenuation abnormality. Vascular: No hyperdense vessel. Skull: Normal. Negative for fracture or focal lesion. Sinuses/Orbits: Significant leftward deviation of the nasal septum. The paranasal sinuses and mastoids are clear.The globes appear intact. No retrobulbar hematoma.Postsurgical changes of the left globe with scleral band in place. Dislocated lens layering dependently. Other: None. IMPRESSION: No acute intracranial abnormality, specifically, no acute hemorrhage, territorial infarction, or intracranial mass.s Electronically Signed   By: Rogelia Myers M.D.   On: 12/25/2023 15:32    Micro Results    No results found for this or any previous visit (from the past 240 hours).  Today   Subjective    Jeffrey Hodges today has no headache,no chest abdominal pain,no new weakness tingling or numbness, feels much better wants to go home today.    Objective   Blood pressure 107/71, pulse 87, temperature 98.2 F (36.8 C),  temperature source Oral, resp. rate 17, height 6' (1.829 m), weight 93.2 kg, SpO2 93%.   Intake/Output Summary (Last 24 hours) at 01/06/2024 0829 Last data filed at 01/06/2024 0630 Gross per 24 hour  Intake --  Output 520 ml  Net -520 ml    Exam  Awake Alert, No new F.N deficits,    [place].AT,PERRAL Supple Neck,   Symmetrical Chest wall movement, Good air movement bilaterally, CTAB RRR,No  Gallops,   +ve B.Sounds, Abd Soft, Non tender,  1+ edema    Data Review   Recent Labs  Lab 01/02/24 0744 01/03/24 0727 01/04/24 0649 01/05/24 0547 01/06/24 0540  WBC 5.5 5.4 5.6 7.4 6.8  HGB 11.4* 10.5* 10.3* 10.8* 10.4*  HCT 33.2* 30.7* 30.2* 32.2* 29.9*  PLT 164 197 220 231 209  MCV 100.9* 99.4 99.7 100.9* 99.0  MCH 34.7* 34.0 34.0 33.9 34.4*  MCHC 34.3 34.2 34.1 33.5 34.8  RDW 18.6* 18.5* 18.2* 18.1* 18.1*  LYMPHSABS 0.9 1.0 1.1 1.4 1.3  MONOABS 0.5 0.5 0.5 0.8 0.5  EOSABS 0.1 0.1 0.1 0.1 0.1  BASOSABS 0.0 0.0 0.1 0.1 0.0    Recent Labs  Lab 12/31/23 0538 01/01/24 0415 01/02/24 0744 01/03/24 0727 01/04/24 0649 01/05/24 0547 01/06/24 0540  NA 131* 130* 132* 133* 134* 132* 129*  K 3.6 3.0* 3.7 3.3* 3.2* 3.4* 3.7  CL 98 99 100 103 103 104 102  CO2 26 25 23 25 23 22 22   ANIONGAP 7 6 9 5 8 6 5   GLUCOSE 103* 96 79 84 122* 93 91  BUN 10 8 8 8 7  5* 6  CREATININE 0.84 0.79 0.74 0.77 0.76 0.74 0.74  AST 67* 65* 78* 63* 58* 56*  --   ALT 46* 44 52* 44 40 39  --   ALKPHOS 94 91 104 97 94 94  --   BILITOT 1.2 1.0 1.0 0.7 0.8 0.7  --   ALBUMIN  <1.5* <1.5* <1.5* <1.5* 1.9* 1.8*  --   CRP 3.0*  --   --   --   --   --   --   PROCALCITON 0.26  --   --   --   --   --   --   BNP 83.3  --   --   --   --   --   --   MG 1.9 1.8 1.9 1.8 1.7  --  1.9  PHOS 3.6 3.0 2.6 2.9 2.6  --   --   CALCIUM  7.4* 7.4* 7.8* 7.5* 7.8* 7.9* 7.5*    Total Time in preparing paper work, data evaluation and todays exam - 35 minutes  Signature  -    Lavada Stank M.D on 01/06/2024 at 8:29 AM   -  To page  go to [email or web address]

## 2024-01-06 NOTE — Progress Notes (Signed)
 Foley catheter removed at this time.

## 2024-01-06 NOTE — Plan of Care (Signed)
  Problem: Health Behavior/Discharge Planning: Goal: Ability to manage health-related needs will improve Outcome: Progressing   Problem: Clinical Measurements: Goal: Will remain free from infection Outcome: Progressing   Problem: Activity: Goal: Risk for activity intolerance will decrease Outcome: Progressing   Problem: Safety: Goal: Ability to remain free from injury will improve Outcome: Progressing   Problem: Skin Integrity: Goal: Risk for impaired skin integrity will decrease Outcome: Progressing   

## 2024-01-06 NOTE — Plan of Care (Signed)

## 2024-01-07 DIAGNOSIS — M6281 Muscle weakness (generalized): Secondary | ICD-10-CM | POA: Diagnosis not present

## 2024-01-07 DIAGNOSIS — R4182 Altered mental status, unspecified: Secondary | ICD-10-CM | POA: Diagnosis not present

## 2024-01-07 DIAGNOSIS — Z7401 Bed confinement status: Secondary | ICD-10-CM | POA: Diagnosis not present

## 2024-01-07 DIAGNOSIS — R5381 Other malaise: Secondary | ICD-10-CM | POA: Diagnosis not present

## 2024-01-07 DIAGNOSIS — F411 Generalized anxiety disorder: Secondary | ICD-10-CM | POA: Diagnosis not present

## 2024-01-07 DIAGNOSIS — D649 Anemia, unspecified: Secondary | ICD-10-CM | POA: Diagnosis not present

## 2024-01-07 DIAGNOSIS — E512 Wernicke's encephalopathy: Secondary | ICD-10-CM | POA: Diagnosis not present

## 2024-01-07 DIAGNOSIS — R7401 Elevation of levels of liver transaminase levels: Secondary | ICD-10-CM | POA: Diagnosis not present

## 2024-01-07 DIAGNOSIS — F331 Major depressive disorder, recurrent, moderate: Secondary | ICD-10-CM | POA: Diagnosis not present

## 2024-01-07 DIAGNOSIS — K7682 Hepatic encephalopathy: Secondary | ICD-10-CM | POA: Diagnosis not present

## 2024-01-07 DIAGNOSIS — I2510 Atherosclerotic heart disease of native coronary artery without angina pectoris: Secondary | ICD-10-CM | POA: Diagnosis not present

## 2024-01-07 DIAGNOSIS — F410 Panic disorder [episodic paroxysmal anxiety] without agoraphobia: Secondary | ICD-10-CM | POA: Diagnosis not present

## 2024-01-07 DIAGNOSIS — R1313 Dysphagia, pharyngeal phase: Secondary | ICD-10-CM | POA: Diagnosis not present

## 2024-01-07 DIAGNOSIS — F1010 Alcohol abuse, uncomplicated: Secondary | ICD-10-CM | POA: Diagnosis not present

## 2024-01-07 DIAGNOSIS — I10 Essential (primary) hypertension: Secondary | ICD-10-CM | POA: Diagnosis not present

## 2024-01-07 NOTE — Discharge Summary (Signed)
 Jeffrey Hodges FMW:[id number] DOB: 05/10/1966 DOA: 12/25/2023  PCP: [name], NP  Admit date: 12/25/2023  Discharge date: 01/07/2024  Admitted From: Home   Disposition:  SNF  No significant updates on discharge summary dictated by Dr. Dennise 01/02/2024, was awaiting insurance authorization has been obtained today.  Recommendations for Outpatient Follow-up:   Follow up with PCP in 1-2 weeks  PCP Please obtain BMP/CBC, 2 view CXR in 1week,  (see Discharge instructions)   PCP Please follow up on the following pending results:    Home Health: None   Equipment/Devices: None  Consultations: None  Discharge Condition: Stable    CODE STATUS: Full    Diet Recommendation: Heart Healthy with strict 1.2 L fluid restriction per day    Chief Complaint  Patient presents with   Alcohol Problem     Brief history of present illness from the day of admission and additional interim summary    58 y.o. male with past medical history  of heart disease status post stent, heavy alcohol abuse, anemia, AKI, depression anxiety, elevated LFTs, hyperlipidemia, tobacco abuse, essential hypertension, was brought into the hospital due to less responsiveness and abnormal behavior.  Patient sees GI and cardiology.  It is being suspected that patient drinks 2 bottles of liquor essentially every day.  Patient was admitted with hepatic encephalopathy.                                                                  Hospital Course   Acute metabolic encephalopathy, suspicious for Wernicke's encephalopathy and now after 4 days going into DTs: Patient was admitted with ammonia levels of around 49 which is close to his baseline, ammonia levels have trended down, question if he is developing Wernicke's encephalopathy, placed on high-dose IV  thiamine , CIWA protocol and Librium ,   Continue IV fluids, PT and speech eval and monitor.  Stable MRI brain, EEG, RPR TSH and B12 stable.  No headache or focal deficits, stable thiamine  levels however he had received few thiamine  doses prior to it.  He is now back to baseline will be discharged on thiamine  and folic acid  combination, strictly counseled to abstain from alcohol.  No signs of DTs anymore.   Alcohol dependence and ongoing smoking: Was in DTs now resolved.  Consult to quit both, NicoDerm patch upon discharge   Dehydration, hypokalemia, hypophosphatemia, hypomagnesemia: Rehydrated with IV fluids and electrolytes replaced and now stable.   Alcoholic hepatitis/elevated LFTs: Mild, continue to monitor, INR is relatively stable.   Chronic hyponatremia: Due to alcoholism and fluid overload, strictly counseled on fluid restriction, wife also updated, abstain from alcohol, placed on Lasix  and Aldactone, check BMP in 3 to 5 days at [facility] and adjust further.   History of CAD status post stent, elevated troponin, it was  mild and nonspecific: Stent placed in February 2024.  Troponin elevated but flat.  Patient with no ACS symptoms.  Likely demand ischemia.  He had no chest pain, EKG nonacute, echocardiogram stable with preserved EF and no wall motion abnormality, before coming here patient was on aspirin  and statin, he had stopped Brilinta  several months ago.  No acute issues.  Post discharge follow-up with primary cardiologist in 1 to 2 weeks.   Anxiety/depression: Resuming Prozac  but holding BuSpar  for now.   Hyperlipidemia: Resume statin.  Looks like he is not taking at home.   Bladder outlet obstruction.  Foley, Flomax , Foley was to be removed 01/05/2024 but patient refused, finally removed 6 AM 01/06/2024, continue to monitor urine output.  Needs outpatient follow-up with urology in 2 to 3 weeks.  Will discharge on Flomax .   Discharge diagnosis     Principal Problem:   Lethargy Active  Problems:   Acute hepatic encephalopathy [facility])    Discharge instructions    Discharge Instructions     Discharge instructions   Complete by: As directed    Follow with Primary MD [name], NP in 7 days   Get CBC, CMP, Magnesium , INR, 2 view Chest X ray -  checked next visit with your primary MD or SNF MD   Activity: As tolerated with Full fall precautions use walker/cane & assistance as needed  Disposition SNF  Diet: Heart Healthy with strict 1.2 L fluid restriction per day  Special Instructions: If you have smoked or chewed Tobacco  in the last 2 yrs please stop smoking, stop any regular Alcohol  and or any Recreational drug use.  On your next visit with your primary care physician please Get Medicines reviewed and adjusted.  Please request your Prim.MD to go over all Hospital Tests and Procedure/Radiological results at the follow up, please get all Hospital records sent to your Prim MD by signing hospital release before you go home.  If you experience worsening of your admission symptoms, develop shortness of breath, life threatening emergency, suicidal or homicidal thoughts you must seek medical attention immediately by calling 911 or calling your MD immediately  if symptoms less severe.  You Must read complete instructions/literature along with all the possible adverse reactions/side effects for all the Medicines you take and that have been prescribed to you. Take any new Medicines after you have completely understood and accpet all the possible adverse reactions/side effects.   Do not drive when taking Pain medications.  Do not take more than prescribed Pain, Sleep and Anxiety Medications  Wear Seat belts while driving.   Increase activity slowly   Complete by: As directed    No wound care   Complete by: As directed        Discharge Medications   Allergies as of 01/07/2024   No Known Allergies      Medication List     STOP taking these medications     Brilinta  90 MG Tabs tablet Generic drug: ticagrelor    LORazepam  0.5 MG tablet Commonly known as: ATIVAN    naltrexone  50 MG tablet Commonly known as: DEPADE       TAKE these medications    aspirin  EC 81 MG tablet Take 1 tablet (81 mg total) by mouth daily. Swallow whole.   atorvastatin  80 MG tablet Commonly known as: LIPITOR Take 1 tablet (80 mg total) by mouth daily.   busPIRone  10 MG tablet Commonly known as: BUSPAR  TAKE 1 TABLET BY MOUTH TWICE A DAY  cyanocobalamin  1000 MCG tablet Commonly known as: VITAMIN B12 Take 1 tablet (1,000 mcg total) by mouth daily.   FLUoxetine  40 MG capsule Commonly known as: PROZAC  TAKE 1 CAPSULE (40 MG TOTAL) BY MOUTH DAILY.   folic acid  1 MG tablet Commonly known as: FOLVITE  Take 1 tablet (1 mg total) by mouth daily.   furosemide  40 MG tablet Commonly known as: Lasix  Take 1 tablet (40 mg total) by mouth 2 (two) times daily.   lactulose  10 GM/15ML solution Commonly known as: CHRONULAC  Take 30 mLs (20 g total) by mouth 2 (two) times daily.   nicotine  21 mg/24hr patch Commonly known as: NICODERM CQ  - dosed in mg/24 hours Place 1 patch (21 mg total) onto the skin daily.   nitroGLYCERIN  0.4 MG SL tablet Commonly known as: NITROSTAT  Place 1 tablet (0.4 mg total) under the tongue every 5 (five) minutes x 3 doses as needed for chest pain.   pantoprazole  40 MG tablet Commonly known as: PROTONIX  Take 1 tablet (40 mg total) by mouth daily.   spironolactone 25 MG tablet Commonly known as: Aldactone Take 1 tablet (25 mg total) by mouth daily.   tamsulosin  0.4 MG Caps capsule Commonly known as: FLOMAX  Take 1 capsule (0.4 mg total) by mouth daily.   thiamine  100 MG tablet Commonly known as: VITAMIN B1 Take 1 tablet (100 mg total) by mouth daily.   Vitamin D  (Ergocalciferol ) 1.25 MG (50000 UNIT) Caps capsule Commonly known as: DRISDOL  X 24 weeks What changed:  how much to take how to take this when to take  this additional instructions         Contact information for follow-up providers     [name], NP. Schedule an appointment as soon as possible for a visit in 1 week(s).   Specialties: Nurse Practitioner, Family Medicine Contact information: [address] [place] [address] [phone]         Elicia Claw, MD. Schedule an appointment as soon as possible for a visit in 1 week(s).   Specialty: Gastroenterology Contact information: [address] [place] [address] [phone]              Contact information for after-discharge care     Destination     [facility] .   Service: Skilled Nursing Contact information: [address] [place]  [address] [phone]                     Major procedures and Radiology Reports - PLEASE review detailed and final reports thoroughly  -     DG Chest [facility] 1 View Result Date: 01/02/2024 CLINICAL DATA:  Shortness of breath, lethargy. EXAM: PORTABLE CHEST 1 VIEW COMPARISON:  12/30/2023 FINDINGS: Stable cardiomediastinal contours. No significant pleural effusion or interstitial edema. Patchy opacities within the right mid and right lower lung compatible with atelectasis or airspace disease. The appearance is unchanged from 12/30/2023. Left lung is clear. IMPRESSION: Patchy opacities within the right mid and right lower lung compatible with atelectasis or airspace disease. Appearance is unchanged from 12/30/2023. Electronically Signed   By: Waddell Calk M.D.   On: 01/02/2024 06:20   DG Chest Port 1 View Result Date: 12/30/2023 CLINICAL DATA:  58 year old male with shortness of breath. EXAM: PORTABLE CHEST 1 VIEW COMPARISON:  Portable chest yesterday and earlier. FINDINGS: Portable AP semi upright view at 0655 hours. Continued low lung volumes. Normal cardiac size and mediastinal contours. Visualized tracheal air column  is within normal limits. No pneumothorax, pleural  effusion, consolidation, or pulmonary edema. Mild lung base atelectasis, more apparent on the right. No acute osseous abnormality identified. Negative visible bowel gas. IMPRESSION: Low lung volumes with right greater than left lung base atelectasis. Electronically Signed   By: VEAR Hurst M.D.   On: 12/30/2023 07:13   DG Abd Portable 1V Result Date: 12/29/2023 CLINICAL DATA:  Nausea EXAM: PORTABLE ABDOMEN - 1 VIEW COMPARISON:  None Available. FINDINGS: The bowel gas pattern is normal. No radio-opaque calculi or other significant radiographic abnormality are seen. IMPRESSION: Negative. Electronically Signed   By: Franky Chard M.D.   On: 12/29/2023 11:39   US  RENAL Result Date: 12/29/2023 CLINICAL DATA:  Acute kidney injury. EXAM: RENAL / URINARY TRACT ULTRASOUND COMPLETE COMPARISON:  04/02/2023 FINDINGS: Right Kidney: Renal measurements: 10.4 x 5.0 x 6.8 cm = volume: 184 mL. Echogenicity within normal limits. No mass or hydronephrosis visualized. Left Kidney: Renal measurements: 11.0 x 6.1 x 5.9 cm = volume: 208 mL. Echogenicity within normal limits. No mass or hydronephrosis visualized. Bladder: Decompressed. Other: Moderate volume ascites evident. IMPRESSION: 1. No evidence for hydronephrosis. 2. Moderate volume ascites. Electronically Signed   By: Camellia Candle M.D.   On: 12/29/2023 08:39   DG Chest Port 1 View Result Date: 12/29/2023 CLINICAL DATA:  Shortness of breath. History of heart disease status post stenting. EXAM: PORTABLE CHEST 1 VIEW COMPARISON:  Radiographs 04/02/2023 and 09/10/2022. FINDINGS: 0806 hours. Lordotic positioning with lower lung volumes. The heart size and mediastinal contours are normal. The lungs are clear. There is no pleural effusion or pneumothorax. No acute osseous findings are identified. Telemetry leads overlie the chest. IMPRESSION: No evidence of acute cardiopulmonary process. Electronically Signed   By: Elsie Perone M.D.   On: 12/29/2023 08:30   ECHOCARDIOGRAM  COMPLETE Result Date: 12/27/2023    ECHOCARDIOGRAM REPORT   Patient Name:   Jeffrey Hodges Date of Exam: 12/27/2023 Medical Rec #:  [id number]     Height:       72.0 in Accession #:    [id number]    Weight:       185.6 lb Date of Birth:  04-25-1966    BSA:          2.064 m Patient Age:    57 years      BP:           138/84 mmHg Patient Gender: M             HR:           99 bpm. Exam Location:  Inpatient Procedure: 2D Echo, Cardiac Doppler and Color Doppler (Both Spectral and Color            Flow Doppler were utilized during procedure). Indications:    Acute ischemic heart disease, unspecified I24.9  History:        Patient has prior history of Echocardiogram examinations, most                 recent 04/03/2023. CAD; Risk Factors:Hypertension, Dyslipidemia                 and Former Smoker.  Sonographer:    Koleen Popper RDCS Referring Phys: JACQUELINE LAVADA POUR [facility]  Sonographer Comments: Suboptimal parasternal window. Image acquisition challenging due to uncooperative patient. IMPRESSIONS  1. Left ventricular ejection fraction, by estimation, is 55 to 60%. The left ventricle has normal function. The left ventricle has no regional wall motion abnormalities. There  is mild left ventricular hypertrophy. Left ventricular diastolic parameters were normal.  2. Right ventricular systolic function is normal. The right ventricular size is normal. Tricuspid regurgitation signal is inadequate for assessing PA pressure.  3. The mitral valve is grossly normal. Trivial mitral valve regurgitation. No evidence of mitral stenosis.  4. The aortic valve is grossly normal. Aortic valve regurgitation is not visualized. No aortic stenosis is present.  5. The inferior vena cava is normal in size with <50% respiratory variability, suggesting right atrial pressure of 8 mmHg. FINDINGS  Left Ventricle: Left ventricular ejection fraction, by estimation, is 55 to 60%. The left ventricle has normal function. The left ventricle has no regional wall  motion abnormalities. The left ventricular internal cavity size was normal in size. There is  mild left ventricular hypertrophy. Left ventricular diastolic parameters were normal. Right Ventricle: The right ventricular size is normal. No increase in right ventricular wall thickness. Right ventricular systolic function is normal. Tricuspid regurgitation signal is inadequate for assessing PA pressure. Left Atrium: Left atrial size was normal in size. Right Atrium: Right atrial size was normal in size. Pericardium: There is no evidence of pericardial effusion. Mitral Valve: The mitral valve is grossly normal. Trivial mitral valve regurgitation. No evidence of mitral valve stenosis. Tricuspid Valve: The tricuspid valve is normal in structure. Tricuspid valve regurgitation is trivial. No evidence of tricuspid stenosis. Aortic Valve: The aortic valve is grossly normal. Aortic valve regurgitation is not visualized. No aortic stenosis is present. Pulmonic Valve: The pulmonic valve was normal in structure. Pulmonic valve regurgitation is not visualized. No evidence of pulmonic stenosis. Aorta: The aortic root is normal in size and structure. Venous: The inferior vena cava is normal in size with less than 50% respiratory variability, suggesting right atrial pressure of 8 mmHg. IAS/Shunts: No atrial level shunt detected by color flow Doppler.  LEFT VENTRICLE PLAX 2D LVIDd:         4.50 cm   Diastology LVIDs:         2.90 cm   LV e' medial:    8.05 cm/s LV PW:         1.10 cm   LV E/e' medial:  10.1 LV IVS:        0.90 cm   LV e' lateral:   12.30 cm/s LVOT diam:     1.80 cm   LV E/e' lateral: 6.6 LV SV:         56 LV SV Index:   27 LVOT Area:     2.54 cm  RIGHT VENTRICLE RV S prime:     18.95 cm/s LEFT ATRIUM             Index        RIGHT ATRIUM           Index LA diam:        3.70 cm 1.79 cm/m   RA Area:     13.60 cm LA Vol (A2C):   24.2 ml 11.72 ml/m  RA Volume:   28.90 ml  14.00 ml/m LA Vol (A4C):   43.8 ml 21.22 ml/m  LA Biplane Vol: 36.2 ml 17.54 ml/m  AORTIC VALVE LVOT Vmax:   115.00 cm/s LVOT Vmean:  78.600 cm/s LVOT VTI:    0.222 m  AORTA Ao Root diam: 3.30 cm MITRAL VALVE MV Area (PHT): 4.49 cm    SHUNTS MV Decel Time: 169 msec    Systemic VTI:  0.22 m MV E velocity: 81.20 cm/s  Systemic Diam: 1.80 cm MV A velocity: 93.30 cm/s MV E/A ratio:  0.87 Soyla Merck MD Electronically signed by Soyla Merck MD Signature Date/Time: 12/27/2023/4:02:19 PM    Final    MR BRAIN WO CONTRAST Result Date: 12/25/2023 CLINICAL DATA:  Initial evaluation for acute mental status change, unknown cause. EXAM: MRI HEAD WITHOUT CONTRAST TECHNIQUE: Multiplanar, multiecho pulse sequences of the brain and surrounding structures were obtained without intravenous contrast. COMPARISON:  CT from earlier the same day. FINDINGS: Brain: Examination moderately to severely degraded by motion artifact. Cerebral volume within normal limits. Patchy T2/FLAIR hyperintensity involving the periventricular and deep white matter both cerebral hemispheres, most characteristic of chronic microvascular ischemic disease, mild in nature. No visible foci of restricted diffusion to suggest acute or subacute ischemia. No areas of chronic cortical infarction. No acute or chronic intracranial blood products. No mass lesion, midline shift or mass effect. No hydrocephalus or extra-axial fluid collection. Pituitary gland within normal limits. Vascular: Major intracranial vascular flow voids are maintained. Skull and upper cervical spine: Cranial junction within normal limits. Bone marrow signal intensity normal. No scalp soft tissue abnormality. Sinuses/Orbits: Postoperative changes noted about the left globe. Globes orbital soft tissues demonstrate no acute abnormality. Paranasal sinuses are largely clear. No significant mastoid effusion. Other: None. IMPRESSION: 1. Motion degraded exam. 2. No acute intracranial abnormality. 3. Mild chronic microvascular ischemic disease  for age. Electronically Signed   By: Morene Hoard M.D.   On: 12/25/2023 21:28   CT Head Wo Contrast Result Date: 12/25/2023 CLINICAL DATA:  Mental status change, unknown cause EXAM: CT HEAD WITHOUT CONTRAST TECHNIQUE: Contiguous axial images were obtained from the base of the skull through the vertex without intravenous contrast. RADIATION DOSE REDUCTION: This exam was performed according to the departmental dose-optimization program which includes automated exposure control, adjustment of the mA and/or kV according to patient size and/or use of iterative reconstruction technique. COMPARISON:  April 02, 2023 FINDINGS: Brain: Proportional prominence of the ventricles and sulci, consistent with diffuse cerebral parenchymal volume loss. The ventricles otherwise maintained midline position without midline shift. Gray-white differentiation is preserved without focal attenuation abnormality.No evidence of acute territorial infarction, extra-axial fluid collection, hemorrhage, or mass lesion. The basilar cisterns are patent without downward herniation. The cerebellar hemispheres and vermis are well formed without mass lesion or focal attenuation abnormality. Vascular: No hyperdense vessel. Skull: Normal. Negative for fracture or focal lesion. Sinuses/Orbits: Significant leftward deviation of the nasal septum. The paranasal sinuses and mastoids are clear.The globes appear intact. No retrobulbar hematoma.Postsurgical changes of the left globe with scleral band in place. Dislocated lens layering dependently. Other: None. IMPRESSION: No acute intracranial abnormality, specifically, no acute hemorrhage, territorial infarction, or intracranial mass.s Electronically Signed   By: Rogelia Myers M.D.   On: 12/25/2023 15:32    Micro Results    No results found for this or any previous visit (from the past 240 hours).  Today   Subjective    Jeffrey Hodges today has no headache,no chest abdominal pain,no new  weakness tingling or numbness, feels much better wants to go home today.  -Denies any complaints today, patient was found on the floor yesterday, he recalls events, reports he was confused, denies any head trauma.   Objective   Blood pressure 117/85, pulse 95, temperature 97.9 F (36.6 C), temperature source Axillary, resp. rate 19, height 6' (1.829 m), weight 91 kg, SpO2 98%.   Intake/Output Summary (Last 24 hours) at 01/07/2024 1035 Last data filed at 01/07/2024 0600 Gross per  24 hour  Intake 120 ml  Output 820 ml  Net -700 ml    Exam  Awake Alert, No new F.N deficits,    Pratt.AT,PERRAL Supple Neck,   Symmetrical Chest wall movement, Good air movement bilaterally, CTAB RRR,No Gallops,   +ve B.Sounds, Abd Soft, Non tender,  1+ edema    Data Review   Recent Labs  Lab 01/02/24 0744 01/03/24 0727 01/04/24 0649 01/05/24 0547 01/06/24 0540  WBC 5.5 5.4 5.6 7.4 6.8  HGB 11.4* 10.5* 10.3* 10.8* 10.4*  HCT 33.2* 30.7* 30.2* 32.2* 29.9*  PLT 164 197 220 231 209  MCV 100.9* 99.4 99.7 100.9* 99.0  MCH 34.7* 34.0 34.0 33.9 34.4*  MCHC 34.3 34.2 34.1 33.5 34.8  RDW 18.6* 18.5* 18.2* 18.1* 18.1*  LYMPHSABS 0.9 1.0 1.1 1.4 1.3  MONOABS 0.5 0.5 0.5 0.8 0.5  EOSABS 0.1 0.1 0.1 0.1 0.1  BASOSABS 0.0 0.0 0.1 0.1 0.0    Recent Labs  Lab 01/01/24 0415 01/02/24 0744 01/03/24 0727 01/04/24 0649 01/05/24 0547 01/06/24 0540  NA 130* 132* 133* 134* 132* 129*  K 3.0* 3.7 3.3* 3.2* 3.4* 3.7  CL 99 100 103 103 104 102  CO2 25 23 25 23 22 22   ANIONGAP 6 9 5 8 6 5   GLUCOSE 96 79 84 122* 93 91  BUN 8 8 8 7  5* 6  CREATININE 0.79 0.74 0.77 0.76 0.74 0.74  AST 65* 78* 63* 58* 56*  --   ALT 44 52* 44 40 39  --   ALKPHOS 91 104 97 94 94  --   BILITOT 1.0 1.0 0.7 0.8 0.7  --   ALBUMIN  <1.5* <1.5* <1.5* 1.9* 1.8*  --   MG 1.8 1.9 1.8 1.7  --  1.9  PHOS 3.0 2.6 2.9 2.6  --   --   CALCIUM  7.4* 7.8* 7.5* 7.8* 7.9* 7.5*      Signature  -    Brayton Lye M.D on 01/07/2024 at 10:35  AM   -  To page go to [email or web address]

## 2024-01-07 NOTE — TOC Transition Note (Signed)
 Transition of Care [facility]) - Discharge Note   Patient Details  Name: Jeffrey Hodges MRN: [id number] Date of Birth: 03-Jan-1966  Transition of Care [facility]) CM/SW Contact:  Inocente GORMAN Kindle, LCSW Phone Number: 01/07/2024, 12:07 PM   Clinical Narrative:    Patient will DC to: [facility] Anticipated DC date: 01/07/24 Family notified: Spouse Transport by: [facility]   Per MD patient ready for DC to [facility]. RN to call report prior to discharge [phone] room 122B). RN, patient, patient's family, and facility notified of DC. Discharge Summary and FL2 sent to facility. DC packet on chart. Ambulance transport requested for patient.   CSW will sign off for now as social work intervention is no longer needed. Please consult us  again if new needs arise.     Final next level of care: Skilled Nursing Facility Barriers to Discharge: Barriers Resolved   Patient Goals and CMS Choice Patient states their goals for this hospitalization and ongoing recovery are:: To get better again CMS Medicare.gov Compare Post Acute Care list provided to:: Patient Represenative (must comment) Choice offered to / list presented to : Spouse [place] ownership interest in [facility]provided to:: Spouse    Discharge Placement   Existing PASRR number confirmed : 01/07/24          Patient chooses bed at:  [facility]) Patient to be transferred to facility by: [facility] Name of family member notified: Spouse Patient and family notified of of transfer: 01/07/24  Discharge Plan and Services Additional resources added to the After Visit Summary for   In-house Referral: Clinical Social Work                                   Social Drivers of Health (SDOH) Interventions SDOH Screenings   Food Insecurity: No Food Insecurity (12/26/2023)  Housing: Unknown (12/26/2023)  Transportation Needs: No Transportation Needs (12/26/2023)  Utilities: Not At Risk (12/26/2023)  Depression (PHQ2-9):  High Risk (05/13/2023)  Financial Resource Strain: Medium Risk (03/24/2023)  Social Connections: Unknown (03/24/2023)  Stress: Stress Concern Present (03/24/2023)  Tobacco Use: Medium Risk (12/25/2023)     Readmission Risk Interventions     No data to display

## 2024-01-07 NOTE — TOC Progression Note (Addendum)
 Transition of Care [facility]) - Progression Note    Patient Details  Name: Jeffrey Hodges MRN: [id number] Date of Birth: 02/02/1966  Transition of Care [facility]) CM/SW Contact  Inocente GORMAN Kindle, LCSW Phone Number: 01/07/2024, 9:33 AM  Clinical Narrative:    9:33am-[facility] awaiting insurance approval.   11am-Insurance approval received. CSW updated patient's spouse who requested PTAR for transport.   Expected Discharge Plan: Skilled Nursing Facility Barriers to Discharge: Continued Medical Work up  Expected Discharge Plan and Services In-house Referral: Clinical Social Work     Living arrangements for the past 2 months: Single Family Home Expected Discharge Date: 01/06/24                                     Social Determinants of Health (SDOH) Interventions SDOH Screenings   Food Insecurity: No Food Insecurity (12/26/2023)  Housing: Unknown (12/26/2023)  Transportation Needs: No Transportation Needs (12/26/2023)  Utilities: Not At Risk (12/26/2023)  Depression (PHQ2-9): High Risk (05/13/2023)  Financial Resource Strain: Medium Risk (03/24/2023)  Social Connections: Unknown (03/24/2023)  Stress: Stress Concern Present (03/24/2023)  Tobacco Use: Medium Risk (12/25/2023)    Readmission Risk Interventions     No data to display

## 2024-01-07 NOTE — Plan of Care (Signed)
  Problem: Education: Goal: Knowledge of General Education information will improve Description: Including pain rating scale, medication(s)/side effects and non-pharmacologic comfort measures Outcome: Progressing   Problem: Health Behavior/Discharge Planning: Goal: Ability to manage health-related needs will improve Outcome: Progressing   Problem: Clinical Measurements: Goal: Ability to maintain clinical measurements within normal limits will improve Outcome: Progressing Goal: Will remain free from infection Outcome: Progressing Goal: Diagnostic test results will improve Outcome: Progressing Goal: Cardiovascular complication will be avoided Outcome: Progressing   Problem: Activity: Goal: Risk for activity intolerance will decrease Outcome: Progressing   Problem: Nutrition: Goal: Adequate nutrition will be maintained Outcome: Progressing   Problem: Coping: Goal: Level of anxiety will decrease Outcome: Progressing   Problem: Elimination: Goal: Will not experience complications related to bowel motility Outcome: Progressing Goal: Will not experience complications related to urinary retention Outcome: Progressing   Problem: Pain Managment: Goal: General experience of comfort will improve and/or be controlled Outcome: Progressing   Problem: Safety: Goal: Ability to remain free from injury will improve Outcome: Progressing   Problem: Skin Integrity: Goal: Risk for impaired skin integrity will decrease Outcome: Progressing   Problem: Safety: Goal: Non-violent Restraint(s) Outcome: Progressing

## 2024-01-08 ENCOUNTER — Ambulatory Visit: Admitting: Nurse Practitioner

## 2024-01-08 DIAGNOSIS — I2510 Atherosclerotic heart disease of native coronary artery without angina pectoris: Secondary | ICD-10-CM | POA: Diagnosis not present

## 2024-01-08 DIAGNOSIS — R5381 Other malaise: Secondary | ICD-10-CM | POA: Diagnosis not present

## 2024-01-08 DIAGNOSIS — E512 Wernicke's encephalopathy: Secondary | ICD-10-CM | POA: Diagnosis not present

## 2024-01-08 DIAGNOSIS — F1010 Alcohol abuse, uncomplicated: Secondary | ICD-10-CM | POA: Diagnosis not present

## 2024-01-09 DIAGNOSIS — I10 Essential (primary) hypertension: Secondary | ICD-10-CM | POA: Diagnosis not present

## 2024-01-09 DIAGNOSIS — D649 Anemia, unspecified: Secondary | ICD-10-CM | POA: Diagnosis not present

## 2024-01-09 DIAGNOSIS — R1313 Dysphagia, pharyngeal phase: Secondary | ICD-10-CM | POA: Diagnosis not present

## 2024-01-09 DIAGNOSIS — F411 Generalized anxiety disorder: Secondary | ICD-10-CM | POA: Diagnosis not present

## 2024-01-09 DIAGNOSIS — R7401 Elevation of levels of liver transaminase levels: Secondary | ICD-10-CM | POA: Diagnosis not present

## 2024-01-09 DIAGNOSIS — F331 Major depressive disorder, recurrent, moderate: Secondary | ICD-10-CM | POA: Diagnosis not present

## 2024-01-13 ENCOUNTER — Other Ambulatory Visit (HOSPITAL_COMMUNITY): Payer: Self-pay

## 2024-01-19 ENCOUNTER — Ambulatory Visit (INDEPENDENT_AMBULATORY_CARE_PROVIDER_SITE_OTHER): Admitting: Nurse Practitioner

## 2024-01-19 ENCOUNTER — Emergency Department (HOSPITAL_COMMUNITY)

## 2024-01-19 ENCOUNTER — Emergency Department (HOSPITAL_COMMUNITY)
Admission: EM | Admit: 2024-01-19 | Discharge: 2024-01-20 | Disposition: A | Discharge status: Home / Self Care | Attending: Emergency Medicine | Admitting: Emergency Medicine

## 2024-01-19 ENCOUNTER — Inpatient Hospital Stay: Admitting: Nurse Practitioner

## 2024-01-19 ENCOUNTER — Other Ambulatory Visit: Payer: Self-pay

## 2024-01-19 ENCOUNTER — Encounter (HOSPITAL_COMMUNITY): Payer: Self-pay

## 2024-01-19 VITALS — BP 158/118 | HR 100 | Temp 97.8°F | Ht 72.0 in | Wt 192.6 lb

## 2024-01-19 DIAGNOSIS — K7031 Alcoholic cirrhosis of liver with ascites: Secondary | ICD-10-CM | POA: Insufficient documentation

## 2024-01-19 DIAGNOSIS — Z09 Encounter for follow-up examination after completed treatment for conditions other than malignant neoplasm: Secondary | ICD-10-CM | POA: Diagnosis not present

## 2024-01-19 DIAGNOSIS — R1084 Generalized abdominal pain: Secondary | ICD-10-CM | POA: Diagnosis not present

## 2024-01-19 DIAGNOSIS — R932 Abnormal findings on diagnostic imaging of liver and biliary tract: Secondary | ICD-10-CM | POA: Diagnosis not present

## 2024-01-19 DIAGNOSIS — Z7982 Long term (current) use of aspirin: Secondary | ICD-10-CM | POA: Diagnosis not present

## 2024-01-19 DIAGNOSIS — R188 Other ascites: Secondary | ICD-10-CM | POA: Insufficient documentation

## 2024-01-19 DIAGNOSIS — R079 Chest pain, unspecified: Secondary | ICD-10-CM | POA: Insufficient documentation

## 2024-01-19 DIAGNOSIS — R4182 Altered mental status, unspecified: Secondary | ICD-10-CM | POA: Insufficient documentation

## 2024-01-19 DIAGNOSIS — F17200 Nicotine dependence, unspecified, uncomplicated: Secondary | ICD-10-CM | POA: Diagnosis not present

## 2024-01-19 DIAGNOSIS — K7460 Unspecified cirrhosis of liver: Secondary | ICD-10-CM | POA: Diagnosis not present

## 2024-01-19 DIAGNOSIS — K449 Diaphragmatic hernia without obstruction or gangrene: Secondary | ICD-10-CM | POA: Diagnosis not present

## 2024-01-19 DIAGNOSIS — I10 Essential (primary) hypertension: Secondary | ICD-10-CM | POA: Insufficient documentation

## 2024-01-19 LAB — URINALYSIS, ROUTINE W REFLEX MICROSCOPIC
Bacteria, UA: NONE SEEN
Bilirubin Urine: NEGATIVE
Glucose, UA: NEGATIVE mg/dL
Hgb urine dipstick: NEGATIVE
Ketones, ur: NEGATIVE mg/dL
Leukocytes,Ua: NEGATIVE
Nitrite: NEGATIVE
Protein, ur: 30 mg/dL — AB
Specific Gravity, Urine: >1.046 — ABNORMAL HIGH (ref 1.005–1.030)
pH: 6 (ref 5.0–8.0)

## 2024-01-19 LAB — LIPASE, BLOOD: Lipase: 25 U/L (ref 11–51)

## 2024-01-19 LAB — CBC
HCT: 37.3 % — ABNORMAL LOW (ref 39.0–52.0)
Hemoglobin: 12.6 g/dL — ABNORMAL LOW (ref 13.0–17.0)
MCH: 33.1 pg (ref 26.0–34.0)
MCHC: 33.8 g/dL (ref 30.0–36.0)
MCV: 97.9 fL (ref 80.0–100.0)
Platelets: 482 K/uL — ABNORMAL HIGH (ref 150–400)
RBC: 3.81 MIL/uL — ABNORMAL LOW (ref 4.22–5.81)
RDW: 16.6 % — ABNORMAL HIGH (ref 11.5–15.5)
WBC: 9.6 K/uL (ref 4.0–10.5)
nRBC: 0 % (ref 0.0–0.2)

## 2024-01-19 LAB — COMPREHENSIVE METABOLIC PANEL WITH GFR
ALT: 28 U/L (ref 0–44)
AST: 62 U/L — ABNORMAL HIGH (ref 15–41)
Albumin: 1.9 g/dL — ABNORMAL LOW (ref 3.5–5.0)
Alkaline Phosphatase: 141 U/L — ABNORMAL HIGH (ref 38–126)
Anion gap: 13 (ref 5–15)
BUN: <5 mg/dL — ABNORMAL LOW (ref 6–20)
CO2: 26 mmol/L (ref 22–32)
Calcium: 8.2 mg/dL — ABNORMAL LOW (ref 8.9–10.3)
Chloride: 99 mmol/L (ref 98–111)
Creatinine, Ser: 0.94 mg/dL (ref 0.61–1.24)
GFR, Estimated: >60 mL/min (ref >60)
Glucose, Bld: 135 mg/dL — ABNORMAL HIGH (ref 70–99)
Potassium: 2.8 mmol/L — ABNORMAL LOW (ref 3.5–5.1)
Sodium: 138 mmol/L (ref 135–145)
Total Bilirubin: 1.3 mg/dL — ABNORMAL HIGH (ref 0.0–1.2)
Total Protein: 7.5 g/dL (ref 6.5–8.1)

## 2024-01-19 LAB — AMMONIA: Ammonia: 14 umol/L (ref 9–35)

## 2024-01-19 LAB — BRAIN NATRIURETIC PEPTIDE: B Natriuretic Peptide: 53 pg/mL (ref 0.0–100.0)

## 2024-01-19 MED ORDER — FENTANYL CITRATE PF 50 MCG/ML IJ SOSY
50.0000 ug | PREFILLED_SYRINGE | Freq: Once | INTRAMUSCULAR | Status: AC
  Administered 2024-01-19: 50 ug via INTRAVENOUS
  Filled 2024-01-19: qty 1

## 2024-01-19 MED ORDER — POTASSIUM CHLORIDE CRYS ER 20 MEQ PO TBCR
40.0000 meq | EXTENDED_RELEASE_TABLET | Freq: Once | ORAL | Status: AC
  Administered 2024-01-20: 40 meq via ORAL
  Filled 2024-01-19: qty 2

## 2024-01-19 MED ORDER — OXYCODONE-ACETAMINOPHEN 5-325 MG PO TABS
1.0000 | ORAL_TABLET | Freq: Once | ORAL | Status: AC
  Administered 2024-01-19: 1 via ORAL
  Filled 2024-01-19: qty 1

## 2024-01-19 MED ORDER — MAGNESIUM SULFATE 2 GM/50ML IV SOLN
2.0000 g | Freq: Once | INTRAVENOUS | Status: AC
  Administered 2024-01-20: 2 g via INTRAVENOUS
  Filled 2024-01-19: qty 50

## 2024-01-19 MED ORDER — IOHEXOL 350 MG/ML SOLN
75.0000 mL | Freq: Once | INTRAVENOUS | Status: AC | PRN
  Administered 2024-01-19: 75 mL via INTRAVENOUS

## 2024-01-19 MED ORDER — THIAMINE MONONITRATE 100 MG PO TABS
100.0000 mg | ORAL_TABLET | Freq: Once | ORAL | Status: AC
  Administered 2024-01-20: 100 mg via ORAL
  Filled 2024-01-19: qty 1

## 2024-01-19 NOTE — ED Notes (Signed)
 Patient unable to provide urine sample. NT to bladder scan patient.

## 2024-01-19 NOTE — ED Provider Notes (Signed)
 11:53 PM Assumed care from Dr. Charlyn, please see their note for full history, physical and decision making until this point. In brief this is a 58 y.o. year old male who presented to the ED tonight with Abdominal Pain and AMS     Pending K/Mg repletion and paracentesis results for likely discharge.   Gram stain is.  Family asked if they could the lactic.  His ammonia today is normal and he has not had it in a week so I told him they could do it as needed for altered mental status.  Will follow-up with his GI doctor for further workup and management and repeat paracentesis as needed.  Discharge instructions, including strict return precautions for new or worsening symptoms, given. Patient and/or family verbalized understanding and agreement with the plan as described.   Labs, studies and imaging reviewed by myself and considered in medical decision making if ordered. Imaging interpreted by radiology.  Labs Reviewed  CBC - Abnormal; Notable for the following components:      Result Value   RBC 3.81 (*)    Hemoglobin 12.6 (*)    HCT 37.3 (*)    RDW 16.6 (*)    Platelets 482 (*)    All other components within normal limits  COMPREHENSIVE METABOLIC PANEL WITH GFR - Abnormal; Notable for the following components:   Potassium 2.8 (*)    Glucose, Bld 135 (*)    BUN <5 (*)    Calcium  8.2 (*)    Albumin  1.9 (*)    AST 62 (*)    Alkaline Phosphatase 141 (*)    Total Bilirubin 1.3 (*)    All other components within normal limits  URINALYSIS, ROUTINE W REFLEX MICROSCOPIC - Abnormal; Notable for the following components:   Color, Urine AMBER (*)    Specific Gravity, Urine >1.046 (*)    Protein, ur 30 (*)    All other components within normal limits  BODY FLUID CULTURE W GRAM STAIN  AMMONIA  LIPASE, BLOOD  BRAIN NATRIURETIC PEPTIDE  LACTATE DEHYDROGENASE, PLEURAL OR PERITONEAL FLUID  GLUCOSE, PLEURAL OR PERITONEAL FLUID  PROTEIN, PLEURAL OR PERITONEAL FLUID  ALBUMIN , PLEURAL OR  PERITONEAL FLUID   BODY FLUID CELL COUNT WITH DIFFERENTIAL    CT ABDOMEN PELVIS W CONTRAST  Final Result  Addendum (preliminary) 1 of 1  ADDENDUM REPORT: 01/19/2024 18:12    ADDENDUM:  The portal, splenic, superior mesenteric veins are patent.      Electronically Signed    By: Morgane  Naveau M.D.    On: 01/19/2024 18:12      Final      No follow-ups on file.    Radiah Lubinski, Selinda, MD 01/20/24 [phone]

## 2024-01-19 NOTE — Assessment & Plan Note (Signed)
 Did review ED note along with post discharge summaries for inpatient.  Did review basic labs and all imaging.

## 2024-01-19 NOTE — ED Notes (Signed)
 Paracentesis kit ordered from supply. estimated until kit is up here

## 2024-01-19 NOTE — ED Notes (Signed)
 Patient transported to CT

## 2024-01-19 NOTE — ED Notes (Signed)
 Post residual bladder scan resulted at 234 mls.

## 2024-01-19 NOTE — Progress Notes (Signed)
 Acute Office Visit  Subjective:     Patient ID: Jeffrey Hodges, male    DOB: [birth year], 58 y.o.   MRN: [id number]  Chief Complaint  Patient presents with   Hospitalization Follow-up    Pt complains of ongoing pain in legs, stomach area, and lower back. Pain level 10 today. States he's doing real bad with the pain.  Pt says he did not start having all these pains until after leaving ED.    Medication Refill    HPI Patient is in today for hospital follow-up  Patient went to the emergency department on 12/25/2023.  Patient was brought in via EMS for altered mental status per report he had been laying in the bed for the past 4 days.  Patient has several electrolyte abnormalities along with elevated LFTs and bilirubin.  Patient had CT scan of the head that was negative and was admitted to the hospital.  Patient was discharged on 01/06/2024 to a skilled nursing facility.  They request recheck of labs and chest x-ray.  Patient did have hyponatremia and fluid overload.  He was placed on Lasix  and Aldactone .  They did resume patient's Prozac  but held BuSpar .  Patient have bladder outlet obstruction and a Foley catheter was placed.  This was removed on 01/06/2024 patient was placed on Flomax  and needed to follow-up with urology.  Insurance authorization was obtained and patient was officially discharged on 01/07/2024. States that he went to a nursing facility. States they left on 01/11/2024 because of the affordabily. They did not refill the medications due to living earlier  He has been out of the medications for at least a week. States that he is not peeing well. States that he has not peed today.  He is pooping daily even without the lactulose .  States that it is balls and rocks  States that he is hurting in his stomach and low back for approx 2 days. States that it is staying the same. He decscribes it as hurts so freaking bad.  Statse that the last drink was proir to the ED visit Review of  Systems  Constitutional:  Negative for chills and fever.  Respiratory:  Positive for shortness of breath.   Cardiovascular:  Positive for chest pain.  Gastrointestinal:  Positive for abdominal pain, constipation and vomiting. Negative for diarrhea and nausea.  Neurological:  Negative for headaches.  Psychiatric/Behavioral:  Negative for hallucinations and suicidal ideas.         Objective:    BP (!) 158/118   Pulse 100   Temp 97.8 F (36.6 C) (Oral)   Ht 6' (1.829 m)   Wt 192 lb 9.6 oz (87.4 kg)   SpO2 98%   BMI 26.12 kg/m  BP Readings from Last 3 Encounters:  01/19/24 (!) 148/103  01/19/24 (!) 158/118  01/07/24 124/81   Wt Readings from Last 3 Encounters:  01/19/24 192 lb 9.6 oz (87.4 kg)  01/07/24 200 lb 9.9 oz (91 kg)  10/20/23 199 lb (90.3 kg)   SpO2 Readings from Last 3 Encounters:  01/19/24 99%  01/19/24 98%  01/07/24 95%      Physical Exam Vitals and nursing note reviewed.  Constitutional:      Appearance: Normal appearance.  HENT:     Mouth/Throat:     Mouth: Mucous membranes are moist.     Pharynx: Oropharynx is clear.  Eyes:     Extraocular Movements: Extraocular movements intact.     Pupils: Pupils are equal, round, and  reactive to light.  Cardiovascular:     Rate and Rhythm: Normal rate and regular rhythm.     Heart sounds: Normal heart sounds.  Pulmonary:     Effort: Pulmonary effort is normal.     Breath sounds: Normal breath sounds.  Abdominal:     General: Bowel sounds are normal. There is distension.     Palpations: There is mass.     Tenderness: There is no abdominal tenderness.     Comments: Firm  Musculoskeletal:     Right lower leg: Edema present.     Left lower leg: Edema present.  Neurological:     Mental Status: He is alert. He is disoriented.     Comments: Bilateral upper and lower extremity strength 5/5     No results found for any visits on 01/19/24.      Assessment & Plan:   Problem List Items Addressed This Visit        Cardiovascular and Mediastinum   Primary hypertension   Patient was placed on furosemide  40 mg twice daily along with spironolactone  25 mg daily.  Patient has been out of medication for approximately 1 week.  Hypertensive in office.  With chest pain EKG within normal limits sent patient to the emergency department        Other   Hospital discharge follow-up - Primary   Did review ED note along with post discharge summaries for inpatient.  Did review basic labs and all imaging.      Other ascites   Patient's abdomen was distended and tight.  Did review ultrasound which showed moderate ascites at that juncture do feel like patient is in fluid overload with that and lower extremity pitting edema.  Sent patient to emergency department for evaluation and further management with for possible paracentesis and diuresis      Chest pain   EKG within normal limits likely secondary to pressure and volume overload.      Relevant Orders   EKG 12-Lead   Altered mental status   Concern for hepatic encephalopathy as patient has been off lactulose  for a week with incomplete bowel movements per description.  Patient was sent to the emergency department for further evaluation       No orders of the defined types were placed in this encounter.   No follow-ups on file.  Adina Crandall, NP

## 2024-01-19 NOTE — Assessment & Plan Note (Signed)
 EKG within normal limits likely secondary to pressure and volume overload.

## 2024-01-19 NOTE — Assessment & Plan Note (Signed)
 Concern for hepatic encephalopathy as patient has been off lactulose  for a week with incomplete bowel movements per description.  Patient was sent to the emergency department for further evaluation

## 2024-01-19 NOTE — ED Notes (Signed)
 Dr.Nanavati at bedside to do paracentesis

## 2024-01-19 NOTE — Assessment & Plan Note (Signed)
 Patient was placed on furosemide  40 mg twice daily along with spironolactone  25 mg daily.  Patient has been out of medication for approximately 1 week.  Hypertensive in office.  With chest pain EKG within normal limits sent patient to the emergency department

## 2024-01-19 NOTE — ED Notes (Signed)
 Patient encouraged to provide urine sample.

## 2024-01-19 NOTE — ED Provider Notes (Signed)
  EMERGENCY DEPARTMENT AT [facility] Provider Note   CSN: [id number] Arrival date & time: 01/19/24  1227     Patient presents with: Abdominal Pain and AMS   Jeffrey Hodges is a 58 y.o. male.   HPI     58 y.o. male with past medical history of heart disease status post stent, alcohol abuse with recent admission for encephalopathy thought to be because of possibly Wernicke's, anemia, AKI, elevated LFTs -presumed to have liver cirrhosis, hyperlipidemia, tobacco abuse, essential hypertension comes in with chief complaint of abdominal pain.  Patient accompanied by his spouse, who provides significant history as well.  According to the patient and his spouse, they were admitted to the hospital recently and they went to rehab facility.  They could not afford the rehab, therefore they signed out AMA on June 29.  Patient subsequently has not had any meds.  Patient was seen by PCP today as a follow-up from the appointment and advised to come to the ER for paracentesis.  Patient states that since discharge, has been having some abdominal pain.  Pain is generalized.  He has never had paracentesis before.  Patient denies any bloody stool, nausea, vomiting, diarrhea.  No history of abdominal surgeries.  Prior to Admission medications   Medication Sig Start Date End Date Taking? Authorizing Provider  aspirin  EC 81 MG tablet Take 1 tablet (81 mg total) by mouth daily. Swallow whole. 09/20/22   Daneen Damien BROCKS, NP  atorvastatin  (LIPITOR) 80 MG tablet Take 1 tablet (80 mg total) by mouth daily. 09/20/22   Daneen Damien BROCKS, NP  busPIRone  (BUSPAR ) 10 MG tablet Take 1 tablet (10 mg total) by mouth 2 (two) times daily. 01/21/24   [name], NP  cyanocobalamin  (VITAMIN B12) 1000 MCG tablet Take 1 tablet (1,000 mcg total) by mouth daily. 10/27/23   Stacia Glendia BRAVO, MD  FLUoxetine  (PROZAC ) 40 MG capsule Take 1 capsule (40 mg total) by mouth daily. 01/21/24   [name], NP  folic acid   (FOLVITE ) 1 MG tablet Take 1 tablet (1 mg total) by mouth daily. 10/27/23   Stacia Glendia BRAVO, MD  furosemide  (LASIX ) 40 MG tablet Take 1 tablet (40 mg total) by mouth 2 (two) times daily. 01/20/24 01/19/25  [name], NP  lactulose  (CHRONULAC ) 10 GM/15ML solution Take 30 mLs (20 g total) by mouth 2 (two) times daily. 01/20/24 02/19/24  [name], NP  nicotine  (NICODERM CQ  - DOSED IN MG/24 HOURS) 21 mg/24hr patch Place 1 patch (21 mg total) onto the skin daily. 01/06/24   Singh, Prashant K, MD  nitroGLYCERIN  (NITROSTAT ) 0.4 MG SL tablet Place 1 tablet (0.4 mg total) under the tongue every 5 (five) minutes x 3 doses as needed for chest pain. 09/20/22   Daneen Damien BROCKS, NP  pantoprazole  (PROTONIX ) 40 MG tablet Take 1 tablet (40 mg total) by mouth daily. 01/06/24   Dennise Lavada POUR, MD  spironolactone  (ALDACTONE ) 25 MG tablet Take 1 tablet (25 mg total) by mouth daily. 01/20/24 02/19/24  [name], NP  tamsulosin  (FLOMAX ) 0.4 MG CAPS capsule Take 1 capsule (0.4 mg total) by mouth daily. 01/06/24   Singh, Prashant K, MD  thiamine  (VITAMIN B1) 100 MG tablet Take 1 tablet (100 mg total) by mouth daily. 01/06/24   Singh, Prashant K, MD  Vitamin D , Ergocalciferol , (DRISDOL ) 1.25 MG (50000 UNIT) CAPS capsule X 24 weeks Patient taking differently: Take 50,000 Units by mouth every 7 (seven) days.  10/27/23   Stacia Glendia BRAVO, MD    Allergies: Patient has no known allergies.    Review of Systems  All other systems reviewed and are negative.   Updated Vital Signs BP (!) 139/101 (BP Location: Right Arm)   Pulse (!) 102   Temp 97.7 F (36.5 C) (Oral)   Resp 18   SpO2 98%   Physical Exam Vitals and nursing note reviewed.  Constitutional:      Appearance: He is well-developed.  HENT:     Head: Atraumatic.  Cardiovascular:     Rate and Rhythm: Normal rate.  Pulmonary:     Effort: Pulmonary effort is normal.  Abdominal:     General: There is distension.     Tenderness: There is generalized  abdominal tenderness. There is guarding.  Musculoskeletal:     Cervical back: Neck supple.  Skin:    General: Skin is warm.  Neurological:     Mental Status: He is alert and oriented to person, place, and time.     (all labs ordered are listed, but only abnormal results are displayed) Labs Reviewed  CBC - Abnormal; Notable for the following components:      Result Value   RBC 3.81 (*)    Hemoglobin 12.6 (*)    HCT 37.3 (*)    RDW 16.6 (*)    Platelets 482 (*)    All other components within normal limits  COMPREHENSIVE METABOLIC PANEL WITH GFR - Abnormal; Notable for the following components:   Potassium 2.8 (*)    Glucose, Bld 135 (*)    BUN <5 (*)    Calcium  8.2 (*)    Albumin  1.9 (*)    AST 62 (*)    Alkaline Phosphatase 141 (*)    Total Bilirubin 1.3 (*)    All other components within normal limits  URINALYSIS, ROUTINE W REFLEX MICROSCOPIC - Abnormal; Notable for the following components:   Color, Urine AMBER (*)    Specific Gravity, Urine >1.046 (*)    Protein, ur 30 (*)    All other components within normal limits  LACTATE DEHYDROGENASE, PLEURAL OR PERITONEAL FLUID - Abnormal; Notable for the following components:   LD, Fluid 91 (*)    All other components within normal limits  BODY FLUID CELL COUNT WITH DIFFERENTIAL - Abnormal; Notable for the following components:   Appearance, Fluid CLEAR (*)    Monocyte-Macrophage-Serous Fluid 16 (*)    All other components within normal limits  BODY FLUID CULTURE W GRAM STAIN  AMMONIA  LIPASE, BLOOD  BRAIN NATRIURETIC PEPTIDE  GLUCOSE, PLEURAL OR PERITONEAL FLUID  PROTEIN, PLEURAL OR PERITONEAL FLUID  ALBUMIN , PLEURAL OR PERITONEAL FLUID   PATHOLOGIST SMEAR REVIEW    EKG: None  Radiology: No results found.   ABDOMINAL PARACENTESIS  Date/Time: 01/19/2024 11:56 PM  Performed by: Charlyn Sora, MD Authorized by: Charlyn Sora, MD  Consent: Verbal consent obtained Risks and benefits: risks, benefits and  alternatives were discussed Consent given by: patient and spouse Patient understanding: patient states understanding of the procedure being performed Imaging studies: imaging studies available Required items: required blood products, implants, devices, and special equipment available Patient identity confirmed: arm band Time out: Immediately prior to procedure a time out was called to verify the correct patient, procedure, equipment, support staff and site/side marked as required. Preparation: Patient was prepped and draped in the usual sterile fashion. Local anesthesia used: yes Anesthesia: local infiltration  Anesthesia: Local anesthesia used: yes Local Anesthetic: lidocaine  1% with  epinephrine Anesthetic total: 5 mL  Sedation: Patient sedated: no  Patient tolerance: patient tolerated the procedure well with no immediate complications Comments: 4 L drained      Medications Ordered in the ED  oxyCODONE -acetaminophen  (PERCOCET/ROXICET) 5-325 MG per tablet 1 tablet (1 tablet Oral Given 01/19/24 1408)  fentaNYL  (SUBLIMAZE ) injection 50 mcg (50 mcg Intravenous Given 01/19/24 1557)  iohexol  (OMNIPAQUE ) 350 MG/ML injection 75 mL (75 mLs Intravenous Contrast Given 01/19/24 1707)  fentaNYL  (SUBLIMAZE ) injection 50 mcg (50 mcg Intravenous Given 01/19/24 2138)  magnesium  sulfate IVPB 2 g 50 mL (0 g Intravenous Stopped 01/20/24 0203)  potassium chloride  SA (KLOR-CON  M) CR tablet 40 mEq (40 mEq Oral Given 01/20/24 0057)  thiamine  (VITAMIN B1) tablet 100 mg (100 mg Oral Given 01/20/24 0057)                                    Medical Decision Making Amount and/or Complexity of Data Reviewed Labs: ordered.  Risk OTC drugs. Prescription drug management.   This patient presents to the ED with chief complaint(s) of abd pain with pertinent past medical history of alcoholic liver disease, recent admission for encephalopathy thought to be alcohol related as well, CAD, hypertension.patient has  distended abdomen with generalized tenderness.  No history of paracentesis.  He has been told that he has ascites.  Saw his PCP today and they advised that he come to the ER, with recommendation for paracentesis the complaint involves an extensive differential diagnosis and also carries with it a high risk of complications and morbidity.    The differential diagnosis includes : Pancreatitis, intra-abdominal abscess, small bowel obstruction, spontaneous bacterial peritonitis, abdominal pain secondary to ascites,    The initial plan is to get basic labs.  We will get POC US .  He will need CT abdomen pelvis with contrast, as based on my exam although he has distention, he does not have profound distention that would make me think of patient needing emergency tap.  Patient has no hypoxia, respiratory distress, he would not have gotten emergency therapeutic tap based on this visit, but we will consider ultrasound and diagnostic tap.  If you see a large pocket well, we will consider therapeutic tap otherwise he should get outpatient scheduled visit for paracentesis for optimal use of resource.   Additional history obtained: Additional history obtained from spouse Records reviewed previous admission documents and previous GI notes  Independent labs interpretation:  The following labs were independently interpreted: CBC, robotic profile overall reassuring.  Alk phos is elevated compared to baseline, potassium is low.  Bilirubin is only slightly elevated at 1.3.  Ammonia is normal.  BNP is normal.  Independent visualization and interpretation of imaging: - I independently visualized the following imaging with scope of interpretation limited to determining acute life threatening conditions related to emergency care: CT abdomen pelvis with contrast, which revealed no evidence of perforation, clear ascites noted.  Treatment and Reassessment: Patient reassessed.  We discussed that we typically like to do  paracentesis in the outpatient setting unless it needs to be therapeutic emergently.  However in this situation, patient currently does not have GI follow-up and this is the first time ascites for him, and the PCP sent him to the ER specifically for paracentesis, therefore we will attempt to do the paracentesis in the ER.  I have requested the family and the patient to discuss ascites with the GI doctor.  I also discussed with him that the CT scan is showing some concerning changes over the liver, that the GI doctor needs to be aware of so that they can keep close eye/surveillance of the liver or send patient for biopsy.  [name] who has the POA has given consent to proceed with paracentesis.  Risk and benefits discussed.  At 1155, paracentesis was completed successfully and we drained 4 L. No complications noted.  Incoming team to follow-up on the results.  If the paracentesis results are negative then patient can be discharged.  Reassessment: Ultrasound was successfully completed.  Admission was considered for this patient, however we ended up discharging him after the paracentesis completed.  Final diagnoses:  Alcoholic cirrhosis of liver with ascites [facility])    ED Discharge Orders     None          Charlyn Sora, MD 01/23/24 [phone]

## 2024-01-19 NOTE — Assessment & Plan Note (Signed)
 Patient's abdomen was distended and tight.  Did review ultrasound which showed moderate ascites at that juncture do feel like patient is in fluid overload with that and lower extremity pitting edema.  Sent patient to emergency department for evaluation and further management with for possible paracentesis and diuresis

## 2024-01-19 NOTE — ED Provider Triage Note (Signed)
 Emergency Medicine Provider Triage Evaluation Note  Jeffrey Hodges , a 58 y.o. male  was evaluated in triage.  Pt complains of abdominal pain, swelling, history of cirrhosis, worsening ascites and reportedly sent by PCP due to need for having his bili tapped.  He reports 10/10 pain in abdomen and back.  Denies any fever, chills.  Increased confusion per wife..  Review of Systems  Positive: Abdominal distension Negative: Fever, chills  Physical Exam  BP (!) 148/103   Pulse (!) 107   Temp 97.7 F (36.5 C)   Resp 14   SpO2 99%  Gen:   Awake, no distress   Resp:  Normal effort  MSK:   Moves extremities without difficulty  Other:  Distended, protuberant abdomen is tight, ttp throughout, no redness or warmth  Medical Decision Making  Medically screening exam initiated at 1:38 PM.  Appropriate orders placed.  Jeffrey Hodges was informed that the remainder of the evaluation will be completed by another provider, this initial triage assessment does not replace that evaluation, and the importance of remaining in the ED until their evaluation is complete.  Workup initiated in triage    Jeffrey Hodges, [place] 01/19/24 1339

## 2024-01-19 NOTE — ED Triage Notes (Signed)
 Pt to er for some abd pain and swelling, states that he called his MD and was told to come to the er.  States that he has a hx of ascites and he is here to have his belly tapped.

## 2024-01-19 NOTE — ED Notes (Signed)
 MD notified of patient's urinary retention.

## 2024-01-19 NOTE — ED Notes (Signed)
 This RN and Almarie NT did an in and out cath on pt per Dr.Nanavati order to this RN. was collected. Dr.Nanavati made aware of result.

## 2024-01-20 ENCOUNTER — Encounter: Admitting: Gastroenterology

## 2024-01-20 ENCOUNTER — Encounter: Payer: Self-pay | Admitting: Nurse Practitioner

## 2024-01-20 ENCOUNTER — Telehealth: Payer: Self-pay | Admitting: Gastroenterology

## 2024-01-20 ENCOUNTER — Other Ambulatory Visit: Payer: Self-pay | Admitting: Nurse Practitioner

## 2024-01-20 ENCOUNTER — Telehealth: Payer: Self-pay | Admitting: Nurse Practitioner

## 2024-01-20 DIAGNOSIS — R932 Abnormal findings on diagnostic imaging of liver and biliary tract: Secondary | ICD-10-CM

## 2024-01-20 DIAGNOSIS — K709 Alcoholic liver disease, unspecified: Secondary | ICD-10-CM

## 2024-01-20 DIAGNOSIS — R188 Other ascites: Secondary | ICD-10-CM

## 2024-01-20 DIAGNOSIS — I10 Essential (primary) hypertension: Secondary | ICD-10-CM

## 2024-01-20 LAB — BODY FLUID CELL COUNT WITH DIFFERENTIAL
Eos, Fluid: 0 %
Lymphs, Fluid: 73 %
Monocyte-Macrophage-Serous Fluid: 16 % — ABNORMAL LOW (ref 50–90)
Neutrophil Count, Fluid: 11 % (ref 0–25)
Total Nucleated Cell Count, Fluid: 62 uL (ref 0–1000)

## 2024-01-20 LAB — GLUCOSE, PLEURAL OR PERITONEAL FLUID: Glucose, Fluid: 113 mg/dL

## 2024-01-20 LAB — ALBUMIN, PLEURAL OR PERITONEAL FLUID: Albumin, Fluid: <1.5 g/dL

## 2024-01-20 LAB — PROTEIN, PLEURAL OR PERITONEAL FLUID: Total protein, fluid: <3 g/dL

## 2024-01-20 LAB — PATHOLOGIST SMEAR REVIEW: Path Review: REACTIVE

## 2024-01-20 LAB — LACTATE DEHYDROGENASE, PLEURAL OR PERITONEAL FLUID: LD, Fluid: 91 U/L — ABNORMAL HIGH (ref 3–23)

## 2024-01-20 MED ORDER — FUROSEMIDE 40 MG PO TABS: 40.0000 mg | ORAL_TABLET | Freq: Two times a day (BID) | ORAL | 0 refills | Status: DC

## 2024-01-20 MED ORDER — LACTULOSE 10 GM/15ML PO SOLN: 20.0000 g | Freq: Two times a day (BID) | ORAL | 0 refills | Status: DC

## 2024-01-20 MED ORDER — SPIRONOLACTONE 25 MG PO TABS: 25.0000 mg | ORAL_TABLET | Freq: Every day | ORAL | 0 refills | Status: DC

## 2024-01-20 NOTE — Telephone Encounter (Signed)
 [name] calls to advise that patient was seen in the ER yesterday for abdominal pain and altered mental status. He required paracentesis of 4 Liters fluid. Was hospitalized just prior to this with encephalopathy/Wernickes, AKI and elevated LFT's. It is noted in most recent ER note that patient had not been taking any of his medications due to financial constraints.  Wife has multiple questions regarding patient's liver disease, prognosis, MELD score etc.   We discussed that full work up has not yet been completed with our office (no Endo/Colon, no elastography etc) and that these questions would best be answered in the office setting.  Patient also had a CT scan completed in ER and was told there were some worrisome findings on the liver but this was not explained in detail and wife says she does not understand, was just told to follow with GI.  Patient has been scheduled to see Elida Nyle Sharps, NP at next availability on 01/30/24 at 130 pm to further discuss questions/concerns.  Wife asks multiple times for [place]ia (clarified she is referring to Dr Stacia) to reach out with recommendations until office visit though I explained again, this conversation may be more detailed than what we can go over with her on the phone.  Please advise.

## 2024-01-20 NOTE — Telephone Encounter (Signed)
 Can we call the patient and let them know I have reviewed the ED visit. I saw they got the paracentesis and they drained 4 liters of fluid off him. I have sent in the medications that he is suppose to be on. I want to see them back in office in 1 week to recheck labs and see how he is doing

## 2024-01-20 NOTE — Telephone Encounter (Signed)
 Inbound call from patients wife stated he was at the ED yesterday and they had went ahead and did a CT scan, she's wanting to know if she can receive a call from nurse in regards to those results and also has questions in regards to his brain confusion and Meld score due to his liver problems. Please advise  Thank you

## 2024-01-20 NOTE — Discharge Instructions (Signed)
 You are seen in the ER for abdominal discomfort and fluid buildup. The fluid was drained in the ER. There were some concerning changes to the liver itself on the CT scan.  We recommend you follow-up with your GI doctor for discussion of the CT scan, the liver findings and also discussion on scheduled paracentesis for the fluid around the abdomen.

## 2024-01-20 NOTE — Telephone Encounter (Addendum)
 Left voicemail for patient to call the office back.     Contacted pt wife on [place].  Relayed information and scheduled pt with 1 week follow up to recheck labs and to see how he is doing.  Pt wife complains that he is not in a good mental state right now.

## 2024-01-21 ENCOUNTER — Other Ambulatory Visit: Payer: Self-pay | Admitting: Nurse Practitioner

## 2024-01-21 ENCOUNTER — Ambulatory Visit: Payer: Self-pay

## 2024-01-21 MED ORDER — BUSPIRONE HCL 10 MG PO TABS: 10.0000 mg | ORAL_TABLET | Freq: Two times a day (BID) | ORAL | 2 refills | Status: DC

## 2024-01-21 MED ORDER — FLUOXETINE HCL 40 MG PO CAPS: 40.0000 mg | ORAL_CAPSULE | Freq: Every day | ORAL | 0 refills | Status: DC

## 2024-01-21 NOTE — Telephone Encounter (Signed)
 Contacted pt wife [name] on [place].  Advised ED as best option.  Wife states that patient will refuse ED.  Gave [name] information for behavioral health urgent care.  She says he may decline that option as well. Informed deanna on going to magistrates office to get IVC for pt.  [name] verbalized understanding and has no more questions or concerns.

## 2024-01-21 NOTE — Telephone Encounter (Signed)
 Patient has been scheduled for MRI liver at [facility] on Friday, 01/23/24 at 12 pm, 1130 am arrival. He should be NPO 4 hours prior to his test.   I have spoken to [facility], patient's wife to advise her of appointment time/date/location/prep. She verbalizes understanding.

## 2024-01-21 NOTE — Telephone Encounter (Signed)
 I have refilled both the prozac  and buspar . I already refilled the lactulose , lasix , and spironolactone .  If all else fails the ED is the best option, especially if there is danger to himself
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
## 2024-01-23 ENCOUNTER — Ambulatory Visit (HOSPITAL_COMMUNITY)

## 2024-01-23 ENCOUNTER — Other Ambulatory Visit: Payer: Self-pay

## 2024-01-23 ENCOUNTER — Emergency Department (HOSPITAL_COMMUNITY)

## 2024-01-23 ENCOUNTER — Emergency Department (HOSPITAL_COMMUNITY)
Admission: EM | Admit: 2024-01-23 | Discharge: 2024-01-23 | Disposition: A | Discharge status: Against Medical Advice | Attending: Emergency Medicine | Admitting: Emergency Medicine

## 2024-01-23 DIAGNOSIS — R4182 Altered mental status, unspecified: Secondary | ICD-10-CM | POA: Insufficient documentation

## 2024-01-23 DIAGNOSIS — E860 Dehydration: Secondary | ICD-10-CM | POA: Diagnosis not present

## 2024-01-23 DIAGNOSIS — Z7982 Long term (current) use of aspirin: Secondary | ICD-10-CM | POA: Diagnosis not present

## 2024-01-23 DIAGNOSIS — E876 Hypokalemia: Secondary | ICD-10-CM | POA: Insufficient documentation

## 2024-01-23 DIAGNOSIS — R413 Other amnesia: Secondary | ICD-10-CM | POA: Diagnosis not present

## 2024-01-23 DIAGNOSIS — I6523 Occlusion and stenosis of bilateral carotid arteries: Secondary | ICD-10-CM | POA: Diagnosis not present

## 2024-01-23 LAB — CBC
HCT: 36.6 % — ABNORMAL LOW (ref 39.0–52.0)
Hemoglobin: 12.6 g/dL — ABNORMAL LOW (ref 13.0–17.0)
MCH: 32.1 pg (ref 26.0–34.0)
MCHC: 34.4 g/dL (ref 30.0–36.0)
MCV: 93.1 fL (ref 80.0–100.0)
Platelets: 362 K/uL (ref 150–400)
RBC: 3.93 MIL/uL — ABNORMAL LOW (ref 4.22–5.81)
RDW: 15.7 % — ABNORMAL HIGH (ref 11.5–15.5)
WBC: 9.7 K/uL (ref 4.0–10.5)
nRBC: 0 % (ref 0.0–0.2)

## 2024-01-23 LAB — BODY FLUID CULTURE W GRAM STAIN: Culture: NO GROWTH

## 2024-01-23 LAB — COMPREHENSIVE METABOLIC PANEL WITH GFR
ALT: 32 U/L (ref 0–44)
AST: 74 U/L — ABNORMAL HIGH (ref 15–41)
Albumin: 1.9 g/dL — ABNORMAL LOW (ref 3.5–5.0)
Alkaline Phosphatase: 160 U/L — ABNORMAL HIGH (ref 38–126)
Anion gap: 16 — ABNORMAL HIGH (ref 5–15)
BUN: <5 mg/dL — ABNORMAL LOW (ref 6–20)
CO2: 24 mmol/L (ref 22–32)
Calcium: 8 mg/dL — ABNORMAL LOW (ref 8.9–10.3)
Chloride: 92 mmol/L — ABNORMAL LOW (ref 98–111)
Creatinine, Ser: 1.32 mg/dL — ABNORMAL HIGH (ref 0.61–1.24)
GFR, Estimated: >60 mL/min (ref >60)
Glucose, Bld: 110 mg/dL — ABNORMAL HIGH (ref 70–99)
Potassium: 2.4 mmol/L — CL (ref 3.5–5.1)
Sodium: 132 mmol/L — ABNORMAL LOW (ref 135–145)
Total Bilirubin: 2.1 mg/dL — ABNORMAL HIGH (ref 0.0–1.2)
Total Protein: 7.2 g/dL (ref 6.5–8.1)

## 2024-01-23 LAB — ETHANOL: Alcohol, Ethyl (B): <15 mg/dL (ref <15)

## 2024-01-23 LAB — AMMONIA: Ammonia: 16 umol/L (ref 9–35)

## 2024-01-23 MED ORDER — POTASSIUM CHLORIDE 10 MEQ/100ML IV SOLN
10.0000 meq | Freq: Once | INTRAVENOUS | Status: AC
  Administered 2024-01-23: 10 meq via INTRAVENOUS
  Filled 2024-01-23: qty 100

## 2024-01-23 MED ORDER — POTASSIUM CHLORIDE CRYS ER 20 MEQ PO TBCR
40.0000 meq | EXTENDED_RELEASE_TABLET | Freq: Once | ORAL | Status: AC
  Administered 2024-01-23: 40 meq via ORAL
  Filled 2024-01-23: qty 2

## 2024-01-23 MED ORDER — SODIUM CHLORIDE 0.9 % IV BOLUS
1000.0000 mL | Freq: Once | INTRAVENOUS | Status: AC
  Administered 2024-01-23: 1000 mL via INTRAVENOUS

## 2024-01-23 MED ORDER — POTASSIUM CHLORIDE ER 10 MEQ PO TBCR: 10.0000 meq | EXTENDED_RELEASE_TABLET | Freq: Every day | ORAL | 0 refills | Status: AC

## 2024-01-23 NOTE — ED Notes (Signed)
 Patient is resting comfortably.

## 2024-01-23 NOTE — ED Notes (Signed)
 By w/c to CT, sitter present. IVF/KCL infusing.

## 2024-01-23 NOTE — ED Notes (Signed)
 Sitting upright on side of bed, NAD, calm, cooperative, tracking. IVF/KCL infusing.

## 2024-01-23 NOTE — ED Notes (Signed)
Called safe transport  

## 2024-01-23 NOTE — Discharge Instructions (Signed)
 Follow-up with your doctor next week.  Return to the emergency department if you have any problems or you have change your mind and decided to stay in the hospital

## 2024-01-23 NOTE — ED Triage Notes (Signed)
 Pt arrives via sheriff's department for IVC. Per paperwork, pt has been AMS, drinking excessively despite cirrhosis dx, and has been urinating on the floor. Pt lives at home c wife. Pt is slow to answer, and is alertx3. Pt is covered in bruises of different healing stages, and some blood on shirt. States no etoh on board, no pain. Pt has +3 swelling on bilateral feet to which he answers 26 to how long that has been going on.

## 2024-01-23 NOTE — ED Notes (Signed)
 Provider notified of pt's K+ 2.4

## 2024-01-23 NOTE — Telephone Encounter (Signed)
 Attempted to reach patient's wife at number provided. No answer, unable to leave voicemail. Unfortunately, we cannot order an inpatient MRI to be done without gastroenterology services first having been consulted. She may reschedule MRI to a later date outpatient if it is not found by hospital inpatient team to be appropriate at this time.
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
## 2024-01-23 NOTE — Telephone Encounter (Signed)
 Inbound call from patient's wife stating patient is now at the hospital and she cancelled MRI that patient was scheduled for today. Requesting to know if an order can be put in for patient to have MRI while at [facility]. Requesting a call back at [phone] ext [phone]. Please advise, thank you

## 2024-01-26 ENCOUNTER — Ambulatory Visit: Payer: Self-pay | Admitting: *Deleted
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
## 2024-01-26 NOTE — Telephone Encounter (Signed)
 Will evaluate in office. Looking at the ED noted the EDP stated the patient left AMA but he had IVC paperwork?

## 2024-01-26 NOTE — Telephone Encounter (Signed)
 I have spoken to patient's wife, [name]. She is advised that she may reschedule patient's MRI to a date that works better for her/patient since patient did not get admitted to the hospital (he left AMA). She states that she is unsure what to do as patient is mentally unstable and will not move forward with any testing. She states she tried to have him involuntarily committed to the hospital due to his mental status but the doctor overrode it and let patient leave. She expresses frustration over patient's current state and not knowing what steps to take to be able to help him.  I advised that unfortunately, getting any health care power of attorney over patient may need to be taken care of through a legal capacity at this point but suggested she reach out to patient's PCP to see if they have any resources as to how to go about doing this. She states she will do this.

## 2024-01-27 ENCOUNTER — Other Ambulatory Visit: Payer: Self-pay

## 2024-01-27 ENCOUNTER — Ambulatory Visit: Admitting: Nurse Practitioner

## 2024-01-27 ENCOUNTER — Inpatient Hospital Stay (HOSPITAL_COMMUNITY)
Admission: EM | Admit: 2024-01-27 | Discharge: 2024-02-05 | DRG: 442 | Disposition: A | Discharge status: Skilled Nursing Facility | Attending: Internal Medicine | Admitting: Internal Medicine

## 2024-01-27 ENCOUNTER — Emergency Department (HOSPITAL_COMMUNITY)

## 2024-01-27 ENCOUNTER — Encounter (HOSPITAL_COMMUNITY): Payer: Self-pay | Admitting: Emergency Medicine

## 2024-01-27 ENCOUNTER — Telehealth: Payer: Self-pay

## 2024-01-27 ENCOUNTER — Inpatient Hospital Stay: Admitting: Nurse Practitioner

## 2024-01-27 DIAGNOSIS — Z7401 Bed confinement status: Secondary | ICD-10-CM | POA: Diagnosis not present

## 2024-01-27 DIAGNOSIS — G9389 Other specified disorders of brain: Secondary | ICD-10-CM | POA: Diagnosis present

## 2024-01-27 DIAGNOSIS — F10188 Alcohol abuse with other alcohol-induced disorder: Secondary | ICD-10-CM | POA: Diagnosis present

## 2024-01-27 DIAGNOSIS — R9431 Abnormal electrocardiogram [ECG] [EKG]: Secondary | ICD-10-CM | POA: Diagnosis present

## 2024-01-27 DIAGNOSIS — R000 Tachycardia, unspecified: Secondary | ICD-10-CM | POA: Diagnosis not present

## 2024-01-27 DIAGNOSIS — F419 Anxiety disorder, unspecified: Secondary | ICD-10-CM | POA: Diagnosis present

## 2024-01-27 DIAGNOSIS — S40021A Contusion of right upper arm, initial encounter: Secondary | ICD-10-CM | POA: Diagnosis not present

## 2024-01-27 DIAGNOSIS — E876 Hypokalemia: Secondary | ICD-10-CM | POA: Diagnosis present

## 2024-01-27 DIAGNOSIS — K7290 Hepatic failure, unspecified without coma: Secondary | ICD-10-CM | POA: Diagnosis not present

## 2024-01-27 DIAGNOSIS — W19XXXA Unspecified fall, initial encounter: Secondary | ICD-10-CM | POA: Diagnosis not present

## 2024-01-27 DIAGNOSIS — Y92231 Patient bathroom in hospital as the place of occurrence of the external cause: Secondary | ICD-10-CM | POA: Diagnosis not present

## 2024-01-27 DIAGNOSIS — R4182 Altered mental status, unspecified: Secondary | ICD-10-CM | POA: Diagnosis not present

## 2024-01-27 DIAGNOSIS — F1210 Cannabis abuse, uncomplicated: Secondary | ICD-10-CM | POA: Diagnosis not present

## 2024-01-27 DIAGNOSIS — E785 Hyperlipidemia, unspecified: Secondary | ICD-10-CM | POA: Diagnosis not present

## 2024-01-27 DIAGNOSIS — Z9181 History of falling: Secondary | ICD-10-CM

## 2024-01-27 DIAGNOSIS — Z5986 Financial insecurity: Secondary | ICD-10-CM

## 2024-01-27 DIAGNOSIS — Z66 Do not resuscitate: Secondary | ICD-10-CM | POA: Diagnosis not present

## 2024-01-27 DIAGNOSIS — I252 Old myocardial infarction: Secondary | ICD-10-CM

## 2024-01-27 DIAGNOSIS — Y900 Blood alcohol level of less than 20 mg/100 ml: Secondary | ICD-10-CM | POA: Diagnosis present

## 2024-01-27 DIAGNOSIS — Z7189 Other specified counseling: Secondary | ICD-10-CM | POA: Diagnosis not present

## 2024-01-27 DIAGNOSIS — E8770 Fluid overload, unspecified: Secondary | ICD-10-CM | POA: Diagnosis present

## 2024-01-27 DIAGNOSIS — S40022A Contusion of left upper arm, initial encounter: Secondary | ICD-10-CM | POA: Diagnosis present

## 2024-01-27 DIAGNOSIS — S0990XA Unspecified injury of head, initial encounter: Secondary | ICD-10-CM | POA: Diagnosis not present

## 2024-01-27 DIAGNOSIS — K7010 Alcoholic hepatitis without ascites: Secondary | ICD-10-CM | POA: Diagnosis present

## 2024-01-27 DIAGNOSIS — R627 Adult failure to thrive: Secondary | ICD-10-CM | POA: Diagnosis not present

## 2024-01-27 DIAGNOSIS — I214 Non-ST elevation (NSTEMI) myocardial infarction: Secondary | ICD-10-CM | POA: Diagnosis not present

## 2024-01-27 DIAGNOSIS — I10 Essential (primary) hypertension: Secondary | ICD-10-CM | POA: Diagnosis not present

## 2024-01-27 DIAGNOSIS — F1910 Other psychoactive substance abuse, uncomplicated: Secondary | ICD-10-CM | POA: Diagnosis not present

## 2024-01-27 DIAGNOSIS — F1010 Alcohol abuse, uncomplicated: Secondary | ICD-10-CM

## 2024-01-27 DIAGNOSIS — E512 Wernicke's encephalopathy: Secondary | ICD-10-CM | POA: Diagnosis not present

## 2024-01-27 DIAGNOSIS — G9340 Encephalopathy, unspecified: Secondary | ICD-10-CM | POA: Diagnosis not present

## 2024-01-27 DIAGNOSIS — G9341 Metabolic encephalopathy: Secondary | ICD-10-CM | POA: Diagnosis not present

## 2024-01-27 DIAGNOSIS — F0670 Mild neurocognitive disorder due to known physiological condition without behavioral disturbance: Secondary | ICD-10-CM | POA: Diagnosis present

## 2024-01-27 DIAGNOSIS — F411 Generalized anxiety disorder: Secondary | ICD-10-CM | POA: Diagnosis not present

## 2024-01-27 DIAGNOSIS — E7220 Disorder of urea cycle metabolism, unspecified: Secondary | ICD-10-CM | POA: Diagnosis not present

## 2024-01-27 DIAGNOSIS — S51011A Laceration without foreign body of right elbow, initial encounter: Secondary | ICD-10-CM | POA: Diagnosis not present

## 2024-01-27 DIAGNOSIS — E871 Hypo-osmolality and hyponatremia: Secondary | ICD-10-CM | POA: Diagnosis present

## 2024-01-27 DIAGNOSIS — H5462 Unqualified visual loss, left eye, normal vision right eye: Secondary | ICD-10-CM | POA: Diagnosis present

## 2024-01-27 DIAGNOSIS — K828 Other specified diseases of gallbladder: Secondary | ICD-10-CM | POA: Diagnosis not present

## 2024-01-27 DIAGNOSIS — E8809 Other disorders of plasma-protein metabolism, not elsewhere classified: Secondary | ICD-10-CM | POA: Diagnosis present

## 2024-01-27 DIAGNOSIS — J9811 Atelectasis: Secondary | ICD-10-CM | POA: Diagnosis present

## 2024-01-27 DIAGNOSIS — Z043 Encounter for examination and observation following other accident: Secondary | ICD-10-CM | POA: Diagnosis not present

## 2024-01-27 DIAGNOSIS — F17290 Nicotine dependence, other tobacco product, uncomplicated: Secondary | ICD-10-CM | POA: Diagnosis present

## 2024-01-27 DIAGNOSIS — M25421 Effusion, right elbow: Secondary | ICD-10-CM | POA: Diagnosis not present

## 2024-01-27 DIAGNOSIS — D1803 Hemangioma of intra-abdominal structures: Secondary | ICD-10-CM | POA: Diagnosis present

## 2024-01-27 DIAGNOSIS — K7031 Alcoholic cirrhosis of liver with ascites: Secondary | ICD-10-CM | POA: Diagnosis present

## 2024-01-27 DIAGNOSIS — R410 Disorientation, unspecified: Secondary | ICD-10-CM | POA: Diagnosis not present

## 2024-01-27 DIAGNOSIS — E639 Nutritional deficiency, unspecified: Secondary | ICD-10-CM | POA: Diagnosis present

## 2024-01-27 DIAGNOSIS — Z9889 Other specified postprocedural states: Secondary | ICD-10-CM

## 2024-01-27 DIAGNOSIS — J984 Other disorders of lung: Secondary | ICD-10-CM | POA: Diagnosis present

## 2024-01-27 DIAGNOSIS — F10139 Alcohol abuse with withdrawal, unspecified: Secondary | ICD-10-CM | POA: Diagnosis not present

## 2024-01-27 DIAGNOSIS — F10239 Alcohol dependence with withdrawal, unspecified: Secondary | ICD-10-CM | POA: Diagnosis not present

## 2024-01-27 DIAGNOSIS — K769 Liver disease, unspecified: Secondary | ICD-10-CM | POA: Diagnosis not present

## 2024-01-27 DIAGNOSIS — K766 Portal hypertension: Secondary | ICD-10-CM | POA: Diagnosis not present

## 2024-01-27 DIAGNOSIS — R5381 Other malaise: Secondary | ICD-10-CM | POA: Diagnosis present

## 2024-01-27 DIAGNOSIS — Z955 Presence of coronary angioplasty implant and graft: Secondary | ICD-10-CM

## 2024-01-27 DIAGNOSIS — K7460 Unspecified cirrhosis of liver: Secondary | ICD-10-CM | POA: Diagnosis not present

## 2024-01-27 DIAGNOSIS — F32A Depression, unspecified: Secondary | ICD-10-CM | POA: Diagnosis not present

## 2024-01-27 DIAGNOSIS — R278 Other lack of coordination: Secondary | ICD-10-CM | POA: Diagnosis not present

## 2024-01-27 DIAGNOSIS — R17 Unspecified jaundice: Secondary | ICD-10-CM | POA: Diagnosis not present

## 2024-01-27 DIAGNOSIS — K7682 Hepatic encephalopathy: Principal | ICD-10-CM | POA: Diagnosis present

## 2024-01-27 DIAGNOSIS — R918 Other nonspecific abnormal finding of lung field: Secondary | ICD-10-CM | POA: Diagnosis not present

## 2024-01-27 DIAGNOSIS — M6281 Muscle weakness (generalized): Secondary | ICD-10-CM | POA: Diagnosis not present

## 2024-01-27 DIAGNOSIS — D6489 Other specified anemias: Secondary | ICD-10-CM | POA: Diagnosis present

## 2024-01-27 DIAGNOSIS — R791 Abnormal coagulation profile: Secondary | ICD-10-CM | POA: Diagnosis present

## 2024-01-27 DIAGNOSIS — R188 Other ascites: Secondary | ICD-10-CM | POA: Diagnosis not present

## 2024-01-27 DIAGNOSIS — Z7982 Long term (current) use of aspirin: Secondary | ICD-10-CM

## 2024-01-27 DIAGNOSIS — Z79899 Other long term (current) drug therapy: Secondary | ICD-10-CM

## 2024-01-27 DIAGNOSIS — I2510 Atherosclerotic heart disease of native coronary artery without angina pectoris: Secondary | ICD-10-CM | POA: Diagnosis present

## 2024-01-27 DIAGNOSIS — Z515 Encounter for palliative care: Secondary | ICD-10-CM | POA: Diagnosis not present

## 2024-01-27 DIAGNOSIS — R2689 Other abnormalities of gait and mobility: Secondary | ICD-10-CM | POA: Diagnosis not present

## 2024-01-27 LAB — CBC WITH DIFFERENTIAL/PLATELET
Abs Immature Granulocytes: 0.07 K/uL (ref 0.00–0.07)
Basophils Absolute: 0 K/uL (ref 0.0–0.1)
Basophils Relative: 0 %
Eosinophils Absolute: 0 K/uL (ref 0.0–0.5)
Eosinophils Relative: 0 %
HCT: 32.1 % — ABNORMAL LOW (ref 39.0–52.0)
Hemoglobin: 11.3 g/dL — ABNORMAL LOW (ref 13.0–17.0)
Immature Granulocytes: 1 %
Lymphocytes Relative: 14 %
Lymphs Abs: 1.5 K/uL (ref 0.7–4.0)
MCH: 32.7 pg (ref 26.0–34.0)
MCHC: 35.2 g/dL (ref 30.0–36.0)
MCV: 92.8 fL (ref 80.0–100.0)
Monocytes Absolute: 1.3 K/uL — ABNORMAL HIGH (ref 0.1–1.0)
Monocytes Relative: 12 %
Neutro Abs: 8.1 K/uL — ABNORMAL HIGH (ref 1.7–7.7)
Neutrophils Relative %: 73 %
Platelets: 310 K/uL (ref 150–400)
RBC: 3.46 MIL/uL — ABNORMAL LOW (ref 4.22–5.81)
RDW: 15.5 % (ref 11.5–15.5)
WBC: 11 K/uL — ABNORMAL HIGH (ref 4.0–10.5)
nRBC: 0 % (ref 0.0–0.2)

## 2024-01-27 LAB — COMPREHENSIVE METABOLIC PANEL WITH GFR
ALT: 41 U/L (ref 0–44)
ALT: 37 U/L (ref 0–44)
AST: 127 U/L — ABNORMAL HIGH (ref 15–41)
AST: 104 U/L — ABNORMAL HIGH (ref 15–41)
Albumin: 1.5 g/dL — ABNORMAL LOW (ref 3.5–5.0)
Albumin: <1.5 g/dL — ABNORMAL LOW (ref 3.5–5.0)
Alkaline Phosphatase: 137 U/L — ABNORMAL HIGH (ref 38–126)
Alkaline Phosphatase: 129 U/L — ABNORMAL HIGH (ref 38–126)
Anion gap: 14 (ref 5–15)
Anion gap: 8 (ref 5–15)
BUN: <5 mg/dL — ABNORMAL LOW (ref 6–20)
BUN: <5 mg/dL — ABNORMAL LOW (ref 6–20)
CO2: 24 mmol/L (ref 22–32)
CO2: 27 mmol/L (ref 22–32)
Calcium: 8 mg/dL — ABNORMAL LOW (ref 8.9–10.3)
Calcium: 7.7 mg/dL — ABNORMAL LOW (ref 8.9–10.3)
Chloride: 91 mmol/L — ABNORMAL LOW (ref 98–111)
Chloride: 97 mmol/L — ABNORMAL LOW (ref 98–111)
Creatinine, Ser: 1.18 mg/dL (ref 0.61–1.24)
Creatinine, Ser: 1.01 mg/dL (ref 0.61–1.24)
GFR, Estimated: >60 mL/min (ref >60)
GFR, Estimated: >60 mL/min (ref >60)
Glucose, Bld: 115 mg/dL — ABNORMAL HIGH (ref 70–99)
Glucose, Bld: 97 mg/dL (ref 70–99)
Potassium: 2.7 mmol/L — CL (ref 3.5–5.1)
Potassium: 2.9 mmol/L — ABNORMAL LOW (ref 3.5–5.1)
Sodium: 129 mmol/L — ABNORMAL LOW (ref 135–145)
Sodium: 132 mmol/L — ABNORMAL LOW (ref 135–145)
Total Bilirubin: 2.4 mg/dL — ABNORMAL HIGH (ref 0.0–1.2)
Total Bilirubin: 2.3 mg/dL — ABNORMAL HIGH (ref 0.0–1.2)
Total Protein: 6.3 g/dL — ABNORMAL LOW (ref 6.5–8.1)
Total Protein: 6.1 g/dL — ABNORMAL LOW (ref 6.5–8.1)

## 2024-01-27 LAB — FOLATE: Folate: 12.4 ng/mL (ref >5.9)

## 2024-01-27 LAB — MAGNESIUM: Magnesium: 1.6 mg/dL — ABNORMAL LOW (ref 1.7–2.4)

## 2024-01-27 LAB — ETHANOL: Alcohol, Ethyl (B): <15 mg/dL (ref <15)

## 2024-01-27 LAB — AMMONIA: Ammonia: 58 umol/L — ABNORMAL HIGH (ref 9–35)

## 2024-01-27 MED ORDER — POTASSIUM CHLORIDE CRYS ER 20 MEQ PO TBCR
40.0000 meq | EXTENDED_RELEASE_TABLET | Freq: Once | ORAL | Status: AC
  Administered 2024-01-27: 40 meq via ORAL
  Filled 2024-01-27: qty 2

## 2024-01-27 MED ORDER — SODIUM CHLORIDE 0.9% FLUSH
3.0000 mL | Freq: Two times a day (BID) | INTRAVENOUS | Status: DC
  Administered 2024-01-27: 10 mL via INTRAVENOUS
  Administered 2024-01-27: 5 mL via INTRAVENOUS
  Administered 2024-01-28 – 2024-01-29 (×2): 10 mL via INTRAVENOUS
  Administered 2024-01-30: 3 mL via INTRAVENOUS
  Administered 2024-01-30 – 2024-02-01 (×3): 10 mL via INTRAVENOUS
  Administered 2024-02-02 – 2024-02-03 (×3): 3 mL via INTRAVENOUS
  Administered 2024-02-04: 10 mL via INTRAVENOUS
  Administered 2024-02-05: 3 mL via INTRAVENOUS

## 2024-01-27 MED ORDER — PANTOPRAZOLE SODIUM 40 MG IV SOLR
40.0000 mg | Freq: Two times a day (BID) | INTRAVENOUS | Status: DC
  Administered 2024-01-27 (×2): 40 mg via INTRAVENOUS
  Filled 2024-01-27 (×2): qty 10

## 2024-01-27 MED ORDER — ADULT MULTIVITAMIN W/MINERALS CH
1.0000 | ORAL_TABLET | Freq: Every day | ORAL | Status: DC
  Administered 2024-01-27 – 2024-02-05 (×10): 1 via ORAL
  Filled 2024-01-27 (×10): qty 1

## 2024-01-27 MED ORDER — LORAZEPAM 1 MG PO TABS
1.0000 mg | ORAL_TABLET | ORAL | Status: AC | PRN
  Administered 2024-01-27 – 2024-01-30 (×2): 1 mg via ORAL
  Filled 2024-01-27 (×2): qty 1

## 2024-01-27 MED ORDER — SODIUM CHLORIDE 0.9% FLUSH
3.0000 mL | Freq: Two times a day (BID) | INTRAVENOUS | Status: DC
  Administered 2024-01-27 – 2024-02-05 (×18): 3 mL via INTRAVENOUS

## 2024-01-27 MED ORDER — HEPARIN SODIUM (PORCINE) 5000 UNIT/ML IJ SOLN
5000.0000 [IU] | Freq: Three times a day (TID) | INTRAMUSCULAR | Status: DC
  Administered 2024-01-27 – 2024-02-05 (×27): 5000 [IU] via SUBCUTANEOUS
  Filled 2024-01-27 (×27): qty 1

## 2024-01-27 MED ORDER — ACETAMINOPHEN 325 MG PO TABS
650.0000 mg | ORAL_TABLET | Freq: Four times a day (QID) | ORAL | Status: DC | PRN
  Administered 2024-01-27 – 2024-02-04 (×5): 650 mg via ORAL
  Filled 2024-01-27 (×5): qty 2

## 2024-01-27 MED ORDER — FOLIC ACID 1 MG PO TABS
1.0000 mg | ORAL_TABLET | Freq: Every day | ORAL | Status: DC
  Administered 2024-01-27 – 2024-02-05 (×10): 1 mg via ORAL
  Filled 2024-01-27 (×10): qty 1

## 2024-01-27 MED ORDER — THIAMINE HCL 100 MG/ML IJ SOLN
100.0000 mg | Freq: Every day | INTRAMUSCULAR | Status: DC
  Filled 2024-01-27: qty 2

## 2024-01-27 MED ORDER — LACTULOSE 10 GM/15ML PO SOLN
10.0000 g | Freq: Once | ORAL | Status: AC
  Administered 2024-01-27: 10 g via ORAL
  Filled 2024-01-27: qty 15

## 2024-01-27 MED ORDER — SODIUM CHLORIDE 0.9% FLUSH: 3.0000 mL | INTRAVENOUS | Status: DC | PRN

## 2024-01-27 MED ORDER — ACETAMINOPHEN 650 MG RE SUPP: 650.0000 mg | Freq: Four times a day (QID) | RECTAL | Status: DC | PRN

## 2024-01-27 MED ORDER — POTASSIUM CHLORIDE 10 MEQ/100ML IV SOLN
10.0000 meq | INTRAVENOUS | Status: AC
  Administered 2024-01-27 (×4): 10 meq via INTRAVENOUS
  Filled 2024-01-27 (×4): qty 100

## 2024-01-27 MED ORDER — NICOTINE 21 MG/24HR TD PT24
21.0000 mg | MEDICATED_PATCH | Freq: Every day | TRANSDERMAL | Status: DC
  Administered 2024-01-27 – 2024-02-05 (×10): 21 mg via TRANSDERMAL
  Filled 2024-01-27 (×10): qty 1

## 2024-01-27 MED ORDER — VITAMIN B-12 1000 MCG PO TABS
1000.0000 ug | ORAL_TABLET | Freq: Every day | ORAL | Status: DC
  Administered 2024-01-27 – 2024-02-05 (×10): 1000 ug via ORAL
  Filled 2024-01-27 (×10): qty 1

## 2024-01-27 MED ORDER — THIAMINE MONONITRATE 100 MG PO TABS
100.0000 mg | ORAL_TABLET | Freq: Every day | ORAL | Status: DC
  Administered 2024-01-27 – 2024-01-31 (×5): 100 mg via ORAL
  Filled 2024-01-27 (×5): qty 1

## 2024-01-27 MED ORDER — THIAMINE HCL 100 MG/ML IJ SOLN
500.0000 mg | Freq: Once | INTRAVENOUS | Status: AC
  Administered 2024-01-27: 500 mg via INTRAVENOUS
  Filled 2024-01-27: qty 5

## 2024-01-27 MED ORDER — LORAZEPAM 2 MG/ML IJ SOLN: 1.0000 mg | INTRAMUSCULAR | Status: AC | PRN

## 2024-01-27 MED ORDER — BUSPIRONE HCL 10 MG PO TABS
10.0000 mg | ORAL_TABLET | Freq: Two times a day (BID) | ORAL | Status: DC
  Administered 2024-01-27 – 2024-02-05 (×18): 10 mg via ORAL
  Filled 2024-01-27 (×18): qty 1

## 2024-01-27 MED ORDER — SODIUM CHLORIDE 0.9 % IV BOLUS
1000.0000 mL | Freq: Once | INTRAVENOUS | Status: AC
  Administered 2024-01-27: 1000 mL via INTRAVENOUS

## 2024-01-27 MED ORDER — ATORVASTATIN CALCIUM 80 MG PO TABS
80.0000 mg | ORAL_TABLET | Freq: Every day | ORAL | Status: DC
  Administered 2024-01-27 – 2024-02-05 (×10): 80 mg via ORAL
  Filled 2024-01-27 (×10): qty 1

## 2024-01-27 MED ORDER — FLUOXETINE HCL 20 MG PO CAPS
40.0000 mg | ORAL_CAPSULE | Freq: Every day | ORAL | Status: DC
  Administered 2024-01-27 – 2024-02-05 (×10): 40 mg via ORAL
  Filled 2024-01-27 (×10): qty 2

## 2024-01-27 MED ORDER — LACTULOSE 10 GM/15ML PO SOLN
30.0000 g | Freq: Once | ORAL | Status: AC
  Administered 2024-01-27: 30 g via ORAL
  Filled 2024-01-27: qty 60

## 2024-01-27 MED ORDER — TAMSULOSIN HCL 0.4 MG PO CAPS
0.4000 mg | ORAL_CAPSULE | Freq: Every day | ORAL | Status: DC
  Administered 2024-01-27 – 2024-02-05 (×10): 0.4 mg via ORAL
  Filled 2024-01-27 (×10): qty 1

## 2024-01-27 NOTE — Progress Notes (Deleted)
   Acute Office Visit  Subjective:     Patient ID: Jeffrey Hodges, male    DOB: 02/19/1966, 58 y.o.   MRN: [id number]  No chief complaint on file.   HPI Patient is in today for ***  Of note patient was seen by me on 01/19/2024 after hospitalization for alcohol abuse and encephalopathy.  Patient was discharged to a nursing facility and had to leave early due to financial constraints.  He had been out of his medication for approximately 1 week when he saw me.  In office patient had clear ascites with altered mental status and chest pain.  He was referred to the hospital.  Patient to go to the emergency department admitted for workup and drew 4 L off of patient's abdomen.  [name] called stating that patient was not acting correctly until he was going to the neighbor sitting on her door.  Advised her was a safety issue to go to the magistrate's office and issue an IVC.  She had that the patient present to the emergency department on 01/23/2024.  Patient's labs show significant hypokalemia and mild dehydration.  They offered patient admission to the hospital in which he refused per the ED note patient left AMA.  He did decline self harm or harm to other folks.  Do see where EDP rescinded IVC ROS      Objective:    There were no vitals taken for this visit. {Vitals History (Optional):23777}  Physical Exam  No results found for any visits on 01/27/24.      Assessment & Plan:   Problem List Items Addressed This Visit   None   No orders of the defined types were placed in this encounter.   No follow-ups on file.  Adina Crandall, NP

## 2024-01-27 NOTE — Progress Notes (Signed)
 CSW added substance abuse resources to patient's AVS.  Edwin Dada, MSW, LCSW Transitions of Care  Clinical Social Worker II [phone]
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
## 2024-01-27 NOTE — Telephone Encounter (Signed)
 Phone number on file for Jeffrey Hodges rings busy x 2. No answer at work number previously provided.  I do note that patient is currently back in the emergency room and is under evaluation at this time.

## 2024-01-27 NOTE — Discharge Instructions (Signed)

## 2024-01-27 NOTE — ED Notes (Signed)
 Pt reports having alcohol all evening into today 01/27/2024.

## 2024-01-27 NOTE — Telephone Encounter (Signed)
 I do not have advice in order to get him admitted. She needs to be there and talk with the ED provider and express her wishes. Looks like he is going to be admitted through the chart

## 2024-01-27 NOTE — ED Notes (Signed)
 Waiting on thiamine  infusion from main pharmacy.

## 2024-01-27 NOTE — Telephone Encounter (Signed)
 Patient spouse calling in regards to previous note. Please advise.

## 2024-01-27 NOTE — ED Notes (Signed)
 CCMD called, pt on monitor

## 2024-01-27 NOTE — ED Notes (Signed)
 Stool incontinence noted. Patient cleaned up and changed into fresh brief and gown. Linen change done.

## 2024-01-27 NOTE — ED Provider Notes (Signed)
 [place] EMERGENCY DEPARTMENT AT [facility] Provider Note   CSN: [id number] Arrival date & time: 01/27/24  9175     Patient presents with: No chief complaint on file.   Jeffrey Hodges is a 58 y.o. male.  Patient with a history of anxiety, depression, tobacco use, acute hepatic encephalopathy presents to the ED with concerns of confusion.  Patient brought in by EMS with concerns of increasing confusion.  He was found in the neighbors yard yelling.  Patient himself endorses alcohol use but cannot provide any further details on exact last use.  He is confused regarding time, location, but can orient to self.   HPI     Prior to Admission medications   Medication Sig Start Date End Date Taking? Authorizing Provider  aspirin  EC 81 MG tablet Take 1 tablet (81 mg total) by mouth daily. Swallow whole. Patient taking differently: Take 81 mg by mouth at bedtime. Swallow whole. 09/20/22  Yes Monge, Damien BROCKS, NP  atorvastatin  (LIPITOR) 80 MG tablet Take 1 tablet (80 mg total) by mouth daily. 09/20/22   Daneen Damien BROCKS, NP  busPIRone  (BUSPAR ) 10 MG tablet Take 1 tablet (10 mg total) by mouth 2 (two) times daily. 01/21/24   [name], NP  cyanocobalamin  (VITAMIN B12) 1000 MCG tablet Take 1 tablet (1,000 mcg total) by mouth daily. 10/27/23   Stacia Glendia BRAVO, MD  FLUoxetine  (PROZAC ) 40 MG capsule Take 1 capsule (40 mg total) by mouth daily. 01/21/24   [name], NP  folic acid  (FOLVITE ) 1 MG tablet Take 1 tablet (1 mg total) by mouth daily. 10/27/23   Stacia Glendia BRAVO, MD  furosemide  (LASIX ) 40 MG tablet Take 1 tablet (40 mg total) by mouth 2 (two) times daily. 01/20/24 01/19/25  [name], NP  lactulose  (CHRONULAC ) 10 GM/15ML solution Take 30 mLs (20 g total) by mouth 2 (two) times daily. 01/20/24 02/19/24  [name], NP  nicotine  (NICODERM CQ  - DOSED IN MG/24 HOURS) 21 mg/24hr patch Place 1 patch (21 mg total) onto the skin daily. 01/06/24   Singh, Prashant K, MD  nitroGLYCERIN   (NITROSTAT ) 0.4 MG SL tablet Place 1 tablet (0.4 mg total) under the tongue every 5 (five) minutes x 3 doses as needed for chest pain. 09/20/22   Daneen Damien BROCKS, NP  pantoprazole  (PROTONIX ) 40 MG tablet Take 1 tablet (40 mg total) by mouth daily. 01/06/24   Singh, Prashant K, MD  potassium chloride  (KLOR-CON ) 10 MEQ tablet Take 1 tablet (10 mEq total) by mouth daily. 01/23/24   Suzette Pac, MD  spironolactone  (ALDACTONE ) 25 MG tablet Take 1 tablet (25 mg total) by mouth daily. 01/20/24 02/19/24  [name], NP  tamsulosin  (FLOMAX ) 0.4 MG CAPS capsule Take 1 capsule (0.4 mg total) by mouth daily. 01/06/24   Singh, Prashant K, MD  thiamine  (VITAMIN B1) 100 MG tablet Take 1 tablet (100 mg total) by mouth daily. 01/06/24   Singh, Prashant K, MD  Vitamin D , Ergocalciferol , (DRISDOL ) 1.25 MG (50000 UNIT) CAPS capsule X 24 weeks Patient taking differently: Take 50,000 Units by mouth every 7 (seven) days. 10/27/23   Stacia Glendia BRAVO, MD    Allergies: Patient has no known allergies.    Review of Systems  Psychiatric/Behavioral:  Positive for confusion.   All other systems reviewed and are negative.   Updated Vital Signs BP (!) 110/92   Pulse 92   Temp 98.2 F (36.8 C) (Oral)   Resp 20  Ht 6' 1 (1.854 m)   Wt 99.8 kg   SpO2 98%   BMI 29.03 kg/m   Physical Exam Vitals and nursing note reviewed.  Constitutional:      General: He is not in acute distress.    Appearance: He is well-developed.  HENT:     Head: Normocephalic and atraumatic.  Eyes:     Conjunctiva/sclera: Conjunctivae normal.  Cardiovascular:     Rate and Rhythm: Normal rate and regular rhythm.     Heart sounds: No murmur heard. Pulmonary:     Effort: Pulmonary effort is normal. No respiratory distress.     Breath sounds: Normal breath sounds.  Abdominal:     Palpations: Abdomen is soft.     Tenderness: There is no abdominal tenderness.  Musculoskeletal:        General: No swelling.     Cervical back: Neck supple.   Skin:    General: Skin is warm and dry.     Capillary Refill: Capillary refill takes less than 2 seconds.     Findings: Bruising present.     Comments: Bruising to bilateral arms  Neurological:     Mental Status: He is alert.     Comments: No asterixis or tremors noted.  Psychiatric:        Mood and Affect: Mood normal.     (all labs ordered are listed, but only abnormal results are displayed) Labs Reviewed  CBC WITH DIFFERENTIAL/PLATELET - Abnormal; Notable for the following components:      Result Value   WBC 11.0 (*)    RBC 3.46 (*)    Hemoglobin 11.3 (*)    HCT 32.1 (*)    Neutro Abs 8.1 (*)    Monocytes Absolute 1.3 (*)    All other components within normal limits  COMPREHENSIVE METABOLIC PANEL WITH GFR - Abnormal; Notable for the following components:   Sodium 129 (*)    Potassium 2.7 (*)    Chloride 91 (*)    Glucose, Bld 115 (*)    BUN <5 (*)    Calcium  8.0 (*)    Total Protein 6.3 (*)    Albumin  1.5 (*)    AST 127 (*)    Alkaline Phosphatase 137 (*)    Total Bilirubin 2.4 (*)    All other components within normal limits  AMMONIA - Abnormal; Notable for the following components:   Ammonia 58 (*)    All other components within normal limits  MAGNESIUM  - Abnormal; Notable for the following components:   Magnesium  1.6 (*)    All other components within normal limits  ETHANOL  FOLATE  COMPREHENSIVE METABOLIC PANEL WITH GFR  COMPREHENSIVE METABOLIC PANEL WITH GFR  CBC    EKG: EKG Interpretation Date/Time:  Tuesday January 27 2024 09:33:18 EDT Ventricular Rate:  98 PR Interval:  141 QRS Duration:  96 QT Interval:  458 QTC Calculation: 585 R Axis:   58  Text Interpretation: Sinus rhythm Borderline low voltage, extremity leads Prolonged QT interval Artifact in lead(s) I II aVR aVL V1 V2 Confirmed by Darra Chew [phone]) on 01/27/2024 9:38:10 AM  Radiology: [facility] Chest Portable 1 View Result Date: 01/27/2024 CLINICAL DATA:  Confusion EXAM: PORTABLE CHEST 1  VIEW COMPARISON:  X-ray 01/02/2024 FINDINGS: Underinflation. There is some linear opacity at the right lung base likely scar or atelectasis. No consolidation, pneumothorax or effusion. No edema. Overlapping cardiac leads. Normal cardiopericardial silhouette. IMPRESSION: Underinflation.  Right basilar scar or atelectasis. Electronically Signed   By:  Ranell Bring M.D.   On: 01/27/2024 09:57     Procedures   Medications Ordered in the ED  pantoprazole  (PROTONIX ) injection 40 mg (40 mg Intravenous Given 01/27/24 1326)  LORazepam  (ATIVAN ) tablet 1-4 mg (has no administration in time range)    Or  LORazepam  (ATIVAN ) injection 1-4 mg (has no administration in time range)  thiamine  (VITAMIN B1) tablet 100 mg (100 mg Oral Given 01/27/24 1323)    Or  thiamine  (VITAMIN B1) injection 100 mg ( Intravenous See Alternative 01/27/24 1323)  folic acid  (FOLVITE ) tablet 1 mg (1 mg Oral Given 01/27/24 1323)  multivitamin with minerals tablet 1 tablet (1 tablet Oral Given 01/27/24 1323)  sodium chloride  flush (NS) 0.9 % injection 3 mL (3 mLs Intravenous Given 01/27/24 1339)  acetaminophen  (TYLENOL ) tablet 650 mg (has no administration in time range)    Or  acetaminophen  (TYLENOL ) suppository 650 mg (has no administration in time range)  sodium chloride  flush (NS) 0.9 % injection 3-10 mL (5 mLs Intravenous Given 01/27/24 1340)  sodium chloride  flush (NS) 0.9 % injection 3-10 mL (has no administration in time range)  heparin  injection 5,000 Units (5,000 Units Subcutaneous Given 01/27/24 1448)  sodium chloride  0.9 % bolus 1,000 mL (0 mLs Intravenous Stopped 01/27/24 1205)  potassium chloride  10 mEq in 100 mL IVPB (0 mEq Intravenous Stopped 01/27/24 1637)  lactulose  (CHRONULAC ) 10 GM/15ML solution 30 g (30 g Oral Given 01/27/24 1118)  potassium chloride  SA (KLOR-CON  M) CR tablet 40 mEq (40 mEq Oral Given 01/27/24 1333)  thiamine  (VITAMIN B1) 500 mg in sodium chloride  0.9 % 50 mL IVPB (0 mg Intravenous Stopped 01/27/24 1409)                                     Medical Decision Making Amount and/or Complexity of Data Reviewed Labs: ordered. Radiology: ordered.  Risk Prescription drug management. Decision regarding hospitalization.   This patient presents to the ED for concern of confusion, this involves an extensive number of treatment options, and is a complaint that carries with it a high risk of complications and morbidity.  The differential diagnosis includes encephalopathy, \intoxication, dehydration, AKI   Co morbidities that complicate the patient evaluation  Prior history of hepatic encephalopathy, ascites, anxiety, depression, tobacco use   Lab Tests:  I Ordered, and personally interpreted labs.  The pertinent results include: CBC shows leukocytosis 11.0, hemoglobin stable 11.3, and platelets unremarkable at 310.  CMP shows notable electrolyte derangements with sodium at 129, potassium of 2.7, chloride at 91.  No obvious or significant AKI.  Bilirubin is slightly elevated at 2.4.  Magnesium  low at 1.6.  Ammonia level is elevated once again at 58.   Imaging Studies ordered:  I ordered imaging studies including chest x-ray I independently visualized and interpreted imaging which showed underinflation, right base atelectasis/scarring. I agree with the radiologist interpretation   Cardiac Monitoring: / EKG:  The patient was maintained on a cardiac monitor.  I personally viewed and interpreted the cardiac monitored which showed an underlying rhythm of: Sinus rhythm, prolonged QT   Consultations Obtained:  I requested consultation with the hospitalist,  and discussed lab and imaging findings as well as pertinent plan - they recommend: Spoke with Dr. Tobie, hospitalist, who admitting patient.   Problem List / ED Course / Critical interventions / Medication management  Patient with past history significant for depression, anxiety, tobacco use, acute [facility]  encephalopathy presents to ED  with concerns of confusion.  Patient reportedly had police called on him after he was found outside speaking gibberish and confused after wife was concerned that he was intoxicated once again.  Patient does have a history of significant alcohol use.  Initially reported alcohol use but no alcohol odor was noted on patient. On exam, patient is confused.  He is only oriented to self with no concept of location or time at this time.  He reported that it was 2009, January 26.  Denies any area of pain in the chest, abdomen, pelvis.  No obvious signs of injury or trauma but he does have notable bruising to bilateral arms.  He cannot recall why he has this bruising.  Denies pain in this area. Given patient's presentation, concern for encephalopathy once again.  Will proceed with lab workup including ammonia for assessment of encephalopathy as well as CBC and CMP for evaluation possible electrolyte derangement versus other causes of symptoms such as dehydration, hyponatremia, intoxication. Workup shows patient is not intoxicated his ethanol is less than 15.  CMP shows signs of dehydration with sodium, potassium, and chloride all low.  Bilirubin slightly elevated at 2.4.  Magnesium  also low at 1.6.  CBC unremarkable.  Ammonia is elevated at 58.  Suspect likely hepatic encephalopathy.  Will consult close for admission.  Started patient on potassium chloride  infusion as well as tablet ministration for management of his hypokalemia. Spoke with Dr. Tobie, hospitalist, will be admitting patient.  She was requesting additional clarification of patient's recent ED visit in which he was reportedly IVC by law enforcement but left AMA.  Advised Dr. Tobie that based on review of this documentation,.  That physician at that time had determined that patient no longer met criteria for IVC and patient was allowed to leave AMA. I ordered medication including fluids, potassium infusion, potassium p.o., lactulose  for dehydration,  hypokalemia, encephalopathy Reevaluation of the patient after these medicines showed that the patient stayed the same I have reviewed the patients home medicines and have made adjustments as needed   Social Determinants of Health:  History of alcoholic cirrhosis   Test / Admission - Considered:  Patient requiring admission  Final diagnoses:  Hypokalemia  Hypomagnesemia  Hepatic encephalopathy [facility])    ED Discharge Orders     None          Cecily Legrand LABOR, PA-C 01/27/24 1709    Darra Fonda MATSU, MD 01/29/24 [phone]

## 2024-01-27 NOTE — H&P (Signed)
 History and Physical    Patient: Jeffrey Hodges FMW:[id number] DOB: [birth year] DOA: 01/27/2024 DOS: the patient was seen and examined on 01/27/2024 . PCP: [name], NP  Patient coming from: Home Chief complaint: Hallucination.   HPI:  Jeffrey Hodges is a 58 y.o. male with past medical history  of CAD status post stent, history of syncope, history of high risk of falls, history of heavy alcohol abuse, anemia, AKI, depression anxiety, alcoholic hepatitis, hyperlipidemia, tobacco abuse, essential hypertension, history of metabolic encephalopathy suspicious for Wernicke's encephalopathy, history of DTs, chronic hyponatremia, brought to the emergency room via EMS patient was found crawling into the neighbors yard was confused similar episode happened on the 11th where he saw snake in the neighbors yard.  Chart review shows commitment papers on 11 July I am unable to verify how the patient was discharged I have discussed with ED MD who is currently busy in the trauma.  The pt comes bnack with same presentation and is hallucinating.  ED Course:  Vital signs in the ED were notable for the following: Vital stable since arrival Vitals:   01/27/24 1640 01/27/24 1729 01/27/24 1730 01/27/24 1830  BP:   112/71 106/78  Pulse:  85 87 88  Temp: 98.2 F (36.8 C)     Resp:      Height:      Weight:      SpO2:  97%    TempSrc: Oral     BMI (Calculated):      >>ED evaluation thus far shows: CMP shows hyponatremia 129 potassium 2.7 chloride 91 glucose 115 BUN less than 5 normal creatinine 1.18 calcium  8.0 magnesium  ordered and pending.  Phos 137 albumin  1.5 AST of 127 and ALT 41 total protein 6.3 ammonia 58 total bili 2.4. CBC shows white count 5 hemoglobin 10.3, platelets 310. AFP of 12.9 on 30 October 2023.  Right basilar scar or atelectasis.  >>While in the ED patient received the following: Medications  potassium chloride  10 mEq in 100 mL IVPB (10 mEq Intravenous New Bag/Given 01/27/24 1202)   potassium chloride  SA (KLOR-CON  M) CR tablet 40 mEq (has no administration in time range)  sodium chloride  0.9 % bolus 1,000 mL (0 mLs Intravenous Stopped 01/27/24 1205)  lactulose  (CHRONULAC ) 10 GM/15ML solution 30 g (30 g Oral Given 01/27/24 1118)   Review of Systems  Unable to perform ROS: Other (awake alert disoriented.)  Psychiatric/Behavioral:  Positive for hallucinations.    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 His smoking use included cigarettes. He has never been exposed to tobacco smoke. He has never used smokeless tobacco. He reports current alcohol use. He reports current drug use. Drug: Marijuana. No Known Allergies Family History  Problem Relation Age of Onset   CAD Neg Hx  CVA Neg Hx    Diabetes Neg Hx    Prior to Admission medications   Medication Sig Start Date End Date Taking? Authorizing Provider  aspirin  EC 81 MG tablet Take 1 tablet (81 mg total) by mouth daily. Swallow whole. 09/20/22   Daneen Damien BROCKS, NP  atorvastatin  (LIPITOR) 80 MG tablet Take 1 tablet (80 mg total) by mouth daily. 09/20/22   Daneen Damien BROCKS, NP  busPIRone  (BUSPAR ) 10 MG tablet Take 1 tablet (10 mg total) by mouth 2 (two) times daily. 01/21/24   [name], NP  cyanocobalamin  (VITAMIN B12) 1000 MCG tablet Take 1 tablet (1,000 mcg  total) by mouth daily. 10/27/23   Stacia Glendia BRAVO, MD  FLUoxetine  (PROZAC ) 40 MG capsule Take 1 capsule (40 mg total) by mouth daily. 01/21/24   [name], NP  folic acid  (FOLVITE ) 1 MG tablet Take 1 tablet (1 mg total) by mouth daily. 10/27/23   Stacia Glendia BRAVO, MD  furosemide  (LASIX ) 40 MG tablet Take 1 tablet (40 mg total) by mouth 2 (two) times daily. 01/20/24 01/19/25  [name], NP  lactulose  (CHRONULAC ) 10 GM/15ML solution Take 30 mLs (20 g total) by mouth 2 (two) times daily. 01/20/24 02/19/24  [name], NP  nicotine  (NICODERM CQ  - DOSED IN MG/24 HOURS) 21 mg/24hr patch Place 1 patch (21 mg total) onto the skin daily. 01/06/24   Singh, Prashant K, MD  nitroGLYCERIN  (NITROSTAT ) 0.4 MG SL tablet Place 1 tablet (0.4 mg total) under the tongue every 5 (five) minutes x 3 doses as needed for chest pain. 09/20/22   Daneen Damien BROCKS, NP  pantoprazole  (PROTONIX ) 40 MG tablet Take 1 tablet (40 mg total) by mouth daily. 01/06/24   Singh, Prashant K, MD  potassium chloride  (KLOR-CON ) 10 MEQ tablet Take 1 tablet (10 mEq total) by mouth daily. 01/23/24   Suzette Pac, MD  spironolactone  (ALDACTONE ) 25 MG tablet Take 1 tablet (25 mg total) by mouth daily. 01/20/24 02/19/24  [name], NP  tamsulosin  (FLOMAX ) 0.4 MG CAPS capsule Take 1 capsule (0.4 mg total) by mouth daily. 01/06/24   Singh, Prashant K, MD  thiamine  (VITAMIN B1) 100 MG tablet Take 1 tablet (100 mg total) by mouth daily. 01/06/24   Singh, Prashant K, MD  Vitamin D , Ergocalciferol , (DRISDOL ) 1.25 MG (50000 UNIT) CAPS capsule X 24 weeks Patient taking differently: Take 50,000 Units by mouth every 7 (seven) days. 10/27/23   Stacia Glendia BRAVO, MD                                                                                 Vitals:   01/27/24 1640 01/27/24 1729 01/27/24 1730 01/27/24 1830  BP:   112/71 106/78  Pulse:  85 87 88  Resp:      Temp: 98.2 F (36.8 C)     TempSrc: Oral     SpO2:  97%    Weight:      Height:        Physical Exam Vitals and nursing note reviewed.  Constitutional:      General: He is not in acute distress.    Appearance: He is not ill-appearing.  HENT:     Head: Normocephalic and atraumatic.     Right Ear: Hearing normal.     Left Ear: Hearing normal.     Nose: No nasal deformity.     Mouth/Throat:     Lips: Pink.  Eyes:     General: Lids are normal.     Extraocular Movements: Extraocular movements intact.     Pupils: Pupils are equal, round, and reactive to light.  Cardiovascular:     Rate and Rhythm: Normal rate and regular rhythm.     Pulses: Normal pulses.     Heart sounds: Normal heart sounds.  Pulmonary:     Effort: Pulmonary effort is normal.     Breath sounds: Normal breath sounds.  Abdominal:     General: Bowel sounds are normal. There is no distension.     Palpations: Abdomen is soft. There is no mass.     Tenderness: There is no abdominal tenderness.  Musculoskeletal:     Right lower leg: No edema.     Left lower leg: No edema.  Skin:    General: Skin is warm and dry.     Findings: Bruising present.  Neurological:     General: No focal deficit present.     Mental Status: He is alert. He is disoriented.     Cranial Nerves: Cranial nerves 2-12 are intact.  Psychiatric:        Speech: Speech normal.     Labs on Admission: I have personally reviewed following labs and imaging studies CBC: Recent Labs  Lab 01/23/24 1130 01/27/24 0842  WBC 9.7 11.0*  NEUTROABS  --  8.1*  HGB 12.6* 11.3*  HCT 36.6* 32.1*  MCV 93.1 92.8  PLT 362 310   Basic Metabolic Panel: Recent Labs  Lab 01/23/24 1130 01/27/24 0842  NA 132* 129*  K 2.4* 2.7*  CL 92* 91*  CO2 24 24  GLUCOSE 110* 115*  BUN <5* <5*  CREATININE 1.32* 1.18  CALCIUM  8.0* 8.0*  MG  --  1.6*   GFR: Estimated Creatinine Clearance: 87.9 mL/min (by C-G formula based on SCr of 1.18 mg/dL). Liver Function Tests: Recent Labs  Lab 01/23/24 1130 01/27/24 0842  AST 74* 127*  ALT 32 41   ALKPHOS 160* 137*  BILITOT 2.1* 2.4*  PROT 7.2 6.3*  ALBUMIN  1.9* 1.5*   No results for input(s): LIPASE, AMYLASE in the last 168 hours. Recent Labs  Lab 01/23/24 1245 01/27/24 0842  AMMONIA 16 58*   Coagulation Profile: No results for input(s): INR, PROTIME in the last 168 hours. Cardiac Enzymes: No results for input(s): CKTOTAL, CKMB, CKMBINDEX, TROPONINI in the last 168 hours. BNP (last 3 results) No results for input(s): PROBNP in the last 8760 hours. HbA1C: No results for input(s): HGBA1C in the last 72 hours. CBG: No results for input(s): GLUCAP in the last 168 hours. Lipid Profile: No results for input(s): CHOL, HDL, LDLCALC, TRIG, CHOLHDL, LDLDIRECT in the last 72 hours. Thyroid  Function Tests: No results for input(s): TSH, T4TOTAL, FREET4, T3FREE, THYROIDAB in the last 72 hours. Anemia Panel: Recent Labs    01/27/24 0842  FOLATE 12.4   Urine analysis:    Component Value Date/Time   COLORURINE AMBER (A) 01/19/2024 2158   APPEARANCEUR CLEAR 01/19/2024 2158   LABSPEC >1.046 (H) 01/19/2024 2158   PHURINE 6.0 01/19/2024 2158   GLUCOSEU NEGATIVE 01/19/2024 2158   HGBUR NEGATIVE 01/19/2024 2158   BILIRUBINUR NEGATIVE 01/19/2024 2158   KETONESUR NEGATIVE 01/19/2024 2158  PROTEINUR 30 (A) 01/19/2024 2158   NITRITE NEGATIVE 01/19/2024 2158   LEUKOCYTESUR NEGATIVE 01/19/2024 2158   Radiological Exams on Admission: DG Chest Portable 1 View Result Date: 01/27/2024 CLINICAL DATA:  Confusion EXAM: PORTABLE CHEST 1 VIEW COMPARISON:  X-ray 01/02/2024 FINDINGS: Underinflation. There is some linear opacity at the right lung base likely scar or atelectasis. No consolidation, pneumothorax or effusion. No edema. Overlapping cardiac leads. Normal cardiopericardial silhouette. IMPRESSION: Underinflation.  Right basilar scar or atelectasis. Electronically Signed   By: Ranell Bring M.D.   On: 01/27/2024 09:57   Data Reviewed: Relevant  notes from primary care and specialist visits, past discharge summaries as available in EHR, including Care Everywhere . Prior diagnostic testing as pertinent to current admission diagnoses, Updated medications and problem lists for reconciliation .ED course, including vitals, labs, imaging, treatment and response to treatment,Triage notes, nursing and pharmacy notes and ED provider's notes.Notable results as noted in HPI.Discussed case with EDMD/ ED APP/ or Specialty MD on call and as needed.  Assessment & Plan  >> Hepatic Encephalopathy: Most likely from grade 2 hepatic encephalopathy with disorientation and personality changes and appropriate behavior, D/d also include wernicke.  We will admit to PCU and monitor with neuro check q 4 hourly.  Lactulose  enema or po as tolerated  x 1. PCOT glucose q2-4 hours.  Thiamine  500 mg x 1 then 100 mg daily.  TFT:   Latest Reference Range & Units 12/25/23 19:12 12/25/23 19:13  TSH 0.350 - 4.500 uIU/mL 1.453   T4,Free(Direct) 0.61 - 1.12 ng/dL  9.23  A87/Qnojuz: 299 /  4.5 will recheck today.      >> Alcohol abuse: CIWA/ aspiration/ fall/ seizure precaution.  Pt states he drinks beer and last drink was few days ago. But he is disoriented and not sure how accurate this. When asked about where he is he says  heart disease.  >> Abnormal LFTs/ Hyperbilirubinemia/ hypoalbuminemia: Attribute to alcoholic cirrhosis as evidenced by elevated LFTs abnormal ultrasound low albumin , elevated INR, hepatic encephalopathy, mild ascites on abdomen on exam. GI consult as deemed appropriate.       >> Essential hypertension: PRN hydralazine  as pt is npo due to mental status.    Vitals:   01/27/24 0833 01/27/24 0845 01/27/24 0930 01/27/24 1015  BP: (!) 138/91 125/88 131/87 139/81   01/27/24 1345 01/27/24 1530 01/27/24 1615 01/27/24 1730  BP: (!) 129/94 111/83 (!) 110/92 112/71   01/27/24 1830  BP: 106/78  Hold home meds.    >> CAD/history of NSTEMI: NPO.   TNI check ordered.    >> Hypokalemia: Replace IV and follow.    >>Chronic hyponatremia: Stable we will start low rate ns at 20 cc/hour.         >> Decreased pedal pulses in both feet: ABI. Xray imaging as legs have bruising as well  both resolved. Suspect from edema which has improved and bruising on legs has resolved from my June admit with him .      DVT prophylaxis:  Heparin  Consults:  None  Advance Care Planning:    Code Status: Full Code   Family Communication:  Wife's mobile phone got stolen so she relayed her work phone number is [phone] and her home phone is [phone].   Disposition Plan:  To be determined Severity of Illness: The appropriate patient status for this patient is INPATIENT. Inpatient status is judged to be reasonable and necessary in order to provide the required intensity of service to ensure the  patient's safety. The patient's presenting symptoms, physical exam findings, and initial radiographic and laboratory data in the context of their chronic comorbidities is felt to place them at high risk for further clinical deterioration. Furthermore, it is not anticipated that the patient will be medically stable for discharge from the hospital within 2 midnights of admission.   * I certify that at the point of admission it is my clinical judgment that the patient will require inpatient hospital care spanning beyond 2 midnights from the point of admission due to high intensity of service, high risk for further deterioration and high frequency of surveillance required.*  Unresulted Labs (From admission, onward)     Start     Ordered   01/28/24 0500  Comprehensive metabolic panel  Tomorrow morning,   R        01/27/24 1236   01/28/24 0500  CBC  Tomorrow morning,   R        01/27/24 1236   01/27/24 1800  Comprehensive metabolic panel  Once,   R        01/27/24 1236            Meds ordered this encounter  Medications   sodium  chloride 0.9 % bolus 1,000 mL   potassium chloride  10 mEq in 100 mL IVPB   lactulose  (CHRONULAC ) 10 GM/15ML solution 30 g   potassium chloride  SA (KLOR-CON  M) CR tablet 40 mEq   thiamine  (VITAMIN B1) 500 mg in sodium chloride  0.9 % 50 mL IVPB   pantoprazole  (PROTONIX ) injection 40 mg   OR Linked Order Group    LORazepam  (ATIVAN ) tablet 1-4 mg     CIWA-AR < 5 =:   0 mg     CIWA-AR 5 -10 =:   1 mg     CIWA-AR 11 -15 =:   2 mg     CIWA-AR 16 -20 =:   3 mg     CIWA-AR 16 -20 =:   Recheck CIWA-AR in 1 hour; if > 20 notify MD     CIWA-AR > 20 =:   4 mg     CIWA-AR > 20 =:   Call Rapid Response    LORazepam  (ATIVAN ) injection 1-4 mg     CIWA-AR < 5 =:   0 mg     CIWA-AR 5 -10 =:   1 mg     CIWA-AR 11 -15 =:   2 mg     CIWA-AR 16 -20 =:   3 mg     CIWA-AR 16 -20 =:   Recheck CIWA-AR in 1 hour; if > 20 notify MD     CIWA-AR > 20 =:   4 mg     CIWA-AR > 20 =:   Call Rapid Response   OR Linked Order Group    thiamine  (VITAMIN B1) tablet 100 mg    thiamine  (VITAMIN B1) injection 100 mg   folic acid  (FOLVITE ) tablet 1 mg   multivitamin with minerals tablet 1 tablet   sodium chloride  flush (NS) 0.9 % injection 3 mL   OR Linked Order Group    acetaminophen  (TYLENOL ) tablet 650 mg    acetaminophen  (TYLENOL ) suppository 650 mg   sodium chloride  flush (NS) 0.9 % injection 3-10 mL   sodium chloride  flush (NS) 0.9 % injection 3-10 mL   heparin  injection 5,000 Units     Orders Placed This Encounter  Procedures   DG Chest Portable 1 View   CBC with Differential  Comprehensive metabolic panel   Ethanol   Ammonia   Magnesium    Comprehensive metabolic panel   CBC   Comprehensive metabolic panel   Folate   Diet NPO time specified   Vital signs every 6 hours X 48 hours, then per unit protocol   Refer to Sidebar Report for reference: ETOH Withdrawal Guidelines   Clinical Institute Withdrawal Assessent (CIWA)   If Ativan  given, reassess Clinical Institute Withdrawal Assessment (CIWA) with  blood pressure and pulse rate within 1 hour of Ativan  administration   Notify Pharmacy to change IV Ativan  to PO if tolerating POs well.   Notify physician (specify)   Maintain IV access   Vital signs   Notify physician (specify)   Mobility Protocol: No Restrictions RN to initiate protocols based on patient's level of care   Refer to Sidebar Report Refer to ICU, Med-Surg, Progressive, and Step-Down Mobility Protocol Sidebars   Initiate Adult Central Line Maintenance and Catheter Protocol for patients with central line (CVC, PICC, Port, Hemodialysis, Trialysis)   Daily weights   Intake and Output   Do not place and if present remove PureWick   Initiate Oral Care Protocol   Initiate Carrier Fluid Protocol   RN may order General Admission PRN Orders utilizing General Admission PRN medications (through manage orders) for the following patient needs: allergy symptoms (Claritin), cold sores (Carmex), cough (Robitussin DM), eye irritation (Liquifilm Tears), hemorrhoids (Tucks), indigestion (Maalox), minor skin irritation (Hydrocortisone Cream), muscle pain Lucienne Gay), nose irritation (saline nasal spray) and sore throat (Chloraseptic spray).   Cardiac Monitoring - Continuous Indefinite   Swallow screen   Full code   Consult to hospitalist   Consult to Transition of Care Team   Pulse oximetry check with vital signs   Oxygen therapy Mode or (Route): Nasal cannula; Liters Per Minute: 2; Keep O2 saturation between: greater than 92 %   ED EKG   EKG 12-Lead   Admit to Inpatient (patient's expected length of stay will be greater than 2 midnights or inpatient only procedure)   Aspiration precautions   Fall precautions    Author: Mario LULLA Blanch, MD 12 pm- 8 pm. [facility]. 01/27/2024 6:36 PM Please note for any communication after hours contact [facility] Assigned provider on call on Amion.

## 2024-01-27 NOTE — ED Notes (Signed)
 CCMD called.

## 2024-01-28 ENCOUNTER — Inpatient Hospital Stay (HOSPITAL_COMMUNITY)

## 2024-01-28 DIAGNOSIS — E876 Hypokalemia: Secondary | ICD-10-CM

## 2024-01-28 DIAGNOSIS — K7682 Hepatic encephalopathy: Secondary | ICD-10-CM | POA: Diagnosis not present

## 2024-01-28 HISTORY — PX: IR PARACENTESIS: IMG2679

## 2024-01-28 LAB — BODY FLUID CELL COUNT WITH DIFFERENTIAL
Eos, Fluid: 0 %
Lymphs, Fluid: 44 %
Monocyte-Macrophage-Serous Fluid: 48 % — ABNORMAL LOW (ref 50–90)
Neutrophil Count, Fluid: 8 % (ref 0–25)
Total Nucleated Cell Count, Fluid: 77 uL (ref 0–1000)

## 2024-01-28 LAB — PROTEIN, PLEURAL OR PERITONEAL FLUID: Total protein, fluid: <3 g/dL

## 2024-01-28 LAB — COMPREHENSIVE METABOLIC PANEL WITH GFR
ALT: 34 U/L (ref 0–44)
AST: 86 U/L — ABNORMAL HIGH (ref 15–41)
Albumin: <1.5 g/dL — ABNORMAL LOW (ref 3.5–5.0)
Alkaline Phosphatase: 125 U/L (ref 38–126)
Anion gap: 14 (ref 5–15)
BUN: <5 mg/dL — ABNORMAL LOW (ref 6–20)
CO2: 23 mmol/L (ref 22–32)
Calcium: 7.7 mg/dL — ABNORMAL LOW (ref 8.9–10.3)
Chloride: 96 mmol/L — ABNORMAL LOW (ref 98–111)
Creatinine, Ser: 0.86 mg/dL (ref 0.61–1.24)
GFR, Estimated: >60 mL/min (ref >60)
Glucose, Bld: 89 mg/dL (ref 70–99)
Potassium: 2.8 mmol/L — ABNORMAL LOW (ref 3.5–5.1)
Sodium: 133 mmol/L — ABNORMAL LOW (ref 135–145)
Total Bilirubin: 2.2 mg/dL — ABNORMAL HIGH (ref 0.0–1.2)
Total Protein: 5.9 g/dL — ABNORMAL LOW (ref 6.5–8.1)

## 2024-01-28 LAB — ALBUMIN, PLEURAL OR PERITONEAL FLUID: Albumin, Fluid: <1.5 g/dL

## 2024-01-28 LAB — CBC
HCT: 30.2 % — ABNORMAL LOW (ref 39.0–52.0)
Hemoglobin: 10.8 g/dL — ABNORMAL LOW (ref 13.0–17.0)
MCH: 32 pg (ref 26.0–34.0)
MCHC: 35.8 g/dL (ref 30.0–36.0)
MCV: 89.6 fL (ref 80.0–100.0)
Platelets: 267 K/uL (ref 150–400)
RBC: 3.37 MIL/uL — ABNORMAL LOW (ref 4.22–5.81)
RDW: 15.2 % (ref 11.5–15.5)
WBC: 7.7 K/uL (ref 4.0–10.5)
nRBC: 0.3 % — ABNORMAL HIGH (ref 0.0–0.2)

## 2024-01-28 LAB — GRAM STAIN: Gram Stain: NONE SEEN

## 2024-01-28 LAB — LACTATE DEHYDROGENASE, PLEURAL OR PERITONEAL FLUID: LD, Fluid: 101 U/L — ABNORMAL HIGH (ref 3–23)

## 2024-01-28 LAB — RAPID URINE DRUG SCREEN, HOSP PERFORMED
Amphetamines: NOT DETECTED
Barbiturates: NOT DETECTED
Benzodiazepines: POSITIVE — AB
Cocaine: NOT DETECTED
Opiates: NOT DETECTED
Tetrahydrocannabinol: POSITIVE — AB

## 2024-01-28 LAB — GLUCOSE, PLEURAL OR PERITONEAL FLUID: Glucose, Fluid: 126 mg/dL

## 2024-01-28 MED ORDER — PANTOPRAZOLE SODIUM 40 MG PO TBEC
40.0000 mg | DELAYED_RELEASE_TABLET | Freq: Two times a day (BID) | ORAL | Status: DC
  Administered 2024-01-28 – 2024-02-05 (×17): 40 mg via ORAL
  Filled 2024-01-28 (×17): qty 1

## 2024-01-28 MED ORDER — FUROSEMIDE 40 MG PO TABS
40.0000 mg | ORAL_TABLET | Freq: Every day | ORAL | Status: DC
  Administered 2024-01-28 – 2024-02-01 (×5): 40 mg via ORAL
  Filled 2024-01-28 (×5): qty 1

## 2024-01-28 MED ORDER — POTASSIUM CHLORIDE CRYS ER 20 MEQ PO TBCR
40.0000 meq | EXTENDED_RELEASE_TABLET | ORAL | Status: AC
  Administered 2024-01-28 (×2): 40 meq via ORAL
  Filled 2024-01-28 (×2): qty 2

## 2024-01-28 MED ORDER — LIDOCAINE HCL 1 % IJ SOLN
INTRAMUSCULAR | Status: AC
  Filled 2024-01-28: qty 20

## 2024-01-28 MED ORDER — LIDOCAINE HCL 1 % IJ SOLN
20.0000 mL | Freq: Once | INTRAMUSCULAR | Status: AC
Start: 2024-01-28 — End: 2024-01-28
  Administered 2024-01-28: 10 mL

## 2024-01-28 MED ORDER — LACTULOSE 10 GM/15ML PO SOLN
20.0000 g | Freq: Two times a day (BID) | ORAL | Status: DC
  Administered 2024-01-28 – 2024-01-30 (×5): 20 g via ORAL
  Filled 2024-01-28 (×5): qty 30

## 2024-01-28 MED ORDER — POTASSIUM CHLORIDE CRYS ER 20 MEQ PO TBCR
40.0000 meq | EXTENDED_RELEASE_TABLET | Freq: Once | ORAL | Status: AC
  Administered 2024-01-28: 40 meq via ORAL
  Filled 2024-01-28: qty 2

## 2024-01-28 MED ORDER — SPIRONOLACTONE 25 MG PO TABS
25.0000 mg | ORAL_TABLET | Freq: Every day | ORAL | Status: DC
  Administered 2024-01-28 – 2024-02-01 (×5): 25 mg via ORAL
  Filled 2024-01-28 (×5): qty 1

## 2024-01-28 NOTE — Progress Notes (Signed)
 [facility]                                                                              Jeffrey Hodges, is a 58 y.o. male, DOB - March 24, 1966, FMW:[id number] Admit date - 01/27/2024    Outpatient Primary MD for the patient is [name], NP  LOS - 1  days  No chief complaint on file.      Brief summary   Patient is a 58 year old male with CAD status post stent, history of syncope, history of high risk of falls, history of heavy alcohol abuse, anemia, AKI, depression anxiety, alcoholic hepatitis, hyperlipidemia, tobacco abuse, essential hypertension, history of metabolic encephalopathy suspicious for Wernicke's encephalopathy, history of DTs, chronic hyponatremia, brought to the emergency room via EMS patient was found crawling into the neighbors yard was confused similar episode happened on the 11th where he saw snake in the neighbors yard.  In ED, labs showed sodium 129, potassium 2.7, creatinine 1.1, AST 127, ALT 41, ammonia level 58, total bilirubin 2.4, CBC showed WBCs 5, AFP 12.9 on 30 October 2023  Assessment & Plan    Principal Problem: Acute metabolic encephalopathy, likely hepatic encephalopathy (HCC) - Also suspected Warnicke's encephalopathy. - Ammonia level 58 on admission, received lactulose , thiamine  500 mg x 1 - This morning, alert but still somewhat confused UDS positive for benzodiazepines, THC  History of alcohol abuse -States he drinks beer, continue CIWA with Ativan  protocol - Received thiamine  500 mg x 1, continue thiamine , folic acid    Transaminitis, hyperbilirubinemia, hypoalbuminemia alcoholic cirrhosis - CT abdomen pelvis on 7/7 had shown large volume ascites, cirrhotic morphology of the liver, 2 cm right posterior hepatic lobe hypodensity, hepatocellular carcinoma cannot be excluded, recommended MRI - Recent AFP 12.9 - Follows [facility], will consult - obtain MRI, paracentesis, rule out SBP - Resume Lasix  40 mg daily, Aldactone  25 mg  daily, lactulose  20 g twice daily   Hypokalemia - Replaced, obtain magnesium , phosphorus level  Hyponatremia -Likely due to hypervolemia, ascites - Improving     Estimated body mass index is 29.03 kg/m as calculated from the following:   Height as of this encounter: 6' 1 (1.854 m).   Weight as of this encounter: 99.8 kg.  Code Status: Full code DVT Prophylaxis:  heparin  injection 5,000 Units Start: 01/27/24 1400   Level of Care: Level of care: Telemetry Medical Family Communication: Updated patient Disposition Plan:      Remains inpatient appropriate:      Procedures:    Consultants:   GI  Antimicrobials:   Anti-infectives (From admission, onward)    None          Medications  atorvastatin   80 mg Oral Daily   busPIRone   10 mg Oral BID   cyanocobalamin   1,000 mcg Oral Daily   FLUoxetine   40 mg Oral Daily   folic acid   1 mg Oral Daily   heparin   5,000 Units Subcutaneous Q8H   multivitamin with minerals  1 tablet Oral Daily   nicotine   21 mg Transdermal Daily   pantoprazole   40 mg Oral BID   sodium chloride  flush  3 mL  Intravenous Q12H   sodium chloride  flush  3-10 mL Intravenous Q12H   tamsulosin   0.4 mg Oral Daily   thiamine   100 mg Oral Daily      Subjective:   Jeffrey Hodges was seen and examined today.  Alert and awake, oriented to self, denies any specific complaints, states no hallucinations currently.  Still shaky, no active nausea vomiting, fever chills, chest pain or shortness of breath.   Objective:   Vitals:   01/27/24 1830 01/27/24 2013 01/28/24 0620 01/28/24 0815  BP: 106/78 129/81 104/69 99/75  Pulse: 88 94 96 93  Resp: (!) 21 16 16 16   Temp: 98 F (36.7 C) 98.7 F (37.1 C) 97.6 F (36.4 C) 98.8 F (37.1 C)  TempSrc: Oral  Oral   SpO2: 98% 98% 97% 94%  Weight:      Height:        Intake/Output Summary (Last 24 hours) at 01/28/2024 1426 Last data filed at 01/28/2024 0700 Gross per 24 hour  Intake --  Output 300 ml  Net  -300 ml     Wt Readings from Last 3 Encounters:  01/27/24 99.8 kg  01/19/24 87.4 kg  01/07/24 91 kg     Exam General: Alert and oriented x self Cardiovascular: S1 S2 auscultated,  RRR Respiratory: Clear to auscultation bilaterally, no wheezing Gastrointestinal: Soft, nontender, nondistended, + bowel sounds Ext: 2+ pedal edema bilaterally Neuro: No new deficits Psych: Disoriented    Data Reviewed:  I have personally reviewed following labs    CBC Lab Results  Component Value Date   WBC 7.7 01/28/2024   RBC 3.37 (L) 01/28/2024   HGB 10.8 (L) 01/28/2024   HCT 30.2 (L) 01/28/2024   MCV 89.6 01/28/2024   MCH 32.0 01/28/2024   PLT 267 01/28/2024   MCHC 35.8 01/28/2024   RDW 15.2 01/28/2024   LYMPHSABS 1.5 01/27/2024   MONOABS 1.3 (H) 01/27/2024   EOSABS 0.0 01/27/2024   BASOSABS 0.0 01/27/2024     Last metabolic panel Lab Results  Component Value Date   NA 133 (L) 01/28/2024   K 2.8 (L) 01/28/2024   CL 96 (L) 01/28/2024   CO2 23 01/28/2024   BUN <5 (L) 01/28/2024   CREATININE 0.86 01/28/2024   GLUCOSE 89 01/28/2024   GFRNONAA >60 01/28/2024   CALCIUM  7.7 (L) 01/28/2024   PHOS 2.6 01/04/2024   PROT 5.9 (L) 01/28/2024   ALBUMIN  <1.5 (L) 01/28/2024   BILITOT 2.2 (H) 01/28/2024   ALKPHOS 125 01/28/2024   AST 86 (H) 01/28/2024   ALT 34 01/28/2024   ANIONGAP 14 01/28/2024    CBG (last 3)  No results for input(s): GLUCAP in the last 72 hours.    Coagulation Profile: No results for input(s): INR, PROTIME in the last 168 hours.   Radiology Studies: I have personally reviewed the imaging studies  DG Chest Portable 1 View Result Date: 01/27/2024 CLINICAL DATA:  Confusion EXAM: PORTABLE CHEST 1 VIEW COMPARISON:  X-ray 01/02/2024 FINDINGS: Underinflation. There is some linear opacity at the right lung base likely scar or atelectasis. No consolidation, pneumothorax or effusion. No edema. Overlapping cardiac leads. Normal cardiopericardial silhouette.  IMPRESSION: Underinflation.  Right basilar scar or atelectasis. Electronically Signed   By: Ranell Bring M.D.   On: 01/27/2024 09:57       Makyna Niehoff M.D. [facility] 01/28/2024, 2:26 PM  Available via Epic secure chat 7am-7pm After 7 pm, please refer to night coverage provider listed on amion.

## 2024-01-28 NOTE — Procedures (Signed)
 PROCEDURE SUMMARY:  Successful image-guided paracentesis from the right lower abdomen.  Yielded 7 liters of clear, yellow fluid.  No immediate complications.  EBL = trace. Patient tolerated well.   Specimen was sent for labs.  Please see imaging section of Epic for full dictation.   Lavanda JAYSON Jurist PA-C 01/28/2024 3:52 PM

## 2024-01-28 NOTE — Progress Notes (Signed)
 Mobility Specialist Progress Note:    01/28/24 1500  Mobility  Activity Ambulated with assistance to bathroom  Level of Assistance Minimal assist, patient does 75% or more  Assistive Device Other (Comment) (HHA)  Distance Ambulated (ft) 20 ft  Activity Response Tolerated well  Mobility Referral Yes  Mobility visit 1 Mobility  Mobility Specialist Start Time (ACUTE ONLY) 1440  Mobility Specialist Stop Time (ACUTE ONLY) 1455  Mobility Specialist Time Calculation (min) (ACUTE ONLY) 15 min   Pt received seated EOB w/ bed alarm going off. Pt requested assistance to the BR. Pt required MinA for STS and contact guard via HHA for ambulation. BM successful. Assisted back to bed w/ call bell and personal belongings in reach. All needs met. Bed alarm on.  Thersia Minder Mobility Specialist  Please contact vis Secure Chat or  Rehab Office [phone]

## 2024-01-28 NOTE — Telephone Encounter (Signed)
 Again attempted to reach patient's wife, Arland at work number provided. No answer, no voicemail left as this is a Clinical research associate message.  As previously noted, patient is currently hospitalized. Our hospital team has been advised that should GI be consulted, we would like MRI liver to be completed to rule out [facility] following a recent abnormal CT.

## 2024-01-29 ENCOUNTER — Inpatient Hospital Stay (HOSPITAL_COMMUNITY)

## 2024-01-29 DIAGNOSIS — E876 Hypokalemia: Secondary | ICD-10-CM | POA: Diagnosis not present

## 2024-01-29 DIAGNOSIS — R188 Other ascites: Secondary | ICD-10-CM

## 2024-01-29 DIAGNOSIS — K7682 Hepatic encephalopathy: Secondary | ICD-10-CM | POA: Diagnosis not present

## 2024-01-29 LAB — MISC LABCORP TEST (SEND OUT): Labcorp test code: 11254

## 2024-01-29 LAB — COMPREHENSIVE METABOLIC PANEL WITH GFR
ALT: 32 U/L (ref 0–44)
AST: 64 U/L — ABNORMAL HIGH (ref 15–41)
Albumin: <1.5 g/dL — ABNORMAL LOW (ref 3.5–5.0)
Alkaline Phosphatase: 135 U/L — ABNORMAL HIGH (ref 38–126)
Anion gap: 10 (ref 5–15)
BUN: 7 mg/dL (ref 6–20)
CO2: 22 mmol/L (ref 22–32)
Calcium: 7.7 mg/dL — ABNORMAL LOW (ref 8.9–10.3)
Chloride: 99 mmol/L (ref 98–111)
Creatinine, Ser: 1 mg/dL (ref 0.61–1.24)
GFR, Estimated: >60 mL/min (ref >60)
Glucose, Bld: 111 mg/dL — ABNORMAL HIGH (ref 70–99)
Potassium: 3.9 mmol/L (ref 3.5–5.1)
Sodium: 131 mmol/L — ABNORMAL LOW (ref 135–145)
Total Bilirubin: 1.4 mg/dL — ABNORMAL HIGH (ref 0.0–1.2)
Total Protein: 6 g/dL — ABNORMAL LOW (ref 6.5–8.1)

## 2024-01-29 LAB — CBC
HCT: 33.7 % — ABNORMAL LOW (ref 39.0–52.0)
Hemoglobin: 12.1 g/dL — ABNORMAL LOW (ref 13.0–17.0)
MCH: 32.4 pg (ref 26.0–34.0)
MCHC: 35.9 g/dL (ref 30.0–36.0)
MCV: 90.1 fL (ref 80.0–100.0)
Platelets: 223 K/uL (ref 150–400)
RBC: 3.74 MIL/uL — ABNORMAL LOW (ref 4.22–5.81)
RDW: 15.7 % — ABNORMAL HIGH (ref 11.5–15.5)
WBC: 11 K/uL — ABNORMAL HIGH (ref 4.0–10.5)
nRBC: 0 % (ref 0.0–0.2)

## 2024-01-29 LAB — PROTIME-INR
INR: 1.3 — ABNORMAL HIGH (ref 0.8–1.2)
Prothrombin Time: 16.8 s — ABNORMAL HIGH (ref 11.4–15.2)

## 2024-01-29 LAB — PH, BODY FLUID: pH, Body Fluid: 8

## 2024-01-29 LAB — PHOSPHORUS: Phosphorus: 2.8 mg/dL (ref 2.5–4.6)

## 2024-01-29 LAB — CYTOLOGY - NON PAP

## 2024-01-29 LAB — MAGNESIUM: Magnesium: 1.7 mg/dL (ref 1.7–2.4)

## 2024-01-29 MED ORDER — GADOBUTROL 1 MMOL/ML IV SOLN
10.0000 mL | Freq: Once | INTRAVENOUS | Status: AC | PRN
  Administered 2024-01-29: 10 mL via INTRAVENOUS

## 2024-01-29 MED ORDER — RIFAXIMIN 550 MG PO TABS
550.0000 mg | ORAL_TABLET | Freq: Two times a day (BID) | ORAL | Status: DC
  Administered 2024-01-29 – 2024-02-05 (×14): 550 mg via ORAL
  Filled 2024-01-29 (×14): qty 1

## 2024-01-29 NOTE — Progress Notes (Signed)
 [facility]                                                                              Edwing Figley, is a 58 y.o. male, DOB - [birth year], FMW:[id number] Admit date - 01/27/2024    Outpatient Primary MD for the patient is [name], NP  LOS - 2  days  No chief complaint on file.      Brief summary   Patient is a 58 year old male with CAD status post stent, history of syncope, history of high risk of falls, history of heavy alcohol abuse, anemia, AKI, depression anxiety, alcoholic hepatitis, hyperlipidemia, tobacco abuse, essential hypertension, history of metabolic encephalopathy suspicious for Wernicke's encephalopathy, history of DTs, chronic hyponatremia, brought to the emergency room via EMS patient was found crawling into the neighbors yard was confused similar episode happened on the 11th where he saw snake in the neighbors yard.  In ED, labs showed sodium 129, potassium 2.7, creatinine 1.1, AST 127, ALT 41, ammonia level 58, total bilirubin 2.4, CBC showed WBCs 5, AFP 12.9 on 30 October 2023  Assessment & Plan    Principal Problem: Acute metabolic encephalopathy, likely hepatic encephalopathy (HCC) - Also suspected Warnicke's encephalopathy. - Ammonia level 58 on admission, received lactulose , thiamine  500 mg x 1 - UDS positive for benzodiazepines, THC - Continue lactulose  20 g twice daily, titrate to 2-3 BMs in a day  History of alcohol abuse -States he drinks beer, continue CIWA with Ativan  protocol - Received thiamine  500 mg x 1, continue thiamine , folic acid  - Currently stable  Transaminitis, hyperbilirubinemia, severe hypoalbuminemia alcoholic cirrhosis - CT abdomen pelvis on 7/7 had shown large volume ascites, cirrhotic morphology of the liver, 2 cm right posterior hepatic lobe hypodensity, hepatocellular carcinoma cannot be excluded, recommended MRI - Recent AFP 12.9 -  Resumed Lasix  40 mg daily, Aldactone  25 mg daily, lactulose  20 g twice  daily - s/p paracentesis, on 7/16 7 L of ascitic fluid removed, studies negative for SBP - MRI liver showed no worrisome focal hepatic lesion, finding on the CT scan compatible with benign hemangioma.  Possible ileus or partial SBO - Patient is having BMs, hence SBO unlikely   Hypokalemia - Replace as needed   Hyponatremia -Likely due to hypervolemia, ascites   Generalized debility - PT OT eval, per wife at the bedside, requesting SNF for discharge  Estimated body mass index is 29.03 kg/m as calculated from the following:   Height as of this encounter: 6' 1 (1.854 m).   Weight as of this encounter: 99.8 kg.  Code Status: Full code DVT Prophylaxis:  heparin  injection 5,000 Units Start: 01/27/24 1400   Level of Care: Level of care: Telemetry Medical Family Communication: Updated patient's wife at the bedside Disposition Plan:      Remains inpatient appropriate:   [name] requested SNF at discharge   Procedures:    Consultants:   GI  Antimicrobials:   Anti-infectives (From admission, onward)    None          Medications  atorvastatin   80 mg Oral Daily   busPIRone   10 mg Oral BID  cyanocobalamin   1,000 mcg Oral Daily   FLUoxetine   40 mg Oral Daily   folic acid   1 mg Oral Daily   furosemide   40 mg Oral Daily   heparin   5,000 Units Subcutaneous Q8H   lactulose   20 g Oral BID   multivitamin with minerals  1 tablet Oral Daily   nicotine   21 mg Transdermal Daily   pantoprazole   40 mg Oral BID   sodium chloride  flush  3 mL Intravenous Q12H   sodium chloride  flush  3-10 mL Intravenous Q12H   spironolactone   25 mg Oral Daily   tamsulosin   0.4 mg Oral Daily   thiamine   100 mg Oral Daily      Subjective:   Trevian Hayashida was seen and examined today.  Alert and awake, mental status somewhat better from yesterday.  Oriented to self and place, recognizes his wife, not oriented with time.  No active nausea vomiting, states had 2 BMs yesterday evening.    Objective:   Vitals:   01/28/24 1701 01/28/24 2128 01/29/24 0605 01/29/24 0816  BP: 104/65 119/76 (!) 136/90 130/83  Pulse: (!) 102 (!) 103 (!) 110   Resp: 18 18 16    Temp: 97.9 F (36.6 C) 98 F (36.7 C) 97.6 F (36.4 C) 97.7 F (36.5 C)  TempSrc: Oral Oral Oral Oral  SpO2: 98% 96% 97% 98%  Weight:      Height:       No intake or output data in the 24 hours ending 01/29/24 1205    Wt Readings from Last 3 Encounters:  01/27/24 99.8 kg  01/19/24 87.4 kg  01/07/24 91 kg    Physical Exam General: Alert and oriented x 2 NAD Cardiovascular: S1 S2 clear, RRR.  Respiratory: CTAB, no wheezing, rales or rhonchi Gastrointestinal: Soft, nontender, nondistended, NBS Ext: 2+ pedal edema bilaterally Neuro: no new deficits Psych: Mental status improving   Data Reviewed:  I have personally reviewed following labs    CBC Lab Results  Component Value Date   WBC 11.0 (H) 01/29/2024   RBC 3.74 (L) 01/29/2024   HGB 12.1 (L) 01/29/2024   HCT 33.7 (L) 01/29/2024   MCV 90.1 01/29/2024   MCH 32.4 01/29/2024   PLT 223 01/29/2024   MCHC 35.9 01/29/2024   RDW 15.7 (H) 01/29/2024   LYMPHSABS 1.5 01/27/2024   MONOABS 1.3 (H) 01/27/2024   EOSABS 0.0 01/27/2024   BASOSABS 0.0 01/27/2024     Last metabolic panel Lab Results  Component Value Date   NA 131 (L) 01/29/2024   K 3.9 01/29/2024   CL 99 01/29/2024   CO2 22 01/29/2024   BUN 7 01/29/2024   CREATININE 1.00 01/29/2024   GLUCOSE 111 (H) 01/29/2024   GFRNONAA >60 01/29/2024   CALCIUM  7.7 (L) 01/29/2024   PHOS 2.8 01/29/2024   PROT 6.0 (L) 01/29/2024   ALBUMIN  <1.5 (L) 01/29/2024   BILITOT 1.4 (H) 01/29/2024   ALKPHOS 135 (H) 01/29/2024   AST 64 (H) 01/29/2024   ALT 32 01/29/2024   ANIONGAP 10 01/29/2024    CBG (last 3)  No results for input(s): GLUCAP in the last 72 hours.    Coagulation Profile: No results for input(s): INR, PROTIME in the last 168 hours.   Radiology Studies: I have personally  reviewed the imaging studies  MR LIVER W WO CONTRAST Result Date: 01/29/2024 CLINICAL DATA:  Liver lesion.  History of alcoholic hepatitis. EXAM: MRI ABDOMEN WITHOUT AND WITH CONTRAST TECHNIQUE: Multiplanar multisequence MR imaging of  the abdomen was performed both before and after the administration of intravenous contrast. CONTRAST:  10mL GADAVIST  GADOBUTROL  1 MMOL/ML IV SOLN COMPARISON:  CT abdomen 01/19/2024 FINDINGS: Despite efforts by the technologist and patient, motion artifact is present on today's exam and could not be eliminated. This reduces exam sensitivity and specificity. Lower chest: Bandlike atelectasis in both lower lobes. Uphill varices adjacent to the distal esophagus noted compatible with portal venous hypertension. Hepatobiliary: Contracted gallbladder demonstrating gallbladder wall thickening. A homogeneously T2 hyperintense lesion in the dome of the right hepatic lobe corresponding to the finding on CT scan measures 2.0 by 1.3 by 1.9 cm, and demonstrates progressive peripheral nodular centripetal enhancement most compatible with a benign hemangioma. This lesion has no early arterial phase enhancement. No other or worrisome focal hepatic lesion is identified. No biliary dilatation. Pancreas:  Unremarkable Spleen:  Unremarkable Adrenals/Urinary Tract:  Unremarkable Stomach/Bowel: Dilated loops of left abdominal small bowel up to 4.0 cm in diameter on image 16 series 3. Vascular/Lymphatic: Narrow proximal celiac trunk, narrowed proximal celiac trunk possibly related to median arcuate ligament. Questionable atheromatous narrowing of the proximal SMA although this was less striking on the recent CT scan. Other: Ascites. Mild patchy fluid infiltration of the upper omentum. Musculoskeletal: Unremarkable IMPRESSION: 1. The lesion in the dome of the right hepatic lobe corresponding to the finding on CT scan is most compatible with a benign hemangioma. No other or worrisome focal hepatic lesion is  identified. 2. Ascites. 3. Dilated loops of left abdominal small bowel up to 4.0 cm in diameter, possibly ileus or partial small bowel obstruction. 4. Upstream narrowing of the celiac trunk possibly related to median arcuate ligament. Questionable atheromatous narrowing of the proximal SMA although this was less striking on the recent CT scan. 5. Bandlike atelectasis in both lower lobes. Electronically Signed   By: Ryan Salvage M.D.   On: 01/29/2024 11:35   IR Paracentesis Result Date: 01/28/2024 INDICATION: 58 year old male with hepatic encephalopathy and recurrent ascites for diagnostic and therapeutic paracentesis. EXAM: ULTRASOUND GUIDED RIGHT PARACENTESIS MEDICATIONS: 1% lidocaine  10 mL COMPLICATIONS: None immediate. PROCEDURE: Informed written consent was obtained from the patient after a discussion of the risks, benefits and alternatives to treatment. A timeout was performed prior to the initiation of the procedure. Initial ultrasound scanning demonstrates a large amount of ascites within the right lower abdominal quadrant. The right lower abdomen was prepped and draped in the usual sterile fashion. 1% lidocaine  was used for local anesthesia. Following this, a 19 gauge, 7-cm, Yueh catheter was introduced. An ultrasound image was saved for documentation purposes. The paracentesis was performed. The catheter was removed and a dressing was applied. The patient tolerated the procedure well without immediate post procedural complication. Patient received post-procedure intravenous albumin ; see nursing notes for details. FINDINGS: A total of approximately 7 L of clear, yellow fluid was removed. Samples were sent to the laboratory as requested by the clinical team. IMPRESSION: Successful ultrasound-guided paracentesis yielding 7 liters of peritoneal fluid. PLAN: Performed By Lavanda Jurist, PA-C Electronically Signed   By: Juliene Balder M.D.   On: 01/28/2024 16:39       Jashayla Glatfelter M.D. [facility] 01/29/2024, 12:05 PM  Available via Epic secure chat 7am-7pm After 7 pm, please refer to night coverage provider listed on amion.

## 2024-01-29 NOTE — Evaluation (Signed)
 Occupational Therapy Evaluation Patient Details Name: Jeffrey Hodges MRN: [id number] DOB: April 24, 1966 Today's Date: 01/29/2024   History of Present Illness   58 y.o. male admitted 7/15 with acute metabolic encephalopathy, likely hepatic encephalopathy. Also suspected Warnicke's encephalopathy. PMH: heart disease status post stent, heavy alcohol abuse, anemia, AKI, depression anxiety, elevated LFTs, hyperlipidemia, tobacco abuse, essential hypertension.     Clinical Impressions Pt resting in bed comfortably, pleasantly confused, poor historian, oriented to self knows he is at a hospital. Pt unable to provide accurate history, gathered from prior admissions. PLOF frequent falls but mod I with RW overall. At this time Pt min A for ambulation, poor balance and safety awareness, not aware of deficits, set up/supervision for ADLs. Pt continually said he had to call someone but when asked who he said no one, and he didn't know how to work his phone. Recommending postacute rehab <3hrs/day to improve functional independence with ADLs/mobility and safety awareness, will continue to follow acutely to progress as able.      If plan is discharge home, recommend the following:   A little help with walking and/or transfers;A little help with bathing/dressing/bathroom;Assistance with cooking/housework;Assist for transportation;Help with stairs or ramp for entrance     Functional Status Assessment   Patient has had a recent decline in their functional status and demonstrates the ability to make significant improvements in function in a reasonable and predictable amount of time.     Equipment Recommendations   Other (comment) (defer)     Recommendations for Other Services         Precautions/Restrictions   Precautions Precautions: Fall Recall of Precautions/Restrictions: Impaired Precaution/Restrictions Comments: Bowel incontinence Restrictions Weight Bearing Restrictions Per Provider  Order: No     Mobility Bed Mobility Overal bed mobility: Needs Assistance Bed Mobility: Supine to Sit, Sit to Supine     Supine to sit: Supervision Sit to supine: Supervision   General bed mobility comments: supervision for safety    Transfers Overall transfer level: Needs assistance Equipment used: Rolling walker (2 wheels) Transfers: Sit to/from Stand, Bed to chair/wheelchair/BSC Sit to Stand: Min assist     Step pivot transfers: Min assist     General transfer comment: min A to maintain balance, somewhat unsteady during ambulation around room.      Balance Overall balance assessment: Needs assistance Sitting-balance support: No upper extremity supported, Feet supported Sitting balance-Leahy Scale: Good Sitting balance - Comments: EOB ADLs   Standing balance support: Bilateral upper extremity supported, During functional activity, Reliant on assistive device for balance Standing balance-Leahy Scale: Poor Standing balance comment: reliant on RW for support today                           ADL either performed or assessed with clinical judgement   ADL Overall ADL's : Needs assistance/impaired Eating/Feeding: Set up;Sitting   Grooming: Set up;Supervision/safety;Sitting   Upper Body Bathing: Set up;Supervision/ safety;Sitting   Lower Body Bathing: Set up;Contact guard assist;Sitting/lateral leans;Sit to/from stand   Upper Body Dressing : Set up;Supervision/safety;Sitting   Lower Body Dressing: Set up;Contact guard assist;Sitting/lateral leans;Sit to/from stand   Toilet Transfer: Minimal assistance;Rolling walker (2 wheels)   Toileting- Clothing Manipulation and Hygiene: Set up;Contact guard assist;Sitting/lateral lean;Sit to/from stand       Functional mobility during ADLs: Minimal assistance;Rolling walker (2 wheels) General ADL Comments: Pt overall set up,supervision, min A for standing/ambulation with RW, poor balance and safety awareness.  Vision Baseline Vision/History: 1 Wears glasses Ability to See in Adequate Light: 0 Adequate Patient Visual Report: No change from baseline       Perception         Praxis         Pertinent Vitals/Pain Pain Assessment Pain Assessment: No/denies pain     Extremity/Trunk Assessment Upper Extremity Assessment Upper Extremity Assessment: Overall WFL for tasks assessed;RUE deficits/detail;LUE deficits/detail RUE Deficits / Details: overall WFLs, significant bruising and scabbing to BL forearms LUE Deficits / Details: overall WFLs, significant bruising and scabbing to BL forearms   Lower Extremity Assessment Lower Extremity Assessment: Difficult to assess due to impaired cognition   Cervical / Trunk Assessment Cervical / Trunk Assessment: Normal   Communication Communication Communication: No apparent difficulties   Cognition Arousal: Alert Behavior During Therapy: Flat affect Cognition: Cognition impaired   Orientation impairments: Time, Situation         OT - Cognition Comments: Pt pleasantly confused, not aware of date or situation, poor historian. Pt able to follow commands fairly consistently but is not aware of deficits.                 Following commands: Impaired Following commands impaired: Only follows one step commands consistently, Follows one step commands with increased time     Cueing  General Comments   Cueing Techniques: Verbal cues;Gestural cues  Assisted pt with finding his glasses due to complaint of loosing them. he is now wearing them and looking through his phone.   Exercises     Shoulder Instructions      Home Living Family/patient expects to be discharged to:: Private residence Living Arrangements: Spouse/significant other Available Help at Discharge: Family;Available 24 hours/day Type of Home: House Home Access: Stairs to enter [employer] of Steps: 3 Entrance Stairs-Rails: Right;Left Home Layout: Two  level Alternate Level Stairs-Number of Steps: 13 Alternate Level Stairs-Rails: Left Bathroom Shower/Tub: Tub/shower unit;Walk-in shower   Bathroom Toilet: Standard Bathroom Accessibility: Yes   Home Equipment: Shower seat - built in   Additional Comments: Unconfirmed - info taken from prior admission - pt poor historian      Prior Functioning/Environment Prior Level of Function : Independent/Modified Independent;History of Falls (last six months)             Mobility Comments: ind, multiple falls ADLs Comments: relies on wife for housekeeping, meal prep    OT Problem List: Decreased strength;Decreased activity tolerance;Impaired balance (sitting and/or standing);Decreased cognition;Decreased safety awareness;Decreased knowledge of use of DME or AE   OT Treatment/Interventions: Self-care/ADL training;DME and/or AE instruction;Therapeutic activities;Cognitive remediation/compensation;Patient/family education;Balance training;Therapeutic exercise      OT Goals(Current goals can be found in the care plan section)   Acute Rehab OT Goals Patient Stated Goal: unable to participate in setting goals OT Goal Formulation: Patient unable to participate in goal setting Time For Goal Achievement: 02/12/24 Potential to Achieve Goals: Good   OT Frequency:  Min 2X/week    Co-evaluation              AM-PAC OT 6 Clicks Daily Activity     Outcome Measure Help from another person eating meals?: None Help from another person taking care of personal grooming?: A Little Help from another person toileting, which includes using toliet, bedpan, or urinal?: A Little Help from another person bathing (including washing, rinsing, drying)?: A Little Help from another person to put on and taking off regular upper body clothing?: A Little Help from another person to put on  and taking off regular lower body clothing?: A Little 6 Click Score: 19   End of Session Equipment Utilized During  Treatment: Gait belt;Rolling walker (2 wheels) Nurse Communication: Mobility status  Activity Tolerance: Patient tolerated treatment well Patient left: in bed;with call bell/phone within reach;with bed alarm set  OT Visit Diagnosis: Unsteadiness on feet (R26.81);Other abnormalities of gait and mobility (R26.89);Other symptoms and signs involving cognitive function;Muscle weakness (generalized) (M62.81);History of falling (Z91.81)                Time: 8388-8370 OT Time Calculation (min): 18 min Charges:  OT General Charges $OT Visit: 1 Visit OT Evaluation $OT Eval Moderate Complexity: 1 [address] OTR/L   Jeffrey Hodges 01/29/2024, 4:45 PM

## 2024-01-29 NOTE — Evaluation (Signed)
 Physical Therapy Evaluation Patient Details Name: Jeffrey Hodges MRN: [id number] DOB: [birth year] Today's Date: 01/29/2024  History of Present Illness  58 y.o. male admitted 7/15 with acute metabolic encephalopathy, likely hepatic encephalopathy. Also suspected Warnicke's encephalopathy. PMH: heart disease status post stent, heavy alcohol abuse, anemia, AKI, depression anxiety, elevated LFTs, hyperlipidemia, tobacco abuse, essential hypertension.  Clinical Impression  Pt admitted with above diagnosis. Poor historian, very confused and tangential but pleasant and agreeable to therapy visit. Pt required min assist for balance with transfer, CGA for gait with RW to steady. Poor safety awareness. Hx obtained from prior admission. We are familiar with pt from previous visit. Currently recommend continued inpatient follow up therapy, <3 hours/day. If cognition clears and pt can safely mobilize may update recs. Appears that he did well at SNF after prior d/c but has now had a set back with this encephalopathy. Pt currently with functional limitations due to the deficits listed below (see PT Problem List). Pt will benefit from acute skilled PT to increase their independence and safety with mobility to allow discharge.           If plan is discharge home, recommend the following: Assistance with cooking/housework;Direct supervision/assist for medications management;Direct supervision/assist for financial management;Help with stairs or ramp for entrance;Assist for transportation;Supervision due to cognitive status;A little help with walking and/or transfers;A little help with bathing/dressing/bathroom   Can travel by private vehicle   Yes    Equipment Recommendations Other (comment) (defer)  Recommendations for Other Services       Functional Status Assessment Patient has had a recent decline in their functional status and demonstrates the ability to make significant improvements in function in a  reasonable and predictable amount of time.     Precautions / Restrictions Precautions Precautions: Fall Recall of Precautions/Restrictions: Impaired Precaution/Restrictions Comments: Bowel incontinence Restrictions Weight Bearing Restrictions Per Provider Order: No      Mobility  Bed Mobility Overal bed mobility: Needs Assistance Bed Mobility: Supine to Sit, Sit to Supine   Sidelying to sit: Supervision Supine to sit: Supervision     General bed mobility comments: Supervision for safety, no physical assist required.    Transfers Overall transfer level: Needs assistance Equipment used: Rolling walker (2 wheels) Transfers: Sit to/from Stand Sit to Stand: Min assist           General transfer comment: Light min assist for balance, fair strength to power up. Cues for hand placement and RW use to steady himself.    Ambulation/Gait Ambulation/Gait assistance: Contact guard assist Gait Distance (Feet): 100 Feet Assistive device: Rolling walker (2 wheels) Gait Pattern/deviations: Step-through pattern, Decreased stride length, Drifts right/left, Wide base of support Gait velocity: dec Gait velocity interpretation: 1.31 - 2.62 ft/sec, indicative of limited community ambulator   General Gait Details: Minor instability with RW for support. fair pace with cues for safety and awareness. A little erratic with RW but without overt LOB during bout. Requires cues to remain focused on task at hand and to return to room.  Stairs            Wheelchair Mobility     Tilt Bed    Modified Rankin (Stroke Patients Only)       Balance Overall balance assessment: Needs assistance Sitting-balance support: No upper extremity supported, Feet supported Sitting balance-Leahy Scale: Good Sitting balance - Comments: no LOB while EOB   Standing balance support: Bilateral upper extremity supported, Reliant on assistive device for balance Standing balance-Leahy Scale: Poor Standing  balance comment: reliant on RW for support today                             Pertinent Vitals/Pain Pain Assessment Pain Assessment: No/denies pain    Home Living Family/patient expects to be discharged to:: Private residence Living Arrangements: Spouse/significant other Available Help at Discharge: Family;Available 24 hours/day Type of Home: House Home Access: Stairs to enter Entrance Stairs-Rails: Doctor, general practice of Steps: 3 Alternate Level Stairs-Number of Steps: 13 Home Layout: Two level Home Equipment: Shower seat - built in Additional Comments: Unconfirmed - info taken from prior admission - pt poor historian    Prior Function Prior Level of Function : Independent/Modified Independent;History of Falls (last six months)             Mobility Comments: ind, multiple falls ADLs Comments: relies on wife for housekeeping, meal prep     Extremity/Trunk Assessment   Upper Extremity Assessment Upper Extremity Assessment: Defer to OT evaluation    Lower Extremity Assessment Lower Extremity Assessment: Difficult to assess due to impaired cognition    Cervical / Trunk Assessment Cervical / Trunk Assessment: Normal  Communication   Communication Communication: No apparent difficulties    Cognition Arousal: Alert Behavior During Therapy: Flat affect   PT - Cognitive impairments: Awareness, Memory, Attention, Initiation, Sequencing, Problem solving, Safety/Judgement, Orientation                       PT - Cognition Comments: Confused, tangential, discussing things off topic to questions asked during evaluation. Following commands: Impaired Following commands impaired: Follows one step commands with increased time, Only follows one step commands consistently     Cueing Cueing Techniques: Verbal cues, Gestural cues     General Comments General comments (skin integrity, edema, etc.): Assisted pt with finding his glasses due to  complaint of loosing them. he is now wearing them and looking through his phone.    Exercises     Assessment/Plan    PT Assessment Patient needs continued PT services  PT Problem List Decreased strength;Decreased range of motion;Decreased activity tolerance;Decreased balance;Decreased mobility;Decreased cognition;Decreased coordination;Decreased knowledge of use of DME;Decreased knowledge of precautions;Decreased safety awareness;Cardiopulmonary status limiting activity       PT Treatment Interventions DME instruction;Stair training;Gait training;Functional mobility training;Therapeutic exercise;Therapeutic activities;Balance training;Neuromuscular re-education;Cognitive remediation;Patient/family education    PT Goals (Current goals can be found in the Care Plan section)  Acute Rehab PT Goals Patient Stated Goal: None stated PT Goal Formulation: Patient unable to participate in goal setting Time For Goal Achievement: 02/12/24 Potential to Achieve Goals: Fair    Frequency Min 2X/week     Co-evaluation               AM-PAC PT 6 Clicks Mobility  Outcome Measure Help needed turning from your back to your side while in a flat bed without using bedrails?: None Help needed moving from lying on your back to sitting on the side of a flat bed without using bedrails?: A Little Help needed moving to and from a bed to a chair (including a wheelchair)?: A Little Help needed standing up from a chair using your arms (e.g., wheelchair or bedside chair)?: A Little Help needed to walk in hospital room?: A Little Help needed climbing 3-5 steps with a railing? : A Lot 6 Click Score: 18    End of Session Equipment Utilized During Treatment: Gait belt Activity Tolerance: Patient tolerated  treatment well Patient left: with call bell/phone within reach;in bed;with bed alarm set   PT Visit Diagnosis: Unsteadiness on feet (R26.81);Other abnormalities of gait and mobility (R26.89);Other  symptoms and signs involving the nervous system (R29.898);History of falling (Z91.81);Muscle weakness (generalized) (M62.81);Repeated falls (R29.6)    Time: 8446-8396 PT Time Calculation (min) (ACUTE ONLY): 10 min   Charges:   PT Evaluation $PT Eval Low Complexity: 1 Low   PT General Charges $$ ACUTE PT VISIT: 1 Visit         Leontine Roads, PT, DPT [facility]  Rehabilitation Services Physical Therapist Office: [phone] Website: [email or web address]   Leontine GORMAN Roads 01/29/2024, 4:16 PM

## 2024-01-29 NOTE — Consult Note (Signed)
 Consultation  Referring Provider: TRH/ Rai Primary Care Physician:  [name], NP Primary Gastroenterologist:  Dr.Cunningham  Reason for Consultation: Altered mental status, confusion, hallucinations setting of heavy EtOH use  HPI: Jeffrey Hodges is a 58 y.o. male with history of coronary artery disease status post stent 2024, chronic EtOH abuse, depression, anxiety, previous history of alcoholic hepatitis, hypertension and recent admission in June 2025 for altered mental status and abnormal behavior.  Per those notes patient had denied active EtOH use but his family reported that he was drinking 2 bottles of liquor per day.  Suspected that his symptoms were secondary to hepatic encephalopathy, also some Pichon for Warnicke's encephalopathy.  He did go through DTs during that admission.  He was managed with high-dose IV thiamine  CIWA protocol and Librium .  He was discharged home on lactulose  30 cc twice daily. He underwent MRI of the brain and EEG during that admission, had stable B12 level. MRI of the brain was read as motion degraded with mild chronic microvascular ischemic changes no acute abnormality. EEG showed generalized irregular slow activity still with nonspecific cerebral dysfunction/encephalopathy no seizure activity. Patient also has history of depression and anxiety, he had been on BuSpar  which was discontinued and he was continued on Prozac  at the time of discharge.  From a liver perspective, patient had only been seen in our office once by Dr. Stacia and workup was in progress for suspicion for underlying cirrhosis.  He was supposed to have a an elastography which did not get done and was to come back for EGD etc.  CT of the abdomen and pelvis on 01/19/2024 showed questionable nodular hepatic contour and a 2 cm right posterior hepatic lobe hypodensity no biliary dilation, it was noted to have a large volume of ascites.  Patient was brought back to the emergency room on  01/27/2024 after he was found crawling in the neighbors yard confused.  Apparently a similar episode had happened about 4 days prior to that where he said he saw a snake in the neighbors yard. He has remained confused since admission Ammonia level was 58 on admission Sodium 132/potassium 2.9/BUN less than 5/creatinine 1.01 Albumin  less than 1.5 Total bilirubin 2.3/alk phos 129/AST 104/ALT 37 WBC 7.7/hemoglobin 10.8/hematocrit 30.2/platelets 267 Drug screen was positive for THC and benzodiazepines on admission.  He did undergo paracentesis yesterday with removal of 7 L, cell counts are not consistent with SBP, Gram stain negative, cytology pending..  On interview today, patient cannot tell me the name of the hospital, relates that he thinks someone on the staff is mad at him because he went for 4 MRIs earlier today. When asked why he was brought to the hospital he says that he saw someone breaking into the neighbors house so he had gone over to their house and knocked on the door, he cannot tell the rest of the story but apparently police showed up and he was brought here.  He said that happened another time recently when he had seen someone trying to break into one of the other neighbors yards.  He says he was just trying to be helpful. Denies any EtOH use over the past 6 months.  No family available in the room today.  He has no active GI symptoms.      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 Authorizing Provider  aspirin  EC 81 MG tablet Take 1 tablet (81 mg total) by mouth daily. Swallow whole. Patient taking differently: Take 81 mg by mouth at bedtime. Swallow whole. 09/20/22   Daneen Damien BROCKS, NP  atorvastatin  (LIPITOR) 80 MG tablet Take 1 tablet (80 mg total) by mouth daily. 09/20/22   Daneen Damien BROCKS, NP  busPIRone  (BUSPAR ) 10 MG tablet Take 1 tablet (10 mg total) by mouth 2 (two) times daily. 01/21/24   [name], NP  cyanocobalamin  (VITAMIN B12) 1000 MCG tablet Take 1 tablet (1,000 mcg total) by mouth daily. 10/27/23   Stacia Glendia BRAVO, MD  FLUoxetine  (PROZAC ) 40 MG capsule Take 1 capsule (40 mg total) by mouth daily. 01/21/24   [name], NP  folic acid  (FOLVITE ) 1 MG tablet Take 1 tablet (1 mg total) by mouth daily. 10/27/23   Stacia Glendia BRAVO, MD  furosemide  (LASIX ) 40 MG tablet Take 1 tablet (40 mg total) by mouth 2 (two) times daily. 01/20/24 01/19/25  [name], NP  lactulose  (CHRONULAC ) 10 GM/15ML solution Take 30 mLs (20 g total) by mouth 2 (two) times daily. 01/20/24 02/19/24  [name], NP  naltrexone  (DEPADE) 50 MG tablet Take 50 mg by mouth daily. 01/22/24   [provider]  nicotine  (NICODERM CQ  - DOSED IN MG/24 HOURS) 21 mg/24hr patch Place 1 patch (21 mg total) onto the skin daily. 01/06/24   Singh, Prashant K, MD  nitroGLYCERIN  (NITROSTAT ) 0.4 MG SL tablet Place 1 tablet (0.4 mg total) under the tongue every 5 (five) minutes x 3 doses as needed for chest pain. 09/20/22   Daneen Damien BROCKS, NP  pantoprazole  (PROTONIX ) 40 MG tablet Take 1 tablet (40 mg total) by mouth daily. 01/06/24   Singh, Prashant K, MD  potassium chloride  (KLOR-CON ) 10 MEQ tablet Take 1 tablet (10 mEq total) by mouth daily. 01/23/24   Suzette Pac,  MD  spironolactone  (ALDACTONE ) 25 MG tablet Take 1 tablet (25 mg total) by mouth daily. 01/20/24 02/19/24  [name], NP  tamsulosin  (FLOMAX ) 0.4 MG CAPS capsule Take 1 capsule (0.4 mg total) by mouth daily. 01/06/24   Singh, Prashant K, MD  thiamine  (VITAMIN B1) 100 MG tablet Take 1 tablet (100 mg total) by mouth daily. 01/06/24   Singh, Prashant K, MD  Vitamin D , Ergocalciferol , (DRISDOL ) 1.25 MG (50000 UNIT) CAPS capsule X 24 weeks Patient taking differently: Take 50,000 Units by mouth every 7 (seven) days. 10/27/23   Stacia Glendia BRAVO, MD    Current Facility-Administered Medications  Medication Dose Route Frequency Provider Last Rate Last Admin   acetaminophen  (TYLENOL ) tablet 650 mg  650 mg Oral Q6H PRN Patel, Ekta V, MD   650 mg at 01/27/24 2034   Or   acetaminophen  (TYLENOL ) suppository 650 mg  650 mg Rectal Q6H PRN  Tobie Mario GAILS, MD       atorvastatin  (LIPITOR) tablet 80 mg  80 mg Oral Daily Tobie Mario GAILS, MD   80 mg at 01/29/24 0935   busPIRone  (BUSPAR ) tablet 10 mg  10 mg Oral BID Patel, Ekta V, MD   10 mg at 01/29/24 9063   cyanocobalamin  (VITAMIN B12) tablet 1,000 mcg  1,000 mcg Oral Daily Tobie Mario GAILS, MD   1,000 mcg at 01/29/24 9063   FLUoxetine  (PROZAC ) capsule 40 mg  40 mg Oral Daily Patel, Ekta V, MD   40 mg at 01/29/24 9063   folic acid  (FOLVITE ) tablet 1 mg  1 mg Oral Daily Tobie Mario GAILS, MD   1 mg at 01/29/24 0935   furosemide  (LASIX ) tablet 40 mg  40 mg Oral Daily Rai, Ripudeep K, MD   40 mg at 01/29/24 9062   heparin  injection 5,000 Units  5,000 Units Subcutaneous Q8H Tobie Mario GAILS, MD   5,000 Units at 01/29/24 0606   lactulose  (CHRONULAC ) 10 GM/15ML solution 20 g  20 g Oral BID Rai, Ripudeep K, MD   20 g at 01/29/24 9062   LORazepam  (ATIVAN ) tablet 1-4 mg  1-4 mg Oral Q1H PRN Patel, Ekta V, MD   1 mg at 01/27/24 2034   Or   LORazepam  (ATIVAN ) injection 1-4 mg  1-4 mg Intravenous Q1H PRN Patel, Ekta V, MD       multivitamin with minerals tablet 1 tablet  1 tablet Oral  Daily Tobie Mario GAILS, MD   1 tablet at 01/29/24 9063   nicotine  (NICODERM CQ  - dosed in mg/24 hours) patch 21 mg  21 mg Transdermal Daily Tobie Mario GAILS, MD   21 mg at 01/29/24 9062   pantoprazole  (PROTONIX ) EC tablet 40 mg  40 mg Oral BID Rai, Ripudeep K, MD   40 mg at 01/29/24 0935   sodium chloride  flush (NS) 0.9 % injection 3 mL  3 mL Intravenous Q12H Tobie Mario V, MD   3 mL at 01/29/24 0943   sodium chloride  flush (NS) 0.9 % injection 3-10 mL  3-10 mL Intravenous Q12H Tobie Mario V, MD   10 mL at 01/28/24 2131   sodium chloride  flush (NS) 0.9 % injection 3-10 mL  3-10 mL Intravenous PRN Tobie Mario GAILS, MD       spironolactone  (ALDACTONE ) tablet 25 mg  25 mg Oral Daily Rai, Ripudeep K, MD   25 mg at 01/29/24 9062   tamsulosin  (FLOMAX ) capsule 0.4 mg  0.4 mg Oral Daily Patel, Ekta V, MD   0.4 mg at 01/29/24 9063   thiamine  (VITAMIN B1) tablet 100 mg  100 mg Oral Daily Tobie Mario GAILS, MD   100 mg at 01/29/24 0936    Allergies as of 01/27/2024   (No Known Allergies)    Family History  Problem Relation Age of Onset   CAD Neg Hx    CVA Neg Hx    Diabetes Neg Hx     Social History   Socioeconomic History   Marital status: Married    Spouse name: Not on file   Number of children: 4   Years of education: Not on file   Highest education level: GED or equivalent  Occupational History   Not on file  Tobacco Use   Smoking status: Former    Types: Cigarettes    Passive exposure: Never   Smokeless tobacco: Never  Vaping Use   Vaping status: Every Day   Substances: Nicotine , Flavoring  Substance and Sexual Activity   Alcohol use: Yes   Drug use: Yes    Types: Marijuana   Sexual activity: Yes  Other Topics Concern   Not on file  Social History Narrative   Fulltime: [employer]      Hobbies: like to play guitars   Social Drivers of Health   Financial Resource Strain: Medium Risk (03/24/2023)   Overall Financial Resource Strain (CARDIA)    Difficulty of Paying Living Expenses:  Somewhat hard  Food Insecurity: No Food Insecurity (12/26/2023)   Hunger Vital Sign    Worried About Running Out of Food in the Last Year: Never true    Ran Out of Food in the Last Year: Never true  Transportation Needs: No Transportation Needs (12/26/2023)   PRAPARE - Administrator, Civil Service (Medical): No    Lack of Transportation (Non-Medical): No  Physical Activity: Not on file  Stress: Stress Concern Present (03/24/2023)   [employer] of Occupational Health - Occupational Stress Questionnaire    Feeling of Stress : Very much  Social Connections: Unknown (03/24/2023)   Social Connection and Isolation Panel    Frequency of Communication with Friends and Family: Not on file    Frequency of Social Gatherings with Friends and Family: Never    Attends Religious Services: Never    Database administrator or Organizations: No    Attends Engineer, structural: Not on file    Marital Status: Married  Catering manager Violence: Not At Risk (12/26/2023)   Humiliation, Afraid, Rape, and Kick questionnaire    Fear of Current or Ex-Partner: No    Emotionally Abused: No    Physically Abused: No    Sexually Abused: No    Review of Systems: Pertinent positive and negative review of systems were noted in the above HPI section.  All other review of systems was otherwise negative.   Physical Exam: Vital signs in last 24 hours: Temp:  [97.6 F (36.4 C)-98 F (36.7 C)] 97.7 F (36.5 C) (07/17 0816) Pulse Rate:  [102-110] 110 (07/17 0605) Resp:  [16-18] 16 (07/17 0605) BP: (97-136)/(65-90) 130/83 (07/17 0816) SpO2:  [96 %-98 %] 98 % (07/17 0816) Last BM Date : 01/27/24 General:   Alert,  Well-developed, thin, somewhat disheveled appearing white male, pleasant and cooperative in NAD, pleasant but confused Head:  Normocephalic and atraumatic. Eyes:  Sclera clear, no icterus.   Conjunctiva pink. Ears:  Normal auditory acuity. Nose:  No deformity, discharge,  or  lesions. Mouth:  No deformity or lesions.   Neck:  Supple; no masses or thyromegaly. Lungs:  Clear throughout to auscultation.   No wheezes, crackles, or rhonchi.  Heart:  Regular rate and rhythm; no murmurs, clicks, rubs,  or gallops. Abdomen:  Soft,nontender, BS active,nonpalp mass or hsm.  Nontense ascites Rectal: Not done Msk:  Symmetrical without gross deformities. . Pulses:  Normal pulses noted. Extremities:  Without clubbing or edema. Neurologic:  Alert and  oriented x 1-no tremors, no asterixis disoriented to place (unable to tell me the name of this hospital), asked what year it was he said 10/17 something and was unable to come up with a year Skin:  Intact without significant lesions or rashes.. Psych:  Alert and cooperative, confused  Intake/Output from previous day: No intake/output data recorded. Intake/Output this shift: No intake/output data recorded.  Lab Results: Recent Labs    01/27/24 0842 01/28/24 0529 01/29/24 0615  WBC 11.0* 7.7 11.0*  HGB 11.3*  10.8* 12.1*  HCT 32.1* 30.2* 33.7*  PLT 310 267 223   BMET Recent Labs    01/27/24 1840 01/28/24 0529 01/29/24 0615  NA 132* 133* 131*  K 2.9* 2.8* 3.9  CL 97* 96* 99  CO2 27 23 22   GLUCOSE 97 89 111*  BUN <5* <5* 7  CREATININE 1.01 0.86 1.00  CALCIUM  7.7* 7.7* 7.7*   LFT Recent Labs    01/29/24 0615  PROT 6.0*  ALBUMIN  <1.5*  AST 64*  ALT 32  ALKPHOS 135*  BILITOT 1.4*   PT/INR No results for input(s): LABPROT, INR in the last 72 hours. Hepatitis Panel No results for input(s): HEPBSAG, HCVAB, HEPAIGM, HEPBIGM in the last 72 hours.  IMPRESSION:  #20 57 year old white male with history of heavy chronic EtOH abuse, with previous admission for EtOH hepatitis.  Patient had been undergoing workup for underlying cirrhosis but did not have a definite diagnosis to date. Recent admission June 2025 for altered mental status/confusion and diagnosed with metabolic encephalopathy, there was  concern for hepatic encephalopathy and he was started on lactulose  30 cc twice daily EEG showed generalized slowing and MRI showed microvascular disease He also went through alcohol withdrawal during that admission, though denied drinking  Readmitted again 48 hours ago found confused in a neighbors yard. The story that he tells me today is different than the story that was related at the time of admission per the notes  Etiology of his altered mental status is not entirely clear at this time, not certain that this is all hepatic encephalopathy, as he has not had diagnosis to date of decompensated cirrhosis, and does not have any significant alcoholic hepatitis at present Etiology might be multifactoral and believe he also needs psychiatric consultation.  #2 new ascites-status post 7 L paracentesis yesterday, no evidence for SBP cytology pending SAAG difficult to calculate as serum albumin  and ascitic fluid albumin  are both less than 1.5-this calculates at a SAAG of 0 suggesting that portal hypertension not necessarily the cause of the ascites  #3 hypokalemia corrected #4 history of substance abuse-EtOH/THC/question other with drug screen positive for benzodiazepines on admission #5 hypokalemia corrected #6 right hepatic lobe lesion noted on recent CT-RI today shows this to be most likely a benign hepatic hemangioma  PLAN: Check INR-then calculate potential MEL D Continue lactulose  30 cc twice daily  2 g sodium diet Will ask radiologist to  amend the MRI report and comment whether or not cirrhosis present. Needs further neuropsych evaluation Will discuss further with team and GI will follow with you   Deniyah Dillavou  01/29/2024, 11:52 AM

## 2024-01-29 NOTE — Progress Notes (Signed)
   01/29/24 1924  Assess: MEWS Score  Temp 98 F (36.7 C)  BP 112/68  Pulse Rate (!) 114  Resp 16  Level of Consciousness Alert  SpO2 100 %  O2 Device Room Air  Assess: MEWS Score  MEWS Temp 0  MEWS Systolic 0  MEWS Pulse 2  MEWS RR 0  MEWS LOC 0  MEWS Score 2  MEWS Score Color Yellow  Assess: if the MEWS score is Yellow or Red  Were vital signs accurate and taken at a resting state? Yes  MEWS guidelines implemented  Yes, yellow  Treat  MEWS Interventions Considered administering scheduled or prn medications/treatments as ordered  Take Vital Signs  Increase Vital Sign Frequency  Yellow: Q2hr x1, continue Q4hrs until patient remains green for 12hrs  Escalate  MEWS: Escalate Yellow: Discuss with charge nurse and consider notifying provider and/or RRT  Notify: Charge Nurse/RN  Name of Charge Nurse/RN Notified Mahima RN  Assess: SIRS CRITERIA  SIRS Temperature  0  SIRS Respirations  0  SIRS Pulse 1  SIRS WBC 0  SIRS Score Sum  1

## 2024-01-29 NOTE — Plan of Care (Signed)

## 2024-01-30 ENCOUNTER — Ambulatory Visit: Admitting: Nurse Practitioner

## 2024-01-30 DIAGNOSIS — K7682 Hepatic encephalopathy: Secondary | ICD-10-CM | POA: Diagnosis not present

## 2024-01-30 DIAGNOSIS — E876 Hypokalemia: Secondary | ICD-10-CM | POA: Diagnosis not present

## 2024-01-30 LAB — MAGNESIUM: Magnesium: 1.6 mg/dL — ABNORMAL LOW (ref 1.7–2.4)

## 2024-01-30 LAB — CBC
HCT: 31.7 % — ABNORMAL LOW (ref 39.0–52.0)
Hemoglobin: 11.2 g/dL — ABNORMAL LOW (ref 13.0–17.0)
MCH: 31.5 pg (ref 26.0–34.0)
MCHC: 35.3 g/dL (ref 30.0–36.0)
MCV: 89.3 fL (ref 80.0–100.0)
Platelets: 246 K/uL (ref 150–400)
RBC: 3.55 MIL/uL — ABNORMAL LOW (ref 4.22–5.81)
RDW: 15.7 % — ABNORMAL HIGH (ref 11.5–15.5)
WBC: 10 K/uL (ref 4.0–10.5)
nRBC: 0 % (ref 0.0–0.2)

## 2024-01-30 LAB — COMPREHENSIVE METABOLIC PANEL WITH GFR
ALT: 31 U/L (ref 0–44)
AST: 58 U/L — ABNORMAL HIGH (ref 15–41)
Albumin: <1.5 g/dL — ABNORMAL LOW (ref 3.5–5.0)
Alkaline Phosphatase: 133 U/L — ABNORMAL HIGH (ref 38–126)
Anion gap: 9 (ref 5–15)
BUN: 7 mg/dL (ref 6–20)
CO2: 24 mmol/L (ref 22–32)
Calcium: 7.9 mg/dL — ABNORMAL LOW (ref 8.9–10.3)
Chloride: 99 mmol/L (ref 98–111)
Creatinine, Ser: 1.1 mg/dL (ref 0.61–1.24)
GFR, Estimated: >60 mL/min (ref >60)
Glucose, Bld: 103 mg/dL — ABNORMAL HIGH (ref 70–99)
Potassium: 4.3 mmol/L (ref 3.5–5.1)
Sodium: 132 mmol/L — ABNORMAL LOW (ref 135–145)
Total Bilirubin: 1.3 mg/dL — ABNORMAL HIGH (ref 0.0–1.2)
Total Protein: 6 g/dL — ABNORMAL LOW (ref 6.5–8.1)

## 2024-01-30 LAB — PHOSPHORUS: Phosphorus: 2.8 mg/dL (ref 2.5–4.6)

## 2024-01-30 MED ORDER — LORAZEPAM 1 MG PO TABS
1.0000 mg | ORAL_TABLET | Freq: Four times a day (QID) | ORAL | Status: DC | PRN
  Administered 2024-02-01: 1 mg via ORAL
  Filled 2024-01-30: qty 1

## 2024-01-30 MED ORDER — LACTULOSE 10 GM/15ML PO SOLN
30.0000 g | Freq: Every day | ORAL | Status: DC
  Administered 2024-01-31 – 2024-02-05 (×6): 30 g via ORAL
  Filled 2024-01-30 (×6): qty 45

## 2024-01-30 MED ORDER — LORAZEPAM 2 MG/ML IJ SOLN
0.0000 mg | Freq: Four times a day (QID) | INTRAMUSCULAR | Status: AC
  Administered 2024-01-30: 1 mg via INTRAVENOUS
  Administered 2024-01-30 – 2024-02-01 (×5): 2 mg via INTRAVENOUS
  Filled 2024-01-30 (×6): qty 1

## 2024-01-30 NOTE — Progress Notes (Signed)
 [facility]                                                                              Jeffrey Hodges, is a 58 y.o. male, DOB - 03-Mar-1966, FMW:[id number] Admit date - 01/27/2024    Outpatient Primary MD for the patient is [name], NP  LOS - 3  days  No chief complaint on file.      Brief summary   Patient is a 58 year old male with CAD status post stent, history of syncope, history of high risk of falls, history of heavy alcohol abuse, anemia, AKI, depression anxiety, alcoholic hepatitis, hyperlipidemia, tobacco abuse, essential hypertension, history of metabolic encephalopathy suspicious for Wernicke's encephalopathy, history of DTs, chronic hyponatremia, brought to the emergency room via EMS patient was found crawling into the neighbors yard was confused similar episode happened on the 11th where he saw snake in the neighbors yard.  In ED, labs showed sodium 129, potassium 2.7, creatinine 1.1, AST 127, ALT 41, ammonia level 58, total bilirubin 2.4, CBC showed WBCs 5, AFP 12.9 on 30 October 2023  Assessment & Plan    Principal Problem: Acute metabolic encephalopathy, likely hepatic encephalopathy (HCC) - Also suspected Warnicke's encephalopathy. - Ammonia level 58 on admission, received lactulose , thiamine  500 mg x 1 - UDS positive for benzodiazepines, THC - Continue lactulose  20 g twice daily, titrate to 2-3 BMs in a day, also started on rifaximin  by GI  History of alcohol abuse with concern for Warnicke's encephalopathy -Patient drinks beer and hard liquor daily -Placed on CIWA protocol with Ativan , today more confused and shaky, CIWA 12, family (father and stepmother at bedside) -Received thiamine  500 mg x 1 and then placed on thiamine  folic acid  MVI - Increased CIWA protocol to scheduled Ativan  every 6 hours - If no significant improvement, may need Precedex drip or phenobarbital taper.  - LFTs improving, can try Librium  protocol.  Transaminitis,  hyperbilirubinemia, severe hypoalbuminemia alcoholic cirrhosis - CT abdomen pelvis on 7/7 had shown large volume ascites, cirrhotic morphology of the liver, 2 cm right posterior hepatic lobe hypodensity, hepatocellular carcinoma cannot be excluded, recommended MRI - Recent AFP 12.9 -  Resumed Lasix  40 mg daily, Aldactone  25 mg daily, lactulose  20 g twice daily - s/p paracentesis, on 7/16 7 L of ascitic fluid removed, studies negative for SBP - MRI liver showed no worrisome focal hepatic lesion, finding on the CT scan compatible with benign hemangioma.  Possible ileus or partial SBO - Patient is having BMs, hence SBO unlikely   Hypokalemia - Replace as needed   Hyponatremia -Likely due to hypervolemia, ascites   Generalized debility - PT recommended SNF - Per father and stepmother, patient's mental status has been worsening since her previous admission.  He has been confused, forgetful, low enforcement has been called multiple times by the neighbors.   Estimated body mass index is 29.03 kg/m as calculated from the following:   Height as of this encounter: 6' 1 (1.854 m).   Weight as of this encounter: 99.8 kg.  Code Status: Full code DVT Prophylaxis:  heparin  injection 5,000 Units Start: 01/27/24 1400   Level of  Care: Level of care: Telemetry Medical Family Communication: Updated patient's father and stepmother at bedside Disposition Plan:      Remains inpatient appropriate:   [name] had requested SNF at discharge   Procedures:    Consultants:   GI  Antimicrobials:   Anti-infectives (From admission, onward)    Start     Dose/Rate Route Frequency Ordered Stop   01/29/24 2200  rifaximin  (XIFAXAN ) tablet 550 mg        550 mg Oral 2 times daily 01/29/24 1700            Medications  atorvastatin   80 mg Oral Daily   busPIRone   10 mg Oral BID   cyanocobalamin   1,000 mcg Oral Daily   FLUoxetine   40 mg Oral Daily   folic acid   1 mg Oral Daily   furosemide    40 mg Oral Daily   heparin   5,000 Units Subcutaneous Q8H   lactulose   20 g Oral BID   LORazepam   0-4 mg Intravenous Q6H   multivitamin with minerals  1 tablet Oral Daily   nicotine   21 mg Transdermal Daily   pantoprazole   40 mg Oral BID   rifaximin   550 mg Oral BID   sodium chloride  flush  3 mL Intravenous Q12H   sodium chloride  flush  3-10 mL Intravenous Q12H   spironolactone   25 mg Oral Daily   tamsulosin   0.4 mg Oral Daily   thiamine   100 mg Oral Daily      Subjective:   Jeffrey Hodges was seen and examined today.  Alert and awake, high fall risk, trying to get out of the bed, anxious and shaky.  Patient's family members at the bedside.     Objective:   Vitals:   01/29/24 1925 01/30/24 0433 01/30/24 0759 01/30/24 0827  BP: 117/79 138/73 117/79 120/65  Pulse: (!) 110  (!) 112 100  Resp: 17 17 18    Temp: (!) 97.4 F (36.3 C) 97.8 F (36.6 C) 97.6 F (36.4 C)   TempSrc: Oral Oral Oral   SpO2: 100% 98% 98%   Weight:      Height:        Intake/Output Summary (Last 24 hours) at 01/30/2024 1334 Last data filed at 01/30/2024 0916 Gross per 24 hour  Intake 6 ml  Output --  Net 6 ml      Wt Readings from Last 3 Encounters:  01/27/24 99.8 kg  01/19/24 87.4 kg  01/07/24 91 kg   Physical Exam General: Alert and oriented x self, person, somewhat confused, anxious Cardiovascular: S1 S2 clear, RRR.  Respiratory: CTAB, no wheezing Gastrointestinal: Soft, nontender, nondistended, NBS Ext: 1+  pedal edema bilaterally Neuro: Strength 5/5 in upper and lower extremities, somewhat tremulous Psych: Anxious   Data Reviewed:  I have personally reviewed following labs    CBC Lab Results  Component Value Date   WBC 10.0 01/30/2024   RBC 3.55 (L) 01/30/2024   HGB 11.2 (L) 01/30/2024   HCT 31.7 (L) 01/30/2024   MCV 89.3 01/30/2024   MCH 31.5 01/30/2024   PLT 246 01/30/2024   MCHC 35.3 01/30/2024   RDW 15.7 (H) 01/30/2024   LYMPHSABS 1.5 01/27/2024   MONOABS 1.3 (H)  01/27/2024   EOSABS 0.0 01/27/2024   BASOSABS 0.0 01/27/2024     Last metabolic panel Lab Results  Component Value Date   NA 132 (L) 01/30/2024   K 4.3 01/30/2024   CL 99 01/30/2024   CO2 24 01/30/2024  BUN 7 01/30/2024   CREATININE 1.10 01/30/2024   GLUCOSE 103 (H) 01/30/2024   GFRNONAA >60 01/30/2024   CALCIUM  7.9 (L) 01/30/2024   PHOS 2.8 01/30/2024   PROT 6.0 (L) 01/30/2024   ALBUMIN  <1.5 (L) 01/30/2024   BILITOT 1.3 (H) 01/30/2024   ALKPHOS 133 (H) 01/30/2024   AST 58 (H) 01/30/2024   ALT 31 01/30/2024   ANIONGAP 9 01/30/2024    CBG (last 3)  No results for input(s): GLUCAP in the last 72 hours.    Coagulation Profile: Recent Labs  Lab 01/29/24 1430  INR 1.3*     Radiology Studies: I have personally reviewed the imaging studies  MR LIVER W WO CONTRAST Addendum Date: 01/29/2024 ADDENDUM REPORT: 01/29/2024 15:26 ADDENDUM: The original report was by Dr. Ryan Salvage. The following addendum is by Dr. Ryan Salvage: With respect to the liver, I do not see specific indicators of cirrhosis, although there was some subtle nodularity on the CT examination which could indicate cirrhosis. The uphill paraesophageal varices indicative of portal venous hypertension would also be supportive of potential cirrhosis. One non invasive option for further characterization of the potential degree of fibrosis might be hepatic sonography with elastography. Electronically Signed   By: Ryan Salvage M.D.   On: 01/29/2024 15:26   Result Date: 01/29/2024 CLINICAL DATA:  Liver lesion.  History of alcoholic hepatitis. EXAM: MRI ABDOMEN WITHOUT AND WITH CONTRAST TECHNIQUE: Multiplanar multisequence MR imaging of the abdomen was performed both before and after the administration of intravenous contrast. CONTRAST:  10mL GADAVIST  GADOBUTROL  1 MMOL/ML IV SOLN COMPARISON:  CT abdomen 01/19/2024 FINDINGS: Despite efforts by the technologist and patient, motion artifact is present on  today's exam and could not be eliminated. This reduces exam sensitivity and specificity. Lower chest: Bandlike atelectasis in both lower lobes. Uphill varices adjacent to the distal esophagus noted compatible with portal venous hypertension. Hepatobiliary: Contracted gallbladder demonstrating gallbladder wall thickening. A homogeneously T2 hyperintense lesion in the dome of the right hepatic lobe corresponding to the finding on CT scan measures 2.0 by 1.3 by 1.9 cm, and demonstrates progressive peripheral nodular centripetal enhancement most compatible with a benign hemangioma. This lesion has no early arterial phase enhancement. No other or worrisome focal hepatic lesion is identified. No biliary dilatation. Pancreas:  Unremarkable Spleen:  Unremarkable Adrenals/Urinary Tract:  Unremarkable Stomach/Bowel: Dilated loops of left abdominal small bowel up to 4.0 cm in diameter on image 16 series 3. Vascular/Lymphatic: Narrow proximal celiac trunk, narrowed proximal celiac trunk possibly related to median arcuate ligament. Questionable atheromatous narrowing of the proximal SMA although this was less striking on the recent CT scan. Other: Ascites. Mild patchy fluid infiltration of the upper omentum. Musculoskeletal: Unremarkable IMPRESSION: 1. The lesion in the dome of the right hepatic lobe corresponding to the finding on CT scan is most compatible with a benign hemangioma. No other or worrisome focal hepatic lesion is identified. 2. Ascites. 3. Dilated loops of left abdominal small bowel up to 4.0 cm in diameter, possibly ileus or partial small bowel obstruction. 4. Upstream narrowing of the celiac trunk possibly related to median arcuate ligament. Questionable atheromatous narrowing of the proximal SMA although this was less striking on the recent CT scan. 5. Bandlike atelectasis in both lower lobes. Electronically Signed: By: Ryan Salvage M.D. On: 01/29/2024 11:35   IR Paracentesis Result Date:  01/28/2024 INDICATION: 58 year old male with hepatic encephalopathy and recurrent ascites for diagnostic and therapeutic paracentesis. EXAM: ULTRASOUND GUIDED RIGHT PARACENTESIS MEDICATIONS: 1% lidocaine  10 mL  COMPLICATIONS: None immediate. PROCEDURE: Informed written consent was obtained from the patient after a discussion of the risks, benefits and alternatives to treatment. A timeout was performed prior to the initiation of the procedure. Initial ultrasound scanning demonstrates a large amount of ascites within the right lower abdominal quadrant. The right lower abdomen was prepped and draped in the usual sterile fashion. 1% lidocaine  was used for local anesthesia. Following this, a 19 gauge, 7-cm, Yueh catheter was introduced. An ultrasound image was saved for documentation purposes. The paracentesis was performed. The catheter was removed and a dressing was applied. The patient tolerated the procedure well without immediate post procedural complication. Patient received post-procedure intravenous albumin ; see nursing notes for details. FINDINGS: A total of approximately 7 L of clear, yellow fluid was removed. Samples were sent to the laboratory as requested by the clinical team. IMPRESSION: Successful ultrasound-guided paracentesis yielding 7 liters of peritoneal fluid. PLAN: Performed By Lavanda Jurist, PA-C Electronically Signed   By: Juliene Balder M.D.   On: 01/28/2024 16:39       Chrystian Cupples M.D. [facility] 01/30/2024, 1:34 PM  Available via Epic secure chat 7am-7pm After 7 pm, please refer to night coverage provider listed on amion.

## 2024-01-30 NOTE — NC FL2 (Signed)
 [place]  MEDICAID FL2 LEVEL OF CARE FORM     IDENTIFICATION  Patient Name: Jeffrey Hodges Birthdate: 06-04-1966 Sex: male Admission Date (Current Location): 01/27/2024  [facility] and [place] Number:  Producer, television/film/video and Address:  [facility] [place], [address] [place], [place] [address]      Provider Number: [id number]  Attending Physician Name and Address:  Davia Nydia POUR, MD  Relative Name and Phone Number:       Current Level of Care: Hospital Recommended Level of Care: Skilled Nursing Facility Prior Approval Number:    Date Approved/Denied:   PASRR Number: [id number]  Discharge Plan: SNF    Current Diagnoses: Patient Active Problem List   Diagnosis Date Noted   Hepatic encephalopathy (HCC) 01/27/2024   Other ascites 01/19/2024   Chest pain 01/19/2024   Altered mental status 01/19/2024   Acute hepatic encephalopathy (HCC) 12/26/2023   Lethargy 12/25/2023   Elevated LFTs 04/14/2023   Hospital discharge follow-up 04/14/2023   AKI (acute kidney injury) (HCC) 04/03/2023   Thrombocytopenia (HCC) 04/03/2023   Anemia 04/03/2023   Transaminitis 04/03/2023   Syncope and collapse 04/02/2023   History of CAD (coronary artery disease) 04/02/2023   Night sweats 10/01/2022   Nausea and vomiting 10/01/2022   Tobacco use 09/12/2022   NSTEMI (non-ST elevated myocardial infarction) (HCC) 09/10/2022   Hyperlipidemia 09/09/2022   Chest discomfort 09/09/2022   Generalized anxiety disorder with panic attacks 04/12/2022   Depression, major, single episode, moderate (HCC) 04/12/2022   Preventative health care 12/04/2021   Primary hypertension 12/04/2021   Anxiety and depression 12/04/2021   Overweight (BMI 25.0-29.9) 12/04/2021    Orientation RESPIRATION BLADDER Height & Weight     Self, Place  Normal Continent Weight: 220 lb (99.8 kg) Height:  6' 1 (185.4 cm)  BEHAVIORAL SYMPTOMS/MOOD NEUROLOGICAL BOWEL NUTRITION STATUS      Continent     AMBULATORY STATUS COMMUNICATION OF NEEDS Skin   Limited Assist Verbally Normal                       Personal Care Assistance Level of Assistance  Bathing, Feeding, Dressing Bathing Assistance: Limited assistance Feeding assistance: Limited assistance Dressing Assistance: Limited assistance     Functional Limitations Info  Sight, Hearing, Speech Sight Info: Adequate Hearing Info: Adequate Speech Info: Adequate    SPECIAL CARE FACTORS FREQUENCY  OT (By licensed OT), PT (By licensed PT)                    Contractures Contractures Info: Not present    Additional Factors Info  Code Status Code Status Info: FULL CODE             Current Medications (01/30/2024):  This is the current hospital active medication list Current Facility-Administered Medications  Medication Dose Route Frequency Provider Last Rate Last Admin   acetaminophen  (TYLENOL ) tablet 650 mg  650 mg Oral Q6H PRN Patel, Ekta V, MD   650 mg at 01/27/24 2034   Or   acetaminophen  (TYLENOL ) suppository 650 mg  650 mg Rectal Q6H PRN Patel, Ekta V, MD       atorvastatin  (LIPITOR) tablet 80 mg  80 mg Oral Daily Patel, Ekta V, MD   80 mg at 01/30/24 0913   busPIRone  (BUSPAR ) tablet 10 mg  10 mg Oral BID Patel, Ekta V, MD   10 mg at 01/30/24 0913   cyanocobalamin  (VITAMIN B12) tablet 1,000 mcg  1,000  mcg Oral Daily Patel, Ekta V, MD   1,000 mcg at 01/30/24 9086   FLUoxetine  (PROZAC ) capsule 40 mg  40 mg Oral Daily Patel, Ekta V, MD   40 mg at 01/30/24 0912   folic acid  (FOLVITE ) tablet 1 mg  1 mg Oral Daily Tobie Mario GAILS, MD   1 mg at 01/30/24 9085   furosemide  (LASIX ) tablet 40 mg  40 mg Oral Daily Rai, Ripudeep K, MD   40 mg at 01/30/24 0914   heparin  injection 5,000 Units  5,000 Units Subcutaneous Q8H Tobie Mario V, MD   5,000 Units at 01/30/24 1412   lactulose  (CHRONULAC ) 10 GM/15ML solution 20 g  20 g Oral BID Rai, Ripudeep K, MD   20 g at 01/30/24 0912   LORazepam  (ATIVAN ) injection 0-4 mg  0-4 mg  Intravenous Q6H Rai, Ripudeep K, MD   2 mg at 01/30/24 1425   Followed by   NOREEN ON 02/01/2024] LORazepam  (ATIVAN ) tablet 1 mg  1 mg Oral Q6H PRN Rai, Ripudeep K, MD       multivitamin with minerals tablet 1 tablet  1 tablet Oral Daily Patel, Ekta V, MD   1 tablet at 01/30/24 0913   nicotine  (NICODERM CQ  - dosed in mg/24 hours) patch 21 mg  21 mg Transdermal Daily Patel, Ekta V, MD   21 mg at 01/30/24 0920   pantoprazole  (PROTONIX ) EC tablet 40 mg  40 mg Oral BID Rai, Ripudeep K, MD   40 mg at 01/30/24 0913   rifaximin  (XIFAXAN ) tablet 550 mg  550 mg Oral BID Esterwood, Amy S, PA-C   550 mg at 01/30/24 0913   sodium chloride  flush (NS) 0.9 % injection 3 mL  3 mL Intravenous Q12H Tobie Mario V, MD   3 mL at 01/30/24 0915   sodium chloride  flush (NS) 0.9 % injection 3-10 mL  3-10 mL Intravenous Q12H Tobie Mario V, MD   3 mL at 01/30/24 0916   sodium chloride  flush (NS) 0.9 % injection 3-10 mL  3-10 mL Intravenous PRN Tobie Mario GAILS, MD       spironolactone  (ALDACTONE ) tablet 25 mg  25 mg Oral Daily Rai, Ripudeep K, MD   25 mg at 01/30/24 9085   tamsulosin  (FLOMAX ) capsule 0.4 mg  0.4 mg Oral Daily Patel, Ekta V, MD   0.4 mg at 01/30/24 0912   thiamine  (VITAMIN B1) tablet 100 mg  100 mg Oral Daily Patel, Ekta V, MD   100 mg at 01/30/24 9085     Discharge Medications: Please see discharge summary for a list of discharge medications.  Relevant Imaging Results:  Relevant Lab Results:   Additional Information SSN: [id number]  Gwenn Julien [place], [place]

## 2024-01-30 NOTE — TOC Initial Note (Signed)
 Transition of Care [facility]) - Initial/Assessment Note    Patient Details  Name: Jeffrey Hodges MRN: [id number] Date of Birth: Dec 12, 1965  [facility] CM/SW Contact:    Gwenn Julien [place], [place] Phone Number: 01/30/2024, 2:24 PM  Clinical Narrative:  Spoke to pt's wife re PT recommendation for SNF. Pt with recent stay at [facility]. Will begin SNF search and f/u with offers as available.    Julien Gwenn, MSW, LCSW [phone] (coverage)                   Expected Discharge Plan: Skilled Nursing Facility Barriers to Discharge: Continued Medical Work up, English as a second language teacher, SNF Pending bed offer   Patient Goals and CMS Choice     Choice offered to / list presented to : Spouse [place] ownership interest in [facility].provided to:: Spouse    Expected Discharge Plan and Services     Post Acute Care Choice: Skilled Nursing Facility Living arrangements for the past 2 months: Single Family Home                                      Prior Living Arrangements/Services Living arrangements for the past 2 months: Single Family Home Lives with:: Spouse Patient language and need for interpreter reviewed:: Yes        Need for Family Participation in Patient Care: Yes (Comment) Care giver support system in place?: Yes (comment)   Criminal Activity/Legal Involvement Pertinent to Current Situation/Hospitalization: No - Comment as needed  Activities of Daily Living      Permission Sought/Granted Permission sought to share information with : Facility Industrial/product designer granted to share information with : Yes, Verbal Permission Granted           Permission granted to share info w Contact Information: [name]/wife  Emotional Assessment       Orientation: : Oriented to Self, Fluctuating Orientation (Suspected and/or reported Sundowners) Alcohol / Substance Use: Not Applicable Psych Involvement: No  (comment)  Admission diagnosis:  Hepatic encephalopathy (HCC) [K76.82] Hypokalemia [E87.6] Hypomagnesemia [E83.42] Patient Active Problem List   Diagnosis Date Noted   Hepatic encephalopathy (HCC) 01/27/2024   Other ascites 01/19/2024   Chest pain 01/19/2024   Altered mental status 01/19/2024   Acute hepatic encephalopathy (HCC) 12/26/2023   Lethargy 12/25/2023   Elevated LFTs 04/14/2023   Hospital discharge follow-up 04/14/2023   AKI (acute kidney injury) (HCC) 04/03/2023   Thrombocytopenia (HCC) 04/03/2023   Anemia 04/03/2023   Transaminitis 04/03/2023   Syncope and collapse 04/02/2023   History of CAD (coronary artery disease) 04/02/2023   Night sweats 10/01/2022   Nausea and vomiting 10/01/2022   Tobacco use 09/12/2022   NSTEMI (non-ST elevated myocardial infarction) (HCC) 09/10/2022   Hyperlipidemia 09/09/2022   Chest discomfort 09/09/2022   Generalized anxiety disorder with panic attacks 04/12/2022   Depression, major, single episode, moderate (HCC) 04/12/2022   Preventative health care 12/04/2021   Primary hypertension 12/04/2021   Anxiety and depression 12/04/2021   Overweight (BMI 25.0-29.9) 12/04/2021   PCP:  [name], NP Pharmacy:   [facility] [phone] [address], [place] - [address] [place] [address] Phone: [phone] Fax: [phone]  [facility] [address] [place] [address] Phone: [phone] Fax: [phone]     Social Drivers of Health (SDOH) Social  History: SDOH Screenings   Food Insecurity: No Food Insecurity (12/26/2023)  Housing: Unknown (12/26/2023)  Transportation Needs: No Transportation Needs (12/26/2023)  Utilities: Not At Risk (12/26/2023)  Depression (PHQ2-9): Medium Risk (01/19/2024)  Financial Resource Strain: Medium Risk (03/24/2023)  Social Connections: Unknown (03/24/2023)  Stress: Stress Concern Present (03/24/2023)  Tobacco Use:  Medium Risk (01/27/2024)   SDOH Interventions:     Readmission Risk Interventions     No data to display

## 2024-01-30 NOTE — Plan of Care (Signed)
  Problem: Education: Goal: Knowledge of General Education information will improve Description: Including pain rating scale, medication(s)/side effects and non-pharmacologic comfort measures Outcome: Progressing   Problem: Clinical Measurements: Goal: Respiratory complications will improve Outcome: Progressing   Problem: Elimination: Goal: Will not experience complications related to bowel motility Outcome: Progressing   Problem: Pain Managment: Goal: General experience of comfort will improve and/or be controlled Outcome: Progressing

## 2024-01-30 NOTE — Progress Notes (Signed)
 Patient ID: Jeffrey Hodges, male   DOB: 1965-08-23, 58 y.o.   MRN: [id number]    Progress Note   Subjective   Day# 4 CC; altered status, confusion, hallucinations  Patient says he does not feel as well today though not able to be specific, lunch tray sitting in front of him, not eating. He says he is not having bowel movements but I am not sure that is accurate as flowsheet shows 2 bowel movements today He is not oriented to month date or year  WBC 10.0/hemoglobin 11.2/hematocrit 31.7 Potassium 4.3/BUN 7/creatinine 1.10 Albumin  will less than 1.5 T. bili 1.3/alk phos 133/AST 58/ALT 31   Objective   Vital signs in last 24 hours: Temp:  [97.4 F (36.3 C)-98 F (36.7 C)] 98 F (36.7 C) (07/18 1418) Pulse Rate:  [100-114] 111 (07/18 1418) Resp:  [16-18] 16 (07/18 1418) BP: (110-138)/(62-84) 121/84 (07/18 1418) SpO2:  [98 %-100 %] 98 % (07/18 1418) Last BM Date : 01/30/24 General:    Older white male in NAD, alert, confused, Heart:  Regular rate and rhythm; no murmurs Lungs: Respirations even and unlabored, lungs CTA bilaterally Abdomen:  Soft, nontense ascites, nontender. Normal bowel sounds. Extremities: 1+ edema bilateral lower extremities Neurologic:  Alert and oriented, unable to cooperate appropriately to assess for asterixis, disoriented to day month and year Psych:  Cooperative.  Intake/Output from previous day: No intake/output data recorded. Intake/Output this shift: Total I/O In: 6 [I.V.:6] Out: -   Lab Results: Recent Labs    01/28/24 0529 01/29/24 0615 01/30/24 0500  WBC 7.7 11.0* 10.0  HGB 10.8* 12.1* 11.2*  HCT 30.2* 33.7* 31.7*  PLT 267 223 246   BMET Recent Labs    01/28/24 0529 01/29/24 0615 01/30/24 0500  NA 133* 131* 132*  K 2.8* 3.9 4.3  CL 96* 99 99  CO2 23 22 24   GLUCOSE 89 111* 103*  BUN <5* 7 7  CREATININE 0.86 1.00 1.10  CALCIUM  7.7* 7.7* 7.9*   LFT Recent Labs    01/30/24 0500  PROT 6.0*  ALBUMIN  <1.5*  AST 58*  ALT 31   ALKPHOS 133*  BILITOT 1.3*   PT/INR Recent Labs    01/29/24 1430  LABPROT 16.8*  INR 1.3*    Studies/Results: MR LIVER W WO CONTRAST Addendum Date: 01/29/2024 ADDENDUM REPORT: 01/29/2024 15:26 ADDENDUM: The original report was by Dr. Ryan Salvage. The following addendum is by Dr. Ryan Salvage: With respect to the liver, I do not see specific indicators of cirrhosis, although there was some subtle nodularity on the CT examination which could indicate cirrhosis. The uphill paraesophageal varices indicative of portal venous hypertension would also be supportive of potential cirrhosis. One non invasive option for further characterization of the potential degree of fibrosis might be hepatic sonography with elastography. Electronically Signed   By: Ryan Salvage M.D.   On: 01/29/2024 15:26   Result Date: 01/29/2024 CLINICAL DATA:  Liver lesion.  History of alcoholic hepatitis. EXAM: MRI ABDOMEN WITHOUT AND WITH CONTRAST TECHNIQUE: Multiplanar multisequence MR imaging of the abdomen was performed both before and after the administration of intravenous contrast. CONTRAST:  10mL GADAVIST  GADOBUTROL  1 MMOL/ML IV SOLN COMPARISON:  CT abdomen 01/19/2024 FINDINGS: Despite efforts by the technologist and patient, motion artifact is present on today's exam and could not be eliminated. This reduces exam sensitivity and specificity. Lower chest: Bandlike atelectasis in both lower lobes. Uphill varices adjacent to the distal esophagus noted compatible with portal venous hypertension. Hepatobiliary: Contracted gallbladder  demonstrating gallbladder wall thickening. A homogeneously T2 hyperintense lesion in the dome of the right hepatic lobe corresponding to the finding on CT scan measures 2.0 by 1.3 by 1.9 cm, and demonstrates progressive peripheral nodular centripetal enhancement most compatible with a benign hemangioma. This lesion has no early arterial phase enhancement. No other or worrisome focal  hepatic lesion is identified. No biliary dilatation. Pancreas:  Unremarkable Spleen:  Unremarkable Adrenals/Urinary Tract:  Unremarkable Stomach/Bowel: Dilated loops of left abdominal small bowel up to 4.0 cm in diameter on image 16 series 3. Vascular/Lymphatic: Narrow proximal celiac trunk, narrowed proximal celiac trunk possibly related to median arcuate ligament. Questionable atheromatous narrowing of the proximal SMA although this was less striking on the recent CT scan. Other: Ascites. Mild patchy fluid infiltration of the upper omentum. Musculoskeletal: Unremarkable IMPRESSION: 1. The lesion in the dome of the right hepatic lobe corresponding to the finding on CT scan is most compatible with a benign hemangioma. No other or worrisome focal hepatic lesion is identified. 2. Ascites. 3. Dilated loops of left abdominal small bowel up to 4.0 cm in diameter, possibly ileus or partial small bowel obstruction. 4. Upstream narrowing of the celiac trunk possibly related to median arcuate ligament. Questionable atheromatous narrowing of the proximal SMA although this was less striking on the recent CT scan. 5. Bandlike atelectasis in both lower lobes. Electronically Signed: By: Ryan Salvage M.D. On: 01/29/2024 11:35   IR Paracentesis Result Date: 01/28/2024 INDICATION: 58 year old male with hepatic encephalopathy and recurrent ascites for diagnostic and therapeutic paracentesis. EXAM: ULTRASOUND GUIDED RIGHT PARACENTESIS MEDICATIONS: 1% lidocaine  10 mL COMPLICATIONS: None immediate. PROCEDURE: Informed written consent was obtained from the patient after a discussion of the risks, benefits and alternatives to treatment. A timeout was performed prior to the initiation of the procedure. Initial ultrasound scanning demonstrates a large amount of ascites within the right lower abdominal quadrant. The right lower abdomen was prepped and draped in the usual sterile fashion. 1% lidocaine  was used for local anesthesia.  Following this, a 19 gauge, 7-cm, Yueh catheter was introduced. An ultrasound image was saved for documentation purposes. The paracentesis was performed. The catheter was removed and a dressing was applied. The patient tolerated the procedure well without immediate post procedural complication. Patient received post-procedure intravenous albumin ; see nursing notes for details. FINDINGS: A total of approximately 7 L of clear, yellow fluid was removed. Samples were sent to the laboratory as requested by the clinical team. IMPRESSION: Successful ultrasound-guided paracentesis yielding 7 liters of peritoneal fluid. PLAN: Performed By Lavanda Jurist, PA-C Electronically Signed   By: Juliene Balder M.D.   On: 01/28/2024 16:39       Assessment / Plan:    #42 58 year old white male with recurrent admissions with altered mental status, associated with hallucinations and persistent confusion, difficult situation which may be multifactoral, hepatic encephalopathy, possible Warnicke's though he is now on good replacement therapy, and certainly possible he has other underlying psychiatric diagnosis. Ongoing recent EtOH abuse. 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  MRI did not definitely see findings of cirrhosis but he does have portal hypertension with varices evident suggestive of cirrhosis  Plan; 2 g sodium diet Continue Lasix  40 mg p.o. daily and Aldactone  25 mg daily  Will titrate lactulose  up to 30 g twice daily Started Xifaxan  550 twice daily yesterday He is on thiamine  replacement. CIWA protocol increased today     Principal Problem:   Hepatic encephalopathy (HCC)     LOS: 3 days   Melissaann Dizdarevic EsterwoodPA-C  01/30/2024, 2:26 PM

## 2024-01-30 NOTE — Progress Notes (Signed)
 Physical Therapy Treatment Patient Details Name: Jeffrey Hodges MRN: [id number] DOB: 01/26/1966 Today's Date: 01/30/2024   History of Present Illness 58 y.o. male admitted 7/15 with acute metabolic encephalopathy, likely hepatic encephalopathy. Also suspected Warnicke's encephalopathy. PMH: heart disease status post stent, heavy alcohol abuse, anemia, AKI, depression anxiety, elevated LFTs, hyperlipidemia, tobacco abuse, essential hypertension.    PT Comments  Pt resting in bed on arrival, sleeping but easily rouse and agreeable to session. Pt continues to demonstrate poor safety awareness throughout mobility, leaving RW behind, attempting to sit prior to arriving at sitting surface, lifting RW from floor, needing max cues for all. Pt requiring heavy mod A to boost to stand from EOB at lowest height and grossly min A - CGA for gait with RW for support due to instability and poor balance/postural reactions. Patient will benefit from continued inpatient follow up therapy, <3 hours/day to address deficits and maximize functional independence and decrease caregiver burden. Will continue to follow acutely.    If plan is discharge home, recommend the following: Assistance with cooking/housework;Direct supervision/assist for medications management;Direct supervision/assist for financial management;Help with stairs or ramp for entrance;Assist for transportation;Supervision due to cognitive status;A little help with walking and/or transfers;A little help with bathing/dressing/bathroom   Can travel by private vehicle     Yes  Equipment Recommendations  Other (comment)    Recommendations for Other Services       Precautions / Restrictions Precautions Precautions: Fall Recall of Precautions/Restrictions: Impaired Precaution/Restrictions Comments: Bowel incontinence Restrictions Weight Bearing Restrictions Per Provider Order: No     Mobility  Bed Mobility Overal bed mobility: Needs Assistance Bed  Mobility: Supine to Sit, Sit to Supine     Supine to sit: Supervision Sit to supine: Supervision   General bed mobility comments: supervision for safety with increased time    Transfers Overall transfer level: Needs assistance Equipment used: Rolling walker (2 wheels) Transfers: Sit to/from Stand, Bed to chair/wheelchair/BSC Sit to Stand: Mod assist, Min assist           General transfer comment: heavy mod A to stand from EOB at lowest height, min A to stand from commode with pt utilizing grab bar    Ambulation/Gait Ambulation/Gait assistance: Contact guard assist, Min assist Gait Distance (Feet): 45 Feet Assistive device: Rolling walker (2 wheels) Gait Pattern/deviations: Step-through pattern, Decreased stride length, Drifts right/left, Wide base of support Gait velocity: decr     General Gait Details: Minor instability with RW for support. fair pace with cues for safety and awareness. A little erratic with RW but without overt LOB during bout. distance limited as skin tear on L forearm bleeding into floor, RN in and applying dressing once back to bed   Stairs             Wheelchair Mobility     Tilt Bed    Modified Rankin (Stroke Patients Only)       Balance Overall balance assessment: Needs assistance Sitting-balance support: No upper extremity supported, Feet supported Sitting balance-Leahy Scale: Good Sitting balance - Comments: EOB ADLs   Standing balance support: Bilateral upper extremity supported, During functional activity, Reliant on assistive device for balance Standing balance-Leahy Scale: Poor Standing balance comment: reliant on RW for support today                            Communication Communication Communication: No apparent difficulties  Cognition Arousal: Alert Behavior During Therapy: Flat affect  Following commands: Impaired Following commands impaired: Only follows one step  commands consistently, Follows one step commands with increased time    Cueing Cueing Techniques: Verbal cues, Gestural cues  Exercises      General Comments General comments (skin integrity, edema, etc.): lunch tray set up at end of session      Pertinent Vitals/Pain Pain Assessment Pain Assessment: No/denies pain    Home Living                          Prior Function            PT Goals (current goals can now be found in the care plan section) Acute Rehab PT Goals PT Goal Formulation: Patient unable to participate in goal setting Time For Goal Achievement: 02/12/24 Progress towards PT goals: Progressing toward goals    Frequency    Min 2X/week      PT Plan      Co-evaluation              AM-PAC PT 6 Clicks Mobility   Outcome Measure  Help needed turning from your back to your side while in a flat bed without using bedrails?: None Help needed moving from lying on your back to sitting on the side of a flat bed without using bedrails?: A Little Help needed moving to and from a bed to a chair (including a wheelchair)?: A Little Help needed standing up from a chair using your arms (e.g., wheelchair or bedside chair)?: A Little Help needed to walk in hospital room?: A Little Help needed climbing 3-5 steps with a railing? : A Lot 6 Click Score: 18    End of Session Equipment Utilized During Treatment: Gait belt Activity Tolerance: Patient tolerated treatment well Patient left: in bed;with call bell/phone within reach;with bed alarm set;Other (comment) (with fall mats replaced) Nurse Communication: Mobility status PT Visit Diagnosis: Unsteadiness on feet (R26.81);Other abnormalities of gait and mobility (R26.89);Other symptoms and signs involving the nervous system (R29.898);History of falling (Z91.81);Muscle weakness (generalized) (M62.81);Repeated falls (R29.6)     Time: 8684-8668 PT Time Calculation (min) (ACUTE ONLY): 16 min  Charges:     $Gait Training: 8-22 mins PT General Charges $$ ACUTE PT VISIT: 1 Visit                     Chirsty Armistead R. PTA Acute Rehabilitation Services Office: [phone]   Therisa CHRISTELLA Boor 01/30/2024, 2:00 PM

## 2024-01-30 NOTE — Progress Notes (Signed)
 This RN was informed by the primary RN that this patient was confused and in need of a sitter.  Ripudeep MD made aware and 1:1 sitter was ordered.  This RN called staffing and there were no sitters available.  Tele sitter was ordered.  This RN called tele sitter line and no cameras were available.  This patient is on the waitlist for both.

## 2024-01-31 DIAGNOSIS — E876 Hypokalemia: Secondary | ICD-10-CM | POA: Diagnosis not present

## 2024-01-31 DIAGNOSIS — K7682 Hepatic encephalopathy: Secondary | ICD-10-CM | POA: Diagnosis not present

## 2024-01-31 LAB — COMPREHENSIVE METABOLIC PANEL WITH GFR
ALT: 30 U/L (ref 0–44)
AST: 56 U/L — ABNORMAL HIGH (ref 15–41)
Albumin: <1.5 g/dL — ABNORMAL LOW (ref 3.5–5.0)
Alkaline Phosphatase: 135 U/L — ABNORMAL HIGH (ref 38–126)
Anion gap: 7 (ref 5–15)
BUN: 8 mg/dL (ref 6–20)
CO2: 24 mmol/L (ref 22–32)
Calcium: 7.7 mg/dL — ABNORMAL LOW (ref 8.9–10.3)
Chloride: 100 mmol/L (ref 98–111)
Creatinine, Ser: 1.03 mg/dL (ref 0.61–1.24)
GFR, Estimated: >60 mL/min (ref >60)
Glucose, Bld: 97 mg/dL (ref 70–99)
Potassium: 3.7 mmol/L (ref 3.5–5.1)
Sodium: 131 mmol/L — ABNORMAL LOW (ref 135–145)
Total Bilirubin: 1.3 mg/dL — ABNORMAL HIGH (ref 0.0–1.2)
Total Protein: 5.8 g/dL — ABNORMAL LOW (ref 6.5–8.1)

## 2024-01-31 LAB — CBC
HCT: 30.1 % — ABNORMAL LOW (ref 39.0–52.0)
Hemoglobin: 10.9 g/dL — ABNORMAL LOW (ref 13.0–17.0)
MCH: 32.2 pg (ref 26.0–34.0)
MCHC: 36.2 g/dL — ABNORMAL HIGH (ref 30.0–36.0)
MCV: 88.8 fL (ref 80.0–100.0)
Platelets: 225 K/uL (ref 150–400)
RBC: 3.39 MIL/uL — ABNORMAL LOW (ref 4.22–5.81)
RDW: 15.8 % — ABNORMAL HIGH (ref 11.5–15.5)
WBC: 9.2 K/uL (ref 4.0–10.5)
nRBC: 0.2 % (ref 0.0–0.2)

## 2024-01-31 LAB — MAGNESIUM: Magnesium: 1.6 mg/dL — ABNORMAL LOW (ref 1.7–2.4)

## 2024-01-31 LAB — PHOSPHORUS: Phosphorus: 3.6 mg/dL (ref 2.5–4.6)

## 2024-01-31 MED ORDER — THIAMINE HCL 100 MG/ML IJ SOLN
500.0000 mg | Freq: Three times a day (TID) | INTRAVENOUS | Status: AC
  Administered 2024-01-31 – 2024-02-03 (×9): 500 mg via INTRAVENOUS
  Filled 2024-01-31 (×3): qty 5
  Filled 2024-01-31: qty 466.67
  Filled 2024-01-31 (×6): qty 5

## 2024-01-31 NOTE — Consult Note (Incomplete)
 NEUROLOGY CONSULT NOTE   Date of service: January 31, 2024 Patient Name: Jeffrey Hodges MRN:  [id number] DOB:  25-Dec-1965 Chief Complaint: encephalopathy Requesting Provider: Davia Nydia POUR, MD  History of Present Illness  Jeffrey Hodges is a 58 y.o. male with hx of anxiety, CAD, depression, hyperlipidemia, hypertension, heavy alcohol use complicated by a history of Wernicke's encephalopathy who was brought to the ED with confusion.  ***  LKW: *** Modified rankin score: {Modified Rankin Scale:21264} IV Thrombolysis: ***Yes, *** No (reason) EVT: ***Yes, *** No (reason) ICH Score:***  NIHSS components Score: Comment  1a Level of Conscious 0[]  1[]  2[]  3[]      1b LOC Questions 0[]  1[]  2[]       1c LOC Commands 0[]  1[]  2[]       2 Best Gaze 0[]  1[]  2[]       3 Visual 0[]  1[]  2[]  3[]      4 Facial Palsy 0[]  1[]  2[]  3[]      5a Motor Arm - left 0[]  1[]  2[]  3[]  4[]  UN[]    5b Motor Arm - Right 0[]  1[]  2[]  3[]  4[]  UN[]    6a Motor Leg - Left 0[]  1[]  2[]  3[]  4[]  UN[]    6b Motor Leg - Right 0[]  1[]  2[]  3[]  4[]  UN[]    7 Limb Ataxia 0[]  1[]  2[]  UN[]      8 Sensory 0[]  1[]  2[]  UN[]      9 Best Language 0[]  1[]  2[]  3[]      10 Dysarthria 0[]  1[]  2[]  UN[]      11 Extinct. 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 His smoking use included cigarettes. He has never been exposed to tobacco smoke. He has never used smokeless tobacco. He reports current alcohol use. He reports current drug use. Drug: Marijuana.  No Known Allergies  Medications   Current Facility-Administered Medications:    acetaminophen  (TYLENOL ) tablet 650 mg, 650 mg, Oral, Q6H PRN, 650 mg at 01/27/24 2034 **OR** acetaminophen  (TYLENOL ) suppository 650 mg, 650 mg, Rectal, Q6H PRN, Tobie Mario GAILS, MD   atorvastatin  (LIPITOR) tablet 80 mg, 80 mg, Oral, Daily, Patel, Ekta V, MD, 80 mg at 01/31/24 0802   busPIRone  (BUSPAR ) tablet 10 mg, 10 mg, Oral, BID, Patel, Ekta V, MD, 10 mg at 01/31/24 0802   cyanocobalamin  (VITAMIN B12) tablet 1,000 mcg, 1,000 mcg, Oral,  Daily, Tobie Modest V, MD, 1,000 mcg at 01/31/24 0802   FLUoxetine  (PROZAC ) capsule 40 mg, 40 mg, Oral, Daily, Patel, Ekta V, MD, 40 mg at 01/31/24 0802   folic acid  (FOLVITE ) tablet 1 mg, 1 mg, Oral, Daily, Tobie Modest V, MD, 1 mg at 01/31/24 0802   furosemide  (LASIX ) tablet 40 mg, 40 mg, Oral, Daily, Rai, Ripudeep K, MD, 40 mg at 01/31/24 0803   heparin  injection 5,000 Units, 5,000 Units, Subcutaneous, Q8H, Tobie Modest V, MD, 5,000 Units at 01/31/24 1411   lactulose  (CHRONULAC ) 10 GM/15ML solution 30 g, 30 g, Oral, Daily, Esterwood, Amy S, PA-C, 30 g at 01/31/24 0804   LORazepam  (ATIVAN ) injection 0-4 mg, 0-4 mg, Intravenous, Q6H, 2 mg at 01/31/24 2002 **FOLLOWED BY** [START ON 02/01/2024] LORazepam  (ATIVAN ) tablet 1 mg, 1 mg, Oral, Q6H PRN, Rai, Ripudeep K, MD   multivitamin with minerals tablet 1 tablet, 1 tablet, Oral, Daily, Tobie Modest V, MD, 1 tablet at 01/31/24 0802    nicotine  (NICODERM CQ  - dosed in mg/24 hours) patch 21 mg, 21 mg, Transdermal, Daily, Tobie Modest V, MD, 21 mg at 01/31/24 0804   pantoprazole  (PROTONIX ) EC tablet 40 mg, 40 mg, Oral, BID, Rai, Ripudeep K, MD, 40 mg at 01/31/24 0803   rifaximin  (XIFAXAN ) tablet 550 mg, 550 mg, Oral, BID, Esterwood, Amy S, PA-C, 550 mg at 01/31/24 0802   sodium chloride  flush (NS) 0.9 % injection 3 mL, 3 mL, Intravenous, Q12H, Tobie Modest V, MD, 3 mL at 01/31/24 0804   sodium chloride  flush (NS) 0.9 % injection 3-10 mL, 3-10 mL, Intravenous, Q12H, Tobie Modest V, MD, 10 mL at 01/31/24 0804   sodium chloride  flush (NS) 0.9 % injection 3-10 mL, 3-10 mL, Intravenous, PRN, Tobie Modest GAILS, MD   spironolactone  (ALDACTONE ) tablet 25 mg, 25 mg, Oral, Daily, Rai, Ripudeep K, MD, 25 mg at 01/31/24 0803   tamsulosin  (FLOMAX ) capsule 0.4 mg, 0.4 mg, Oral, Daily, Tobie Modest V, MD, 0.4 mg at 01/31/24 0802   thiamine  (VITAMIN B1) 500 mg in sodium chloride  0.9 % 50 mL IVPB, 500 mg, Intravenous, Q8H, Rai, Ripudeep K, MD, Last Rate: 110 mL/hr at 01/31/24 1411, 500 mg at 01/31/24 1411  Vitals   Vitals:   01/31/24 0758 01/31/24 1418 01/31/24 1949 01/31/24 2017  BP: 116/88 107/77 134/89 118/84  Pulse:   (!) 120 (!) 116  Resp:  18 18 20   Temp: 97.7 F (36.5 C) 98.3 F (36.8 C) (!) 97.3 F (36.3 C) 98.2 F (36.8 C)  TempSrc: Oral Oral  Oral  SpO2: 95% 96% 95% 98%  Weight:      Height:        Body mass index is 29.03 kg/m.   Physical Exam   Constitutional: Appears well-developed and well-nourished. *** Psych: Affect appropriate to situation. *** Eyes: No scleral injection. *** HENT: No OP obstruction. *** Head: Normocephalic. *** Cardiovascular: Normal rate and regular rhythm. *** Respiratory: Effort normal, non-labored breathing. *** GI: Soft.  No distension. There is no tenderness. *** Skin: WDI. ***  Neurologic Examination   ***  Labs/Imaging/Neurodiagnostic studies   CBC:  Recent Labs  Lab  01/27/24 0842 01/28/24 0529 01/30/24 0500 01/31/24 0447  WBC 11.0*   < > 10.0 9.2  NEUTROABS 8.1*  --   --   --   HGB 11.3*   < > 11.2* 10.9*  HCT 32.1*   < > 31.7* 30.1*  MCV 92.8   < > 89.3 88.8  PLT 310   < >  246 225   < > = values in this interval not displayed.   Basic Metabolic Panel:  Lab Results  Component Value Date   NA 131 (L) 01/31/2024   K 3.7 01/31/2024   CO2 24 01/31/2024   GLUCOSE 97 01/31/2024   BUN 8 01/31/2024   CREATININE 1.03 01/31/2024   CALCIUM  7.7 (L) 01/31/2024   GFRNONAA >60 01/31/2024   Lipid Panel:  Lab Results  Component Value Date   LDLCALC 207 (H) 09/10/2022   HgbA1c:  Lab Results  Component Value Date   HGBA1C 5.4 09/10/2022   Urine Drug Screen:     Component Value Date/Time   LABOPIA NONE DETECTED 01/28/2024 0635   COCAINSCRNUR NONE DETECTED 01/28/2024 0635   LABBENZ POSITIVE (A) 01/28/2024 0635   AMPHETMU NONE DETECTED 01/28/2024 0635   THCU POSITIVE (A) 01/28/2024 0635   LABBARB NONE DETECTED 01/28/2024 0635    Alcohol Level     Component Value Date/Time   ETH <15 01/27/2024 0842   INR  Lab Results  Component Value Date   INR 1.3 (H) 01/29/2024   APTT No results found for: APTT AED levels: No results found for: PHENYTOIN, ZONISAMIDE, LAMOTRIGINE, LEVETIRACETA  CT Head without contrast(Personally reviewed): ***  CT angio Head and Neck with contrast(Personally reviewed): ***  MR Angio head without contrast and Carotid Duplex BL(Personally reviewed): ***  MRI Brain(Personally reviewed): ***  Neurodiagnostics rEEG:  ***  ASSESSMENT   Jeffrey Hodges is a 58 y.o. male ***  RECOMMENDATIONS  *** ______________________________________________________________________    Bonney Ellouise Mari, MD [facility]

## 2024-01-31 NOTE — Progress Notes (Signed)
   01/31/24 1143  Mobility  Activity Ambulated with assistance in room  Level of Assistance Contact guard assist, steadying assist  Assistive Device Front wheel walker  Distance Ambulated (ft) 30 ft  Activity Response Tolerated well  Mobility Referral Yes  Mobility visit 1 Mobility  Mobility Specialist Start Time (ACUTE ONLY) 1143  Mobility Specialist Stop Time (ACUTE ONLY) 1154  Mobility Specialist Time Calculation (min) (ACUTE ONLY) 11 min   Mobility Specialist: Progress Note  Pt agreeable to mobility session - received in bed. Pt was asymptomatic throughout session with no complaints.  Returned to chair with all needs met - call bell within reach. Chair alarm on.   Virgle Boards, BS Mobility Specialist Please contact via SecureChat or  Rehab office at [phone].

## 2024-01-31 NOTE — TOC Progression Note (Signed)
 Transition of Care [facility]) - Progression Note    Patient Details  Name: Jeffrey Hodges MRN: [id number] Date of Birth: [birth year]  Transition of Care [facility]) CM/SW Contact  Almarie CHRISTELLA Goodie, [place] Phone Number: 01/31/2024, 1:42 PM  Clinical Narrative:   CSW spoke with patient's spouse, [name], to provide bed offers. Spouse chose [facility]. CSW to follow to request SNF to start insurance auth when closer to medically ready.     Expected Discharge Plan: Skilled Nursing Facility Barriers to Discharge: Continued Medical Work up, English as a second language teacher  Expected Discharge Plan and Services     Post Acute Care Choice: Skilled Nursing Facility Living arrangements for the past 2 months: Single Family Home                                       Social Determinants of Health (SDOH) Interventions SDOH Screenings   Food Insecurity: No Food Insecurity (12/26/2023)  Housing: Unknown (12/26/2023)  Transportation Needs: No Transportation Needs (12/26/2023)  Utilities: Not At Risk (12/26/2023)  Depression (PHQ2-9): Medium Risk (01/19/2024)  Financial Resource Strain: Medium Risk (03/24/2023)  Social Connections: Unknown (03/24/2023)  Stress: Stress Concern Present (03/24/2023)  Tobacco Use: Medium Risk (01/27/2024)    Readmission Risk Interventions     No data to display

## 2024-01-31 NOTE — Progress Notes (Signed)
 [facility]                                                                              Jeffrey Hodges, is a 58 y.o. male, DOB - [birth year], FMW:[id number] Admit date - 01/27/2024    Outpatient Primary MD for the patient is Jeffrey Lynwood HERO, NP  LOS - 4  days  No chief complaint on file.      Brief summary   Patient is a 58 year old male with CAD status post stent, history of syncope, history of high risk of falls, history of heavy alcohol abuse, anemia, AKI, depression anxiety, alcoholic hepatitis, hyperlipidemia, tobacco abuse, essential hypertension, history of metabolic encephalopathy suspicious for Wernicke's encephalopathy, history of DTs, chronic hyponatremia, brought to the emergency room via EMS patient was found crawling into the neighbors yard was confused similar episode happened on the 11th where he saw snake in the neighbors yard.  In ED, labs showed sodium 129, potassium 2.7, creatinine 1.1, AST 127, ALT 41, ammonia level 58, total bilirubin 2.4, CBC showed WBCs 5, AFP 12.9 on 30 October 2023  Assessment & Plan    Principal Problem: Acute metabolic encephalopathy, likely hepatic encephalopathy (HCC) - Also suspected Warnicke's encephalopathy. - Ammonia level 58 on admission, received lactulose , thiamine  500 mg x 1 - UDS positive for benzodiazepines, THC - Continue lactulose  20 g twice daily, titrate to 2-3 BMs in a day -  started on rifaximin  by GI  History of alcohol abuse with concern for Warnicke's encephalopathy -Patient drinks beer and hard liquor daily -Placed on CIWA protocol with Ativan , today more confused and shaky, CIWA 12, family (father and stepmother at bedside) -Received thiamine  500 mg x 1 and then was placed on thiamine  folic acid  MVI - Continue CIWA protocol, somewhat calmer today but still confused.  CIWA 7 at 3:39 AM - Discussed with neurology, Dr Matthews will evaluate, recommended high-dose IV thiamine     Transaminitis,  hyperbilirubinemia, severe hypoalbuminemia alcoholic cirrhosis - CT abdomen pelvis on 7/7 had shown large volume ascites, cirrhotic morphology of the liver, 2 cm right posterior hepatic lobe hypodensity, hepatocellular carcinoma cannot be excluded, recommended MRI - Recent AFP 12.9 -  Resumed Lasix  40 mg daily, Aldactone  25 mg daily, lactulose  20 g twice daily, started on rifaximin  - s/p paracentesis, on 7/16 7 L of ascitic fluid removed, studies negative for SBP - MRI liver showed no worrisome focal hepatic lesion, finding on the CT scan compatible with benign hemangioma.  Possible ileus or partial SBO - Patient is having BMs, hence SBO unlikely   Hypokalemia - Replace as needed   Hyponatremia -Likely due to hypervolemia, ascites   Generalized debility - PT recommended SNF - Per father and stepmother, patient's mental status has been worsening since her previous admission.  He has been confused, forgetful, wandering, low enforcement has been called multiple times by the neighbors.   Estimated body mass index is 29.03 kg/m as calculated from the following:   Height as of this encounter: 6' 1 (1.854 m).   Weight as of this encounter: 99.8 kg.  Code Status: Full code DVT Prophylaxis:  heparin  injection 5,000 Units Start:  01/27/24 1400   Level of Care: Level of care: Telemetry Medical Family Communication: Updated patient's stepmother on the phone Disposition Plan:      Remains inpatient appropriate:   [name] had requested SNF at discharge   Procedures:    Consultants:   GI Neurology  Antimicrobials:   Anti-infectives (From admission, onward)    Start     Dose/Rate Route Frequency Ordered Stop   01/29/24 2200  rifaximin  (XIFAXAN ) tablet 550 mg        550 mg Oral 2 times daily 01/29/24 1700            Medications  atorvastatin   80 mg Oral Daily   busPIRone   10 mg Oral BID   cyanocobalamin   1,000 mcg Oral Daily   FLUoxetine   40 mg Oral Daily   folic  acid  1 mg Oral Daily   furosemide   40 mg Oral Daily   heparin   5,000 Units Subcutaneous Q8H   lactulose   30 g Oral Daily   LORazepam   0-4 mg Intravenous Q6H   multivitamin with minerals  1 tablet Oral Daily   nicotine   21 mg Transdermal Daily   pantoprazole   40 mg Oral BID   rifaximin   550 mg Oral BID   sodium chloride  flush  3 mL Intravenous Q12H   sodium chloride  flush  3-10 mL Intravenous Q12H   spironolactone   25 mg Oral Daily   tamsulosin   0.4 mg Oral Daily      Subjective:   Jeffrey Hodges was seen and examined today.  Calmer than yesterday however still very confused.  No significant change in the mental status.  Objective:   Vitals:   01/30/24 2033 01/31/24 0351 01/31/24 0500 01/31/24 0758  BP: 121/84 (!) 113/58 117/79 116/88  Pulse: (!) 110 (!) 102 (!) 105   Resp: 19     Temp: 98 F (36.7 C) 98.2 F (36.8 C)  97.7 F (36.5 C)  TempSrc: Oral Axillary  Oral  SpO2: 95% 93%  95%  Weight:      Height:       No intake or output data in the 24 hours ending 01/31/24 1154     Wt Readings from Last 3 Encounters:  01/27/24 99.8 kg  01/19/24 87.4 kg  01/07/24 91 kg    Physical Exam General: Alert and oriented x self, confused, calmer than yesterday Cardiovascular: S1 S2 clear, RRR.  Respiratory: CTAB, no wheezing, rales or rhonchi Gastrointestinal: Soft, nontender, ascites, NBS Ext: 1+ pedal edema bilaterally, tremulous Neuro: no new deficits moving all 4 extremities Psych: Confused    Data Reviewed:  I have personally reviewed following labs    CBC Lab Results  Component Value Date   WBC 9.2 01/31/2024   RBC 3.39 (L) 01/31/2024   HGB 10.9 (L) 01/31/2024   HCT 30.1 (L) 01/31/2024   MCV 88.8 01/31/2024   MCH 32.2 01/31/2024   PLT 225 01/31/2024   MCHC 36.2 (H) 01/31/2024   RDW 15.8 (H) 01/31/2024   LYMPHSABS 1.5 01/27/2024   MONOABS 1.3 (H) 01/27/2024   EOSABS 0.0 01/27/2024   BASOSABS 0.0 01/27/2024     Last metabolic panel Lab Results   Component Value Date   NA 131 (L) 01/31/2024   K 3.7 01/31/2024   CL 100 01/31/2024   CO2 24 01/31/2024   BUN 8 01/31/2024   CREATININE 1.03 01/31/2024   GLUCOSE 97 01/31/2024   GFRNONAA >60 01/31/2024   CALCIUM  7.7 (L) 01/31/2024   PHOS  3.6 01/31/2024   PROT 5.8 (L) 01/31/2024   ALBUMIN  <1.5 (L) 01/31/2024   BILITOT 1.3 (H) 01/31/2024   ALKPHOS 135 (H) 01/31/2024   AST 56 (H) 01/31/2024   ALT 30 01/31/2024   ANIONGAP 7 01/31/2024    CBG (last 3)  No results for input(s): GLUCAP in the last 72 hours.    Coagulation Profile: Recent Labs  Lab 01/29/24 1430  INR 1.3*     Radiology Studies: I have personally reviewed the imaging studies  No results found.      Nydia Distance M.D. [facility] 01/31/2024, 11:54 AM  Available via Epic secure chat 7am-7pm After 7 pm, please refer to night coverage provider listed on amion.

## 2024-01-31 NOTE — Plan of Care (Signed)

## 2024-02-01 DIAGNOSIS — F10239 Alcohol dependence with withdrawal, unspecified: Secondary | ICD-10-CM

## 2024-02-01 DIAGNOSIS — Z7189 Other specified counseling: Secondary | ICD-10-CM

## 2024-02-01 DIAGNOSIS — E512 Wernicke's encephalopathy: Secondary | ICD-10-CM | POA: Diagnosis not present

## 2024-02-01 DIAGNOSIS — E7220 Disorder of urea cycle metabolism, unspecified: Secondary | ICD-10-CM | POA: Diagnosis not present

## 2024-02-01 DIAGNOSIS — K7682 Hepatic encephalopathy: Secondary | ICD-10-CM | POA: Diagnosis not present

## 2024-02-01 DIAGNOSIS — E876 Hypokalemia: Secondary | ICD-10-CM | POA: Diagnosis not present

## 2024-02-01 DIAGNOSIS — Z515 Encounter for palliative care: Secondary | ICD-10-CM

## 2024-02-01 LAB — COMPREHENSIVE METABOLIC PANEL WITH GFR
ALT: 35 U/L (ref 0–44)
AST: 65 U/L — ABNORMAL HIGH (ref 15–41)
Albumin: <1.5 g/dL — ABNORMAL LOW (ref 3.5–5.0)
Alkaline Phosphatase: 148 U/L — ABNORMAL HIGH (ref 38–126)
Anion gap: 10 (ref 5–15)
BUN: 11 mg/dL (ref 6–20)
CO2: 22 mmol/L (ref 22–32)
Calcium: 7.9 mg/dL — ABNORMAL LOW (ref 8.9–10.3)
Chloride: 99 mmol/L (ref 98–111)
Creatinine, Ser: 1.09 mg/dL (ref 0.61–1.24)
GFR, Estimated: >60 mL/min (ref >60)
Glucose, Bld: 87 mg/dL (ref 70–99)
Potassium: 3.6 mmol/L (ref 3.5–5.1)
Sodium: 131 mmol/L — ABNORMAL LOW (ref 135–145)
Total Bilirubin: 1 mg/dL (ref 0.0–1.2)
Total Protein: 6.2 g/dL — ABNORMAL LOW (ref 6.5–8.1)

## 2024-02-01 LAB — CBC
HCT: 31.1 % — ABNORMAL LOW (ref 39.0–52.0)
Hemoglobin: 11.2 g/dL — ABNORMAL LOW (ref 13.0–17.0)
MCH: 32.1 pg (ref 26.0–34.0)
MCHC: 36 g/dL (ref 30.0–36.0)
MCV: 89.1 fL (ref 80.0–100.0)
Platelets: 216 K/uL (ref 150–400)
RBC: 3.49 MIL/uL — ABNORMAL LOW (ref 4.22–5.81)
RDW: 15.9 % — ABNORMAL HIGH (ref 11.5–15.5)
WBC: 8.2 K/uL (ref 4.0–10.5)
nRBC: 0 % (ref 0.0–0.2)

## 2024-02-01 LAB — AMMONIA: Ammonia: 49 umol/L — ABNORMAL HIGH (ref 9–35)

## 2024-02-01 MED ORDER — CHLORDIAZEPOXIDE HCL 25 MG PO CAPS
25.0000 mg | ORAL_CAPSULE | Freq: Three times a day (TID) | ORAL | Status: AC
  Administered 2024-02-02 – 2024-02-03 (×3): 25 mg via ORAL
  Filled 2024-02-01 (×3): qty 1

## 2024-02-01 MED ORDER — CHLORDIAZEPOXIDE HCL 25 MG PO CAPS
25.0000 mg | ORAL_CAPSULE | Freq: Four times a day (QID) | ORAL | Status: AC
  Administered 2024-02-01 – 2024-02-02 (×4): 25 mg via ORAL
  Filled 2024-02-01 (×4): qty 1

## 2024-02-01 MED ORDER — HYDROXYZINE HCL 25 MG PO TABS: 25.0000 mg | ORAL_TABLET | Freq: Four times a day (QID) | ORAL | Status: AC | PRN

## 2024-02-01 MED ORDER — CHLORDIAZEPOXIDE HCL 25 MG PO CAPS
25.0000 mg | ORAL_CAPSULE | Freq: Every day | ORAL | Status: AC
  Administered 2024-02-04: 25 mg via ORAL
  Filled 2024-02-01: qty 1

## 2024-02-01 MED ORDER — ONDANSETRON 4 MG PO TBDP: 4.0000 mg | ORAL_TABLET | Freq: Four times a day (QID) | ORAL | Status: AC | PRN

## 2024-02-01 MED ORDER — LOPERAMIDE HCL 2 MG PO CAPS: 2.0000 mg | ORAL_CAPSULE | ORAL | Status: AC | PRN

## 2024-02-01 MED ORDER — CHLORDIAZEPOXIDE HCL 25 MG PO CAPS
25.0000 mg | ORAL_CAPSULE | Freq: Four times a day (QID) | ORAL | Status: AC | PRN
  Filled 2024-02-01: qty 1

## 2024-02-01 MED ORDER — CHLORDIAZEPOXIDE HCL 25 MG PO CAPS
25.0000 mg | ORAL_CAPSULE | ORAL | Status: AC
  Administered 2024-02-03 – 2024-02-04 (×2): 25 mg via ORAL
  Filled 2024-02-01 (×2): qty 1

## 2024-02-01 NOTE — Consult Note (Signed)
 NEUROLOGY CONSULT NOTE   Date of service: February 01, 2024 Patient Name: Jeffrey Hodges MRN:  [id number] DOB:  [birth year] Chief Complaint: Altered mental status Requesting Provider: Davia Nydia POUR, MD  History of Present Illness  Jeffrey Hodges is a 58 y.o. male with hx of CAD, syncope, heavy alcohol abuse, anemia, depression, anxiety, alcoholic hepatitis, hyperlipidemia, tobacco use, hypertension, DTs and chronic hyponatremia who presented with acute onset altered mental status was found in the neighbor's yard confused.  He does have a history of heavy alcohol consumption with current consumption and has been placed on CIWA protocol.  He is receiving high-dose Wernicke's protocol thiamine .  Ammonia is noted to be elevated at 49, and he is appropriately receiving lactulose  and rifaximin .  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 His smoking use included cigarettes. He has never been exposed to tobacco smoke. He has never used smokeless tobacco. He reports current alcohol use. He reports current  drug use. Drug: Marijuana.  No Known Allergies  Medications   Current Facility-Administered Medications:    acetaminophen  (TYLENOL ) tablet 650 mg, 650 mg, Oral, Q6H PRN, 650 mg at 01/31/24 2132 **OR** acetaminophen  (TYLENOL ) suppository 650 mg, 650 mg, Rectal, Q6H PRN, Tobie Mario GAILS, MD   atorvastatin  (LIPITOR) tablet 80 mg, 80 mg, Oral, Daily, Patel, Ekta V, MD, 80 mg at 02/01/24 0900   busPIRone  (BUSPAR ) tablet 10 mg, 10 mg, Oral, BID, Patel, Ekta V, MD, 10 mg at 02/01/24 0900   chlordiazePOXIDE  (LIBRIUM ) capsule 25 mg, 25 mg, Oral, Q6H PRN, Rai, Ripudeep K, MD   chlordiazePOXIDE  (LIBRIUM ) capsule 25 mg, 25 mg, Oral, QID, 25 mg at 02/01/24 1319 **FOLLOWED BY** [START ON 02/02/2024] chlordiazePOXIDE  (LIBRIUM ) capsule 25 mg, 25 mg, Oral, TID **FOLLOWED BY** [START ON 02/03/2024] chlordiazePOXIDE  (LIBRIUM ) capsule 25 mg, 25 mg, Oral, BH-qamhs **FOLLOWED BY** [START ON 02/04/2024] chlordiazePOXIDE  (LIBRIUM ) capsule 25 mg, 25 mg, Oral, Daily, Rai, Ripudeep K, MD   cyanocobalamin  (VITAMIN B12) tablet 1,000 mcg, 1,000 mcg, Oral, Daily, Tobie, Ekta V, MD, 1,000 mcg at 02/01/24 0900   FLUoxetine  (PROZAC ) capsule 40 mg, 40 mg, Oral, Daily, Patel, Ekta V, MD, 40 mg at 02/01/24 0900   folic acid  (FOLVITE ) tablet 1 mg, 1 mg, Oral, Daily, Tobie Mario V, MD, 1 mg at 02/01/24 0901   furosemide  (LASIX ) tablet 40 mg, 40 mg, Oral, Daily, Rai, Ripudeep K, MD, 40 mg at 02/01/24 0900   heparin  injection 5,000 Units, 5,000 Units, Subcutaneous,  Q8H, Tobie Mario GAILS, MD, 5,000 Units at 02/01/24 1318   hydrOXYzine  (ATARAX ) tablet 25 mg, 25 mg, Oral, Q6H PRN, Rai, Ripudeep K, MD   lactulose  (CHRONULAC ) 10 GM/15ML solution 30 g, 30 g, Oral, Daily, Esterwood, Amy S, PA-C, 30 g at 02/01/24 0902   loperamide  (IMODIUM ) capsule 2-4 mg, 2-4 mg, Oral, PRN, Rai, Ripudeep K, MD   [EXPIRED] LORazepam  (ATIVAN ) injection 0-4 mg, 0-4 mg, Intravenous, Q6H, 2 mg at 02/01/24 0419 **FOLLOWED BY** LORazepam  (ATIVAN ) tablet 1 mg, 1 mg, Oral, Q6H  PRN, Rai, Ripudeep K, MD   multivitamin with minerals tablet 1 tablet, 1 tablet, Oral, Daily, Tobie Mario V, MD, 1 tablet at 02/01/24 0900   nicotine  (NICODERM CQ  - dosed in mg/24 hours) patch 21 mg, 21 mg, Transdermal, Daily, Tobie Mario V, MD, 21 mg at 02/01/24 9093   ondansetron  (ZOFRAN -ODT) disintegrating tablet 4 mg, 4 mg, Oral, Q6H PRN, Rai, Ripudeep K, MD   pantoprazole  (PROTONIX ) EC tablet 40 mg, 40 mg, Oral, BID, Rai, Ripudeep K, MD, 40 mg at 02/01/24 0900   rifaximin  (XIFAXAN ) tablet 550 mg, 550 mg, Oral, BID, Esterwood, Amy S, PA-C, 550 mg at 02/01/24 0900   sodium chloride  flush (NS) 0.9 % injection 3 mL, 3 mL, Intravenous, Q12H, Tobie Mario V, MD, 3 mL at 01/31/24 2132   sodium chloride  flush (NS) 0.9 % injection 3-10 mL, 3-10 mL, Intravenous, Q12H, Tobie Mario V, MD, 10 mL at 02/01/24 0902   sodium chloride  flush (NS) 0.9 % injection 3-10 mL, 3-10 mL, Intravenous, PRN, Tobie Mario GAILS, MD   spironolactone  (ALDACTONE ) tablet 25 mg, 25 mg, Oral, Daily, Rai, Ripudeep K, MD, 25 mg at 02/01/24 0900   tamsulosin  (FLOMAX ) capsule 0.4 mg, 0.4 mg, Oral, Daily, Tobie Mario V, MD, 0.4 mg at 02/01/24 0900   thiamine  (VITAMIN B1) 500 mg in sodium chloride  0.9 % 50 mL IVPB, 500 mg, Intravenous, Q8H, Rai, Ripudeep K, MD, Last Rate: 110 mL/hr at 02/01/24 1318, 500 mg at 02/01/24 1318  Vitals   Vitals:   01/31/24 1949 01/31/24 2017 02/01/24 0400 02/01/24 0525  BP: 134/89 118/84 (!) 132/101 105/84  Pulse: (!) 120 (!) 116 (!) 106 (!) 102  Resp: 18 20 18 18   Temp: (!) 97.3 F (36.3 C) 98.2 F (36.8 C) 97.6 F (36.4 C) 97.7 F (36.5 C)  TempSrc:  Oral    SpO2: 95% 98% 99%   Weight:      Height:        Body mass index is 29.03 kg/m.   Physical Exam   Constitutional: Chronically ill-appearing patient in no acute distress Psych: Affect appropriate to situation.  Eyes: No scleral injection.  HENT: No OP obstruction.  Head: Normocephalic.  Respiratory: Effort normal, non-labored breathing.   Skin: WDI.   Neurologic Examination    NEURO:  Mental Status: Alert and oriented to person and place, disoriented to time and situation.  He is unable to state why he is in the hospital and states his memory is unclear.  3 out of 3 registration, 0 out of 3 recall, unable to name any animals with 4 feet when asked he is able to follow simple step commands but needs multiple prompts for two-step commands Speech/Language: speech is without dysarthria or aphasia, but responses are slow  Cranial Nerves:  II: PERRL.  III, IV, VI: EOMI. Eyelids elevate symmetrically.  V: Sensation is intact to light touch and symmetrical to face.  VII: Smile is symmetrical.  VIII: hearing intact to  voice. IX, X: Phonation is normal.  KP:Dynloizm shrug 5/5. XII: tongue is midline without fasciculations. Motor: To move all 4 extremities with good antigravity strength, no asterixis seen with hands outstretched Tone: is normal and bulk is normal Sensation- Intact to light touch bilaterally.  Coordination: FTN intact bilaterally Gait- deferred  Labs/Imaging/Neurodiagnostic studies   CBC:  Recent Labs  Lab February 12, 2024 0842 01/28/24 0529 01/31/24 0447 02/01/24 0724  WBC 11.0*   < > 9.2 8.2  NEUTROABS 8.1*  --   --   --   HGB 11.3*   < > 10.9* 11.2*  HCT 32.1*   < > 30.1* 31.1*  MCV 92.8   < > 88.8 89.1  PLT 310   < > 225 216   < > = values in this interval not displayed.   Basic Metabolic Panel:  Lab Results  Component Value Date   NA 131 (L) 02/01/2024   K 3.6 02/01/2024   CO2 22 02/01/2024   GLUCOSE 87 02/01/2024   BUN 11 02/01/2024   CREATININE 1.09 02/01/2024   CALCIUM  7.9 (L) 02/01/2024   GFRNONAA >60 02/01/2024   Lipid Panel:  Lab Results  Component Value Date   LDLCALC 207 (H) 09/10/2022   HgbA1c:  Lab Results  Component Value Date   HGBA1C 5.4 09/10/2022   Urine Drug Screen:     Component Value Date/Time   LABOPIA NONE DETECTED 01/28/2024 0635   COCAINSCRNUR NONE DETECTED  01/28/2024 0635   LABBENZ POSITIVE (A) 01/28/2024 0635   AMPHETMU NONE DETECTED 01/28/2024 0635   THCU POSITIVE (A) 01/28/2024 0635   LABBARB NONE DETECTED 01/28/2024 0635    Alcohol Level     Component Value Date/Time   ETH <15 02-12-24 0842   INR  Lab Results  Component Value Date   INR 1.3 (H) 01/29/2024    ASSESSMENT   MEHTAB DOLBERRY is a 58 y.o. male with hx of CAD, syncope, heavy alcohol abuse, anemia, depression, anxiety, alcoholic hepatitis, hyperlipidemia, tobacco use, hypertension, DTs and chronic hyponatremia who presents with acute onset altered mental status after being found confused in the neighbors yard.  He does have a long history of alcohol abuse with current drinking and is on CIWA protocol for alcohol withdrawal.  His ammonia is noted to be elevated to 49, and is appropriately on rifamycin and lactulose .  On exam, he is oriented to person and place but disoriented to time and situation and does not recall what brought him to the hospital.  He does not have asterixis but does have impaired short-term memory.  Current altered mental status is likely a combination of alcohol withdrawal, hepatic encephalopathy with elevated ammonia and Wernicke's encephalopathy.  Would continue treatment with high-dose thiamine .  Agree with palliative consult given patient's history and likely poor prognosis.  RECOMMENDATIONS  -Continue Librium  taper for acute EtOH withdrawal - Recommend complete cessation of alcohol use - Treatment of hepatic encephalopathy and hyperammonemia per primary team - Continue Wernicke's dose thiamine  - Neurology will sign off, please call with questions or concerns ______________________________________________________________________  Patient seen by NP and then by MD, MD to edit note as needed.  Signed, Cortney E Everitt Clint Kill, NP [facility].  NEUROHOSPITALIST ADDENDUM Performed a face to face diagnostic evaluation.   I have reviewed the  contents of history and physical exam as documented by PA/ARNP/Resident and agree with above documentation.  I have discussed and formulated the above plan as documented. Edits to the note have been made as  needed.  Kampbell Holaway, MD [facility] [phone]   If 7pm to 7am, please call on call as listed on AMION.

## 2024-02-01 NOTE — Progress Notes (Addendum)
 Inpatient Progress Note     Patient Profile/Chief Complaint  58 year old male with history of chronic alcohol use, suspected new cirrhosis with portal hypertension/ascites, metabolic encephalopathy-hepatic encephalopathy and Warnicke's encephalopathy -admitted to the hospital with altered mental status, confusion and hallucinations.    Interval History   -- Mental status does not appear to have changed substantially during hospitalization -- Ins and outs are incomplete, appears to be having at least 3 stools a day -- Received high dose IV thiamine  for possible Warnicke's encephalopathy -- Experiencing reaccumulation of abdominal ascites -- Being seen by palliative care    Objective   Vital signs in last 24 hours: Temp:  [97.3 F (36.3 C)-98.3 F (36.8 C)] 97.7 F (36.5 C) (07/20 0525) Pulse Rate:  [102-120] 102 (07/20 0525) Resp:  [18-20] 18 (07/20 0525) BP: (105-134)/(77-101) 105/84 (07/20 0525) SpO2:  [95 %-99 %] 99 % (07/20 0400) Last BM Date : 01/31/24 General:    Thin, chronically ill-appearing gentleman sitting on chair Heart:  Regular rate and rhythm; no murmurs Lungs: Respirations even and unlabored, lungs CTA bilaterally Abdomen:  Soft, nontender and mildly distended normal bowel sounds. Extremities: 1+ edema bilaterally Neurologic: Alert, appears confused when answering questions  Intake/Output from previous day: No intake/output data recorded. Intake/Output this shift: No intake/output data recorded.  Lab Results: Recent Labs    01/30/24 0500 01/31/24 0447 02/01/24 0724  WBC 10.0 9.2 8.2  HGB 11.2* 10.9* 11.2*  HCT 31.7* 30.1* 31.1*  PLT 246 225 216   BMET Recent Labs    01/30/24 0500 01/31/24 0447 02/01/24 0724  NA 132* 131* 131*  K 4.3 3.7 3.6  CL 99 100 99  CO2 24 24 22   GLUCOSE 103* 97 87  BUN 7 8 11   CREATININE 1.10 1.03 1.09  CALCIUM  7.9* 7.7* 7.9*   LFT Recent Labs    02/01/24 0724  PROT 6.2*  ALBUMIN  <1.5*  AST 65*  ALT  35  ALKPHOS 148*  BILITOT 1.0   PT/INR Recent Labs    01/29/24 1430  LABPROT 16.8*  INR 1.3*    Studies/Results: MRI Abdomen 01/29/2024 1. The lesion in the dome of the right hepatic lobe corresponding to the finding on CT scan is most compatible with a benign hemangioma. No other or worrisome focal hepatic lesion is identified. 2. Ascites. 3. Dilated loops of left abdominal small bowel up to 4.0 cm in diameter, possibly ileus or partial small bowel obstruction. 4. Upstream narrowing of the celiac trunk possibly related to median arcuate ligament. Questionable atheromatous narrowing of the proximal SMA although this was less striking on the recent CT scan. 5. Bandlike atelectasis in both lower lobes.    CTAP 01/19/2024 1. Large volume simple free fluid ascites. 2. Question cirrhotic morphology of the liver. Associated 2 cm right posterior hepatic lobe hypodensity-if patient is cirrhotic, a hepatocellular carcinoma cannot be excluded. Correlate with liver function tests. Please note that liver protocol enhanced MR and CT are the most sensitive tests for the screening detection of hepatocellular carcinoma in the high risk setting of cirrhosis. 3. No excretion of intravenous contrast of either kidneys on delayed view. Correlate with renal function tests. 4. Small volume hiatal hernia. 5.  Aortic Atherosclerosis (ICD10-I70.0).    Endoscopic Studies: None   Clinical Impression   58 year old male with history of chronic alcohol use, suspected new cirrhosis with portal hypertension/ascites, metabolic encephalopathy-hepatic encephalopathy and Warnicke's encephalopathy -admitted to the hospital with altered mental status, confusion and hallucinations.  From a  mental status perspective, there has not been substantial change in his his mentation.  No infections or SBP identified to account for change in mental status. On lactulose  and Xifaxan , however, I's and O's are incomplete and  therefore difficult to assess response.  While there could be a component of hepatic encephalopathy, there is clinical suspicion for contribution of Warnicke's encephalopathy for which he has received high-dose IV thiamine .  On physical exam today he appears to be reaccumulating ascites.   Plan  Attempt to obtain strict I's and O's Titrate lactulose  to 3-4 loose bowel movements a day.  Currently receiving 30 g once daily and this can be further increased depending upon bowel frequency. Continue Xifaxan  550 mg p.o. twice daily Increase spironolactone  to 50 mg p.o. daily and Lasix  to 60 mg p.o. daily -monitor renal function with dose titration If ascites continues to reaccumulate despite optimizing diuretics or patient is developing discomfort due to distention consider therapeutic paracentesis from a palliative care perspective. Continue 2 g sodium diet Continue thiamine  replacement Agree with palliative care consult  Dr. Nandigam and Vina Dasen, NP will assume rounding responsibilities 02/02/2024.   LOS: 5 days   Jeffrey Hodges  02/01/2024, 12:36 PM  Jeffrey Hausen, MD [facility]

## 2024-02-01 NOTE — Consult Note (Signed)
 Palliative Medicine Inpatient Consult Note  Consulting Provider: Zelaya, Oscar A, PA-C   Hodges for consult:   Palliative Care Consult Services Palliative Medicine Consult  Hodges for Consult? History of advanced cirrhosis, ascites, severe hypoalbuminemia, hepatic encephalopathy, alcohol withdrawals, needs goals of care   02/01/2024  HPI:  Per intake H&P --> Patient is a 58 year old male with CAD status post stent, history of syncope, history of high risk of falls, history of heavy alcohol abuse, anemia, AKI, depression anxiety, alcoholic hepatitis, hyperlipidemia, tobacco abuse, essential hypertension, history of metabolic encephalopathy suspicious for Wernicke's encephalopathy, history of DTs, chronic hyponatremia, brought to the emergency room via EMS patient was found crawling into the neighbors yard was confused similar episode happened on the 11th where he saw snake in the neighbors yard.  Palliative care asked to support additional goals of care conversations.   Clinical Assessment/Goals of Care:  *Please note that this is a verbal dictation therefore any spelling or grammatical errors are due to the Dragon Medical One system interpretation.  I have reviewed medical records including EPIC notes, labs and imaging, received report from bedside RN, assessed the patient who is lying in bed not noted to be in any distress though at times disoriented during conversation..    I met with Jeffrey Hodges, his father Jeffrey Hodges, his mother Jeffrey Hodges, his wife on speaker phone Jeffrey Hodges, and his stepmother to further discuss diagnosis prognosis, GOC, EOL wishes, disposition and options.   I introduced Palliative Medicine as specialized medical care for people living with serious illness. It focuses on providing relief from the symptoms and stress of a serious illness. The goal is to improve quality of life for both the patient and the family.  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  Social History:  Jeffrey Hodges is from [facility] .  He and his wife have been together for 18 years though married for 14.  They have 2 children together and 2 children who are stepchildren.  He has not been able to work due to his alcohol abuse.  He is not ascribe to a strong religious or belief practice.  Functional and Nutritional State:  Preceding hospitalization Jeffrey Hodges has lived with his wife.  Much of his day is spent in his bed or drinking.  He apparently goes to the liquor store and buys boot leggers which he consumes regularly at increase quantities.  He had recently been hospitalized and after leaving rehabilitation at the end of June has had a decline in mental state.  He also from a nutritional perspective has had a decrease in appetite.  Advance Directives:  A detailed discussion was had today regarding advanced directives.  Jeffrey Hodges is med never made advanced directives though his wife, Jeffrey Hodges is his here get Education officer, environmental.  Code Status:  Concepts specific to code status, artifical feeding and hydration, continued IV antibiotics and rehospitalization was had.  The difference between a aggressive medical intervention path  and a palliative comfort care path for this patient at this time was had.   Encouraged patient/family to consider DNR/DNI status understanding evidenced based poor outcomes in similar hospitalized patient, as the cause of arrest is likely associated with advanced chronic/terminal illness rather than an easily reversible acute cardio-pulmonary event. I explained that DNR/DNI does not change the medical plan and it only comes into effect after a  person has arrested (died).  It is a protective measure to keep us  from harming the patient in  their last moments of life.   [name] and family shared that they need to get together to talk more about this though they do understand if Jeffrey Hodges was to go through cardiopulmonary resuscitation his outlook would likely be very poor.  They did vocalize the amount of trauma that was likely to be caused to him should this occur.  I asked Donovin directly and although he was fairly confused he does say he would not want to live on artificial measures for the rest of his life.  Discussion:  Conversations with patient's wife and family entailed education regarding the significance of alcohol abuse.  We discussed the profound effects it has on the hepatic, neurological, cardiovascular, and gastrointestinal functioning.  We reviewed that often Warnicke's encephalopathy depending on how profound will result in a new baseline level of cognitive functioning.  We also discussed should this worsen what Korsakoff syndrome/psychosis is.  We were reviewed the mortality risk associated with Korsakoff syndrome as well as progressive cirrhotic disease should the patient not treat and should they continue to consume large quantities of alcohol.  We reviewed as well the effect that this will have on functionality and the need for potential help in the home.  [name] shares she is not able to care for Jeffrey Hodges at home we discussed options moving forward inclusive of potential long-term care.  I shared that I would have the social worker speak more to [facility] regarding this.  We discussed what happens to the body when it fails to get the appropriate nutrition and hydration.  We reviewed the mortality associated with this and how artificial nutrition is a short fix for a much larger problem.  Patient's family also endorsed that they need to discuss if this would be desired or not.  The concern associated with patient's long-term health was illuminated in conversation.  Patient's parents understand very clearly that he  has a very poor prognosis and they note it will be very hard for him to stop drinking.  I was assertive in telling Jeffrey Hodges if he continues to do as he has been doing his body will further decline and I do wary that severe damage has already been done.  Patient's family plan to meet among themselves for further conversation regarding goals of care.  Until then the primary care team will continue treating allowing time for outcomes.  From a short-term perspective Sourish will likely require skilled nursing from a longer-term perspective anticipate potential need for home with 24/7 care though this would be a large cost on family and/or custodial placement.  Discussed the importance of continued conversation with family and their  medical providers regarding overall plan of care and treatment options, ensuring decisions are within the context of the patients values and GOCs.  Decision Maker: Jeffrey Hodges, Jeffrey Hodges (Spouse): [phone] (Mobile)   SUMMARY OF RECOMMENDATIONS   Full code/full scope of care --> discussed the thought of a DNR given Hargis's chronic disease burden and frailty.  Was open about his likely outcome should these measures be pursued  Honest conversations held about the severity of Telly's alcohol abuse and long-term impact/effects of this on multiple organ systems  [name] cannot care for Jeffrey Hodges in the home will request social work speak to her about alternative options  Palliative care will continue to follow along with Oneil and offer he and his family support during this difficult time  Code Status/Advance Care Planning: FULL CODE  Palliative Prophylaxis:  Aspiration, Bowel Regimen, Delirium Protocol, Frequent Pain Assessment, Oral Care, Palliative Wound Care, and Turn Reposition  Additional Recommendations (Limitations, Scope, Preferences): Continue current care  Psycho-social/Spiritual:  Desire for further Chaplaincy support: Not at this time Additional Recommendations:  Education on long-term impacts of alcohol abuse   Prognosis: Limited overall high 11-month mortality risk  Discharge Planning: Discharge plan to be determined  Vitals:   02/01/24 0400 02/01/24 0525  BP: (!) 132/101 105/84  Pulse: (!) 106 (!) 102  Resp: 18 18  Temp: 97.6 F (36.4 C) 97.7 F (36.5 C)  SpO2: 99%    No intake or output data in the 24 hours ending 02/01/24 0707 Last Weight  Most recent update: 01/27/2024  8:33 AM    Weight  99.8 kg (220 lb)            Gen: Frail middle-aged Caucasian male chronically ill in appearance HEENT: moist mucous membranes CV: Irregular rate and regular rhythm  PULM: On room air breathing is even and nonlabored ABD: soft/nontender  EXT: No edema  Neuro: Disoriented throughout conversation  PPS: 30-40%   This conversation/these recommendations were discussed with patient primary care team, Dr. Davia ______________________________________________________ Rosaline Becton [place] Palliative Medicine Team Team Cell Phone: [phone] Please utilize secure chat with additional questions, if there is no response within 30 minutes please call the above phone number  Total Time: 75 Billing based on MDM: High  Palliative Medicine Team providers are available by phone from 7am to 7pm daily and can be reached through the team cell phone.  Should this patient require assistance outside of these hours, please call the patient's attending physician.

## 2024-02-01 NOTE — Progress Notes (Signed)
 Patient is a q4 vitals but 12 am vitals not taken because he's sleeping and it's not safe to wake him up now. We will continue to monitor.

## 2024-02-01 NOTE — Progress Notes (Signed)
 [facility]                                                                              Jeffrey Hodges, is a 58 y.o. male, DOB - Jul 02, 1966, FMW:[id number] Admit date - 01/27/2024    Outpatient Primary MD for the patient is [name], NP  LOS - 5  days  No chief complaint on file.      Brief summary   Patient is a 58 year old male with CAD status post stent, history of syncope, history of high risk of falls, history of heavy alcohol abuse, anemia, AKI, depression anxiety, alcoholic hepatitis, hyperlipidemia, tobacco abuse, essential hypertension, history of metabolic encephalopathy suspicious for Wernicke's encephalopathy, history of DTs, chronic hyponatremia, brought to the emergency room via EMS patient was found crawling into the neighbors yard was confused similar episode happened on the 11th where he saw snake in the neighbors yard.  In ED, labs showed sodium 129, potassium 2.7, creatinine 1.1, AST 127, ALT 41, ammonia level 58, total bilirubin 2.4, CBC showed WBCs 5, AFP 12.9 on 30 October 2023  Assessment & Plan    Principal Problem: Acute metabolic encephalopathy, likely hepatic encephalopathy (HCC) - Also suspected Warnicke's encephalopathy. - Ammonia level 58 on admission, received lactulose , thiamine  500 mg x 1 - UDS positive for benzodiazepines, THC - Continue lactulose  20 g twice daily, titrate to 2-3 BMs in a day - Continue rifaximin , started by GI  History of alcohol abuse with concern for Warnicke's encephalopathy -Patient drinks beer and hard liquor daily -Placed on CIWA protocol with Ativan , today more confused and shaky, CIWA 12, family (father and stepmother at bedside) -Received thiamine  500 mg x 1 and then was placed on thiamine  folic acid  MVI - Neurology consult was requested on 7/19, was recommended high-dose IV thiamine  by Dr. Matthews.  Neurology will follow today. - CIWA 15 yesterday evening and 14 earlier this morning, father stepmother  and biological mother at the bedside today. - LFTs are improving, will place on Librium  protocol    Transaminitis, hyperbilirubinemia, severe hypoalbuminemia alcoholic cirrhosis - CT abdomen pelvis on 7/7 had shown large volume ascites, cirrhotic morphology of the liver, 2 cm right posterior hepatic lobe hypodensity, hepatocellular carcinoma cannot be excluded, recommended MRI - Recent AFP 12.9 -  Resumed Lasix  40 mg daily, Aldactone  25 mg daily, lactulose  20 g twice daily, started on rifaximin  - s/p paracentesis, on 7/16 7 L of ascitic fluid removed, studies negative for SBP - MRI liver showed no worrisome focal hepatic lesion, finding on the CT scan compatible with benign hemangioma.  Possible ileus or partial SBO - Patient is having BMs, hence SBO unlikely   Hypokalemia - Replace as needed   Hyponatremia -Likely due to hypervolemia, ascites   Generalized debility - PT recommended SNF - Per family, patient's mental status has been worsening since her previous admission.  He has been confused, forgetful, wandering, low enforcement has been called multiple times by the neighbors.  - Palliative medicine consulted for GOC  Estimated body mass index is 29.03 kg/m as calculated from the following:   Height as of this encounter: 6' 1 (1.854  m).   Weight as of this encounter: 99.8 kg.  Code Status: Full code DVT Prophylaxis:  heparin  injection 5,000 Units Start: 01/27/24 1400   Level of Care: Level of care: Telemetry Medical Family Communication: Updated patient's father, stepmother, biological mother at the bedside and wife on the speaker phone at the same time. Disposition Plan:      Remains inpatient appropriate:   [name] had requested SNF at discharge   Procedures:    Consultants:   GI Neurology  Antimicrobials:   Anti-infectives (From admission, onward)    Start     Dose/Rate Route Frequency Ordered Stop   01/29/24 2200  rifaximin  (XIFAXAN ) tablet 550 mg         550 mg Oral 2 times daily 01/29/24 1700            Medications  atorvastatin   80 mg Oral Daily   busPIRone   10 mg Oral BID   cyanocobalamin   1,000 mcg Oral Daily   FLUoxetine   40 mg Oral Daily   folic acid   1 mg Oral Daily   furosemide   40 mg Oral Daily   heparin   5,000 Units Subcutaneous Q8H   lactulose   30 g Oral Daily   multivitamin with minerals  1 tablet Oral Daily   nicotine   21 mg Transdermal Daily   pantoprazole   40 mg Oral BID   rifaximin   550 mg Oral BID   sodium chloride  flush  3 mL Intravenous Q12H   sodium chloride  flush  3-10 mL Intravenous Q12H   spironolactone   25 mg Oral Daily   tamsulosin   0.4 mg Oral Daily      Subjective:   Jeffrey Hodges was seen and examined today.  Overnight was agitated, CIWA 14 this morning.  Still confused however, this morning eating breakfast at the time of my examination.  Family members at the bedside.  Still remains confused.  Objective:   Vitals:   01/31/24 1949 01/31/24 2017 02/01/24 0400 02/01/24 0525  BP: 134/89 118/84 (!) 132/101 105/84  Pulse: (!) 120 (!) 116 (!) 106 (!) 102  Resp: 18 20 18 18   Temp: (!) 97.3 F (36.3 C) 98.2 F (36.8 C) 97.6 F (36.4 C) 97.7 F (36.5 C)  TempSrc:  Oral    SpO2: 95% 98% 99%   Weight:      Height:       No intake or output data in the 24 hours ending 02/01/24 1241     Wt Readings from Last 3 Encounters:  01/27/24 99.8 kg  01/19/24 87.4 kg  01/07/24 91 kg   Physical Exam General: Alert and oriented to self, place, NAD, appears comfortable, family members at the bedside Cardiovascular: S1 S2 clear, RRR.  Respiratory: CTAB, no wheezing Gastrointestinal: Soft, nontender, ascites, NBS Ext: Trace pedal edema bilaterally Neuro: no new deficits Psych: Some confusion still persisting     Data Reviewed:  I have personally reviewed following labs    CBC Lab Results  Component Value Date   WBC 8.2 02/01/2024   RBC 3.49 (L) 02/01/2024   HGB 11.2 (L) 02/01/2024    HCT 31.1 (L) 02/01/2024   MCV 89.1 02/01/2024   MCH 32.1 02/01/2024   PLT 216 02/01/2024   MCHC 36.0 02/01/2024   RDW 15.9 (H) 02/01/2024   LYMPHSABS 1.5 01/27/2024   MONOABS 1.3 (H) 01/27/2024   EOSABS 0.0 01/27/2024   BASOSABS 0.0 01/27/2024     Last metabolic panel Lab Results  Component Value Date  NA 131 (L) 02/01/2024   K 3.6 02/01/2024   CL 99 02/01/2024   CO2 22 02/01/2024   BUN 11 02/01/2024   CREATININE 1.09 02/01/2024   GLUCOSE 87 02/01/2024   GFRNONAA >60 02/01/2024   CALCIUM  7.9 (L) 02/01/2024   PHOS 3.6 01/31/2024   PROT 6.2 (L) 02/01/2024   ALBUMIN  <1.5 (L) 02/01/2024   BILITOT 1.0 02/01/2024   ALKPHOS 148 (H) 02/01/2024   AST 65 (H) 02/01/2024   ALT 35 02/01/2024   ANIONGAP 10 02/01/2024    CBG (last 3)  No results for input(s): GLUCAP in the last 72 hours.    Coagulation Profile: Recent Labs  Lab 01/29/24 1430  INR 1.3*     Radiology Studies: I have personally reviewed the imaging studies  No results found.      Nydia Distance M.D. [facility] 02/01/2024, 12:41 PM  Available via Epic secure chat 7am-7pm After 7 pm, please refer to night coverage provider listed on amion.

## 2024-02-01 NOTE — Plan of Care (Signed)
  Problem: Pain Managment: Goal: General experience of comfort will improve and/or be controlled Outcome: Progressing   Problem: Safety: Goal: Ability to remain free from injury will improve Outcome: Progressing   Problem: Skin Integrity: Goal: Risk for impaired skin integrity will decrease Outcome: Progressing

## 2024-02-01 NOTE — Progress Notes (Signed)
 Mobility Specialist: Progress Note   02/01/24 1436  Mobility  Activity Ambulated with assistance in hallway  Level of Assistance Contact guard assist, steadying assist  Assistive Device Front wheel walker  Distance Ambulated (ft) 175 ft  Activity Response Tolerated well  Mobility Referral Yes  Mobility visit 1 Mobility  Mobility Specialist Start Time (ACUTE ONLY) 1233  Mobility Specialist Stop Time (ACUTE ONLY) 1243  Mobility Specialist Time Calculation (min) (ACUTE ONLY) 10 min    Pt received in chair, agreeable to mobility session. CGA throughout. Mild unsteadiness and 1x occurrence of scissoring while turning with RW but no overt LOB. No complaints. Returned to room without fault. Left in chair with all needs met, call bell in reach. Chair alarm on, telesitter present.   Ileana Lute Mobility Specialist Please contact via SecureChat or Rehab office at [phone]

## 2024-02-02 ENCOUNTER — Inpatient Hospital Stay (HOSPITAL_COMMUNITY)

## 2024-02-02 ENCOUNTER — Telehealth (HOSPITAL_COMMUNITY): Payer: Self-pay | Admitting: Pharmacy Technician

## 2024-02-02 ENCOUNTER — Other Ambulatory Visit (HOSPITAL_COMMUNITY): Payer: Self-pay

## 2024-02-02 DIAGNOSIS — K7031 Alcoholic cirrhosis of liver with ascites: Secondary | ICD-10-CM

## 2024-02-02 DIAGNOSIS — F1010 Alcohol abuse, uncomplicated: Secondary | ICD-10-CM

## 2024-02-02 DIAGNOSIS — K7290 Hepatic failure, unspecified without coma: Secondary | ICD-10-CM

## 2024-02-02 DIAGNOSIS — Z515 Encounter for palliative care: Secondary | ICD-10-CM | POA: Diagnosis not present

## 2024-02-02 DIAGNOSIS — E876 Hypokalemia: Secondary | ICD-10-CM | POA: Diagnosis not present

## 2024-02-02 DIAGNOSIS — Z7189 Other specified counseling: Secondary | ICD-10-CM | POA: Diagnosis not present

## 2024-02-02 DIAGNOSIS — K7682 Hepatic encephalopathy: Secondary | ICD-10-CM | POA: Diagnosis not present

## 2024-02-02 HISTORY — PX: IR PARACENTESIS: IMG2679

## 2024-02-02 LAB — RENAL FUNCTION PANEL
Albumin: 1.8 g/dL — ABNORMAL LOW (ref 3.5–5.0)
Anion gap: 11 (ref 5–15)
BUN: 8 mg/dL (ref 6–20)
CO2: 21 mmol/L — ABNORMAL LOW (ref 22–32)
Calcium: 7.8 mg/dL — ABNORMAL LOW (ref 8.9–10.3)
Chloride: 101 mmol/L (ref 98–111)
Creatinine, Ser: 1 mg/dL (ref 0.61–1.24)
GFR, Estimated: >60 mL/min (ref >60)
Glucose, Bld: 97 mg/dL (ref 70–99)
Phosphorus: 3.3 mg/dL (ref 2.5–4.6)
Potassium: 3.7 mmol/L (ref 3.5–5.1)
Sodium: 133 mmol/L — ABNORMAL LOW (ref 135–145)

## 2024-02-02 LAB — CBC
HCT: 31.2 % — ABNORMAL LOW (ref 39.0–52.0)
Hemoglobin: 11.2 g/dL — ABNORMAL LOW (ref 13.0–17.0)
MCH: 32.5 pg (ref 26.0–34.0)
MCHC: 35.9 g/dL (ref 30.0–36.0)
MCV: 90.4 fL (ref 80.0–100.0)
Platelets: 206 K/uL (ref 150–400)
RBC: 3.45 MIL/uL — ABNORMAL LOW (ref 4.22–5.81)
RDW: 16.1 % — ABNORMAL HIGH (ref 11.5–15.5)
WBC: 6.6 K/uL (ref 4.0–10.5)
nRBC: 0 % (ref 0.0–0.2)

## 2024-02-02 LAB — MAGNESIUM: Magnesium: 2.1 mg/dL (ref 1.7–2.4)

## 2024-02-02 LAB — CULTURE, BODY FLUID W GRAM STAIN -BOTTLE: Culture: NO GROWTH

## 2024-02-02 LAB — PHOSPHORUS: Phosphorus: 3.4 mg/dL (ref 2.5–4.6)

## 2024-02-02 MED ORDER — LIDOCAINE HCL 1 % IJ SOLN
INTRAMUSCULAR | Status: AC
  Filled 2024-02-02: qty 20

## 2024-02-02 MED ORDER — FUROSEMIDE 40 MG PO TABS
60.0000 mg | ORAL_TABLET | Freq: Every day | ORAL | Status: DC
  Administered 2024-02-02 – 2024-02-05 (×4): 60 mg via ORAL
  Filled 2024-02-02 (×4): qty 1

## 2024-02-02 MED ORDER — LIDOCAINE HCL 1 % IJ SOLN
20.0000 mL | Freq: Once | INTRAMUSCULAR | Status: AC
  Administered 2024-02-02: 20 mL via INTRADERMAL

## 2024-02-02 MED ORDER — SPIRONOLACTONE 25 MG PO TABS
50.0000 mg | ORAL_TABLET | Freq: Every day | ORAL | Status: DC
  Administered 2024-02-02 – 2024-02-05 (×4): 50 mg via ORAL
  Filled 2024-02-02 (×4): qty 2

## 2024-02-02 MED ORDER — ALBUMIN HUMAN 25 % IV SOLN
25.0000 g | Freq: Four times a day (QID) | INTRAVENOUS | Status: AC
  Administered 2024-02-02 – 2024-02-03 (×3): 25 g via INTRAVENOUS
  Filled 2024-02-02 (×3): qty 100

## 2024-02-02 NOTE — Telephone Encounter (Signed)
 Pharmacy Patient Advocate Encounter  Received notification from [facility] that Prior Authorization for Xifaxan  550mg  tablets  has been APPROVED from 02/02/2024 to 02/01/2027. Ran test claim, Copay is $0.00. This test claim was processed through [facility]- copay amounts may vary at other pharmacies due to pharmacy/plan contracts, or as the patient moves through the different stages of their insurance plan.   PA #/Case ID/Reference #: [id number]

## 2024-02-02 NOTE — Telephone Encounter (Signed)
 Pharmacy Patient Advocate Encounter   Received notification from Inpatient Request that prior authorization for Xifaxan  550mg  tablets is required/requested.   Insurance verification completed.   The patient is insured through [facility] .   Per test claim: PA required; PA submitted to above mentioned insurance via LATENT Key/confirmation #/EOC [id number] Status is pending

## 2024-02-02 NOTE — Progress Notes (Signed)
 Physical Therapy Treatment Patient Details Name: Jeffrey Hodges MRN: [id number] DOB: 1965/09/01 Today's Date: 02/02/2024   History of Present Illness 58 y.o. male admitted 7/15 with acute metabolic encephalopathy, likely hepatic encephalopathy. Also suspected Warnicke's encephalopathy. PMH: heart disease status post stent, heavy alcohol abuse, anemia, AKI, depression anxiety, elevated LFTs, hyperlipidemia, tobacco abuse, essential hypertension.    PT Comments  Pt up in chair on arrival, pleasantly confused, and agreeable to session with encouragement. Pt with decreased safety awareness throughout mobility, lifting RW from floor and needing cues and hands on assist to avoid obstacles on L and R. Physically pt requiring grossly min A for transfers and gait with RW for support as pt with increased postural sway and ataxic like gait. Of note, pt with IV out on arrival, RN notified and in room. Patient will benefit from continued inpatient follow up therapy, <3 hours/day to address deficits and maximize functional independence and decrease caregiver burden. Will continue to follow acutely.     If plan is discharge home, recommend the following: Assistance with cooking/housework;Direct supervision/assist for medications management;Direct supervision/assist for financial management;Help with stairs or ramp for entrance;Assist for transportation;Supervision due to cognitive status;A little help with walking and/or transfers;A little help with bathing/dressing/bathroom   Can travel by private vehicle     Yes  Equipment Recommendations  Other (comment)    Recommendations for Other Services       Precautions / Restrictions Precautions Precautions: Fall Recall of Precautions/Restrictions: Impaired Restrictions Weight Bearing Restrictions Per Provider Order: No     Mobility  Bed Mobility Overal bed mobility: Needs Assistance             General bed mobility comments: pt up in chair on  arrival and at end of session    Transfers Overall transfer level: Needs assistance Equipment used: Rolling walker (2 wheels) Transfers: Sit to/from Stand, Bed to chair/wheelchair/BSC Sit to Stand: Min assist           General transfer comment: min A to steady on rise from chair and bench in hall    Ambulation/Gait Ambulation/Gait assistance: Min assist Gait Distance (Feet): 75 Feet (x2) Assistive device: Rolling walker (2 wheels) Gait Pattern/deviations: Step-through pattern, Decreased stride length, Drifts right/left, Wide base of support Gait velocity: decr     General Gait Details: Minor instability with RW for support. fair pace with cues for safety and awareness. A little erratic with RW but without overt LOB, cues to maintain RW in contact with floor   Stairs             Wheelchair Mobility     Tilt Bed    Modified Rankin (Stroke Patients Only)       Balance Overall balance assessment: Needs assistance Sitting-balance support: No upper extremity supported, Feet supported Sitting balance-Leahy Scale: Good Sitting balance - Comments: EOB ADLs   Standing balance support: Bilateral upper extremity supported, During functional activity, Reliant on assistive device for balance Standing balance-Leahy Scale: Poor Standing balance comment: reliant on RW for support today                            Communication Communication Communication: No apparent difficulties  Cognition Arousal: Alert Behavior During Therapy: Flat affect   PT - Cognitive impairments: Awareness, Memory, Attention, Initiation, Sequencing, Problem solving, Safety/Judgement, Orientation   Orientation impairments: Place, Time, Situation  PT - Cognition Comments: pt oriented to self only, unable to navigate from hall back to room without cues, poor safety awareness. of note pt with iv out on arrival to room, stating its part of my game RN notified  and into room Following commands: Impaired Following commands impaired: Only follows one step commands consistently, Follows one step commands with increased time    Cueing Cueing Techniques: Verbal cues, Gestural cues  Exercises      General Comments        Pertinent Vitals/Pain Pain Assessment Pain Assessment: Faces Faces Pain Scale: Hurts a little bit Pain Location: back Pain Descriptors / Indicators: Aching Pain Intervention(s): Monitored during session, Limited activity within patient's tolerance    Home Living                          Prior Function            PT Goals (current goals can now be found in the care plan section) Acute Rehab PT Goals PT Goal Formulation: Patient unable to participate in goal setting Time For Goal Achievement: 02/12/24 Progress towards PT goals: Progressing toward goals    Frequency    Min 2X/week      PT Plan      Co-evaluation              AM-PAC PT 6 Clicks Mobility   Outcome Measure  Help needed turning from your back to your side while in a flat bed without using bedrails?: None Help needed moving from lying on your back to sitting on the side of a flat bed without using bedrails?: A Little Help needed moving to and from a bed to a chair (including a wheelchair)?: A Little Help needed standing up from a chair using your arms (e.g., wheelchair or bedside chair)?: A Little Help needed to walk in hospital room?: A Little Help needed climbing 3-5 steps with a railing? : A Lot 6 Click Score: 18    End of Session Equipment Utilized During Treatment: Gait belt Activity Tolerance: Patient tolerated treatment well Patient left: with call bell/phone within reach;Other (comment);in chair;with chair alarm set;with nursing/sitter in room (with tele-sitter present) Nurse Communication: Mobility status;Other (comment) (IV out on arrival) PT Visit Diagnosis: Unsteadiness on feet (R26.81);Other abnormalities of gait  and mobility (R26.89);Other symptoms and signs involving the nervous system (R29.898);History of falling (Z91.81);Muscle weakness (generalized) (M62.81);Repeated falls (R29.6)     Time: 1534-1550 PT Time Calculation (min) (ACUTE ONLY): 16 min  Charges:    $Gait Training: 8-22 mins PT General Charges $$ ACUTE PT VISIT: 1 Visit                     Soo Steelman R. PTA Acute Rehabilitation Services Office: [phone]   Therisa CHRISTELLA Boor 02/02/2024, 4:07 PM

## 2024-02-02 NOTE — Progress Notes (Signed)
Ultrasound-guided  therapeutic paracentesis performed yielding 4.7 liters of straw colored fluid. No immediate complications. EBL is none.  

## 2024-02-02 NOTE — Progress Notes (Addendum)
 Palliative Medicine Inpatient Follow Up Note HPI:  Patient is a 58 year old male with CAD status post stent, history of syncope, history of high risk of falls, history of heavy alcohol abuse, anemia, AKI, depression anxiety, alcoholic hepatitis, hyperlipidemia, tobacco abuse, essential hypertension, history of metabolic encephalopathy suspicious for Wernicke's encephalopathy, history of DTs, chronic hyponatremia, brought to the emergency room via EMS patient was found crawling into the neighbors yard was confused similar episode happened on the 11th where he saw snake in the neighbors yard.  Palliative care asked to support additional goals of care conversations.   Today's Discussion 02/02/2024  *Please note that this is a verbal dictation therefore any spelling or grammatical errors are due to the Dragon Medical One system interpretation.  Chart reviewed inclusive of vital signs, progress notes, laboratory results, and diagnostic images.   I met with Oneil at bedside. He is awake and alert to self, he is easily disoriented. He believes that children are trying to get out of his bed. He is aware of place though not condition at this time. He shares he has little to no appetite.   I reviewed with Levante his present circumstances though he got disoriented during my time with him.  I called Marks spouse, [name] created space and opportunity for patient to explore thoughts feelings and fears regarding Samuel's current medical situation. We discussed the help he will need in the future and how [name] is the only working party in her home and is not poised for this. She and I reviewed long term care needs - she plans to speak to medicaid to further determine the next best steps to get him enrolled.   We reviewed that [name] and the family met. They are all in agreement with DNAR/DNI code status and would not want to place a short or long term feeding tube in [place]. [name] realizes the severity of Keandre's  disease burden. She shares knowledge of his limited prognosis.  I described hospice as a service for patients who have a life expectancy of 6 months or less. The goal of hospice is the preservation of dignity and quality at the end phases of life. Under hospice care, the focus changes from curative to symptom relief. [name] shares openness to hospice for Kmari though is not able to pay for room and board at a facility. We reviewed Palliative care as an outpatient and transition to hospice once Hutchinson declines further.   [name] shares she needs a terminal illness letter which the hospitalist is aware of.   MSW is working on English as a second language teacher for placement.  Questions and concerns addressed/Palliative Support Provided.   Objective Assessment: Vital Signs Vitals:   02/01/24 2240 02/02/24 0548  BP: 112/75 130/87  Pulse: (!) 103 (!) 102  Resp: 18 18  Temp: 98.4 F (36.9 C) 97.8 F (36.6 C)  SpO2: 96% 99%    Intake/Output Summary (Last 24 hours) at 02/02/2024 1332 Last data filed at 02/02/2024 0900 Gross per 24 hour  Intake 240 ml  Output --  Net 240 ml   Last Weight  Most recent update: 01/27/2024  8:33 AM    Weight  99.8 kg (220 lb)            Gen: Frail middle-aged Caucasian male chronically ill in appearance HEENT: moist mucous membranes CV: Irregular rate and regular rhythm  PULM: On room air breathing is even and nonlabored ABD: soft/nontender  EXT: No edema  Neuro: Disoriented throughout conversation  SUMMARY OF RECOMMENDATIONS  DNAR/DNI  No feeding tubes (short or long term)    Discussed Palliative and Hospice care  MSW to help patients wife determine medicaid process for [facility]  OP Palliative care referral   Palliative care will continue to follow along with Oneil and offer he and his family support during this difficult time ______________________________________________________________________________________ [facility] Palliative  Medicine Team Team Cell Phone: [phone] Please utilize secure chat with additional questions, if there is no response within 30 minutes please call the above phone number  Time Spent: 53 Billing based on MDM: High  Palliative Medicine Team providers are available by phone from 7am to 7pm daily and can be reached through the team cell phone.  Should this patient require assistance outside of these hours, please call the patient's attending physician.

## 2024-02-02 NOTE — Plan of Care (Signed)
   Problem: Health Behavior/Discharge Planning: Goal: Ability to manage health-related needs will improve Outcome: Progressing   Problem: Nutrition: Goal: Adequate nutrition will be maintained Outcome: Progressing

## 2024-02-02 NOTE — Progress Notes (Signed)
 [facility]                                                                              Jeffrey Hodges, is a 58 y.o. male, DOB - 01/02/1966, FMW:[id number] Admit date - 01/27/2024    Outpatient Primary MD for the patient is [name], NP  LOS - 6  days  No chief complaint on file.      Brief summary   Patient is a 58 year old male with CAD status post stent, history of syncope, history of high risk of falls, history of heavy alcohol abuse, anemia, AKI, depression anxiety, alcoholic hepatitis, hyperlipidemia, tobacco abuse, essential hypertension, history of metabolic encephalopathy suspicious for Wernicke's encephalopathy, history of DTs, chronic hyponatremia, brought to the emergency room via EMS patient was found crawling into the neighbors yard was confused similar episode happened on the 11th where he saw snake in the neighbors yard.  In ED, labs showed sodium 129, potassium 2.7, creatinine 1.1, AST 127, ALT 41, ammonia level 58, total bilirubin 2.4, CBC showed WBCs 5, AFP 12.9 on 30 October 2023  Assessment & Plan    Principal Problem: Acute metabolic encephalopathy, likely hepatic encephalopathy (HCC) - Also suspected Warnicke's encephalopathy. - Ammonia level 58 on admission, received lactulose , thiamine  500 mg x 1 - UDS positive for benzodiazepines, THC - Continue lactulose  20 g twice daily, titrate to 2-3 BMs in a day - Continue rifaximin , started by GI  History of alcohol abuse with concern for Warnicke's encephalopathy -Patient drinks beer and hard liquor daily -Placed on CIWA protocol with Ativan , today more confused and shaky, CIWA 12, family (father and stepmother at bedside) -Received thiamine  500 mg x 1 and then was placed on thiamine  folic acid  MVI - Seen by neurology, continue high-dose IV thiamine  and Librium  protocol. - No significant improvement in the mental status since admission, likely his new baseline.   Transaminitis,  hyperbilirubinemia, severe hypoalbuminemia alcoholic cirrhosis - CT abdomen pelvis on 7/7 had shown large volume ascites, cirrhotic morphology of the liver, 2 cm right posterior hepatic lobe hypodensity, hepatocellular carcinoma cannot be excluded, recommended MRI - Recent AFP 12.9 -  Resumed Lasix  40 mg daily, Aldactone  25 mg daily, lactulose  20 g twice daily, started on rifaximin  - s/p paracentesis, on 7/16 7 L of ascitic fluid removed, studies negative for SBP - MRI liver showed no worrisome focal hepatic lesion, finding on the CT scan compatible with benign hemangioma.   - Abdomen getting more distended, ordered US  guided hepatic paracentesis, IV albumin  (patient's family confirmed that he has received blood in the past and did not Jehov[employer]'s Witness) - Increase  Lasix  to 60 mg daily, Aldactone  to 50 mg daily  Hypokalemia - Replace as needed   Hyponatremia -Likely due to hypervolemia, ascites   Generalized debility - PT recommended SNF - Per family, patient's mental status has been worsening since her previous admission.  He has been confused, forgetful, wandering, low enforcement has been called multiple times by the neighbors.  - Palliative medicine consulted for GOC - Discussed goals of care with the patient's family, they requested DNR DNI, no artificial feeding tube.  Estimated  body mass index is 29.03 kg/m as calculated from the following:   Height as of this encounter: 6' 1 (1.854 m).   Weight as of this encounter: 99.8 kg.  Code Status: Full code DVT Prophylaxis:  heparin  injection 5,000 Units Start: 01/27/24 1400   Level of Care: Level of care: Telemetry Medical Family Communication: Updated patient's father, stepmother, biological mother at the bedside Disposition Plan:      Remains inpatient appropriate:   [name] had requested SNF at discharge   Procedures:    Consultants:   GI Neurology  Antimicrobials:   Anti-infectives (From admission, onward)     Start     Dose/Rate Route Frequency Ordered Stop   01/29/24 2200  rifaximin  (XIFAXAN ) tablet 550 mg        550 mg Oral 2 times daily 01/29/24 1700            Medications  atorvastatin   80 mg Oral Daily   busPIRone   10 mg Oral BID   chlordiazePOXIDE   25 mg Oral TID   Followed by   NOREEN ON 02/03/2024] chlordiazePOXIDE   25 mg Oral BH-qamhs   Followed by   NOREEN ON 02/04/2024] chlordiazePOXIDE   25 mg Oral Daily   cyanocobalamin   1,000 mcg Oral Daily   FLUoxetine   40 mg Oral Daily   folic acid   1 mg Oral Daily   furosemide   60 mg Oral Daily   heparin   5,000 Units Subcutaneous Q8H   lactulose   30 g Oral Daily   multivitamin with minerals  1 tablet Oral Daily   nicotine   21 mg Transdermal Daily   pantoprazole   40 mg Oral BID   rifaximin   550 mg Oral BID   sodium chloride  flush  3 mL Intravenous Q12H   sodium chloride  flush  3-10 mL Intravenous Q12H   spironolactone   50 mg Oral Daily   tamsulosin   0.4 mg Oral Daily      Subjective:   Madsen Riddle was seen and examined today.  CIWA 4, family at the bedside, still confused but calm and cooperative.  Abdomen is getting tense.   Objective:   Vitals:   02/01/24 2115 02/01/24 2125 02/01/24 2240 02/02/24 0548  BP:  112/75 112/75 130/87  Pulse: 65 (!) 103 (!) 103 (!) 102  Resp:   18 18  Temp:   98.4 F (36.9 C) 97.8 F (36.6 C)  TempSrc:   Oral Oral  SpO2:   96% 99%  Weight:      Height:       No intake or output data in the 24 hours ending 02/02/24 1202     Wt Readings from Last 3 Encounters:  01/27/24 99.8 kg  01/19/24 87.4 kg  01/07/24 91 kg   Physical Exam General: Alert and oriented x self, place, recognizes his family, not oriented to time and situation. Cardiovascular: S1 S2 clear, RRR.  Respiratory: CTAB Gastrointestinal: Soft, nontender, +distended, NBS Ext: no pedal edema bilaterally Neuro: no new deficits Psych: Confusion persisting    Data Reviewed:  I have personally reviewed following  labs    CBC Lab Results  Component Value Date   WBC 8.2 02/01/2024   RBC 3.49 (L) 02/01/2024   HGB 11.2 (L) 02/01/2024   HCT 31.1 (L) 02/01/2024   MCV 89.1 02/01/2024   MCH 32.1 02/01/2024   PLT 216 02/01/2024   MCHC 36.0 02/01/2024   RDW 15.9 (H) 02/01/2024   LYMPHSABS 1.5 01/27/2024   MONOABS 1.3 (H) 01/27/2024  EOSABS 0.0 01/27/2024   BASOSABS 0.0 01/27/2024     Last metabolic panel Lab Results  Component Value Date   NA 131 (L) 02/01/2024   K 3.6 02/01/2024   CL 99 02/01/2024   CO2 22 02/01/2024   BUN 11 02/01/2024   CREATININE 1.09 02/01/2024   GLUCOSE 87 02/01/2024   GFRNONAA >60 02/01/2024   CALCIUM  7.9 (L) 02/01/2024   PHOS 3.6 01/31/2024   PROT 6.2 (L) 02/01/2024   ALBUMIN  <1.5 (L) 02/01/2024   BILITOT 1.0 02/01/2024   ALKPHOS 148 (H) 02/01/2024   AST 65 (H) 02/01/2024   ALT 35 02/01/2024   ANIONGAP 10 02/01/2024    CBG (last 3)  No results for input(s): GLUCAP in the last 72 hours.    Coagulation Profile: Recent Labs  Lab 01/29/24 1430  INR 1.3*     Radiology Studies: I have personally reviewed the imaging studies  No results found.      Nydia Distance M.D. [facility] 02/02/2024, 12:02 PM  Available via Epic secure chat 7am-7pm After 7 pm, please refer to night coverage provider listed on amion.

## 2024-02-02 NOTE — Progress Notes (Signed)
 [facility] ROUNDING NOTE   Subjective: Feels better status post paracentesis.  Abdominal distention has improved.  He is tolerating diet   Objective: Vital signs in last 24 hours: Temp:  [97.8 F (36.6 C)-98.4 F (36.9 C)] 97.8 F (36.6 C) (07/21 0548) Pulse Rate:  [65-109] 102 (07/21 0548) Resp:  [16-18] 18 (07/21 0548) BP: (112-130)/(75-99) 130/87 (07/21 0548) SpO2:  [96 %-99 %] 99 % (07/21 0548) Last BM Date : 01/31/24 General: NAD, alert, oriented x 2, no asterixis Abdomen: Soft, no distention or tenderness Ext: No edema    Intake/Output from previous day: No intake/output data recorded. Intake/Output this shift: Total I/O In: 240 [P.O.:240] Out: -    Lab Results: Recent Labs    01/31/24 0447 02/01/24 0724  WBC 9.2 8.2  HGB 10.9* 11.2*  PLT 225 216  MCV 88.8 89.1   BMET Recent Labs    01/31/24 0447 02/01/24 0724  NA 131* 131*  K 3.7 3.6  CL 100 99  CO2 24 22  GLUCOSE 97 87  BUN 8 11  CREATININE 1.03 1.09  CALCIUM  7.7* 7.9*   LFT Recent Labs    01/31/24 0447 02/01/24 0724  PROT 5.8* 6.2*  ALBUMIN  <1.5* <1.5*  AST 56* 65*  ALT 30 35  ALKPHOS 135* 148*  BILITOT 1.3* 1.0   PT/INR No results for input(s): INR in the last 72 hours.    Imaging/Other results: IR Paracentesis Result Date: 02/02/2024 INDICATION: 58 year old male. History of alcohol use and hepatic encephalopathy. Request is for therapeutic paracentesis EXAM: ULTRASOUND GUIDED THERAPEUTIC RIGHT-SIDED PARACENTESIS MEDICATIONS: Lidocaine  1% 10 mL COMPLICATIONS: None immediate. PROCEDURE: Informed written consent was obtained from the patient after a discussion of the risks, benefits and alternatives to treatment. A timeout was performed prior to the initiation of the procedure. Initial ultrasound scanning demonstrates a large amount of ascites within the right lower abdominal quadrant. The right lower abdomen was prepped and draped in the usual sterile fashion. 1%  lidocaine  was used for local anesthesia. Following this, a 19 gauge, 7-cm, Yueh catheter was introduced. An ultrasound image was saved for documentation purposes. The paracentesis was performed. The catheter was removed and a dressing was applied. The patient tolerated the procedure well without immediate post procedural complication. Patient received post-procedure intravenous albumin ; see nursing notes for details. FINDINGS: A total of approximately 4.7 L of straw-colored fluid was removed. IMPRESSION: Successful ultrasound-guided therapeutic left-sided paracentesis yielding 4.7 liters of peritoneal fluid. Performed by Delon Beagle NP Electronically Signed   By: Wilkie Lent M.D.   On: 02/02/2024 13:07      Assessment &Plan  58 year old male with heavy alcohol use, Warnicke's encephalopathy decompensated cirrhosis with ascites, hepatic encephalopathy MELD 3.0: 18 at 01/31/2024  4:47 AM  Status post paracentesis, negative for SBP Volume overload with recurrent ascites, increase diuretics to Lasix  80 mg daily and Aldactone  200 mg daily Follow-up daily BMP  Mild elevation in AFP, negative for focal hepatic lesions concerning for hepatocellular carcinoma  On thiamine , folate and multivitamins for chronic alcohol abuse and nutritional deficiency /Warnicke's encephalopathy  Continue Xifaxan  and lactulose  with goal 3 bowel movements per day for hepatic encephalopathy  Hemoglobin stable, no signs of overt GI bleed  GI signing off, available if have any questions   K. Wilkie Mcgee , MD [phone]  [facility]

## 2024-02-02 NOTE — TOC Progression Note (Signed)
 Transition of Care [facility]) - Progression Note    Patient Details  Name: Jeffrey Hodges MRN: [id number] Date of Birth: February 14, 1966  [facility] CM/SW Contact  Clemma Johnsen A [place], LCSW Phone Number: 02/02/2024, 3:08 PM  Clinical Narrative:     CSW spoke with [name] at [facility], she stated that [place] had started insurance authorization and is currently pending.   CSW spoke with pt's wife, [name], discussed disposition plan, Medicaid vs. Pt's current health insurance. She said that she has an [employer] account planner that has been helping the family with that and can reach out if needed for further insurance needs. CSW also notified her that pt's auth pending at facility and will update on status when available.    TOC will continue to follow.   Expected Discharge Plan: Skilled Nursing Facility Barriers to Discharge: Continued Medical Work up, English as a second language teacher  Expected Discharge Plan and Services     Post Acute Care Choice: Skilled Nursing Facility Living arrangements for the past 2 months: Single Family Home                                       Social Determinants of Health (SDOH) Interventions SDOH Screenings   Food Insecurity: No Food Insecurity (12/26/2023)  Housing: Unknown (12/26/2023)  Transportation Needs: No Transportation Needs (12/26/2023)  Utilities: Not At Risk (12/26/2023)  Depression (PHQ2-9): Medium Risk (01/19/2024)  Financial Resource Strain: Medium Risk (03/24/2023)  Social Connections: Unknown (03/24/2023)  Stress: Stress Concern Present (03/24/2023)  Tobacco Use: Medium Risk (01/27/2024)    Readmission Risk Interventions     No data to display

## 2024-02-03 DIAGNOSIS — Z515 Encounter for palliative care: Secondary | ICD-10-CM | POA: Diagnosis not present

## 2024-02-03 DIAGNOSIS — E876 Hypokalemia: Secondary | ICD-10-CM | POA: Diagnosis not present

## 2024-02-03 DIAGNOSIS — Z7189 Other specified counseling: Secondary | ICD-10-CM | POA: Diagnosis not present

## 2024-02-03 DIAGNOSIS — K7682 Hepatic encephalopathy: Secondary | ICD-10-CM | POA: Diagnosis not present

## 2024-02-03 LAB — RENAL FUNCTION PANEL
Albumin: 1.9 g/dL — ABNORMAL LOW (ref 3.5–5.0)
Anion gap: 7 (ref 5–15)
BUN: 7 mg/dL (ref 6–20)
CO2: 24 mmol/L (ref 22–32)
Calcium: 7.8 mg/dL — ABNORMAL LOW (ref 8.9–10.3)
Chloride: 98 mmol/L (ref 98–111)
Creatinine, Ser: 0.81 mg/dL (ref 0.61–1.24)
GFR, Estimated: >60 mL/min (ref >60)
Glucose, Bld: 80 mg/dL (ref 70–99)
Phosphorus: 2.8 mg/dL (ref 2.5–4.6)
Potassium: 3.1 mmol/L — ABNORMAL LOW (ref 3.5–5.1)
Sodium: 129 mmol/L — ABNORMAL LOW (ref 135–145)

## 2024-02-03 LAB — CBC
HCT: 29 % — ABNORMAL LOW (ref 39.0–52.0)
Hemoglobin: 10.5 g/dL — ABNORMAL LOW (ref 13.0–17.0)
MCH: 32.2 pg (ref 26.0–34.0)
MCHC: 36.2 g/dL — ABNORMAL HIGH (ref 30.0–36.0)
MCV: 89 fL (ref 80.0–100.0)
Platelets: 199 K/uL (ref 150–400)
RBC: 3.26 MIL/uL — ABNORMAL LOW (ref 4.22–5.81)
RDW: 15.9 % — ABNORMAL HIGH (ref 11.5–15.5)
WBC: 6.4 K/uL (ref 4.0–10.5)
nRBC: 0 % (ref 0.0–0.2)

## 2024-02-03 MED ORDER — THIAMINE MONONITRATE 100 MG PO TABS
100.0000 mg | ORAL_TABLET | Freq: Every day | ORAL | Status: DC
  Administered 2024-02-03 – 2024-02-05 (×3): 100 mg via ORAL
  Filled 2024-02-03 (×2): qty 1

## 2024-02-03 MED ORDER — DIVALPROEX SODIUM 500 MG PO DR TAB
500.0000 mg | DELAYED_RELEASE_TABLET | Freq: Three times a day (TID) | ORAL | Status: DC
  Administered 2024-02-03 – 2024-02-05 (×6): 500 mg via ORAL
  Filled 2024-02-03 (×8): qty 1

## 2024-02-03 MED ORDER — POTASSIUM CHLORIDE CRYS ER 20 MEQ PO TBCR
40.0000 meq | EXTENDED_RELEASE_TABLET | Freq: Once | ORAL | Status: AC
  Administered 2024-02-03: 40 meq via ORAL
  Filled 2024-02-03: qty 2

## 2024-02-03 MED ORDER — OLANZAPINE 5 MG PO TABS: 7.5000 mg | ORAL_TABLET | Freq: Every day | ORAL | Status: DC

## 2024-02-03 MED ORDER — ORAL CARE MOUTH RINSE
15.0000 mL | OROMUCOSAL | Status: DC | PRN
Start: 2024-02-03 — End: 2024-02-05

## 2024-02-03 NOTE — Progress Notes (Signed)
 Occupational Therapy Treatment Patient Details Name: Jeffrey Hodges MRN: [id number] DOB: 11-Dec-1965 Today's Date: 02/03/2024   History of present illness 58 y.o. male admitted 7/15 with acute metabolic encephalopathy, likely hepatic encephalopathy. Also suspected Warnicke's encephalopathy. PMH: heart disease status post stent, heavy alcohol abuse, anemia, AKI, depression anxiety, elevated LFTs, hyperlipidemia, tobacco abuse, essential hypertension.   OT comments  Pt in bed upon therapy arrival and agreeable to participate in OT treatment session. Session focused on functional mobility within room, safety awareness with RW use, and cognitive skills development while completing ADL tasks. Pt demonstrated increased difficulty with recall and carry over of education/cueing provided during session. Attempted to leave RW several times during session. Patient will benefit from continued inpatient follow up therapy, <3 hours/day.        If plan is discharge home, recommend the following:  A little help with walking and/or transfers;A little help with bathing/dressing/bathroom;Assistance with cooking/housework;Assist for transportation;Help with stairs or ramp for entrance   Equipment Recommendations  None recommended by OT       Precautions / Restrictions Precautions Precautions: Fall Recall of Precautions/Restrictions: Impaired Restrictions Weight Bearing Restrictions Per Provider Order: No       Mobility Bed Mobility Overal bed mobility: Needs Assistance Bed Mobility: Supine to Sit   Sidelying to sit: Supervision, HOB elevated, Used rails       General bed mobility comments: Pt required no physical assist to complete transition to sitting on EOB. VC provided to initiate task only    Transfers Overall transfer level: Needs assistance Equipment used: Rolling walker (2 wheels) Transfers: Sit to/from Stand, Bed to chair/wheelchair/BSC Sit to Stand: Contact guard assist     Step  pivot transfers: Contact guard assist     General transfer comment: Due to ataxic gait and lack of safety awareness with RW management, pt required CGA to complete all transfers and functional mobility within room. Pt provided directional cues during session although demonstrated difficulty with carry over.     Balance Overall balance assessment: Needs assistance Sitting-balance support: No upper extremity supported, Feet supported Sitting balance-Leahy Scale: Good Sitting balance - Comments: able to straighten socks on feet while seated EOB.   Standing balance support: Bilateral upper extremity supported, During functional activity, Reliant on assistive device for balance Standing balance-Leahy Scale: Fair        ADL either performed or assessed with clinical judgement   ADL Overall ADL's : Needs assistance/impaired     Grooming: Wash/dry hands;Supervision/safety;Standing;Cueing for sequencing        Toilet Transfer: Minimal assistance;Rolling walker (2 wheels);Regular Toilet;Ambulation                   Communication Communication Communication: No apparent difficulties   Cognition Arousal: Alert Behavior During Therapy: Flat affect Cognition: Cognition impaired   Orientation impairments: Time, Situation Awareness: Intellectual awareness impaired, Online awareness impaired Memory impairment (select all impairments): Short-term memory, Working [employer] fast streamer, Conservation officer, historic buildings Attention impairment (select first level of impairment): Sustained attention Executive functioning impairment (select all impairments): Problem solving, Sequencing, Reasoning    Following commands: Impaired Following commands impaired: Follows one step commands inconsistently      Cueing   Cueing Techniques: Verbal cues, Tactile cues             Pertinent Vitals/ Pain       Pain Assessment Pain Assessment: 0-10 Pain Score: 0-No pain         Frequency  Min 2X/week  Progress Toward Goals  OT Goals(current goals can now be found in the care plan section)  Progress towards OT goals: Progressing toward goals            AM-PAC OT 6 Clicks Daily Activity     Outcome Measure   Help from another person eating meals?: None Help from another person taking care of personal grooming?: A Little Help from another person toileting, which includes using toliet, bedpan, or urinal?: A Little Help from another person bathing (including washing, rinsing, drying)?: A Little Help from another person to put on and taking off regular upper body clothing?: A Little Help from another person to put on and taking off regular lower body clothing?: A Little 6 Click Score: 19    End of Session Equipment Utilized During Treatment: Rolling walker (2 wheels)  OT Visit Diagnosis: Unsteadiness on feet (R26.81);Other abnormalities of gait and mobility (R26.89);Other symptoms and signs involving cognitive function;Muscle weakness (generalized) (M62.81);History of falling (Z91.81)   Activity Tolerance Patient tolerated treatment well   Patient Left in chair;with call bell/phone within reach;with chair alarm set;with nursing/sitter in room           Time: 8389-8377 OT Time Calculation (min): 12 min  Charges: OT General Charges $OT Visit: 1 Visit OT Treatments $Self Care/Home Management : 8-22 mins  Leita Howell, OTR/L,CBIS  Supplemental OT - [facility] and [facility] Secure Chat Preferred    Jakaria Lavergne, Leita BIRCH 02/03/2024, 4:40 PM

## 2024-02-03 NOTE — Progress Notes (Signed)
 [facility]                                                                              Jeffrey Hodges, is a 58 y.o. male, DOB - 05-17-1966, FMW:[id number] Admit date - 01/27/2024    Outpatient Primary MD for the patient is Jeffrey Lynwood HERO, NP  LOS - 7  days  No chief complaint on file.      Brief summary   Patient is a 58 year old male with CAD status post stent, history of syncope, history of high risk of falls, history of heavy alcohol abuse, anemia, AKI, depression anxiety, alcoholic hepatitis, hyperlipidemia, tobacco abuse, essential hypertension, history of metabolic encephalopathy suspicious for Wernicke's encephalopathy, history of DTs, chronic hyponatremia, brought to the emergency room via EMS patient was found crawling into the neighbors yard was confused similar episode happened on the 11th where he saw snake in the neighbors yard.  In ED, labs showed sodium 129, potassium 2.7, creatinine 1.1, AST 127, ALT 41, ammonia level 58, total bilirubin 2.4, CBC showed WBCs 5, AFP 12.9 on 30 October 2023  TeleSitter removed, awaiting SNF  Assessment & Plan    Principal Problem: Acute metabolic encephalopathy, likely hepatic encephalopathy (HCC) - Also suspected Warnicke's encephalopathy. - Ammonia level 58 on admission, received lactulose , thiamine  500 mg x 1 - UDS positive for benzodiazepines, THC - Continue lactulose  20 g twice daily, titrate to 2-3 BMs in a day - Continue rifaximin , started by GI  History of alcohol abuse with concern for Warnicke's encephalopathy -Patient drinks beer and hard liquor daily -Placed on CIWA protocol with Ativan , today more confused and shaky, CIWA 12, family (father and stepmother at bedside) -Received thiamine  500 mg x 1 and then was placed on thiamine  folic acid  MVI - Seen by neurology, placed on high-dose IV thiamine  x 3 days, completed today.  No significant improvement.  Will continue thiamine  100 mg p.o. daily -Continue  Librium  protocol - No significant improvement in the mental status since admission, likely his new baseline.  - Started on 500 mg p.o. every 8 hours by palliative for agitation  Transaminitis, hyperbilirubinemia, severe hypoalbuminemia alcoholic cirrhosis - CT abdomen pelvis on 7/7 had shown large volume ascites, cirrhotic morphology of the liver, 2 cm right posterior hepatic lobe hypodensity, hepatocellular carcinoma cannot be excluded, recommended MRI - Recent AFP 12.9 -  s/p paracentesis, on 7/16 7 L of ascitic fluid removed, studies negative for SBP - MRI liver showed no worrisome focal hepatic lesion, finding on the CT scan compatible with benign hemangioma.   -Recommend repeat paracentesis on 7/21, 4.7 L ascitic fluid removed, received IV albumin  x 3 - Increased  Lasix  to 60 mg daily, Aldactone  to 50 mg daily, continue rifaximin   Hypokalemia - Replaced   Hyponatremia -Likely due to hypervolemia, ascites   Generalized debility - PT recommended SNF - Per family, patient's mental status has been worsening since her previous admission.  He has been confused, forgetful, wandering, low enforcement has been called multiple times by the neighbors.  - Palliative medicine consulted for GOC, DNR DNI, no artificial feeding tube.  Estimated body mass index is 21.23 kg/m  as calculated from the following:   Height as of this encounter: 6' 1 (1.854 m).   Weight as of this encounter: 73 kg.  Code Status: Full code DVT Prophylaxis:  heparin  injection 5,000 Units Start: 01/27/24 1400   Level of Care: Level of care: Telemetry Medical Family Communication: Updated patient's father, stepmother, biological mother at the bedside today disposition Plan:      Remains inpatient appropriate:   [name] had requested SNF at discharge   Procedures:    Consultants:   GI Neurology  Antimicrobials:   Anti-infectives (From admission, onward)    Start     Dose/Rate Route Frequency Ordered  Stop   01/29/24 2200  rifaximin  (XIFAXAN ) tablet 550 mg        550 mg Oral 2 times daily 01/29/24 1700            Medications  atorvastatin   80 mg Oral Daily   busPIRone   10 mg Oral BID   chlordiazePOXIDE   25 mg Oral BH-qamhs   Followed by   NOREEN ON 02/04/2024] chlordiazePOXIDE   25 mg Oral Daily   cyanocobalamin   1,000 mcg Oral Daily   divalproex   500 mg Oral Q8H   FLUoxetine   40 mg Oral Daily   folic acid   1 mg Oral Daily   furosemide   60 mg Oral Daily   heparin   5,000 Units Subcutaneous Q8H   lactulose   30 g Oral Daily   multivitamin with minerals  1 tablet Oral Daily   nicotine   21 mg Transdermal Daily   pantoprazole   40 mg Oral BID   rifaximin   550 mg Oral BID   sodium chloride  flush  3 mL Intravenous Q12H   sodium chloride  flush  3-10 mL Intravenous Q12H   spironolactone   50 mg Oral Daily   tamsulosin   0.4 mg Oral Daily      Subjective:   Jeffrey Hodges was seen and examined today.  Family at the bedside, calm and cooperative at this time.  Feels better after paracentesis yesterday.    Objective:   Vitals:   02/03/24 0000 02/03/24 0500 02/03/24 0515 02/03/24 0844  BP: 131/67  122/67 105/71  Pulse: 88  81 95  Resp:    16  Temp: 98.2 F (36.8 C)  97.9 F (36.6 C)   TempSrc: Oral  Oral   SpO2: 96%  97% 98%  Weight:  73 kg    Height:        Intake/Output Summary (Last 24 hours) at 02/03/2024 1405 Last data filed at 02/02/2024 1500 Gross per 24 hour  Intake 477.69 ml  Output --  Net 477.69 ml       Wt Readings from Last 3 Encounters:  02/03/24 73 kg  01/19/24 87.4 kg  01/07/24 91 kg   Physical Exam General: Alert and oriented x self, place Cardiovascular: S1 S2 clear, RRR.  Respiratory: CTAB, no wheezing, rales or rhonchi Gastrointestinal: Soft, nontender, distended, NBS Ext:  1+ pedal edema bilaterally Neuro: no new deficits Psych: somewhat confused    Data Reviewed:  I have personally reviewed following labs    CBC Lab Results   Component Value Date   WBC 6.4 02/03/2024   RBC 3.26 (L) 02/03/2024   HGB 10.5 (L) 02/03/2024   HCT 29.0 (L) 02/03/2024   MCV 89.0 02/03/2024   MCH 32.2 02/03/2024   PLT 199 02/03/2024   MCHC 36.2 (H) 02/03/2024   RDW 15.9 (H) 02/03/2024   LYMPHSABS 1.5 01/27/2024   MONOABS 1.3 (  H) 01/27/2024   EOSABS 0.0 01/27/2024   BASOSABS 0.0 01/27/2024     Last metabolic panel Lab Results  Component Value Date   NA 129 (L) 02/03/2024   K 3.1 (L) 02/03/2024   CL 98 02/03/2024   CO2 24 02/03/2024   BUN 7 02/03/2024   CREATININE 0.81 02/03/2024   GLUCOSE 80 02/03/2024   GFRNONAA >60 02/03/2024   CALCIUM  7.8 (L) 02/03/2024   PHOS 2.8 02/03/2024   PROT 6.2 (L) 02/01/2024   ALBUMIN  1.9 (L) 02/03/2024   BILITOT 1.0 02/01/2024   ALKPHOS 148 (H) 02/01/2024   AST 65 (H) 02/01/2024   ALT 35 02/01/2024   ANIONGAP 7 02/03/2024    CBG (last 3)  No results for input(s): GLUCAP in the last 72 hours.    Coagulation Profile: Recent Labs  Lab 01/29/24 1430  INR 1.3*     Radiology Studies: I have personally reviewed the imaging studies  IR Paracentesis Result Date: 02/02/2024 INDICATION: 58 year old male. History of alcohol use and hepatic encephalopathy. Request is for therapeutic paracentesis EXAM: ULTRASOUND GUIDED THERAPEUTIC RIGHT-SIDED PARACENTESIS MEDICATIONS: Lidocaine  1% 10 mL COMPLICATIONS: None immediate. PROCEDURE: Informed written consent was obtained from the patient after a discussion of the risks, benefits and alternatives to treatment. A timeout was performed prior to the initiation of the procedure. Initial ultrasound scanning demonstrates a large amount of ascites within the right lower abdominal quadrant. The right lower abdomen was prepped and draped in the usual sterile fashion. 1% lidocaine  was used for local anesthesia. Following this, a 19 gauge, 7-cm, Yueh catheter was introduced. An ultrasound image was saved for documentation purposes. The paracentesis was  performed. The catheter was removed and a dressing was applied. The patient tolerated the procedure well without immediate post procedural complication. Patient received post-procedure intravenous albumin ; see nursing notes for details. FINDINGS: A total of approximately 4.7 L of straw-colored fluid was removed. IMPRESSION: Successful ultrasound-guided therapeutic left-sided paracentesis yielding 4.7 liters of peritoneal fluid. Performed by Delon Beagle NP Electronically Signed   By: Wilkie Lent M.D.   On: 02/02/2024 13:07        Unika Nazareno M.D. [facility] 02/03/2024, 2:05 PM  Available via Epic secure chat 7am-7pm After 7 pm, please refer to night coverage provider listed on amion.

## 2024-02-03 NOTE — Progress Notes (Addendum)
 Palliative Medicine Inpatient Follow Up Note HPI:  Patient is a 58 year old male with CAD status post stent, history of syncope, history of high risk of falls, history of heavy alcohol abuse, anemia, AKI, depression anxiety, alcoholic hepatitis, hyperlipidemia, tobacco abuse, essential hypertension, history of metabolic encephalopathy suspicious for Wernicke's encephalopathy, history of DTs, chronic hyponatremia, brought to the emergency room via EMS patient was found crawling into the neighbors yard was confused similar episode happened on the 11th where he saw snake in the neighbors yard.  Palliative care asked to support additional goals of care conversations.   Today's Discussion 02/03/2024  *Please note that this is a verbal dictation therefore any spelling or grammatical errors are due to the Dragon Medical One system interpretation.  Chart reviewed inclusive of vital signs, progress notes, laboratory results, and diagnostic images.   I met with Romello, his father Aubrey Voong, his mother Orie Sharps, his wife on speaker phone Brinson Tozzi, and his stepmother. We discussed that his symptoms do seem to be under better control at this time. He still gets agitated in the evening hours. Patients family note an abundance of hallucinations that are now taking place.We reviewed starting olanzapine  at night to see if these are better controlled.   We reviewed what Wernicke-Korsakoff syndrome is and how Daylon is likely at a new BL level of cognitive functioning. We discussed patients lack of appetite and desire to consume nutrition. We reviewed the potential relationship to the injury which has been done to his brain from the ETOH abuse. We discussed the possibilities if this neglects to improve inclusive of the likely future need for hospice.   Reviewed with family the plan for long term placement likely in the oncoming days.  Provided by primary medical team with a letter of illness as requested  for insurance purposes.  ___________________ Addendum:  Due to prolonged Qtc will give Depakote  as opposed to olanzapine .  Questions and concerns addressed/Palliative Support Provided.   Objective Assessment: Vital Signs Vitals:   02/03/24 0515 02/03/24 0844  BP: 122/67 105/71  Pulse: 81 95  Resp:  16  Temp: 97.9 F (36.6 C)   SpO2: 97% 98%    Intake/Output Summary (Last 24 hours) at 02/03/2024 9046 Last data filed at 02/02/2024 1500 Gross per 24 hour  Intake 477.69 ml  Output --  Net 477.69 ml   Last Weight  Most recent update: 02/03/2024  5:17 AM    Weight  73 kg (160 lb 15 oz)            Gen: Frail middle-aged Caucasian male chronically ill in appearance HEENT: moist mucous membranes CV: Irregular rate and regular rhythm  PULM: On room air breathing is even and nonlabored ABD: soft/nontender  EXT: No edema  Neuro: Disoriented throughout conversation  SUMMARY OF RECOMMENDATIONS   DNAR/DNI  No feeding tubes (short or long term)    OP Palliative care referral  Plan for SNF placement in the oncoming day(s)  Will start Depakote  for behavioral symptoms   Palliative care will continue to follow along with Oneil and offer he and his family support during this difficult time ______________________________________________________________________________________ Rosaline Becton [place] Palliative Medicine Team Team Cell Phone: [phone] Please utilize secure chat with additional questions, if there is no response within 30 minutes please call the above phone number  Billing based on MDM: High  Palliative Medicine Team providers are available by phone from 7am to 7pm daily and can be reached through the team cell phone.  Should this patient require assistance outside of these hours, please call the patient's attending physician.

## 2024-02-03 NOTE — Plan of Care (Signed)
   Problem: Nutrition: Goal: Adequate nutrition will be maintained Outcome: Progressing

## 2024-02-03 NOTE — TOC Progression Note (Addendum)
 Transition of Care [facility]) - Progression Note    Patient Details  Name: Jeffrey Hodges MRN: [id number] Date of Birth: [birth year]  [facility]) CM/SW Contact  Natha Guin A [place], LCSW Phone Number: 02/03/2024, 10:54 AM  Clinical Narrative:     CSW spoke with [name] at [facility]. She said that pt's insurance authorization was approved for placement. CSW sent referral to [facility] for outpatient palliative services.   CSW spoke with pt's wife [name] to provide update. Discussed possible plan for long term care Medicaid, recommended discussing process with facility once pt has been discharged to [facility].   CSW notified pt's treatment team. EDD pending.    TOC will continue to follow.  Expected Discharge Plan: Skilled Nursing Facility Barriers to Discharge: Continued Medical Work up, English as a second language teacher  Expected Discharge Plan and Services     Post Acute Care Choice: Skilled Nursing Facility Living arrangements for the past 2 months: Single Family Home                                       Social Determinants of Health (SDOH) Interventions SDOH Screenings   Food Insecurity: Patient Unable To Answer (02/02/2024)  Housing: Patient Unable To Answer (02/02/2024)  Transportation Needs: No Transportation Needs (12/26/2023)  Utilities: Not At Risk (12/26/2023)  Depression (PHQ2-9): Medium Risk (01/19/2024)  Financial Resource Strain: Medium Risk (03/24/2023)  Social Connections: Unknown (03/24/2023)  Stress: Stress Concern Present (03/24/2023)  Tobacco Use: Medium Risk (01/27/2024)    Readmission Risk Interventions     No data to display

## 2024-02-03 NOTE — Progress Notes (Signed)
 [facility] Liaison Note   Notified by Rosaline Mustache of patient/family request for [facility] Palliative services at [facility] after discharge.  Please call with any hospice or outpatient palliative care related questions. Thank you for the opportunity to participate in this patient's care.   Thank you, Eleanor Nail, [facility] Liaison [phone]

## 2024-02-03 NOTE — Plan of Care (Signed)
?  Problem: Education: ?Goal: Knowledge of General Education information will improve ?Description: Including pain rating scale, medication(s)/side effects and non-pharmacologic comfort measures ?Outcome: Not Progressing ?  ?Problem: Health Behavior/Discharge Planning: ?Goal: Ability to manage health-related needs will improve ?Outcome: Not Progressing ?  ?Problem: Clinical Measurements: ?Goal: Ability to maintain clinical measurements within normal limits will improve ?Outcome: Progressing ?Goal: Will remain free from infection ?Outcome: Progressing ?  ?

## 2024-02-03 NOTE — Progress Notes (Signed)
 Tele sitter was discontinued.

## 2024-02-03 NOTE — Progress Notes (Signed)
 Mobility Specialist Progress Note:    02/03/24 1142  Mobility  Activity Ambulated with assistance in hallway  Level of Assistance Contact guard assist, steadying assist  Assistive Device Front wheel walker  Distance Ambulated (ft) 550 ft  Activity Response Tolerated well  Mobility Referral Yes  Mobility visit 1 Mobility  Mobility Specialist Start Time (ACUTE ONLY) 0959  Mobility Specialist Stop Time (ACUTE ONLY) 1012  Mobility Specialist Time Calculation (min) (ACUTE ONLY) 13 min   Received pt in chair and agreeable to mobility. No physical assistance needed. No c/o throughout. Returned to room and left pt in bed. Personal belongings and call light within reach. All needs met.  Lavanda Pollack Mobility Specialist  Please contact via [employer] or  Rehab Office [phone]

## 2024-02-04 ENCOUNTER — Inpatient Hospital Stay (HOSPITAL_COMMUNITY)

## 2024-02-04 DIAGNOSIS — Z515 Encounter for palliative care: Secondary | ICD-10-CM | POA: Diagnosis not present

## 2024-02-04 DIAGNOSIS — K7682 Hepatic encephalopathy: Secondary | ICD-10-CM | POA: Diagnosis not present

## 2024-02-04 LAB — RENAL FUNCTION PANEL
Albumin: 1.7 g/dL — ABNORMAL LOW (ref 3.5–5.0)
Anion gap: 9 (ref 5–15)
BUN: 5 mg/dL — ABNORMAL LOW (ref 6–20)
CO2: 24 mmol/L (ref 22–32)
Calcium: 7.8 mg/dL — ABNORMAL LOW (ref 8.9–10.3)
Chloride: 98 mmol/L (ref 98–111)
Creatinine, Ser: 1.05 mg/dL (ref 0.61–1.24)
GFR, Estimated: >60 mL/min (ref >60)
Glucose, Bld: 86 mg/dL (ref 70–99)
Phosphorus: 2.6 mg/dL (ref 2.5–4.6)
Potassium: 3.5 mmol/L (ref 3.5–5.1)
Sodium: 131 mmol/L — ABNORMAL LOW (ref 135–145)

## 2024-02-04 NOTE — Progress Notes (Addendum)
   Palliative Medicine Inpatient Follow Up Note HPI:  Patient is a 58 year old male with CAD status post stent, history of syncope, history of high risk of falls, history of heavy alcohol abuse, anemia, AKI, depression anxiety, alcoholic hepatitis, hyperlipidemia, tobacco abuse, essential hypertension, history of metabolic encephalopathy suspicious for Wernicke's encephalopathy, history of DTs, chronic hyponatremia, brought to the emergency room via EMS patient was found crawling into the neighbors yard was confused similar episode happened on the 11th where he saw snake in the neighbors yard.  Palliative care asked to support additional goals of care conversations.   Today's Discussion 02/04/2024  *Please note that this is a verbal dictation therefore any spelling or grammatical errors are due to the Dragon Medical One system interpretation.  Chart reviewed inclusive of vital signs, progress notes, laboratory results, and diagnostic images.   I met with Jeffrey Hodges and his wife, Jeffrey Hodges at bedside this morning. Kenderick is pleasant - sitting up in the recliner. Per patients RN, there have been no behavioral issues that she is aware of.   As of this morning no additional adjustments will need to be made to Depakote  given that the present dosing seems to be helping.  Jeffrey Hodges still has failure to thrive and does not endorse a desire to eat or drink despite foods being offered regularly.   Plan for transition to SNF [facility]) tomorrow if Shanna remains stable.   Questions and concerns addressed/Palliative Support Provided.   Objective Assessment: Vital Signs Vitals:   02/04/24 0600 02/04/24 0807  BP: (!) 112/56 100/74  Pulse: 98 89  Resp:  15  Temp: 98.5 F (36.9 C) 98 F (36.7 C)  SpO2: 96% 98%    Intake/Output Summary (Last 24 hours) at 02/04/2024 1048 Last data filed at 02/03/2024 2216 Gross per 24 hour  Intake 360 ml  Output --  Net 360 ml   Last Weight  Most recent update: 02/04/2024   5:52 AM    Weight  73 kg (160 lb 15 oz)            Gen: Frail middle-aged Caucasian male chronically ill in appearance HEENT: moist mucous membranes CV: Irregular rate and regular rhythm  PULM: On room air breathing is even and nonlabored ABD: soft/nontender  EXT: No edema  Neuro: Disoriented throughout conversation  SUMMARY OF RECOMMENDATIONS   DNAR/DNI, No feeding tubes (short or long term)    OP Palliative care referral made to [facility]  Continue Depakote  500mg  PO Q8H behavioral symptoms  Plan for discharge to [facility] in the oncoming day(s) if he remains stable   Palliative care will follow along as needed  ______________________________________________________________________________________ Rosaline Becton [place] Palliative Medicine Team Team Cell Phone: [phone] Please utilize secure chat with additional questions, if there is no response within 30 minutes please call the above phone number  Billing based on MDM: Moderate   Palliative Medicine Team providers are available by phone from 7am to 7pm daily and can be reached through the team cell phone.  Should this patient require assistance outside of these hours, please call the patient's attending physician.

## 2024-02-04 NOTE — Progress Notes (Signed)
 Mobility Specialist Progress Note:    02/04/24 1107  Mobility  Activity Ambulated with assistance in hallway  Level of Assistance Contact guard assist, steadying assist  Assistive Device Front wheel walker  Distance Ambulated (ft) 550 ft  Activity Response Tolerated well  Mobility Referral Yes  Mobility visit 1 Mobility  Mobility Specialist Start Time (ACUTE ONLY) 1040  Mobility Specialist Stop Time (ACUTE ONLY) 1051  Mobility Specialist Time Calculation (min) (ACUTE ONLY) 11 min   Received pt in chair and eager for mobility. No physical assistance needed. No c/o throughout. Returned to room and left pt in bed with alarm on. Personal belongings and call light within reach. All needs met. Family present.  Lavanda Pollack Mobility Specialist  Please contact via [employer] or  Rehab Office [phone]

## 2024-02-04 NOTE — Plan of Care (Signed)
   Problem: Education: Goal: Knowledge of General Education information will improve Description Including pain rating scale, medication(s)/side effects and non-pharmacologic comfort measures Outcome: Progressing   Problem: Health Behavior/Discharge Planning: Goal: Ability to manage health-related needs will improve Outcome: Progressing

## 2024-02-04 NOTE — Plan of Care (Signed)

## 2024-02-04 NOTE — Progress Notes (Signed)
 Mobility Specialist Progress Note:    02/04/24 1527  Mobility  Activity Ambulated with assistance to bathroom  Level of Assistance Contact guard assist, steadying assist  Assistive Device None  Distance Ambulated (ft) 10 ft  Activity Response Tolerated well  Mobility Referral Yes  Mobility visit 1 Mobility  Mobility Specialist Start Time (ACUTE ONLY) 1410  Mobility Specialist Stop Time (ACUTE ONLY) 1420  Mobility Specialist Time Calculation (min) (ACUTE ONLY) 10 min   Received pt's bed alarm going off. Pt was in BR on hands and knees very confused, stating I don't know where I am and I need to clean this up. Pt had two noticeable skin tears on bilat elbows. MS assisted pt off the floor and back to bed. Pt was mildly unsteady requiring contact guard. Left pt in bed w/ RN and NT in room. Call bell and personal belongings in reach. All needs met.   Lavanda Pollack Mobility Specialist  Please contact via [employer] or  Rehab Office [phone]

## 2024-02-04 NOTE — Progress Notes (Addendum)
 Mobility specialists came to tell me that they found patient on the bathroom floor trying to clean up blood on the floor. They stated that the bed alarm was going off and they entered the room and found patient on bathroom floor. They assisted him to the bed and put the bed alarm on and call bell within reach. The bed alarm was only ringing in patient's room. The NS said it was not ringing on hillrom up at the front. I entered room and found patient in bed. He was alert to self which is his baseline. He had some blood on his gown which we changed and some blood on the bathroom floor from the skin tears on his elbows. I cleansed his elbows and applied foams bilaterally.  I notified Dr. CHRISTOBAL [place] and then I called the wife. I left a voicemail but she called me back.  The doctor ordered bilateral elbow xrays and a head CT. Bed alarm on, fall mats are down (they were before the fall), fall bracelets was already on.  Before the fall, patient's family had been in the room with him. I was not aware that they had left.

## 2024-02-04 NOTE — TOC Progression Note (Signed)
 Transition of Care [facility] - Progression Note    Patient Details  Name: Jeffrey Hodges MRN: [id number] Date of Birth: [birth year]  Transition [facility]) CM/SW Contact  Damiean Lukes A [place], LCSW Phone Number: 02/04/2024, 9:52 AM  Clinical Narrative:     CSW spoke with [name] at [facility], notified of pt's DC of telesitter.   Pt must be 48hrs without sitter/restraints/etc according to SNF guidelines. Pt able to DC tomorrow if medically stable and maintains restraint protocol.   CSW contacted pt's wife [name] and notified her of update, no other questions asked.   TOC will continue to follow.   Expected Discharge Plan: Skilled Nursing Facility Barriers to Discharge: Continued Medical Work up, English as a second language teacher               Expected Discharge Plan and Services     Post Acute Care Choice: Skilled Nursing Facility Living arrangements for the past 2 months: Single Family Home                                       Social Drivers of Health (SDOH) Interventions SDOH Screenings   Food Insecurity: Patient Unable To Answer (02/02/2024)  Housing: Patient Unable To Answer (02/02/2024)  Transportation Needs: No Transportation Needs (12/26/2023)  Utilities: Not At Risk (12/26/2023)  Depression (PHQ2-9): Medium Risk (01/19/2024)  Financial Resource Strain: Medium Risk (03/24/2023)  Social Connections: Unknown (03/24/2023)  Stress: Stress Concern Present (03/24/2023)  Tobacco Use: Medium Risk (01/27/2024)    Readmission Risk Interventions     No data to display

## 2024-02-04 NOTE — Progress Notes (Signed)
 PROGRESS NOTE Jeffrey Hodges  FMW:[id number] DOB: [birth year] DOA: 01/27/2024 PCP: [name], NP  Brief Narrative/Hospital Course:  58 year old male with CAD status post stent, history of syncope, history of high risk of falls, history of heavy alcohol abuse, anemia, AKI, depression anxiety, alcoholic hepatitis, hyperlipidemia, tobacco abuse, essential hypertension, history of metabolic encephalopathy suspicious for Wernicke's encephalopathy, history of DTs, chronic hyponatremia, brought to the emergency room via EMS patient was found crawling into the neighbors yard was confused similar episode happened on the 11th where he saw snake in the neighbors yard.  In ED, labs showed sodium 129, potassium 2.7, creatinine 1.1, AST 127, ALT 41, ammonia level 58, total bilirubin 2.4, CBC showed WBCs 5, AFP 12.9 on 30 October 2023 TeleSitter removed 7/22, awaiting SNF  Subjective: Seen and examined this morning with multiple family including stepmother at bedside Overnight afebrile BP stable on room air Labs reviewed stable BMP albumin  low 1.7 Last CBC 7/22 with hemoglobin 10.5 Alert awake confused but communicative interactive follows commands appropriately  Assessment and plan:  Acute metabolic encephalopathy, likely hepatic encephalopathy Suspected Warnicke's encephalopathy Transaminitis Hypoalbuminemia Alcoholic cirrhosis: Per family, patient's mental status has been worsening since her previous admission.  He has been confused, forgetful, wandering, low enforcement has been called multiple times by the neighbors.  Ammonia level 58 on admission, received lactulose , thiamine  500 mg x 1 UDS positive for benzodiazepines, THC Continue supportive care, lactulose  20 g twice daily, titrate to 2-3 BMs in a day and rifaximin , started by GI Patient CT abdomen including MRI liver- no worrisome focal hepatic lesion, finding on the CT scan compatible with benign hemangioma.  Had paracentesis on 7/16 with 7 L  of ascitic fluid removed, AFP 12.9 repeat paracentesis on 7/25 with 4.7 L Continue on Lasix  60 daily Aldactone  50   History of alcohol abuse with concern for Warnicke's encephalopathy Patient drinks beer and hard liquor daily Patient was manage CIWA protocol, received thiamine  high-dose, seen by neurology no significant improvement, managed with s/p Librium  TAPER  overall mental status has remained stable although confused, decreased oral Depakote  trial.   Is off sitter . Palliative care following  Hypokalemia Replaced   Hyponatremia Likely due to hypervolemia, ascites.  Monitor  Decline/debility: Continue PT OT PT recommended SNF Palliative medicine consulted for GOC, DNR DNI, no artificial feeding tube.   DVT prophylaxis: heparin  injection 5,000 Units Start: 01/27/24 1400 Code Status:   Code Status: Limited: Do not attempt resuscitation (DNR) -DNR-LIMITED -Do Not Intubate/DNI  Family Communication: plan of care discussed with patient/damily at bedside. Patient status is: Remains hospitalized because of severity of illness Level of care: Telemetry Medical   Dispo: The patient is from: home            Anticipated disposition: SNF tomorrow Objective: Vitals last 24 hrs: Vitals:   02/04/24 0215 02/04/24 0500 02/04/24 0600 02/04/24 0807  BP: (!) 112/54  (!) 112/56 100/74  Pulse: 98  98 89  Resp:    15  Temp:   98.5 F (36.9 C) 98 F (36.7 C)  TempSrc:   Oral   SpO2:   96% 98%  Weight:  73 kg    Height:        Physical Examination: General exam: alert awake, older than stated age HEENT:Oral mucosa moist, Ear/Nose WNL grossly Respiratory system: Bilaterally clear BS, no use of accessory muscle Cardiovascular system: S1 & S2 +. Gastrointestinal system: Abdomen soft, NT,ND,BS+ Nervous System: Alert, awake, following commands. Extremities: LE edema neg,  warm extremities Skin: No rashes,warm. MSK: Normal muscle bulk/tone.   Data Reviewed: I have personally reviewed  following labs and imaging studies ( see epic result tab) CBC: Recent Labs  Lab 01/30/24 0500 01/31/24 0447 02/01/24 0724 02/02/24 1355 02/03/24 0635  WBC 10.0 9.2 8.2 6.6 6.4  HGB 11.2* 10.9* 11.2* 11.2* 10.5*  HCT 31.7* 30.1* 31.1* 31.2* 29.0*  MCV 89.3 88.8 89.1 90.4 89.0  PLT 246 225 216 206 199   CMP: Recent Labs  Lab 01/29/24 0615 01/30/24 0500 01/31/24 0447 02/01/24 0724 02/02/24 1355 02/02/24 1357 02/03/24 0635 02/04/24 0519  NA 131* 132* 131* 131*  --  133* 129* 131*  K 3.9 4.3 3.7 3.6  --  3.7 3.1* 3.5  CL 99 99 100 99  --  101 98 98  CO2 22 24 24 22   --  21* 24 24  GLUCOSE 111* 103* 97 87  --  97 80 86  BUN 7 7 8 11   --  8 7 5*  CREATININE 1.00 1.10 1.03 1.09  --  1.00 0.81 1.05  CALCIUM  7.7* 7.9* 7.7* 7.9*  --  7.8* 7.8* 7.8*  MG 1.7 1.6* 1.6*  --  2.1  --   --   --   PHOS 2.8 2.8 3.6  --  3.4 3.3 2.8 2.6   GFR: Estimated Creatinine Clearance: 82.1 mL/min (by C-G formula based on SCr of 1.05 mg/dL). Recent Labs  Lab 01/29/24 0615 01/30/24 0500 01/31/24 0447 02/01/24 0724 02/02/24 1357 02/03/24 0635 02/04/24 0519  AST 64* 58* 56* 65*  --   --   --   ALT 32 31 30 35  --   --   --   ALKPHOS 135* 133* 135* 148*  --   --   --   BILITOT 1.4* 1.3* 1.3* 1.0  --   --   --   PROT 6.0* 6.0* 5.8* 6.2*  --   --   --   ALBUMIN  <1.5* <1.5* <1.5* <1.5* 1.8* 1.9* 1.7*   No results for input(s): LIPASE, AMYLASE in the last 168 hours.  Recent Labs  Lab 02/01/24 0724  AMMONIA 49*   Coagulation Profile:  Recent Labs  Lab 01/29/24 1430  INR 1.3*   Unresulted Labs (From admission, onward)    None      Antimicrobials/Microbiology: Anti-infectives (From admission, onward)    Start     Dose/Rate Route Frequency Ordered Stop   01/29/24 2200  rifaximin  (XIFAXAN ) tablet 550 mg        550 mg Oral 2 times daily 01/29/24 1700           Component Value Date/Time   SDES PERITONEAL 01/28/2024 1559   SDES PERITONEAL 01/28/2024 1559   SPECREQUEST NONE  01/28/2024 1559   SPECREQUEST NONE 01/28/2024 1559   CULT  01/28/2024 1559    NO GROWTH 5 DAYS Performed at [facility], [address], [place], [place] [address]    REPTSTATUS 01/28/2024 FINAL 01/28/2024 1559   REPTSTATUS 02/02/2024 FINAL 01/28/2024 1559  Medications reviewed: Scheduled Meds:  atorvastatin   80 mg Oral Daily   busPIRone   10 mg Oral BID   chlordiazePOXIDE   25 mg Oral Daily   cyanocobalamin   1,000 mcg Oral Daily   divalproex   500 mg Oral Q8H   FLUoxetine   40 mg Oral Daily   folic acid   1 mg Oral Daily   furosemide   60 mg Oral Daily   heparin   5,000 Units Subcutaneous Q8H  lactulose   30 g Oral Daily   multivitamin with minerals  1 tablet Oral Daily   nicotine   21 mg Transdermal Daily   pantoprazole   40 mg Oral BID   rifaximin   550 mg Oral BID   sodium chloride  flush  3 mL Intravenous Q12H   sodium chloride  flush  3-10 mL Intravenous Q12H   spironolactone   50 mg Oral Daily   tamsulosin   0.4 mg Oral Daily   thiamine   100 mg Oral Daily   Continuous Infusions:  Mennie LAMY, MD [facility] 02/04/2024, 11:25 AM

## 2024-02-04 NOTE — Progress Notes (Signed)
 Physical Therapy Treatment Patient Details Name: Jeffrey Hodges MRN: [id number] DOB: 05-Jan-1966 Today's Date: 02/04/2024   History of Present Illness 58 y.o. male admitted 7/15 with acute metabolic encephalopathy, likely hepatic encephalopathy. Also suspected Warnicke's encephalopathy. PMH: heart disease status post stent, heavy alcohol abuse, anemia, AKI, depression anxiety, elevated LFTs, hyperlipidemia, tobacco abuse, essential hypertension.    PT Comments  Pt resting in bed on arrival, pleasantly and agreeable to session. Pt continues to be limited in safe mobility by poor balance/postural reactions and cognitive deficits with poor insight into safety and current deficits. Pt able to demonstrate gait with RW for support with grossly CGA to min A, pt with poor ability for cognitive dual tasking during gait, only able to hold attention for short amount of time (1-2 answers) and needing to slow or stop gait to complete (naming brown animals, naming vegetables). Pt needing max cues to locate room on return. Pt remains at high risk for falls and will benefit from continued inpatient follow up therapy, <3 hours/day. Pt continues to benefit from skilled PT services to progress toward functional mobility goals.      If plan is discharge home, recommend the following: Assistance with cooking/housework;Direct supervision/assist for medications management;Direct supervision/assist for financial management;Help with stairs or ramp for entrance;Assist for transportation;Supervision due to cognitive status;A little help with walking and/or transfers;A little help with bathing/dressing/bathroom   Can travel by private vehicle     Yes  Equipment Recommendations  Other (comment)    Recommendations for Other Services       Precautions / Restrictions Precautions Precautions: Fall Recall of Precautions/Restrictions: Impaired Restrictions Weight Bearing Restrictions Per Provider Order: No     Mobility   Bed Mobility Overal bed mobility: Needs Assistance Bed Mobility: Supine to Sit     Supine to sit: Supervision, HOB elevated Sit to supine: Supervision, HOB elevated   General bed mobility comments: Pt required no physical assist to complete transition to sitting on EOB. VC provided to initiate task only    Transfers Overall transfer level: Needs assistance Equipment used: Rolling walker (2 wheels) Transfers: Sit to/from Stand, Bed to chair/wheelchair/BSC Sit to Stand: Contact guard assist           General transfer comment: CGA to steady on rise    Ambulation/Gait Ambulation/Gait assistance: Min assist, Contact guard assist Gait Distance (Feet): 250 Feet (x2) Assistive device: Rolling walker (2 wheels) Gait Pattern/deviations: Step-through pattern, Decreased stride length, Drifts right/left, Wide base of support Gait velocity: decr     General Gait Details: Minor instability with RW for support. fair pace with cues for safety and awareness. A little erratic with RW but without overt LOB, focus on dual tasking with pt slowing gait speed or diverting off topic   Stairs             Wheelchair Mobility     Tilt Bed    Modified Rankin (Stroke Patients Only)       Balance Overall balance assessment: Needs assistance Sitting-balance support: No upper extremity supported, Feet supported Sitting balance-Leahy Scale: Good Sitting balance - Comments: able to straighten socks on feet while seated EOB.   Standing balance support: Bilateral upper extremity supported, During functional activity, Reliant on assistive device for balance Standing balance-Leahy Scale: Fair Standing balance comment: reliant on RW for support today                            Communication Communication  Communication: No apparent difficulties  Cognition Arousal: Alert Behavior During Therapy: Flat affect, Impulsive   PT - Cognitive impairments: Awareness, Memory, Attention,  Initiation, Sequencing, Problem solving, Safety/Judgement, Orientation                       PT - Cognition Comments: pt needing increased cues for safety Following commands: Impaired Following commands impaired: Follows one step commands inconsistently    Cueing Cueing Techniques: Verbal cues, Tactile cues  Exercises      General Comments        Pertinent Vitals/Pain Pain Assessment Pain Assessment: No/denies pain Pain Intervention(s): Monitored during session    Home Living                          Prior Function            PT Goals (current goals can now be found in the care plan section) Acute Rehab PT Goals PT Goal Formulation: Patient unable to participate in goal setting Time For Goal Achievement: 02/12/24 Progress towards PT goals: Progressing toward goals    Frequency    Min 2X/week      PT Plan      Co-evaluation              AM-PAC PT 6 Clicks Mobility   Outcome Measure  Help needed turning from your back to your side while in a flat bed without using bedrails?: None Help needed moving from lying on your back to sitting on the side of a flat bed without using bedrails?: A Little Help needed moving to and from a bed to a chair (including a wheelchair)?: A Little Help needed standing up from a chair using your arms (e.g., wheelchair or bedside chair)?: A Little Help needed to walk in hospital room?: A Little Help needed climbing 3-5 steps with a railing? : A Lot 6 Click Score: 18    End of Session Equipment Utilized During Treatment: Gait belt Activity Tolerance: Patient tolerated treatment well Patient left: in bed;with call bell/phone within reach;with bed alarm set;Other (comment) (with fall mats placed) Nurse Communication: Mobility status;Other (comment) (IV out on arrival) PT Visit Diagnosis: Unsteadiness on feet (R26.81);Other abnormalities of gait and mobility (R26.89);Other symptoms and signs involving the  nervous system (R29.898);History of falling (Z91.81);Muscle weakness (generalized) (M62.81);Repeated falls (R29.6)     Time: 8447-8387 PT Time Calculation (min) (ACUTE ONLY): 20 min  Charges:    $Gait Training: 8-22 mins PT General Charges $$ ACUTE PT VISIT: 1 Visit                     Corynne Scibilia R. PTA Acute Rehabilitation Services Office: [phone]   Therisa CHRISTELLA Boor 02/04/2024, 4:25 PM

## 2024-02-04 NOTE — Hospital Course (Addendum)
 58 year old male with CAD status post stent, history of syncope, history of high risk of falls, history of heavy alcohol abuse, anemia, AKI, depression anxiety, alcoholic hepatitis, hyperlipidemia, tobacco abuse, essential hypertension, history of metabolic encephalopathy suspicious for Wernicke's encephalopathy, history of DTs, chronic hyponatremia, brought to the emergency room via EMS patient was found crawling into the neighbors yard was confused similar episode happened on the 11th where he saw snake in the neighbors yard.  In ED, labs showed sodium 129, potassium 2.7, creatinine 1.1, AST 127, ALT 41, ammonia level 58, total bilirubin 2.4, CBC showed WBCs 5, AFP 12.9 on 30 October 2023 TeleSitter removed 7/22, awaiting SNF  Subjective: Seen and examined this morning Alert awake communicative pleasantly confused Overnight no acute events  Discharge diagnosis   Acute metabolic encephalopathy, likely hepatic encephalopathy Suspected Warnicke's encephalopathy Transaminitis Hypoalbuminemia Alcoholic cirrhosis: Per family, patient's mental status has been worsening since her previous admission.  He has been confused, forgetful, wandering, low enforcement has been called multiple times by the neighbors.  Ammonia level 58 on admission, received lactulose , thiamine  500 mg x 1 UDS positive for benzodiazepines, THC Continue supportive care, lactulose  20 g twice daily, titrate to 2-3 BMs in a day and rifaximin , started by GI Patient CT abdomen including MRI liver- no worrisome focal hepatic lesion, finding on the CT scan compatible with benign hemangioma.  Had paracentesis on 7/16 with 7 L of ascitic fluid removed, AFP 12.9 repeat paracentesis on 7/25 with 4.7 L Continue on Lasix  60 daily Aldactone  50   History of alcohol abuse with concern for Warnicke's encephalopathy Patient drinks beer and hard liquor daily. Patient was managed w/ CIWA protocol, received thiamine  high-dose, seen by neurology no  significant improvement, managed with s/p Librium  TAPER  overall mental status has remained stable although confused, decreased oral Depakote  trial.Is off sitter . Palliative care following  Possible fall 7/23: He says hit his elbow while he was looking up In the ceiling in bathroom,no LOC. Elbow skin tear x-ray unremarkable CT head no acute finding, no complaint of pain and no change Continue fall precautions.advised top to inform RN/staffs before walking  Hypokalemia Replaced   Hyponatremia Likely due to hypervolemia, ascites.  Monitor  Decline/debility: Continue PT OT PT recommended SNF Palliative medicine consulted for GOC, DNR DNI, no artificial feeding tube.

## 2024-02-05 DIAGNOSIS — E785 Hyperlipidemia, unspecified: Secondary | ICD-10-CM | POA: Diagnosis not present

## 2024-02-05 DIAGNOSIS — F321 Major depressive disorder, single episode, moderate: Secondary | ICD-10-CM | POA: Diagnosis not present

## 2024-02-05 DIAGNOSIS — Z515 Encounter for palliative care: Secondary | ICD-10-CM | POA: Diagnosis not present

## 2024-02-05 DIAGNOSIS — R4182 Altered mental status, unspecified: Secondary | ICD-10-CM | POA: Diagnosis not present

## 2024-02-05 DIAGNOSIS — F339 Major depressive disorder, recurrent, unspecified: Secondary | ICD-10-CM | POA: Diagnosis not present

## 2024-02-05 DIAGNOSIS — I10 Essential (primary) hypertension: Secondary | ICD-10-CM | POA: Diagnosis not present

## 2024-02-05 DIAGNOSIS — F411 Generalized anxiety disorder: Secondary | ICD-10-CM | POA: Diagnosis not present

## 2024-02-05 DIAGNOSIS — F331 Major depressive disorder, recurrent, moderate: Secondary | ICD-10-CM | POA: Diagnosis not present

## 2024-02-05 DIAGNOSIS — K7682 Hepatic encephalopathy: Secondary | ICD-10-CM | POA: Diagnosis not present

## 2024-02-05 DIAGNOSIS — M6281 Muscle weakness (generalized): Secondary | ICD-10-CM | POA: Diagnosis not present

## 2024-02-05 DIAGNOSIS — I214 Non-ST elevation (NSTEMI) myocardial infarction: Secondary | ICD-10-CM | POA: Diagnosis not present

## 2024-02-05 DIAGNOSIS — R2689 Other abnormalities of gait and mobility: Secondary | ICD-10-CM | POA: Diagnosis not present

## 2024-02-05 DIAGNOSIS — Z7401 Bed confinement status: Secondary | ICD-10-CM | POA: Diagnosis not present

## 2024-02-05 DIAGNOSIS — R278 Other lack of coordination: Secondary | ICD-10-CM | POA: Diagnosis not present

## 2024-02-05 MED ORDER — RIFAXIMIN 550 MG PO TABS: 550.0000 mg | ORAL_TABLET | Freq: Two times a day (BID) | ORAL | Status: AC

## 2024-02-05 MED ORDER — ADULT MULTIVITAMIN W/MINERALS CH
1.0000 | ORAL_TABLET | Freq: Every day | ORAL | Status: AC
Start: 2024-02-05 — End: ?

## 2024-02-05 MED ORDER — FUROSEMIDE 20 MG PO TABS: 60.0000 mg | ORAL_TABLET | Freq: Every day | ORAL | Status: DC

## 2024-02-05 MED ORDER — LACTULOSE 10 GM/15ML PO SOLN: 30.0000 g | Freq: Every day | ORAL | Status: AC

## 2024-02-05 MED ORDER — SPIRONOLACTONE 50 MG PO TABS: 50.0000 mg | ORAL_TABLET | Freq: Every day | ORAL | Status: DC

## 2024-02-05 MED ORDER — DIVALPROEX SODIUM 500 MG PO DR TAB: 500.0000 mg | DELAYED_RELEASE_TABLET | Freq: Three times a day (TID) | ORAL | Status: AC

## 2024-02-05 NOTE — Progress Notes (Signed)
 AVS completed for discharge packet and placed with chart.

## 2024-02-05 NOTE — Plan of Care (Signed)

## 2024-02-05 NOTE — Plan of Care (Signed)

## 2024-02-05 NOTE — Progress Notes (Signed)
 Mobility Specialist Progress Note:    02/05/24 1113  Mobility  Activity Ambulated with assistance in hallway  Level of Assistance Contact guard assist, steadying assist  Assistive Device Front wheel walker  Distance Ambulated (ft) 550 ft  Activity Response Tolerated well  Mobility Referral Yes  Mobility visit 1 Mobility  Mobility Specialist Start Time (ACUTE ONLY) 1008  Mobility Specialist Stop Time (ACUTE ONLY) 1021  Mobility Specialist Time Calculation (min) (ACUTE ONLY) 13 min   Received pt EOB and agreeable to mobility. No physical assistance required. No c/o. Returned to room without fault. Pt left in chair with alarm on. Personal belongings and call light within reach. All needs met. Family in room.  Lavanda Pollack Mobility Specialist  Please contact via [employer] or  Rehab Office [phone]

## 2024-02-05 NOTE — Discharge Summary (Signed)
 Physician Discharge Summary  Jeffrey Hodges FMW:[id number] DOB: May 17, 1966 DOA: 01/27/2024  PCP: [name], NP  Admit date: 01/27/2024 Discharge date: 02/05/2024 Recommendations for Outpatient Follow-up:  Follow up with PCP in 1 weeks-call for appointment Please obtain BMP/CBC in one week  Discharge Dispo: SNF Discharge Condition: Stable Code Status:   Code Status: Limited: Do not attempt resuscitation (DNR) -DNR-LIMITED -Do Not Intubate/DNI  Diet recommendation:  Diet Order             DIET DYS 3 Room service appropriate? Yes with Assist; Fluid consistency: Nectar Thick  Diet effective now                    Brief/Interim Summary:  58 year old male with CAD status post stent, history of syncope, history of high risk of falls, history of heavy alcohol abuse, anemia, AKI, depression anxiety, alcoholic hepatitis, hyperlipidemia, tobacco abuse, essential hypertension, history of metabolic encephalopathy suspicious for Wernicke's encephalopathy, history of DTs, chronic hyponatremia, brought to the emergency room via EMS patient was found crawling into the neighbors yard was confused similar episode happened on the 11th where he saw snake in the neighbors yard.  In ED, labs showed sodium 129, potassium 2.7, creatinine 1.1, AST 127, ALT 41, ammonia level 58, total bilirubin 2.4, CBC showed WBCs 5, AFP 12.9 on 30 October 2023 TeleSitter removed 7/22, awaiting SNF  Subjective: Seen and examined this morning Alert awake communicative pleasantly confused Overnight no acute events  Discharge diagnosis   Acute metabolic encephalopathy, likely hepatic encephalopathy Suspected Warnicke's encephalopathy Transaminitis Hypoalbuminemia Alcoholic cirrhosis: Per family, patient's mental status has been worsening since her previous admission.  He has been confused, forgetful, wandering, low enforcement has been called multiple times by the neighbors.  Ammonia level 58 on admission, received  lactulose , thiamine  500 mg x 1 UDS positive for benzodiazepines, THC Continue supportive care, lactulose  20 g twice daily, titrate to 2-3 BMs in a day and rifaximin , started by GI Patient CT abdomen including MRI liver- no worrisome focal hepatic lesion, finding on the CT scan compatible with benign hemangioma.  Had paracentesis on 7/16 with 7 L of ascitic fluid removed, AFP 12.9 repeat paracentesis on 7/25 with 4.7 L Continue on Lasix  60 daily Aldactone  50   History of alcohol abuse with concern for Warnicke's encephalopathy Patient drinks beer and hard liquor daily. Patient was managed w/ CIWA protocol, received thiamine  high-dose, seen by neurology no significant improvement, managed with s/p Librium  TAPER  overall mental status has remained stable although confused, decreased oral Depakote  trial.Is off sitter . Palliative care following  Possible fall 7/23: He says hit his elbow while he was looking up In the ceiling in bathroom,no LOC. Elbow skin tear x-ray unremarkable CT head no acute finding, no complaint of pain and no change Continue fall precautions.advised top to inform RN/staffs before walking  Hypokalemia Replaced   Hyponatremia Likely due to hypervolemia, ascites.  Monitor  Decline/debility: Continue PT OT PT recommended SNF Palliative medicine consulted for GOC, DNR DNI, no artificial feeding tube.   Discharge Exam: Vitals:   02/05/24 0352 02/05/24 0843  BP: (!) 111/57 117/86  Pulse: 89 (!) 101  Resp: 16 14  Temp: 98.7 F (37.1 C) 97.9 F (36.6 C)  SpO2: 94% (!) 85%   General: Pt is alert, awake, not in acute distress Cardiovascular: RRR, S1/S2 +, no rubs, no gallops Respiratory: CTA bilaterally, no wheezing, no rhonchi Abdominal: Soft, NT, ND, bowel sounds + Extremities: no edema, no cyanosis  Discharge Instructions  Discharge Instructions     Discharge instructions   Complete by: As directed    Please call call MD or return to ER for similar or  worsening recurring problem that brought you to hospital or if any fever,nausea/vomiting,abdominal pain, uncontrolled pain, chest pain,  shortness of breath or any other alarming symptoms.  Please follow-up your doctor as instructed in a week time and call the office for appointment.  Please avoid alcohol, smoking, or any other illicit substance and maintain healthy habits including taking your regular medications as prescribed.  You were cared for by a hospitalist during your hospital stay. If you have any questions about your discharge medications or the care you received while you were in the hospital after you are discharged, you can call the unit and ask to speak with the hospitalist on call if the hospitalist that took care of you is not available.  Once you are discharged, your primary care physician will handle any further medical issues. Please note that NO REFILLS for any discharge medications will be authorized once you are discharged, as it is imperative that you return to your primary care physician (or establish a relationship with a primary care physician if you do not have one) for your aftercare needs so that they can reassess your need for medications and monitor your lab values   Discharge wound care:   Complete by: As directed    Regular dry dressing and daily change   Increase activity slowly   Complete by: As directed       Allergies as of 02/05/2024   No Known Allergies      Medication List     STOP taking these medications    naltrexone  50 MG tablet Commonly known as: DEPADE       TAKE these medications    aspirin  EC 81 MG tablet Take 1 tablet (81 mg total) by mouth daily. Swallow whole. What changed: when to take this   atorvastatin  80 MG tablet Commonly known as: LIPITOR Take 1 tablet (80 mg total) by mouth daily.   busPIRone  10 MG tablet Commonly known as: BUSPAR  Take 1 tablet (10 mg total) by mouth 2 (two) times daily.   cyanocobalamin  1000  MCG tablet Commonly known as: VITAMIN B12 Take 1 tablet (1,000 mcg total) by mouth daily.   divalproex  500 MG DR tablet Commonly known as: DEPAKOTE  Take 1 tablet (500 mg total) by mouth every 8 (eight) hours.   FLUoxetine  40 MG capsule Commonly known as: PROZAC  Take 1 capsule (40 mg total) by mouth daily.   folic acid  1 MG tablet Commonly known as: FOLVITE  Take 1 tablet (1 mg total) by mouth daily.   furosemide  20 MG tablet Commonly known as: LASIX  Take 3 tablets (60 mg total) by mouth daily. What changed:  medication strength how much to take when to take this   lactulose  10 GM/15ML solution Commonly known as: CHRONULAC  Take 45 mLs (30 g total) by mouth daily. What changed:  how much to take when to take this   multivitamin with minerals Tabs tablet Take 1 tablet by mouth daily.   nicotine  21 mg/24hr patch Commonly known as: NICODERM CQ  - dosed in mg/24 hours Place 1 patch (21 mg total) onto the skin daily.   nitroGLYCERIN  0.4 MG SL tablet Commonly known as: NITROSTAT  Place 1 tablet (0.4 mg total) under the tongue every 5 (five) minutes x 3 doses as needed for chest pain.   pantoprazole  40  MG tablet Commonly known as: PROTONIX  Take 1 tablet (40 mg total) by mouth daily.   potassium chloride  10 MEQ tablet Commonly known as: KLOR-CON  Take 1 tablet (10 mEq total) by mouth daily.   rifaximin  550 MG Tabs tablet Commonly known as: XIFAXAN  Take 1 tablet (550 mg total) by mouth 2 (two) times daily.   spironolactone  50 MG tablet Commonly known as: ALDACTONE  Take 1 tablet (50 mg total) by mouth daily. What changed:  medication strength how much to take   tamsulosin  0.4 MG Caps capsule Commonly known as: FLOMAX  Take 1 capsule (0.4 mg total) by mouth daily.   thiamine  100 MG tablet Commonly known as: VITAMIN B1 Take 1 tablet (100 mg total) by mouth daily.   Vitamin D  (Ergocalciferol ) 1.25 MG (50000 UNIT) Caps capsule Commonly known as: DRISDOL  X 24  weeks What changed:  how much to take how to take this when to take this additional instructions               Discharge Care Instructions  (From admission, onward)           Start     Ordered   02/05/24 0000  Discharge wound care:       Comments: Regular dry dressing and daily change   02/05/24 9060            Contact information for follow-up providers     [name], NP Follow up.   Specialties: Nurse Practitioner, Family Medicine Contact information: [address] [place] [address] [phone]              Contact information for after-discharge care     Destination     [facility] .   Service: Skilled Nursing Contact information: [address] [place]  [address] [phone]                    No Known Allergies  The results of significant diagnostics from this hospitalization (including imaging, microbiology, ancillary and laboratory) are listed below for reference.    Microbiology: Recent Results (from the past 240 hours)  Gram stain     Status: None   Collection Time: 01/28/24  3:59 PM   Specimen: Abdomen; Peritoneal Fluid  Result Value Ref Range Status   Specimen Description PERITONEAL  Final   Special Requests NONE  Final   Gram Stain   Final    NO WBC SEEN NO ORGANISMS SEEN Performed at [facility], [address] [place], [place] [address]    Report Status 01/28/2024 FINAL  Final  Culture, body fluid w Gram Stain-bottle     Status: None   Collection Time: 01/28/24  3:59 PM   Specimen: Peritoneal Washings  Result Value Ref Range Status   Specimen Description PERITONEAL  Final   Special Requests NONE  Final   Culture   Final    NO GROWTH 5 DAYS Performed at [facility], [address] [place], [place] [address]    Report Status 02/02/2024 FINAL  Final    Procedures/Studies: CT HEAD WO CONTRAST ( ) Result Date: 02/04/2024 CLINICAL DATA:  Provided history:  Head trauma, abnormal mental status (Age 58-64y) EXAM: CT HEAD WITHOUT CONTRAST TECHNIQUE: Contiguous axial images were obtained from the base of the skull through the vertex without intravenous contrast. RADIATION DOSE REDUCTION: This exam was performed according to the departmental dose-optimization program which includes automated exposure control, adjustment of the mA and/or kV  according to patient size and/or use of iterative reconstruction technique. COMPARISON:  Head CT 01/23/2024 FINDINGS: Brain: No intracranial hemorrhage, mass effect, or midline shift. No hydrocephalus. The basilar cisterns are patent. Stable chronic small vessel ischemia. No evidence of territorial infarct or acute ischemia. No extra-axial or intracranial fluid collection. Vascular: No hyperdense vessel or unexpected calcification. Skull: No fracture or focal lesion. Sinuses/Orbits: No fracture or focal lesion. Other: Paranasal sinuses and mastoid air cells are clear. The visualized orbits are unremarkable. IMPRESSION: 1. No acute intracranial abnormality. No skull fracture. 2. Stable chronic small vessel ischemia. Electronically Signed   By: Andrea Gasman M.D.   On: 02/04/2024 15:44   DG Elbow 2 Views Right Result Date: 02/04/2024 CLINICAL DATA:  Fall confusion EXAM: RIGHT ELBOW - 2 VIEW COMPARISON:  None Available. FINDINGS: No definitive fracture lucency is seen. No malalignment. Positive for elbow effusion IMPRESSION: No definitive fracture lucency is seen however elbow effusion is present and occult fracture not excluded. Electronically Signed   By: Luke Bun M.D.   On: 02/04/2024 14:59   DG Elbow 2 Views Left Result Date: 02/04/2024 CLINICAL DATA:  Fall confusion EXAM: LEFT ELBOW - 2 VIEW COMPARISON:  None Available. FINDINGS: There is no evidence of fracture, dislocation, or joint effusion. There is no evidence of arthropathy or other focal bone abnormality. Soft tissues are unremarkable. IMPRESSION: Negative.  Electronically Signed   By: Luke Bun M.D.   On: 02/04/2024 14:57   IR Paracentesis Result Date: 02/02/2024 INDICATION: 58 year old male. History of alcohol use and hepatic encephalopathy. Request is for therapeutic paracentesis EXAM: ULTRASOUND GUIDED THERAPEUTIC RIGHT-SIDED PARACENTESIS MEDICATIONS: Lidocaine  1% 10 mL COMPLICATIONS: None immediate. PROCEDURE: Informed written consent was obtained from the patient after a discussion of the risks, benefits and alternatives to treatment. A timeout was performed prior to the initiation of the procedure. Initial ultrasound scanning demonstrates a large amount of ascites within the right lower abdominal quadrant. The right lower abdomen was prepped and draped in the usual sterile fashion. 1% lidocaine  was used for local anesthesia. Following this, a 19 gauge, 7-cm, Yueh catheter was introduced. An ultrasound image was saved for documentation purposes. The paracentesis was performed. The catheter was removed and a dressing was applied. The patient tolerated the procedure well without immediate post procedural complication. Patient received post-procedure intravenous albumin ; see nursing notes for details. FINDINGS: A total of approximately 4.7 L of straw-colored fluid was removed. IMPRESSION: Successful ultrasound-guided therapeutic left-sided paracentesis yielding 4.7 liters of peritoneal fluid. Performed by Delon Beagle NP Electronically Signed   By: Wilkie Lent M.D.   On: 02/02/2024 13:07   MR LIVER W WO CONTRAST Addendum Date: 01/29/2024 ADDENDUM REPORT: 01/29/2024 15:26 ADDENDUM: The original report was by Dr. Ryan Salvage. The following addendum is by Dr. Ryan Salvage: With respect to the liver, I do not see specific indicators of cirrhosis, although there was some subtle nodularity on the CT examination which could indicate cirrhosis. The uphill paraesophageal varices indicative of portal venous hypertension would also be supportive  of potential cirrhosis. One non invasive option for further characterization of the potential degree of fibrosis might be hepatic sonography with elastography. Electronically Signed   By: Ryan Salvage M.D.   On: 01/29/2024 15:26   Result Date: 01/29/2024 CLINICAL DATA:  Liver lesion.  History of alcoholic hepatitis. EXAM: MRI ABDOMEN WITHOUT AND WITH CONTRAST TECHNIQUE: Multiplanar multisequence MR imaging of the abdomen was performed both before and after the administration of intravenous contrast. CONTRAST:  10mL GADAVIST   GADOBUTROL  1 MMOL/ML IV SOLN COMPARISON:  CT abdomen 01/19/2024 FINDINGS: Despite efforts by the technologist and patient, motion artifact is present on today's exam and could not be eliminated. This reduces exam sensitivity and specificity. Lower chest: Bandlike atelectasis in both lower lobes. Uphill varices adjacent to the distal esophagus noted compatible with portal venous hypertension. Hepatobiliary: Contracted gallbladder demonstrating gallbladder wall thickening. A homogeneously T2 hyperintense lesion in the dome of the right hepatic lobe corresponding to the finding on CT scan measures 2.0 by 1.3 by 1.9 cm, and demonstrates progressive peripheral nodular centripetal enhancement most compatible with a benign hemangioma. This lesion has no early arterial phase enhancement. No other or worrisome focal hepatic lesion is identified. No biliary dilatation. Pancreas:  Unremarkable Spleen:  Unremarkable Adrenals/Urinary Tract:  Unremarkable Stomach/Bowel: Dilated loops of left abdominal small bowel up to 4.0 cm in diameter on image 16 series 3. Vascular/Lymphatic: Narrow proximal celiac trunk, narrowed proximal celiac trunk possibly related to median arcuate ligament. Questionable atheromatous narrowing of the proximal SMA although this was less striking on the recent CT scan. Other: Ascites. Mild patchy fluid infiltration of the upper omentum. Musculoskeletal: Unremarkable IMPRESSION: 1.  The lesion in the dome of the right hepatic lobe corresponding to the finding on CT scan is most compatible with a benign hemangioma. No other or worrisome focal hepatic lesion is identified. 2. Ascites. 3. Dilated loops of left abdominal small bowel up to 4.0 cm in diameter, possibly ileus or partial small bowel obstruction. 4. Upstream narrowing of the celiac trunk possibly related to median arcuate ligament. Questionable atheromatous narrowing of the proximal SMA although this was less striking on the recent CT scan. 5. Bandlike atelectasis in both lower lobes. Electronically Signed: By: Ryan Salvage M.D. On: 01/29/2024 11:35   IR Paracentesis Result Date: 01/28/2024 INDICATION: 58 year old male with hepatic encephalopathy and recurrent ascites for diagnostic and therapeutic paracentesis. EXAM: ULTRASOUND GUIDED RIGHT PARACENTESIS MEDICATIONS: 1% lidocaine  10 mL COMPLICATIONS: None immediate. PROCEDURE: Informed written consent was obtained from the patient after a discussion of the risks, benefits and alternatives to treatment. A timeout was performed prior to the initiation of the procedure. Initial ultrasound scanning demonstrates a large amount of ascites within the right lower abdominal quadrant. The right lower abdomen was prepped and draped in the usual sterile fashion. 1% lidocaine  was used for local anesthesia. Following this, a 19 gauge, 7-cm, Yueh catheter was introduced. An ultrasound image was saved for documentation purposes. The paracentesis was performed. The catheter was removed and a dressing was applied. The patient tolerated the procedure well without immediate post procedural complication. Patient received post-procedure intravenous albumin ; see nursing notes for details. FINDINGS: A total of approximately 7 L of clear, yellow fluid was removed. Samples were sent to the laboratory as requested by the clinical team. IMPRESSION: Successful ultrasound-guided paracentesis yielding 7  liters of peritoneal fluid. PLAN: Performed By Lavanda Jurist, PA-C Electronically Signed   By: Juliene Balder M.D.   On: 01/28/2024 16:39   DG Chest Portable 1 View Result Date: 01/27/2024 CLINICAL DATA:  Confusion EXAM: PORTABLE CHEST 1 VIEW COMPARISON:  X-ray 01/02/2024 FINDINGS: Underinflation. There is some linear opacity at the right lung base likely scar or atelectasis. No consolidation, pneumothorax or effusion. No edema. Overlapping cardiac leads. Normal cardiopericardial silhouette. IMPRESSION: Underinflation.  Right basilar scar or atelectasis. Electronically Signed   By: Ranell Bring M.D.   On: 01/27/2024 09:57   CT Head Wo Contrast Result Date: 01/23/2024 CLINICAL DATA:  Memory loss EXAM:  CT HEAD WITHOUT CONTRAST TECHNIQUE: Contiguous axial images were obtained from the base of the skull through the vertex without intravenous contrast. RADIATION DOSE REDUCTION: This exam was performed according to the departmental dose-optimization program which includes automated exposure control, adjustment of the mA and/or kV according to patient size and/or use of iterative reconstruction technique. COMPARISON:  December 25, 2023 FINDINGS: Brain: Proportional prominence of the ventricles and sulci, consistent with diffuse cerebral parenchymal volume loss. The ventricles otherwise maintained midline position without midline shift. Gray-white differentiation is preserved.Scattered periventricular white matter hypoattenuation, most consistent with changes of mild chronic ischemic microvascular disease.No evidence of acute territorial infarction, extra-axial fluid collection, hemorrhage, or mass lesion. The basilar cisterns are patent without downward herniation. The cerebellar hemispheres and vermis are well formed without mass lesion or focal attenuation abnormality. Vascular: No hyperdense vessel. Calcified atherosclerotic plaque within the cavernous/supraclinoid internal carotid arteries. Skull: Normal. Negative for  fracture or focal lesion. Sinuses/Orbits: The paranasal sinuses and mastoids are clear.The globes appear intact. No retrobulbar hematoma.Left scleral band again noted. Dislocated left ocular lens. Other: None. IMPRESSION: 1. No acute intracranial abnormality, specifically, no acute hemorrhage, territorial infarction, or intracranial mass. 2. Early global cerebral volume loss with sequelae of chronic ischemic microvascular disease. Electronically Signed   By: Rogelia Myers M.D.   On: 01/23/2024 15:14   CT ABDOMEN PELVIS W CONTRAST Addendum Date: 01/19/2024 ADDENDUM REPORT: 01/19/2024 18:12 ADDENDUM: The portal, splenic, superior mesenteric veins are patent. Electronically Signed   By: Morgane  Naveau M.D.   On: 01/19/2024 18:12   Result Date: 01/19/2024 CLINICAL DATA:  Abdominal pain, acute, nonlocalized EXAM: CT ABDOMEN AND PELVIS WITH CONTRAST TECHNIQUE: Multidetector CT imaging of the abdomen and pelvis was performed using the standard protocol following bolus administration of intravenous contrast. RADIATION DOSE REDUCTION: This exam was performed according to the departmental dose-optimization program which includes automated exposure control, adjustment of the mA and/or kV according to patient size and/or use of iterative reconstruction technique. CONTRAST:  75mL OMNIPAQUE  IOHEXOL  350 MG/ML SOLN COMPARISON:  Ultrasound abdomen 10/30/2023 FINDINGS: Lower chest: Small volume hiatal hernia.  Bibasilar atelectasis. Hepatobiliary: Question nodular hepatic contour. There is a 2 cm right posterior hepatic lobe hypodensity. No gallstones, gallbladder wall thickening, or pericholecystic fluid. No biliary dilatation. Pancreas: No focal lesion. Normal pancreatic contour. No surrounding inflammatory changes. No main pancreatic ductal dilatation. Spleen: Normal in size without focal abnormality. Adrenals/Urinary Tract: No adrenal nodule bilaterally. Bilateral kidneys enhance symmetrically. No hydronephrosis. No  hydroureter. The urinary bladder is unremarkable. No excretion of intravenous contrast of either kidneys on delayed view. Stomach/Bowel: Stomach is within normal limits. No evidence of bowel wall thickening or dilatation. The appendix is not definitely identified with no inflammatory changes in the right lower quadrant to suggest acute appendicitis. Vascular/Lymphatic: No abdominal aorta or iliac aneurysm. Moderate atherosclerotic plaque of the aorta and its branches. No abdominal, pelvic, or inguinal lymphadenopathy. Reproductive: Prostate is unremarkable. Other: Large volume simple free fluid ascites. No intraperitoneal free gas. No organized fluid collection. Musculoskeletal: No abdominal wall hernia or abnormality. No suspicious lytic or blastic osseous lesions. No acute displaced fracture. Multilevel degenerative changes of the spine. IMPRESSION: 1. Large volume simple free fluid ascites. 2. Question cirrhotic morphology of the liver. Associated 2 cm right posterior hepatic lobe hypodensity-if patient is cirrhotic, a hepatocellular carcinoma cannot be excluded. Correlate with liver function tests. Please note that liver protocol enhanced MR and CT are the most sensitive tests for the screening detection of hepatocellular carcinoma in the high  risk setting of cirrhosis. 3. No excretion of intravenous contrast of either kidneys on delayed view. Correlate with renal function tests. 4. Small volume hiatal hernia. 5.  Aortic Atherosclerosis (ICD10-I70.0). Electronically Signed: By: Morgane  Naveau M.D. On: 01/19/2024 17:57    Labs: BNP (last 3 results) Recent Labs    12/30/23 0500 12/31/23 0538 01/19/24 1400  BNP 233.2* 83.3 53.0   Basic Metabolic Panel: Recent Labs  Lab 01/30/24 0500 01/31/24 0447 02/01/24 0724 02/02/24 1355 02/02/24 1357 02/03/24 0635 02/04/24 0519  NA 132* 131* 131*  --  133* 129* 131*  K 4.3 3.7 3.6  --  3.7 3.1* 3.5  CL 99 100 99  --  101 98 98  CO2 24 24 22   --  21* 24  24  GLUCOSE 103* 97 87  --  97 80 86  BUN 7 8 11   --  8 7 5*  CREATININE 1.10 1.03 1.09  --  1.00 0.81 1.05  CALCIUM  7.9* 7.7* 7.9*  --  7.8* 7.8* 7.8*  MG 1.6* 1.6*  --  2.1  --   --   --   PHOS 2.8 3.6  --  3.4 3.3 2.8 2.6   Liver Function Tests: Recent Labs  Lab 01/30/24 0500 01/31/24 0447 02/01/24 0724 02/02/24 1357 02/03/24 0635 02/04/24 0519  AST 58* 56* 65*  --   --   --   ALT 31 30 35  --   --   --   ALKPHOS 133* 135* 148*  --   --   --   BILITOT 1.3* 1.3* 1.0  --   --   --   PROT 6.0* 5.8* 6.2*  --   --   --   ALBUMIN  <1.5* <1.5* <1.5* 1.8* 1.9* 1.7*   No results for input(s): LIPASE, AMYLASE in the last 168 hours. Recent Labs  Lab 02/01/24 0724  AMMONIA 49*   CBC: Recent Labs  Lab 01/30/24 0500 01/31/24 0447 02/01/24 0724 02/02/24 1355 02/03/24 0635  WBC 10.0 9.2 8.2 6.6 6.4  HGB 11.2* 10.9* 11.2* 11.2* 10.5*  HCT 31.7* 30.1* 31.1* 31.2* 29.0*  MCV 89.3 88.8 89.1 90.4 89.0  PLT 246 225 216 206 199   Urinalysis    Component Value Date/Time   COLORURINE AMBER (A) 01/19/2024 2158   APPEARANCEUR CLEAR 01/19/2024 2158   LABSPEC >1.046 (H) 01/19/2024 2158   PHURINE 6.0 01/19/2024 2158   GLUCOSEU NEGATIVE 01/19/2024 2158   HGBUR NEGATIVE 01/19/2024 2158   BILIRUBINUR NEGATIVE 01/19/2024 2158   KETONESUR NEGATIVE 01/19/2024 2158   PROTEINUR 30 (A) 01/19/2024 2158   NITRITE NEGATIVE 01/19/2024 2158   LEUKOCYTESUR NEGATIVE 01/19/2024 2158   Sepsis Labs Recent Labs  Lab 01/31/24 0447 02/01/24 0724 02/02/24 1355 02/03/24 0635  WBC 9.2 8.2 6.6 6.4   Microbiology Recent Results (from the past 240 hours)  Gram stain     Status: None   Collection Time: 01/28/24  3:59 PM   Specimen: Abdomen; Peritoneal Fluid  Result Value Ref Range Status   Specimen Description PERITONEAL  Final   Special Requests NONE  Final   Gram Stain   Final    NO WBC SEEN NO ORGANISMS SEEN Performed at [facility], [address] [place], [place] [address]     Report Status 01/28/2024 FINAL  Final  Culture, body fluid w Gram Stain-bottle     Status: None   Collection Time: 01/28/24  3:59 PM   Specimen: Peritoneal Washings  Result Value Ref  Range Status   Specimen Description PERITONEAL  Final   Special Requests NONE  Final   Culture   Final    NO GROWTH 5 DAYS Performed at [facility], [address] [place], [place] [address]    Report Status 02/02/2024 FINAL  Final   Time coordinating discharge: 35 minutes  SIGNED: Mennie LAMY, MD  [facility] 02/05/2024, 9:39 AM  If 7PM-7AM, please contact night-coverage [email or web address]

## 2024-02-05 NOTE — TOC Transition Note (Signed)
 Transition of Care [facility]) - Discharge Note   Patient Details  Name: Jeffrey Hodges MRN: [id number] Date of Birth: [birth year]  Transition [facility]) CM/SW Contact:  Gwenn Julien Norris, LCSW Phone Number: 02/05/2024, 10:25 AM   Clinical Narrative:  Pt for dc to [facility] today. Spoke to Tammy in admissions who confirmed they are prepared to admit pt to room 146. Pt's wife [name] aware of dc and reports agreeable. RN provided with number for report and PTAR arranged for transport. SW signing off at dc.   Julien Gwenn, MSW, LCSW [phone] (coverage)       Final next level of care: Skilled Nursing Facility Barriers to Discharge: Barriers Resolved   Patient Goals and CMS Choice     Choice offered to / list presented to : Spouse Dora ownership interest in [facility].provided to:: Spouse    Discharge Placement              Patient chooses bed at: Other - please specify in the comment section below: [facility]) Patient to be transferred to facility by: PTAR Name of family member notified: [name]/wife Patient and family notified of of transfer: 02/05/24  Discharge Plan and Services Additional resources added to the After Visit Summary for       Post Acute Care Choice: Skilled Nursing Facility                               Social Drivers of Health (SDOH) Interventions SDOH Screenings   Food Insecurity: Patient Unable To Answer (02/02/2024)  Housing: Patient Unable To Answer (02/02/2024)  Transportation Needs: No Transportation Needs (12/26/2023)  Utilities: Not At Risk (12/26/2023)  Depression (PHQ2-9): Medium Risk (01/19/2024)  Financial Resource Strain: Medium Risk (03/24/2023)  Social Connections: Unknown (03/24/2023)  Stress: Stress Concern Present (03/24/2023)  Tobacco Use: Medium Risk (01/27/2024)     Readmission Risk Interventions     No data to display

## 2024-02-05 NOTE — Plan of Care (Signed)
  Problem: Education: Goal: Knowledge of General Education information will improve Description: Including pain rating scale, medication(s)/side effects and non-pharmacologic comfort measures 02/05/2024 1113 by Terryl Lauraine LABOR, RN Outcome: Adequate for Discharge 02/05/2024 1113 by Terryl Lauraine LABOR, RN Outcome: Adequate for Discharge   Problem: Health Behavior/Discharge Planning: Goal: Ability to manage health-related needs will improve 02/05/2024 1113 by Terryl Lauraine LABOR, RN Outcome: Adequate for Discharge 02/05/2024 1113 by Terryl Lauraine LABOR, RN Outcome: Adequate for Discharge   Problem: Clinical Measurements: Goal: Ability to maintain clinical measurements within normal limits will improve 02/05/2024 1113 by Terryl Lauraine LABOR, RN Outcome: Adequate for Discharge 02/05/2024 1113 by Terryl Lauraine LABOR, RN Outcome: Adequate for Discharge Goal: Will remain free from infection 02/05/2024 1113 by Terryl Lauraine LABOR, RN Outcome: Adequate for Discharge 02/05/2024 1113 by Terryl Lauraine LABOR, RN Outcome: Adequate for Discharge Goal: Diagnostic test results will improve 02/05/2024 1113 by Terryl Lauraine LABOR, RN Outcome: Adequate for Discharge 02/05/2024 1113 by Terryl Lauraine LABOR, RN Outcome: Adequate for Discharge Goal: Respiratory complications will improve 02/05/2024 1113 by Terryl Lauraine LABOR, RN Outcome: Adequate for Discharge 02/05/2024 1113 by Terryl Lauraine LABOR, RN Outcome: Adequate for Discharge Goal: Cardiovascular complication will be avoided 02/05/2024 1113 by Terryl Lauraine LABOR, RN Outcome: Adequate for Discharge 02/05/2024 1113 by Terryl Lauraine LABOR, RN Outcome: Adequate for Discharge   Problem: Activity: Goal: Risk for activity intolerance will decrease 02/05/2024 1113 by Terryl Lauraine LABOR, RN Outcome: Adequate for Discharge 02/05/2024 1113 by Terryl Lauraine LABOR, RN Outcome: Adequate for Discharge   Problem: Nutrition: Goal: Adequate nutrition will be maintained 02/05/2024 1113 by Terryl Lauraine LABOR, RN Outcome:  Adequate for Discharge 02/05/2024 1113 by Terryl Lauraine LABOR, RN Outcome: Adequate for Discharge   Problem: Coping: Goal: Level of anxiety will decrease 02/05/2024 1113 by Terryl Lauraine LABOR, RN Outcome: Adequate for Discharge 02/05/2024 1113 by Terryl Lauraine LABOR, RN Outcome: Adequate for Discharge   Problem: Elimination: Goal: Will not experience complications related to bowel motility 02/05/2024 1113 by Terryl Lauraine LABOR, RN Outcome: Adequate for Discharge 02/05/2024 1113 by Terryl Lauraine LABOR, RN Outcome: Adequate for Discharge Goal: Will not experience complications related to urinary retention 02/05/2024 1113 by Terryl Lauraine LABOR, RN Outcome: Adequate for Discharge 02/05/2024 1113 by Terryl Lauraine LABOR, RN Outcome: Adequate for Discharge   Problem: Pain Managment: Goal: General experience of comfort will improve and/or be controlled 02/05/2024 1113 by Terryl Lauraine LABOR, RN Outcome: Adequate for Discharge 02/05/2024 1113 by Terryl Lauraine LABOR, RN Outcome: Adequate for Discharge   Problem: Safety: Goal: Ability to remain free from injury will improve 02/05/2024 1113 by Terryl Lauraine LABOR, RN Outcome: Adequate for Discharge 02/05/2024 1113 by Terryl Lauraine LABOR, RN Outcome: Adequate for Discharge   Problem: Skin Integrity: Goal: Risk for impaired skin integrity will decrease 02/05/2024 1113 by Terryl Lauraine LABOR, RN Outcome: Adequate for Discharge 02/05/2024 1113 by Terryl Lauraine LABOR, RN Outcome: Adequate for Discharge

## 2024-02-12 DIAGNOSIS — Z515 Encounter for palliative care: Secondary | ICD-10-CM | POA: Diagnosis not present

## 2024-02-12 DIAGNOSIS — K7682 Hepatic encephalopathy: Secondary | ICD-10-CM | POA: Diagnosis not present

## 2024-02-12 DIAGNOSIS — I10 Essential (primary) hypertension: Secondary | ICD-10-CM | POA: Diagnosis not present

## 2024-02-12 DIAGNOSIS — F321 Major depressive disorder, single episode, moderate: Secondary | ICD-10-CM | POA: Diagnosis not present

## 2024-02-13 DIAGNOSIS — I10 Essential (primary) hypertension: Secondary | ICD-10-CM | POA: Diagnosis not present

## 2024-02-13 DIAGNOSIS — K7682 Hepatic encephalopathy: Secondary | ICD-10-CM | POA: Diagnosis not present

## 2024-02-13 DIAGNOSIS — E785 Hyperlipidemia, unspecified: Secondary | ICD-10-CM | POA: Diagnosis not present

## 2024-02-13 DIAGNOSIS — F339 Major depressive disorder, recurrent, unspecified: Secondary | ICD-10-CM | POA: Diagnosis not present

## 2024-02-13 DIAGNOSIS — F411 Generalized anxiety disorder: Secondary | ICD-10-CM | POA: Diagnosis not present

## 2024-02-16 ENCOUNTER — Other Ambulatory Visit: Payer: Self-pay | Admitting: Nurse Practitioner

## 2024-02-17 DIAGNOSIS — F411 Generalized anxiety disorder: Secondary | ICD-10-CM | POA: Diagnosis not present

## 2024-02-17 DIAGNOSIS — E785 Hyperlipidemia, unspecified: Secondary | ICD-10-CM | POA: Diagnosis not present

## 2024-02-17 DIAGNOSIS — K7682 Hepatic encephalopathy: Secondary | ICD-10-CM | POA: Diagnosis not present

## 2024-02-17 DIAGNOSIS — I10 Essential (primary) hypertension: Secondary | ICD-10-CM | POA: Diagnosis not present

## 2024-02-19 ENCOUNTER — Ambulatory Visit: Payer: Self-pay | Admitting: *Deleted

## 2024-02-19 NOTE — Telephone Encounter (Signed)
 FYI Only or Action Required?: FYI only for provider.  Patient was last seen in primary care on 01/19/2024 by [name], NP.  Called Nurse Triage reporting informational call.  Symptoms began chronic.  Interventions attempted: Other: Mental health care.  Symptoms are: feels needs prolonged support.  Triage Disposition: Information or Advice Only Call  Patient/caregiver understands and will follow disposition?: yes   Reason for Disposition  General information question, no triage required and triager able to answer question  Answer Assessment - Initial Assessment Questions 1. REASON FOR CALL: What is the main reason for your call? or How can I best help you?     [name] is calling for local resources for inpatient alcohol and mental health support. She reports patient is in facity now- but would like other options- including 30 day programs.  Advised per resource list- [facility] [place], [facility], can call [facility] for local resources for inpatient care.  Protocols used: Information Only Call - No Triage-A-AH  Copied from CRM [id number]. Topic: Clinical - Red Word Triage >> Feb 19, 2024  2:28 PM Donna BRAVO wrote: Red Word that prompted transfer to Nurse Triage: patient wife [name] calling in. [name] stating he has an alchol problem and is wondering if [facility]   is a good place to take him.

## 2024-02-19 NOTE — Telephone Encounter (Signed)
 noted

## 2024-02-24 ENCOUNTER — Telehealth: Payer: Self-pay | Admitting: Gastroenterology

## 2024-02-24 DIAGNOSIS — K7010 Alcoholic hepatitis without ascites: Secondary | ICD-10-CM

## 2024-02-24 NOTE — Telephone Encounter (Signed)
 Patient wife is calling to have her husband fluid drained from his body. Patient wife is requesting a call back. Please advise.

## 2024-02-24 NOTE — Telephone Encounter (Signed)
 Dr Stacia-  OK to order therapeutic paracentesis on patient? Last para was 02/02/24 yielding 4.7 L.  If paracentesis is recommended, would you like any lab testing on the peritoneal fluid?  He has upcoming office appointment with GI 03/19/24

## 2024-02-25 NOTE — Telephone Encounter (Signed)
 Spoke to patient's wife. Patient has been scheduled for paracentesis tomorrow, 02/26/24 at [facility] at 1 pm, 1245 pm arrival. Also advised that patient should have labs completed at our office beforehand to check kidney function and electrolytes. Mrs.Mccorvey verbalizes understanding of this information.

## 2024-02-26 ENCOUNTER — Ambulatory Visit: Payer: Self-pay

## 2024-02-26 ENCOUNTER — Other Ambulatory Visit (INDEPENDENT_AMBULATORY_CARE_PROVIDER_SITE_OTHER)

## 2024-02-26 ENCOUNTER — Ambulatory Visit (HOSPITAL_COMMUNITY)
Admission: RE | Admit: 2024-02-26 | Discharge: 2024-02-26 | Disposition: A | Discharge status: Home / Self Care | Source: Ambulatory Visit | Attending: Gastroenterology | Admitting: Gastroenterology

## 2024-02-26 DIAGNOSIS — R188 Other ascites: Secondary | ICD-10-CM | POA: Diagnosis not present

## 2024-02-26 DIAGNOSIS — K7010 Alcoholic hepatitis without ascites: Secondary | ICD-10-CM | POA: Diagnosis not present

## 2024-02-26 DIAGNOSIS — K7011 Alcoholic hepatitis with ascites: Secondary | ICD-10-CM | POA: Insufficient documentation

## 2024-02-26 HISTORY — PX: IR PARACENTESIS: IMG2679

## 2024-02-26 LAB — COMPREHENSIVE METABOLIC PANEL WITH GFR
ALT: 14 U/L (ref 0–53)
AST: 33 U/L (ref 0–37)
Albumin: 2.6 g/dL — ABNORMAL LOW (ref 3.5–5.2)
Alkaline Phosphatase: 102 U/L (ref 39–117)
BUN: 7 mg/dL (ref 6–23)
CO2: 30 meq/L (ref 19–32)
Calcium: 7.8 mg/dL — ABNORMAL LOW (ref 8.4–10.5)
Chloride: 94 meq/L — ABNORMAL LOW (ref 96–112)
Creatinine, Ser: 0.61 mg/dL (ref 0.40–1.50)
GFR: 108 mL/min (ref >60.00)
Glucose, Bld: 73 mg/dL (ref 70–99)
Potassium: 2.9 meq/L — ABNORMAL LOW (ref 3.5–5.1)
Sodium: 133 meq/L — ABNORMAL LOW (ref 135–145)
Total Bilirubin: 0.8 mg/dL (ref 0.2–1.2)
Total Protein: 6.3 g/dL (ref 6.0–8.3)

## 2024-02-26 LAB — GRAM STAIN

## 2024-02-26 LAB — BODY FLUID CELL COUNT WITH DIFFERENTIAL
Eos, Fluid: 0 %
Lymphs, Fluid: 60 %
Monocyte-Macrophage-Serous Fluid: 39 % — ABNORMAL LOW (ref 50–90)
Neutrophil Count, Fluid: 1 % (ref 0–25)
Total Nucleated Cell Count, Fluid: 81 uL (ref 0–1000)

## 2024-02-26 MED ORDER — ALBUMIN HUMAN 25 % IV SOLN
50.0000 g | Freq: Once | INTRAVENOUS | Status: AC
  Administered 2024-02-26: 50 g via INTRAVENOUS

## 2024-02-26 MED ORDER — LIDOCAINE-EPINEPHRINE 1 %-1:100000 IJ SOLN
INTRAMUSCULAR | Status: AC
  Filled 2024-02-26: qty 1

## 2024-02-26 MED ORDER — ALBUMIN HUMAN 25 % IV SOLN
INTRAVENOUS | Status: AC
  Filled 2024-02-26: qty 200

## 2024-02-26 NOTE — Procedures (Signed)
 PROCEDURE SUMMARY:  Successful ultrasound guided paracentesis from the right lower quadrant.  Yielded 7.5 L of clear yellow fluid.  No immediate complications.  The patient tolerated the procedure well.   Specimen sent for labs.  EBL < 2 mL  The patient has required >/=2 paracenteses in a 30 day period and a screening evaluation by the [facility] has been arranged.   Lavin Petteway, AGACNP-BC 02/26/2024, 2:16 PM

## 2024-02-26 NOTE — Telephone Encounter (Signed)
 Copied from CRM #[id number]. Topic: Clinical - Medication Question >> Feb 26, 2024 11:41 AM Drema MATSU wrote: Reason for CRM: Patient has an appointment on tomorrow and has a really bad cough she thinks it may be bronchitis. Wife wants to know if some cough medicine can to settle it. Reason for Disposition  Triager needs further essential information from caller in order to complete triage  Answer Assessment - Initial Assessment Questions 1. REASON FOR CALL: What is the main reason for your call? or How can I best help you?     Wife answered with concern for bad cough pt is experiencing - upon further questioning, wife informed triager that pt is currently in a procedure at [facility], getting fluid pulled off Triager advised wife to have pt call back to further triage. Caregiver verbalized understanding and to call back.  2. SYMPTOMS : Do you have any symptoms?      cough 3. OTHER QUESTIONS: Do you have any other questions?     N/a  Protocols used: Information Only Call - No Triage-A-AH

## 2024-02-26 NOTE — Telephone Encounter (Signed)
 FYI Only or Action Required?: Action required by provider: patient requesting medication for cough until his appointment tomorrow.  Patient was last seen in primary care on 01/19/2024 by [name], NP.  Called Nurse Triage reporting Cough.  Symptoms began several days ago.  Symptoms are: gradually worsening.  Triage Disposition: See Physician Within 24 Hours, Urgent Home Treatment With Follow-up Call  Patient/caregiver understands and will follow disposition?: Yes       Copied from CRM #[id number]. Topic: Clinical - Medication Question >> Feb 26, 2024 11:41 AM Drema MATSU wrote: Reason for CRM: Patient has an appointment on tomorrow and has a really bad cough she thinks it may be bronchitis. Wife wants to know if some cough medicine can to settle it.      Reason for Disposition  [1] Continuous (nonstop) coughing interferes with work or school AND [2] no improvement using cough treatment per Care Advice  Answer Assessment - Initial Assessment Questions 1. ONSET: When did the cough begin?      A few days ago  2. SEVERITY: How bad is the cough today?      Moderate  3. SPUTUM: Describe the color of your sputum (e.g., none, dry cough; clear, white, yellow, green)     No 4. HEMOPTYSIS: Are you coughing up any blood? If Yes, ask: How much? (e.g., flecks, streaks, tablespoons, etc.)     No 5. DIFFICULTY BREATHING: Are you having difficulty breathing? If Yes, ask: How bad is it? (e.g., mild, moderate, severe)      No 6. FEVER: Do you have a fever? If Yes, ask: What is your temperature, how was it measured, and when did it start?     Unsure 7. CARDIAC HISTORY: Do you have any history of heart disease? (e.g., heart attack, congestive heart failure)      Yes 8. LUNG HISTORY: Do you have any history of lung disease?  (e.g., pulmonary embolus, asthma, emphysema)     No 9. PE RISK FACTORS: Do you have a history of blood clots? (or: recent major surgery, recent  prolonged travel, bedridden)     No 10. OTHER SYMPTOMS: Do you have any other symptoms? (e.g., runny nose, wheezing, chest pain)       No  Protocols used: Cough - Acute Non-Productive-A-AH

## 2024-02-27 ENCOUNTER — Ambulatory Visit: Payer: Self-pay | Admitting: Family

## 2024-02-27 ENCOUNTER — Ambulatory Visit (INDEPENDENT_AMBULATORY_CARE_PROVIDER_SITE_OTHER)
Admission: RE | Admit: 2024-02-27 | Discharge: 2024-02-27 | Disposition: A | Discharge status: Home / Self Care | Source: Ambulatory Visit | Attending: Nurse Practitioner | Admitting: Nurse Practitioner

## 2024-02-27 ENCOUNTER — Ambulatory Visit (INDEPENDENT_AMBULATORY_CARE_PROVIDER_SITE_OTHER): Admitting: Nurse Practitioner

## 2024-02-27 VITALS — BP 100/60 | HR 105 | Temp 98.5°F | Ht 73.0 in | Wt 176.0 lb

## 2024-02-27 DIAGNOSIS — R059 Cough, unspecified: Secondary | ICD-10-CM | POA: Diagnosis not present

## 2024-02-27 DIAGNOSIS — R600 Localized edema: Secondary | ICD-10-CM | POA: Diagnosis not present

## 2024-02-27 DIAGNOSIS — K7460 Unspecified cirrhosis of liver: Secondary | ICD-10-CM

## 2024-02-27 DIAGNOSIS — R052 Subacute cough: Secondary | ICD-10-CM | POA: Diagnosis not present

## 2024-02-27 DIAGNOSIS — R188 Other ascites: Secondary | ICD-10-CM | POA: Diagnosis not present

## 2024-02-27 DIAGNOSIS — I10 Essential (primary) hypertension: Secondary | ICD-10-CM

## 2024-02-27 DIAGNOSIS — R918 Other nonspecific abnormal finding of lung field: Secondary | ICD-10-CM | POA: Diagnosis not present

## 2024-02-27 DIAGNOSIS — K7290 Hepatic failure, unspecified without coma: Secondary | ICD-10-CM

## 2024-02-27 LAB — PATHOLOGIST SMEAR REVIEW

## 2024-02-27 MED ORDER — DOXYCYCLINE HYCLATE 100 MG PO TABS: 100.0000 mg | ORAL_TABLET | Freq: Two times a day (BID) | ORAL | 0 refills | Status: AC

## 2024-02-27 NOTE — Progress Notes (Signed)
 Acute Office Visit  Subjective:     Patient ID: Jeffrey Hodges, male    DOB: Aug 24, 1965, 58 y.o.   MRN: [id number]  Chief Complaint  Patient presents with   Hospitalization Follow-up    Pt complains of ongoing coughing for a month.     HPI Patient is in today for Hospital follow-up.    Patient went to the ED on 01/27/2024 by EMS for confusion.  Patient was admitted to the hospital for further evaluation.  Patient was discharged from the hospital on 02/05/2024 supposed to follow-up in 1 week.  Patient was discharged to a skilled nursing facility.  In the hospital he was a limited DO NOT RESUSCITATE.SABRA  Patient was given thiamine  and lactulose  20 g twice daily titrate to 2-3 BMs a day.  Rifaximin  was started by GI MRI of the liver showed benign hemangioma.  Had paracentesis on 716 with 7 L of fluid removed.  Repeat paracentesis on 725 with 4.7 L patient was placed on Lasix  60 mg daily and Aldactone  50 mg daily.  Did recommend PT and OT  Cough:  Discussed the use of AI scribe software for clinical note transcription with the patient, who gave verbal consent to proceed.  History of Present Illness Jeffrey Hodges is a 58 year old male with ascites who presents with worsening cough and fluid management issues.  He was hospitalized from July 15 to February 05, 2024, due to complications from fluid accumulation in his abdomen. During this time, he underwent multiple paracentesis procedures, removing 7 liters, 4.7 liters, and 7.5 liters of fluid. After hospitalization, he was transferred to a nursing facility and discharged on February 19, 2024. He continues to experience significant fluid accumulation. His current medication regimen includes furosemide  60 mg daily and spironolactone  50 mg daily for fluid retention. He also takes lactulose  for ammonia levels but has stopped due to excessive bowel movements. He is uncertain about his use of Xifaxan  550 mg twice daily, which is intended for ammonia level  management. He has regular bowel movements but stopped taking lactulose  due to frequent incontinence. He is concerned about maintaining a balance to prevent confusion related to high ammonia levels.  He has a worsening cough that began approximately four to six weeks ago, occasionally producing sputum that is not fully expectorated. No fever or chills, but he experiences shortness of breath associated with the cough. He also reports occasional headaches and dizziness following prolonged coughing episodes.  He experiences significant fatigue and weakness, impacting his mobility. He uses a wheelchair but can walk short distances. He has not been set up with at-home physical therapy and manages his strength training independently.  He has difficulty sleeping, particularly after fluid drainage procedures. He has a history of anxiety and depression, previously managed with fluoxetine , which he found ineffective.    Review of Systems  Constitutional:  Positive for malaise/fatigue. Negative for chills and fever.  Respiratory:  Positive for cough, sputum production and shortness of breath.   Neurological:  Positive for dizziness (with coughing) and headaches.  Psychiatric/Behavioral:  The patient has insomnia.         Objective:    BP 100/60   Pulse (!) 105   Temp 98.5 F (36.9 C) (Oral)   Ht 6' 1 (1.854 m)   Wt 176 lb (79.8 kg) Comment: 170 without shoes.  SpO2 92%   BMI 23.22 kg/m  BP Readings from Last 3 Encounters:  02/27/24 100/60  02/26/24 99/64  02/05/24 115/84  Wt Readings from Last 3 Encounters:  02/27/24 176 lb (79.8 kg)  02/04/24 160 lb 15 oz (73 kg)  01/19/24 192 lb 9.6 oz (87.4 kg)   SpO2 Readings from Last 3 Encounters:  02/27/24 92%  02/05/24 95%  01/23/24 97%      Physical Exam Vitals and nursing note reviewed.  Constitutional:      Appearance: Normal appearance.  Cardiovascular:     Rate and Rhythm: Normal rate and regular rhythm.     Heart sounds:  Normal heart sounds.  Pulmonary:     Effort: Pulmonary effort is normal.     Comments: Decreased bilateral lower lobes  Possible crackles in the RLL Abdominal:     General: Bowel sounds are normal. There is no distension.     Tenderness: There is no abdominal tenderness.  Musculoskeletal:     Right lower leg: 2+ Pitting Edema present.     Left lower leg: 2+ Pitting Edema present.  Neurological:     Mental Status: He is alert.     No results found for any visits on 02/27/24.      Assessment & Plan:   Problem List Items Addressed This Visit       Cardiovascular and Mediastinum   Primary hypertension   Relevant Orders   CBC   Comprehensive metabolic panel with GFR     Digestive   Decompensated cirrhosis (HCC)   Relevant Orders   Ammonia   CBC   Comprehensive metabolic panel with GFR   Other Visit Diagnoses       Subacute cough    -  Primary   Relevant Orders   DG Chest 2 View     Lower extremity edema       Relevant Orders   Brain natriuretic peptide      Assessment and Plan Assessment & Plan Ascites with lower extremity edema and fluid overload Recent hospitalization for fluid overload with multiple paracentesis procedures. Currently on diuretics to manage fluid retention. Lower extremity edema decreases with leg elevation. - Continue Lasix  60 mg daily. - Continue spironolactone  50 mg daily. - Elevate legs to reduce edema.  Chronic cough, under evaluation for infection versus fluid overload Cough present for 4-6 weeks, worsening over time. Differential includes infection versus fluid overload causing pulmonary congestion. Recent hospitalization and nursing home stay increase infection risk. - Order chest x-ray to evaluate for pneumonia or fluid overload. - Check blood work to assess for infection or fluid status.  Hepatic encephalopathy with altered mental status Altered mental status likely due to hepatic encephalopathy. Lactulose  previously discontinued  due to excessive bowel movements. Rifaximin  prescribed but adherence uncertain. Discussed importance of lactulose  for bowel movement regulation to manage ammonia levels. - Reinstate lactulose  with goal of 2-3 bowel movements per day. Instead of doing 15ml TID they can try 15ml BID - Ensure rifaximin  550 mg twice daily is being taken. - Monitor ammonia levels and mental status.  Depression and anxiety Previously managed with fluoxetine , which was ineffective. No current psychiatric follow-up. Discussed potential referral to outpatient psychiatrist for medication management. Reassured that consultation would be outpatient. - Consider referral to outpatient psychiatrist for medication management.   No orders of the defined types were placed in this encounter.   Return in about 4 weeks (around 03/26/2024) for edema/cirrhosis/cough .  Adina Crandall, NP

## 2024-02-27 NOTE — Telephone Encounter (Signed)
Will evaluate today in office.

## 2024-02-27 NOTE — Patient Instructions (Signed)
 Nice to see you today Start back on the lactulose . We want 2-3 bowel movements a day. I want to see you in 1 month, sooner if you need me

## 2024-02-28 LAB — CBC
HCT: 36.1 % — ABNORMAL LOW (ref 38.5–50.0)
Hemoglobin: 11.7 g/dL — ABNORMAL LOW (ref 13.2–17.1)
MCH: 31.3 pg (ref 27.0–33.0)
MCHC: 32.4 g/dL (ref 32.0–36.0)
MCV: 96.5 fL (ref 80.0–100.0)
MPV: 9.8 fL (ref 7.5–12.5)
Platelets: 368 Thousand/uL (ref 140–400)
RBC: 3.74 Million/uL — ABNORMAL LOW (ref 4.20–5.80)
RDW: 15.4 % — ABNORMAL HIGH (ref 11.0–15.0)
WBC: 7.6 Thousand/uL (ref 3.8–10.8)

## 2024-02-28 LAB — COMPREHENSIVE METABOLIC PANEL WITH GFR
AG Ratio: 0.7 (calc) — ABNORMAL LOW (ref 1.0–2.5)
ALT: 14 U/L (ref 9–46)
AST: 37 U/L — ABNORMAL HIGH (ref 10–35)
Albumin: 2.2 g/dL — ABNORMAL LOW (ref 3.6–5.1)
Alkaline phosphatase (APISO): 102 U/L (ref 35–144)
BUN/Creatinine Ratio: 9 (calc) (ref 6–22)
BUN: 6 mg/dL — ABNORMAL LOW (ref 7–25)
CO2: 29 mmol/L (ref 20–32)
Calcium: 7.8 mg/dL — ABNORMAL LOW (ref 8.6–10.3)
Chloride: 95 mmol/L — ABNORMAL LOW (ref 98–110)
Creat: 0.67 mg/dL — ABNORMAL LOW (ref 0.70–1.30)
Globulin: 3.3 g/dL (ref 1.9–3.7)
Glucose, Bld: 97 mg/dL (ref 65–99)
Potassium: 3.6 mmol/L (ref 3.5–5.3)
Sodium: 132 mmol/L — ABNORMAL LOW (ref 135–146)
Total Bilirubin: 0.9 mg/dL (ref 0.2–1.2)
Total Protein: 5.5 g/dL — ABNORMAL LOW (ref 6.1–8.1)
eGFR: 110 mL/min/1.73m2 (ref >=60)

## 2024-02-28 LAB — AMMONIA: Ammonia: 32 umol/L (ref <=72)

## 2024-02-28 LAB — BRAIN NATRIURETIC PEPTIDE: Brain Natriuretic Peptide: 59 pg/mL (ref <100)

## 2024-02-29 ENCOUNTER — Ambulatory Visit: Payer: Self-pay | Admitting: Gastroenterology

## 2024-02-29 DIAGNOSIS — K7010 Alcoholic hepatitis without ascites: Secondary | ICD-10-CM

## 2024-02-29 NOTE — Progress Notes (Signed)
 Mr. Majano, Your labs looked okay.  Your potassium was a little bit low, but I see that it was checked again the next day and was normal. As it appears you are still having trouble with fluid in your abdomen, I recommend you increase your Aldactone  to 100 mg daily (this would be 2 tablets). You have an appointment with me on September 5. I would like to repeat your labs anytime earlier that week. We can place a new prescription for the increased dose of Aldactone  at your visit  Team, Please place order for repeat CMP and INR for September 2

## 2024-03-02 ENCOUNTER — Telehealth: Payer: Self-pay | Admitting: Gastroenterology

## 2024-03-02 DIAGNOSIS — K709 Alcoholic liver disease, unspecified: Secondary | ICD-10-CM

## 2024-03-02 DIAGNOSIS — K7010 Alcoholic hepatitis without ascites: Secondary | ICD-10-CM

## 2024-03-02 LAB — CULTURE, BODY FLUID W GRAM STAIN -BOTTLE: Culture: NO GROWTH

## 2024-03-02 NOTE — Telephone Encounter (Signed)
 Dottie, pls contact wife and patient, if he has not been taking Furosemide  20mg  x 3 (60mg ) once daily with Spironolactone  100mg  every day, he needs to restart Furosemide  today.   Pls verify if he is maintaining a low salt diet, no table salt  and no processed foods.   His last paracentesis was less than one week ago therefore I hesitate to repeat a paracentesis at this time.   Have patient check home weights daily   Patient to go to the emergency room if he develops any CP, SOB or severe abdominal distension and worsening LE edema.   Pls schedule him for an office appt with P[facility] A APP this week, let me know if no appointments available. If appointment not available, send him to our lab on Friday am 8/22 for a CMP and PT/INR.

## 2024-03-02 NOTE — Telephone Encounter (Signed)
 Patient with decompensated cirrhosis, recent hepatic encephalopathy. Had paracentesis 02/26/24 yielding 7.5 L peritoneal fluid. Patient is requesting another paracentesis due to abdominal distention. Also having lower extremity edema. Denies any SOB, fever.  Patient is currently taking spironolactone  50 mg, 2 tablets daily.   It appears patient is supposed to also be taking Lasix  20 mg, 3 tablets daily but per patient's wife/caregiver, he has not been taking this.  Please advise.

## 2024-03-02 NOTE — Telephone Encounter (Signed)
 First available appointment would be for 03/16/24. Patient is already scheduled with Dr Stacia for follow up on 03/19/24.

## 2024-03-02 NOTE — Telephone Encounter (Signed)
 Spoke to patients wife who states that patient has not been following any dietary restrictions as she says they have not previously been instructed on this. We discussed the importance of a low sodium diet in cirrhosis and I recommended patient have intake of 2 grams sodium or less daily. Advised reading labels is important etc since many foods we eat contain hidden salt. We also discussed that current spironolactone  dosing should be at 100 mg daily and patient should restart furosemide  60 mg daily (elimination of this may be why he has re accumulated fluid so quickly following previous paracentesis). Wife says he may be taking it. Maybe I just missed the bottle in the bag. Asked wife to just make sure he is getting both medications from this point forward. I have advised that patient should get labs at our office on Friday 03/05/24 and orders placed in EPIC. She verbalizes understanding.  ER precautions discussed; symptoms including increasing SOB, severe abdominal pain/distention, worsening lower extremity edema, or chest pain should be immediately evaluated in the emergency department.

## 2024-03-02 NOTE — Telephone Encounter (Signed)
 Inbound call from patient requesting a call to discuss scheduling another paracentesis. States patient abdomen is bloated again. Please advise, thank you

## 2024-03-02 NOTE — Telephone Encounter (Signed)
 Ok, patient to have labs done Friday 8/22 in case he needs a paracentesis early next week.

## 2024-03-04 NOTE — Telephone Encounter (Signed)
 I have spoken to Mrs.Bredeson who states that patient has gained 2 pounds in 1 day. She is advised to take patient to the emergency room for sooner evaluation/treatment. She states she is not sure they will do this due to the wait in the ER but is aware this is our recommendation.

## 2024-03-04 NOTE — Telephone Encounter (Signed)
 Inbound call from patient spouse requesting f/u call to discuss weight gain. Please advise.   Thank you

## 2024-03-17 ENCOUNTER — Telehealth: Payer: Self-pay | Admitting: Gastroenterology

## 2024-03-17 ENCOUNTER — Other Ambulatory Visit: Payer: Self-pay | Admitting: Nurse Practitioner

## 2024-03-17 ENCOUNTER — Other Ambulatory Visit (INDEPENDENT_AMBULATORY_CARE_PROVIDER_SITE_OTHER)

## 2024-03-17 DIAGNOSIS — K7010 Alcoholic hepatitis without ascites: Secondary | ICD-10-CM | POA: Diagnosis not present

## 2024-03-17 DIAGNOSIS — K709 Alcoholic liver disease, unspecified: Secondary | ICD-10-CM

## 2024-03-17 DIAGNOSIS — K7031 Alcoholic cirrhosis of liver with ascites: Secondary | ICD-10-CM

## 2024-03-17 LAB — COMPREHENSIVE METABOLIC PANEL WITH GFR
ALT: 10 U/L (ref 0–53)
AST: 27 U/L (ref 0–37)
Albumin: 2.3 g/dL — ABNORMAL LOW (ref 3.5–5.2)
Alkaline Phosphatase: 143 U/L — ABNORMAL HIGH (ref 39–117)
BUN: 5 mg/dL — ABNORMAL LOW (ref 6–23)
CO2: 31 meq/L (ref 19–32)
Calcium: 8 mg/dL — ABNORMAL LOW (ref 8.4–10.5)
Chloride: 93 meq/L — ABNORMAL LOW (ref 96–112)
Creatinine, Ser: 0.77 mg/dL (ref 0.40–1.50)
GFR: 100.63 mL/min (ref >60.00)
Glucose, Bld: 125 mg/dL — ABNORMAL HIGH (ref 70–99)
Potassium: 3 meq/L — ABNORMAL LOW (ref 3.5–5.1)
Sodium: 133 meq/L — ABNORMAL LOW (ref 135–145)
Total Bilirubin: 0.9 mg/dL (ref 0.2–1.2)
Total Protein: 7.1 g/dL (ref 6.0–8.3)

## 2024-03-17 LAB — PROTIME-INR
INR: 1.2 ratio — ABNORMAL HIGH (ref 0.8–1.0)
Prothrombin Time: 13.1 s (ref 9.6–13.1)

## 2024-03-17 MED ORDER — SPIRONOLACTONE 50 MG PO TABS: 50.0000 mg | ORAL_TABLET | Freq: Every day | ORAL | 0 refills | Status: DC

## 2024-03-17 NOTE — Telephone Encounter (Signed)
 Patient was supposed to have labs this week. He should come for these as requested by Dr Stacia prior to his 03/19/24 appointment.  Last paracentesis was 02/26/24 yielding 7.5 L fluid. Patient/wife were advised to go to the ER on 03/04/24 as wife c/o 2 pound weight gain in 1 day, lower extremity edema and abdominal distention. It does not appear patient ever went for evaluation as recommended.  Left message for [name] to call back.

## 2024-03-17 NOTE — Telephone Encounter (Signed)
 Copied from CRM #[id number]. Topic: Clinical - Medication Refill >> Mar 17, 2024 11:07 AM Burnard DEL wrote: Medication: spironolactone  (ALDACTONE ) 50 MG tablet  Has the patient contacted their pharmacy? No (Agent: If no, request that the patient contact the pharmacy for the refill. If patient does not wish to contact the pharmacy document the reason why and proceed with request.) (Agent: If yes, when and what did the pharmacy advise?)  This is the patient's preferred pharmacy:  [id number] [place], [place] - [address] [place] [address] Phone: [phone] Fax: [phone]   Is this the correct pharmacy for this prescription? Yes If no, delete pharmacy and type the correct one.   Has the prescription been filled recently? No  Is the patient out of the medication? Yes  Has the patient been seen for an appointment in the last year OR does the patient have an upcoming appointment? Yes  Can we respond through MyChart? Yes  Agent: Please be advised that Rx refills may take up to 3 business days. We ask that you follow-up with your pharmacy.

## 2024-03-17 NOTE — Telephone Encounter (Signed)
 Spoke to Jeffrey Hodges. She is requesting that patient go ahead and get set up for paracentesis next week. States that patient has an appointment with Dr Stacia on 03/19/24. She is advised that patient was to come for labs earlier this week in preparation for 9/5 appointment. She is asked to have patient come for labs today or tomorrow as requested.  Mrs.Bhatnagar states that patient does have significant abdominal distention and again notes swelling in his feet/ankles. No SOB, no fever.   Please advise.SABRASABRA

## 2024-03-17 NOTE — Telephone Encounter (Signed)
 Spoke to Jeffrey Hodges. Patient has been scheduled for paracentesis on Friday, 03/19/24 at 1 pm with 1230 pm arrival at [facility] following his 1050 am appointment with Dr Stacia. She verbalizes understanding.

## 2024-03-17 NOTE — Telephone Encounter (Signed)
 Inbound call from patients wife requesting to speak with Jeffrey Hodges. She states patient is coming to see Dr. Stacia on 9/5 and wants to know if its too soon to set up a paracentesis. Requesting a call back to discuss. Please advise.

## 2024-03-18 ENCOUNTER — Other Ambulatory Visit: Payer: Self-pay | Admitting: Nurse Practitioner

## 2024-03-18 NOTE — Telephone Encounter (Unsigned)
 Copied from CRM #[id number]. Topic: Clinical - Medication Refill >> Mar 18, 2024  4:06 PM Viola F wrote: Medication:  atorvastatin  (LIPITOR) 80 MG tablet [[id number]] potassium chloride  (KLOR-CON ) 10 MEQ tablet [[id number]] tamsulosin  (FLOMAX ) 0.4 MG CAPS capsule [[id number]] pantoprazole  (PROTONIX ) 40 MG tablet [[id number]] furosemide  (LASIX ) 20 MG tablet [[id number]]  Has the patient contacted their pharmacy? Yes (Agent: If no, request that the patient contact the pharmacy for the refill. If patient does not wish to contact the pharmacy document the reason why and proceed with request.) (Agent: If yes, when and what did the pharmacy advise?)  This is the patient's preferred pharmacy:  [id number] [place], [place] - [address] [place] [address] Phone: [phone] Fax: [phone]  [facility] [address] [place] [address] Phone: [phone] Fax: [phone]  Is this the correct pharmacy for this prescription? Yes If no, delete pharmacy and type the correct one.   Has the prescription been filled recently? Yes  Is the patient out of the medication? Yes  Has the patient been seen for an appointment in the last year OR does the patient have an upcoming appointment? Yes  Can we respond through MyChart? Yes  Agent: Please be advised that Rx refills may take up to 3 business days. We ask that you follow-up with your pharmacy.

## 2024-03-19 ENCOUNTER — Ambulatory Visit: Admitting: Gastroenterology

## 2024-03-19 ENCOUNTER — Ambulatory Visit (HOSPITAL_COMMUNITY)
Admission: RE | Admit: 2024-03-19 | Discharge: 2024-03-19 | Disposition: A | Discharge status: Home / Self Care | Source: Ambulatory Visit | Attending: Gastroenterology | Admitting: Gastroenterology

## 2024-03-19 ENCOUNTER — Encounter: Payer: Self-pay | Admitting: Gastroenterology

## 2024-03-19 VITALS — BP 118/78 | HR 75 | Ht 72.0 in | Wt 181.4 lb

## 2024-03-19 DIAGNOSIS — K7682 Hepatic encephalopathy: Secondary | ICD-10-CM

## 2024-03-19 DIAGNOSIS — K709 Alcoholic liver disease, unspecified: Secondary | ICD-10-CM

## 2024-03-19 DIAGNOSIS — K7031 Alcoholic cirrhosis of liver with ascites: Secondary | ICD-10-CM | POA: Insufficient documentation

## 2024-03-19 DIAGNOSIS — E876 Hypokalemia: Secondary | ICD-10-CM | POA: Diagnosis not present

## 2024-03-19 DIAGNOSIS — R188 Other ascites: Secondary | ICD-10-CM | POA: Diagnosis not present

## 2024-03-19 DIAGNOSIS — K7460 Unspecified cirrhosis of liver: Secondary | ICD-10-CM | POA: Diagnosis not present

## 2024-03-19 HISTORY — PX: IR PARACENTESIS: IMG2679

## 2024-03-19 LAB — GRAM STAIN

## 2024-03-19 LAB — BODY FLUID CELL COUNT WITH DIFFERENTIAL
Eos, Fluid: 0 %
Lymphs, Fluid: 46 %
Monocyte-Macrophage-Serous Fluid: 53 % (ref 50–90)
Neutrophil Count, Fluid: 1 % (ref 0–25)
Total Nucleated Cell Count, Fluid: 111 uL (ref 0–1000)

## 2024-03-19 MED ORDER — LIDOCAINE-EPINEPHRINE 1 %-1:100000 IJ SOLN
INTRAMUSCULAR | Status: AC
  Filled 2024-03-19: qty 1

## 2024-03-19 MED ORDER — ALBUMIN HUMAN 25 % IV SOLN
INTRAVENOUS | Status: AC
  Filled 2024-03-19: qty 200

## 2024-03-19 MED ORDER — SPIRONOLACTONE 100 MG PO TABS: 200.0000 mg | ORAL_TABLET | Freq: Every day | ORAL | 1 refills | Status: DC

## 2024-03-19 MED ORDER — ALBUMIN HUMAN 25 % IV SOLN
50.0000 g | Freq: Once | INTRAVENOUS | Status: AC
  Administered 2024-03-19: 50 g via INTRAVENOUS

## 2024-03-19 MED ORDER — FUROSEMIDE 80 MG PO TABS: 80.0000 mg | ORAL_TABLET | Freq: Every day | ORAL | 1 refills | Status: DC

## 2024-03-19 MED ORDER — LIDOCAINE-EPINEPHRINE 1 %-1:100000 IJ SOLN
20.0000 mL | Freq: Once | INTRAMUSCULAR | Status: AC
  Administered 2024-03-19: 8 mL via INTRADERMAL

## 2024-03-19 NOTE — Progress Notes (Signed)
 HPI : Jeffrey Hodges is a 58 year old male with decompensated alcohol-induced cirrhosis (ascites, hepatic encephalopathy) who presents for follow up after recent hospitalization for hepatic encephalopathy/altered mental status.  He was hospitalized from July 15-24 after being found crawling in the neighbors yard.  On admission his sodium was 129, ammonia 58, tbili 2.4.  UDS positive for benzodiazepines and THC.  His mental status improved over the course of his hospitalization and he was discharged to SNF.  He is back at home now.  He reports fluid accumulation in his abdomen and is scheduled for a paracentesis today. He has been requiring therapeutic paracenteses about every 2-3 weeks, typically with 7 liters removed.  He takes furosemide  three tablets daily but has run out and is awaiting a refill. He is also on spironolactone , which was increased to two tablets daily, and he has been compliant with this regimen. He continues to experience fluid accumulation in the abdomen and swelling in the legs and feet. The fluid buildup causes discomfort and has previously resulted in significant pain.  He has difficulty sleeping at night, staying awake until 5:30 AM, and does not sleep much during the day. He attributes this to his mind being unable to settle down rather than physical discomfort. No recent falls or breathing issues are reported.  He reports that his confusion and behavior have improved over the past month, though some memory issues persist. He has discontinued lactulose  due to excessive bowel movements/incontinence, even with once daily dosing,  but is currently taking rifaximin . No significant issues with thinking clearly or disorientation are reported recently.  His diet has been discussed, with emphasis on reducing salt intake to manage ascites. He acknowledges the difficulty of maintaining a low-sodium diet, especially avoiding processed and restaurant foods.  He reports a good appetite and no  problems with nausea/vomiting/anorexia.  He has a history of excessive alcohol use despite being counseled to stop but has been abstinent since July.   He reports no current use of over-the-counter pain medications. He has experienced bruising and bleeding on his arms, but no overt gastrointestinal bleeding has been reported.        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   IR PARACENTESIS  01/28/2024   IR PARACENTESIS  02/02/2024   IR PARACENTESIS  02/26/2024   LEFT HEART CATH AND CORONARY ANGIOGRAPHY N/A 09/11/2022   Procedure: LEFT HEART CATH AND CORONARY ANGIOGRAPHY;  Surgeon: Wendel Lurena POUR, MD;  Location: [facility];  Service: Cardiovascular;  Laterality: N/A;   Family History  Problem Relation Age of Onset   CAD Neg Hx    CVA Neg Hx    Diabetes Neg Hx    Social History   Tobacco Use   Smoking status: Former    Types: Cigarettes    Passive exposure: Never   Smokeless tobacco: Never  Vaping Use   Vaping status: Every Day   Substances: Nicotine , Flavoring  Substance Use Topics   Alcohol use: Yes   Drug use: Yes  Types: Marijuana   Current Outpatient Medications  Medication Sig Dispense Refill   aspirin  EC 81 MG tablet Take 1 tablet (81 mg total) by mouth daily. Swallow whole. 90 tablet 3   atorvastatin  (LIPITOR) 80 MG tablet Take 1 tablet (80 mg total) by mouth daily. 90 tablet 3   busPIRone  (BUSPAR ) 10 MG tablet Take 1 tablet (10 mg total) by mouth 2 (two) times daily. 60 tablet 2   cyanocobalamin  (VITAMIN B12) 1000  MCG tablet Take 1 tablet (1,000 mcg total) by mouth daily. 90 tablet 1   divalproex  (DEPAKOTE ) 500 MG DR tablet Take 1 tablet (500 mg total) by mouth every 8 (eight) hours.     doxycycline  (VIBRA -TABS) 100 MG tablet Take 1 tablet (100 mg total) by mouth 2 (two) times daily. 20 tablet 0   FLUoxetine  (PROZAC ) 40 MG capsule Take 1 capsule (40 mg total) by mouth daily. 90 capsule 0   furosemide  (LASIX ) 20 MG tablet Take 3 tablets (60 mg total) by mouth daily.     lactulose  (CHRONULAC ) 10 GM/15ML solution Take 45 mLs (30 g total) by mouth daily.     Multiple Vitamin (MULTIVITAMIN WITH MINERALS) TABS tablet Take 1 tablet by mouth daily.     nitroGLYCERIN  (NITROSTAT ) 0.4 MG SL tablet Place 1 tablet (0.4 mg total) under the tongue every 5 (five) minutes x 3 doses as needed for chest pain. 25 tablet 3   pantoprazole  (PROTONIX ) 40 MG tablet Take 1 tablet (40 mg total) by mouth daily.     potassium chloride  (KLOR-CON ) 10 MEQ tablet Take 1 tablet (10 mEq total) by mouth daily. 14 tablet 0   rifaximin  (XIFAXAN ) 550 MG TABS tablet Take 1 tablet (550 mg total) by mouth 2 (two) times daily.     spironolactone  (ALDACTONE ) 50 MG tablet Take 1 tablet (50 mg total) by mouth daily. 90 tablet 0   tamsulosin  (FLOMAX ) 0.4 MG CAPS capsule Take 1 capsule (0.4 mg total) by mouth daily.     thiamine  (VITAMIN B1) 100 MG tablet Take 1 tablet (100 mg total) by mouth daily.     Vitamin D , Ergocalciferol , (DRISDOL ) 1.25 MG (50000 UNIT) CAPS capsule X 24 weeks 24 capsule 0   folic acid  (FOLVITE ) 1 MG tablet Take 1 tablet (1 mg total) by mouth daily. 90 tablet 1   nicotine  (NICODERM CQ  - DOSED IN MG/24 HOURS) 21 mg/24hr patch Place 1 patch (21 mg total) onto the skin daily. (Patient not taking: Reported on 02/27/2024)     No current facility-administered medications for this visit.   No Known Allergies   Review of Systems: All systems reviewed and negative except where noted in HPI.    DG Chest 2 View Result Date:  02/27/2024 CLINICAL DATA:  Cough for 6 weeks. Lower extremity edema. History of ascites. EXAM: CHEST - 2 VIEW COMPARISON:  Radiograph 01/27/2024 FINDINGS: Normal heart size.The cardiomediastinal contours are normal. Minimal ill-defined patchy opacity at the left lung base. Pulmonary vasculature is normal. No significant pleural effusion. No pneumothorax. No acute osseous abnormalities are seen. IMPRESSION: Minimal ill-defined patchy opacity at the left lung base, atelectasis versus pneumonia. Electronically Signed   By: Andrea Gasman M.D.   On: 02/27/2024 16:05   IR Paracentesis Result Date: 02/26/2024 INDICATION: Patient with history of cirrhosis with recurrent ascites. Interventional radiology asked to perform a diagnostic and therapeutic paracentesis with an 8 L max. EXAM: ULTRASOUND GUIDED PARACENTESIS MEDICATIONS: 1% lidocaine  10 mL COMPLICATIONS: None immediate. PROCEDURE: Informed written consent was obtained  from the patient after a discussion of the risks, benefits and alternatives to treatment. A timeout was performed prior to the initiation of the procedure. Initial ultrasound scanning demonstrates a large amount of ascites within the right lower abdominal quadrant. The right lower abdomen was prepped and draped in the usual sterile fashion. 1% lidocaine  was used for local anesthesia. Following this, a 19 gauge, 7-cm, Yueh catheter was introduced. An ultrasound image was saved for documentation purposes. The paracentesis was performed. The catheter was removed and a dressing was applied. The patient tolerated the procedure well without immediate post procedural complication. Patient received post-procedure intravenous albumin ; see nursing notes for details. FINDINGS: A total of approximately 7.5 L of clear yellow fluid was removed. Samples were sent to the laboratory as requested by the clinical team. IMPRESSION: Successful ultrasound-guided paracentesis yielding 7.5 liters of peritoneal fluid.  Procedure performed Warren Dais, NP PLAN: The patient has required >/=2 paracenteses in a 30 day period and a formal evaluation by the [facility] has been arranged. Electronically Signed   By: Ester Sides M.D.   On: 02/26/2024 15:01    Physical Exam: BP 118/78 (BP Location: Left Arm, Patient Position: Sitting, Cuff Size: Normal)   Pulse 75   Ht 6' (1.829 m)   Wt 181 lb 6 oz (82.3 kg)   BMI 24.60 kg/m  Constitutional: Pleasant,well-developed, Caucasian male in no acute distress.  Appears older than stated age.  Accompanied by spouse.  Sarcopenia noted. HEENT: Normocephalic and atraumatic. Conjunctivae are normal. No scleral icterus. Neck supple.  Cardiovascular: Normal rate, regular rhythm.  Pulmonary/chest: Effort normal and breath sounds normal. No wheezing, rales or rhonchi. Abdominal: Soft, nondistended, nontender. Bowel sounds active throughout. There are no masses palpable. No hepatomegaly. Extremities: no edema Lymphadenopathy: No cervical adenopathy noted. Neurological: Alert and oriented to person place and time. Skin: Skin is warm and dry. No rashes noted. Psychiatric: Normal mood and affect. Behavior is normal.  CBC    Component Value Date/Time   WBC 7.6 02/27/2024 1538   RBC 3.74 (L) 02/27/2024 1538   HGB 11.7 (L) 02/27/2024 1538   HCT 36.1 (L) 02/27/2024 1538   PLT 368 02/27/2024 1538   MCV 96.5 02/27/2024 1538   MCH 31.3 02/27/2024 1538   MCHC 32.4 02/27/2024 1538   RDW 15.4 (H) 02/27/2024 1538   LYMPHSABS 1.5 01/27/2024 0842   MONOABS 1.3 (H) 01/27/2024 0842   EOSABS 0.0 01/27/2024 0842   BASOSABS 0.0 01/27/2024 0842    CMP     Component Value Date/Time   NA 133 (L) 03/17/2024 1628   K 3.0 (L) 03/17/2024 1628   CL 93 (L) 03/17/2024 1628   CO2 31 03/17/2024 1628   GLUCOSE 125 (H) 03/17/2024 1628   BUN 5 (L) 03/17/2024 1628   CREATININE 0.77 03/17/2024 1628   CREATININE 0.67 (L) 02/27/2024 1538    CALCIUM  8.0 (L) 03/17/2024 1628   PROT 7.1 03/17/2024 1628   ALBUMIN  2.3 (L) 03/17/2024 1628   AST 27 03/17/2024 1628   ALT 10 03/17/2024 1628   ALKPHOS 143 (H) 03/17/2024 1628   BILITOT 0.9 03/17/2024 1628   GFRNONAA >60 02/04/2024 0519       Latest Ref Rng & Units 02/27/2024    3:38 PM 02/03/2024    6:35 AM 02/02/2024    1:55 PM  CBC EXTENDED  WBC 3.8 - 10.8 Thousand/uL 7.6  6.4  6.6   RBC 4.20 - 5.80 Million/uL 3.74  3.26  3.45  Hemoglobin 13.2 - 17.1 g/dL 88.2  89.4  88.7   HCT 38.5 - 50.0 % 36.1  29.0  31.2   Platelets 140 - 400 Thousand/uL 368  199  206    MELD 3.0: 13 at 03/17/2024  4:28 PM MELD-Na: 8 at 03/17/2024  4:28 PM Calculated from: Serum Creatinine: 0.77 mg/dL (Using min of 1 mg/dL) at 0/12/7972  5:71 PM Serum Sodium: 133 mEq/L at 03/17/2024  4:28 PM Total Bilirubin: 0.9 mg/dL (Using min of 1 mg/dL) at 0/12/7972  5:71 PM Serum Albumin : 2.3 g/dL at 0/12/7972  5:71 PM INR(ratio): 1.2 ratio at 03/17/2024  4:28 PM Age at listing (hypothetical): 57 years Sex: Male at 03/17/2024  4:28 PM     ASSESSMENT AND PLAN:   58 year old male with decompensated alcohol induced cirrhosis with ascites and hepatic encephalopathy.  He reports abstinence from alcohol since July.  Although his MELD score is quite low, I feel his disease is more severe and his prognosis poorer than his MELD score predicts.  He is interested in a transplant evaluation if he is felt to be a candidate.  We discussed how his current length of abstinence and MELD score would not make him a candidate, but that it would be reasonable to refer him to a transplant to further discuss the process and provide further guidance on transplant evaluation and candidacy. - Refer to [facility] to initiate transplant evaluation. - Repeat HCC screening Jan 2026   Ascites Currently with refractory ascites requiring frequent paracentesis.  Not following a low sodium diet currently.  Alcohol cessation since July is crucial for  health improvement. Potential TIPS procedure discussed, but patient's HE may be prohibitive for TIPS. Emphasized low sodium diet to manage ascites and reduce paracentesis need.  History of hypokalemia and hyponatremia. - Increase Lasix  to 80 mg daily. - Increase spironolactone  to 200 mg daily. - Educate on low sodium diet (<2g/day) to manage ascites. - Schedule paracentesis as needed. - Discuss potential TIPS procedure if ascites remains refractory. - Repeat BMP 2 weeks  Peripheral edema secondary to cirrhosis Peripheral edema in legs and feet likely secondary to cirrhosis and fluid retention. Increasing diuretics and dietary sodium restriction are key to managing fluid retention. - Increase Lasix  and spironolactone  as above to manage fluid retention. - Educate on low sodium diet to reduce fluid accumulation.  Hypokalemia Low potassium levels likely due to diuretic use. Spironolactone  increase may help raise potassium levels. Potassium supplementation is also being used. - Increase spironolactone  to 200 mg daily to help manage ascites and raise potassium levels. - Monitor potassium levels closely with repeat labs in two weeks.  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  Patient had other behavioral disturbances which may not have been entirely attributed to HE (bizarre behavior, hallucinations, combativeness) which have resolved.  This may have been a combination of withdrawal, Wernicke's and drug effect (UDS positive for benzodiazepines, THC). - Continue rifaximin  for hepatic encephalopathy. - Continue folate, thiamine   Difficulty sleeping likely related to hepatic encephalopathy with reversal of sleep-wake cycle. Discussed potential use of Benadryl for sleep, with caution due to potential worsening of encephalopathy. - Consider Benadryl for sleep, but monitor for worsening encephalopathy. - Discuss sleep hygiene  practices, including avoiding screen time and caffeine before bed.  Screening for esophageal varices High risk for esophageal varices due to cirrhosis and ascites. No prior EGD performed. Explained EGD procedure and potential need for banding if varices are found. -  Schedule EGD for variceal screening.  Screening for colon cancer Colonoscopy postponed due to hospitalizations. Cologuard is an alternative for colon cancer screening. If Cologuard is positive, a colonoscopy will be necessary. - Order Cologuard for colon cancer screening.  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  No other concerning liver lesions found.  AFP 13 - Repeat RUQUS Jan 2026  Recording duration: 80 minutes     Syrianna Schillaci E. Stacia, MD [facility]    [name], NP

## 2024-03-19 NOTE — Patient Instructions (Signed)
 We have sent the following medications to your pharmacy for you to pick up at your convenience: Lasix  80 mg daily and aldactone  200 mg daily.   Your provider has ordered Cologuard testing as an option for colon cancer screening. This is performed by [facility] [facility] and may be out of network with your insurance. PRIOR to completing the test, it is YOUR responsibility to contact your insurance about covered benefits for this test. Your out of pocket expense could be anywhere from $0.00 to $649.00.   When you call to check coverage with your insurer, please provide the following information:   -The ONLY provider of Cologuard is [facility], dispensing  - CPT code for Cologuard is [phone].  [facility] NPI # [id number]  -[employer] Tax ID # [id number]   We have already sent your demographic and insurance information to [employer] (phone number [phone]) and they should contact you within the next week regarding your test. If you have not heard from them within the next week, please call our office at [phone].  Your provider has requested that you go to the basement level for lab work on 04/02/24. Press B on the elevator. The lab is located at the first door on the left as you exit the elevator.  We are referring you to [facility].   _______________________________________________________  If your blood pressure at your visit was 140/90 or greater, please contact your primary care physician to follow up on this.  _______________________________________________________  If you are age 56 or older, your body mass index should be between 23-30. Your Body mass index is 24.6 kg/m. If this is out of the aforementioned range listed, please consider follow up with your Primary Care Provider.  If you are age 62 or younger, your body mass index should be between 19-25. Your Body mass index is 24.6 kg/m. If this is out of the  aformentioned range listed, please consider follow up with your Primary Care Provider.   ________________________________________________________  The [facility] providers would like to encourage you to use MYCHART to communicate with providers for non-urgent requests or questions.  Due to long hold times on the telephone, sending your provider a message by [facility] may be a faster and more efficient way to get a response.  Please allow 48 business hours for a response.  Please remember that this is for non-urgent requests.  _______________________________________________________  [facility] is using a team-based approach to care.  Your team is made up of your doctor and two to three APPS. Our APPS (Nurse Practitioners and Physician Assistants) work with your physician to ensure care continuity for you. They are fully qualified to address your health concerns and develop a treatment plan. They communicate directly with your gastroenterologist to care for you. Seeing the Advanced Practice Practitioners on your physician's team can help you by facilitating care more promptly, often allowing for earlier appointments, access to diagnostic testing, procedures, and other specialty referrals.

## 2024-03-19 NOTE — Procedures (Signed)
 PROCEDURE SUMMARY:  Successful US  guided paracentesis from right lateral abdomen.  Yielded 7.2 liters of clear, yellow fluid.  No immediate complications.  Pt tolerated well.   Specimen was sent for labs.  EBL < 5mL  Solmon Sara Travanti Mcmanus PA-C 03/19/2024 1:25 PM

## 2024-03-22 DIAGNOSIS — K7031 Alcoholic cirrhosis of liver with ascites: Secondary | ICD-10-CM | POA: Insufficient documentation

## 2024-03-24 ENCOUNTER — Encounter: Payer: Self-pay | Admitting: Gastroenterology

## 2024-03-24 ENCOUNTER — Telehealth: Payer: Self-pay

## 2024-03-24 LAB — CULTURE, BODY FLUID W GRAM STAIN -BOTTLE: Culture: NO GROWTH

## 2024-03-24 NOTE — Telephone Encounter (Signed)
 Patient has an appointment with [facility] on 06-24-24 at 1:45 pm.

## 2024-03-25 NOTE — Telephone Encounter (Signed)
 Dr Stacia-  I was able to find the note about thickened liquids in 02/05/24 discharge summary after wife told me hospital gave them this recommendation. I have looked through all progress notes from that hospital stay and still see no mention of any swallowing difficulties or evaluations, so possibly a pre-populated portion of dicharge note?   At any rate, patient is advised he may have thin/regular liquids as long as tolerating these.    _______________________________________________________________________________________________________________  Christobal Guadalajara, MD Physician Hospitalist   Discharge Summary    Signed   Date of Service: 02/05/2024  9:37 AM   Signed     Expand All Collapse All  Physician Discharge Summary  Jeffrey Hodges FMW:[id number] DOB: [birth year] DOA: 01/27/2024   PCP: [name], NP   Admit date: 01/27/2024 Discharge date: 02/05/2024 Recommendations for Outpatient Follow-up:  Follow up with PCP in 1 weeks-call for appointment Please obtain BMP/CBC in one week   Discharge Dispo: SNF Discharge Condition: Stable Code Status:   Code Status: Limited: Do not attempt resuscitation (DNR) -DNR-LIMITED -Do Not Intubate/DNI  Diet recommendation:  Diet Order                  DIET DYS 3 Room service appropriate? Yes with Assist; Fluid consistency: Nectar Thick  Diet effective now

## 2024-04-02 ENCOUNTER — Telehealth: Payer: Self-pay | Admitting: Gastroenterology

## 2024-04-02 ENCOUNTER — Inpatient Hospital Stay: Admitting: Nurse Practitioner

## 2024-04-02 DIAGNOSIS — K7031 Alcoholic cirrhosis of liver with ascites: Secondary | ICD-10-CM

## 2024-04-02 NOTE — Telephone Encounter (Signed)
 Dr Stacia-  Patient had paracentesis 03/19/24 yielding 7.2 L fluid. Your last office note 9/5 indicates patient may have repeat paracentesis as needed.   Wife is calling today with request for paracentesis next week. Do you have preference as max amount of fluid drawn off? Any labs that need to be done on the fluid?  Do you want this to be a standing request?

## 2024-04-02 NOTE — Telephone Encounter (Signed)
 Inbound call from patient's wife requesting a call to discuss scheduling paracentesis for next week. Requesting a call back to discuss further. Please advise, thank you

## 2024-04-06 ENCOUNTER — Encounter: Payer: Self-pay | Admitting: Gastroenterology

## 2024-04-06 ENCOUNTER — Telehealth: Payer: Self-pay | Admitting: Gastroenterology

## 2024-04-06 ENCOUNTER — Ambulatory Visit: Admitting: Nurse Practitioner

## 2024-04-06 MED ORDER — RIFAXIMIN 550 MG PO TABS: 550.0000 mg | ORAL_TABLET | Freq: Two times a day (BID) | ORAL | 3 refills | Status: DC

## 2024-04-06 NOTE — Telephone Encounter (Signed)
 Inbound call from patient wife requesting to see if Dr. Stacia can prescribe her husband Xifaxan  550 MG Tabs Tablets due to her husband running out of that medication. Patient wife is requesting a call back. Please advise.

## 2024-04-06 NOTE — Telephone Encounter (Signed)
 Refill sent to  pharmacy as requested, pts wife aware.

## 2024-04-06 NOTE — Telephone Encounter (Signed)
 Patient has been scheduled for paracentesis at [facility] on tomorrow, 04/07/24 at 10 am, 930 am arrival. I have spoken to patient's wife, [name] to advise of this information and she verbalizes understanding.

## 2024-04-06 NOTE — Telephone Encounter (Signed)
 SABRA

## 2024-04-07 ENCOUNTER — Ambulatory Visit (HOSPITAL_COMMUNITY)
Admission: RE | Admit: 2024-04-07 | Discharge: 2024-04-07 | Disposition: A | Discharge status: Home / Self Care | Source: Ambulatory Visit | Attending: Gastroenterology | Admitting: Gastroenterology

## 2024-04-07 DIAGNOSIS — K7031 Alcoholic cirrhosis of liver with ascites: Secondary | ICD-10-CM | POA: Diagnosis not present

## 2024-04-07 DIAGNOSIS — K7460 Unspecified cirrhosis of liver: Secondary | ICD-10-CM | POA: Diagnosis not present

## 2024-04-07 HISTORY — PX: IR PARACENTESIS: IMG2679

## 2024-04-07 LAB — BODY FLUID CELL COUNT WITH DIFFERENTIAL
Eos, Fluid: 0 %
Lymphs, Fluid: 53 %
Monocyte-Macrophage-Serous Fluid: 46 % — ABNORMAL LOW (ref 50–90)
Neutrophil Count, Fluid: 1 % (ref 0–25)
Total Nucleated Cell Count, Fluid: 63 uL (ref 0–1000)

## 2024-04-07 MED ORDER — LIDOCAINE-EPINEPHRINE 1 %-1:100000 IJ SOLN
INTRAMUSCULAR | Status: AC
  Filled 2024-04-07: qty 1

## 2024-04-07 MED ORDER — ALBUMIN HUMAN 25 % IV SOLN
INTRAVENOUS | Status: AC
  Filled 2024-04-07: qty 200

## 2024-04-07 MED ORDER — ALBUMIN HUMAN 25 % IV SOLN
50.0000 g | Freq: Once | INTRAVENOUS | Status: AC
Start: 2024-04-07 — End: 2024-04-07
  Administered 2024-04-07: 50 g via INTRAVENOUS

## 2024-04-07 MED ORDER — LIDOCAINE-EPINEPHRINE 2 %-1:100000 IJ SOLN
20.0000 mL | Freq: Once | INTRAMUSCULAR | Status: DC
Start: 2024-04-07 — End: 2024-04-07

## 2024-04-07 NOTE — Procedures (Signed)
 PROCEDURE SUMMARY:  Successful ultrasound guided paracentesis from the right lower quadrant.  Yielded 8.0 L of clear yellow fluid.  No immediate complications.  The patient tolerated the procedure well.   Specimen was sent for labs.  EBL < 5mL  The patient has required >/=2 paracenteses in a 30 day period and a screening evaluation by the [facility] has been arranged. Sent to Dr. Jennefer for review 04/07/24  Clotilda Hesselbach, PA-C

## 2024-04-08 LAB — PATHOLOGIST SMEAR REVIEW

## 2024-04-10 LAB — BODY FLUID CULTURE W GRAM STAIN: Culture: NO GROWTH

## 2024-04-13 ENCOUNTER — Ambulatory Visit: Admitting: Gastroenterology

## 2024-04-13 VITALS — BP 105/79 | HR 84 | Temp 97.5°F | Ht 72.0 in | Wt 181.0 lb

## 2024-04-13 MED ORDER — SODIUM CHLORIDE 0.9 % IV SOLN: 500.0000 mL | Freq: Once | INTRAVENOUS | Status: AC

## 2024-04-13 NOTE — Progress Notes (Unsigned)
 Called to room to assist during endoscopic procedure.  Patient ID and intended procedure confirmed with present staff. Received instructions for my participation in the procedure from the performing physician.

## 2024-04-13 NOTE — Progress Notes (Signed)
 Patient cancelled due to ate food around 12 oclock the day of the procedure.

## 2024-04-20 ENCOUNTER — Other Ambulatory Visit: Payer: Self-pay | Admitting: Gastroenterology

## 2024-05-12 ENCOUNTER — Encounter: Payer: Self-pay | Admitting: Gastroenterology

## 2024-05-16 ENCOUNTER — Other Ambulatory Visit: Payer: Self-pay | Admitting: Gastroenterology

## 2024-05-19 ENCOUNTER — Other Ambulatory Visit: Payer: Self-pay | Admitting: Gastroenterology

## 2024-05-21 ENCOUNTER — Encounter: Payer: Self-pay | Admitting: Gastroenterology

## 2024-05-21 ENCOUNTER — Ambulatory Visit (AMBULATORY_SURGERY_CENTER): Admitting: Gastroenterology

## 2024-05-21 VITALS — BP 105/68 | HR 64 | Temp 98.0°F | Resp 15 | Ht 72.0 in | Wt 181.6 lb

## 2024-05-21 DIAGNOSIS — K222 Esophageal obstruction: Secondary | ICD-10-CM

## 2024-05-21 DIAGNOSIS — E876 Hypokalemia: Secondary | ICD-10-CM

## 2024-05-21 DIAGNOSIS — K2951 Unspecified chronic gastritis with bleeding: Secondary | ICD-10-CM | POA: Diagnosis not present

## 2024-05-21 DIAGNOSIS — K7460 Unspecified cirrhosis of liver: Secondary | ICD-10-CM

## 2024-05-21 DIAGNOSIS — K2970 Gastritis, unspecified, without bleeding: Secondary | ICD-10-CM

## 2024-05-21 DIAGNOSIS — K449 Diaphragmatic hernia without obstruction or gangrene: Secondary | ICD-10-CM

## 2024-05-21 DIAGNOSIS — K7031 Alcoholic cirrhosis of liver with ascites: Secondary | ICD-10-CM

## 2024-05-21 MED ORDER — SODIUM CHLORIDE 0.9 % IV SOLN: 500.0000 mL | Freq: Once | INTRAVENOUS | Status: DC

## 2024-05-21 NOTE — Progress Notes (Signed)
 Sedate, gd SR, tolerated procedure well, VSS, report to RN

## 2024-05-21 NOTE — Op Note (Addendum)
 [facility] Patient Name: Jeffrey Hodges Procedure Date: 05/21/2024 9:51 AM MRN: [id number] Endoscopist: Glendia E. Stacia , MD, [id number] Age: 58 Referring MD:  Date of Birth: 1966/05/03 Gender: Male Account #: [id number] Procedure:                Upper GI endoscopy Indications:              Cirrhosis rule out esophageal varices Medicines:                Monitored Anesthesia Care Procedure:                Pre-Anesthesia Assessment:                           - Prior to the procedure, a History and Physical                            was performed, and patient medications and                            allergies were reviewed. The patient's tolerance of                            previous anesthesia was also reviewed. The risks                            and benefits of the procedure and the sedation                            options and risks were discussed with the patient.                            All questions were answered, and informed consent                            was obtained. Prior Anticoagulants: The patient has                            taken no anticoagulant or antiplatelet agents. ASA                            Grade Assessment: III - A patient with severe                            systemic disease. After reviewing the risks and                            benefits, the patient was deemed in satisfactory                            condition to undergo the procedure.                           After obtaining informed consent, the endoscope was  passed under direct vision. Throughout the                            procedure, the patient's blood pressure, pulse, and                            oxygen saturations were monitored continuously. The                            GIF [id number] #[id number] was introduced through the                            mouth, and advanced to the second part of duodenum.                            The upper  GI endoscopy was accomplished without                            difficulty. The patient tolerated the procedure                            well. Scope In: Scope Out: Findings:                 The examined portions of the nasopharynx,                            oropharynx and larynx were normal.                           One benign-appearing, intrinsic mild stenosis was                            found at the gastroesophageal junction. This                            stenosis measured 1.3 cm (inner diameter) x less                            than one cm (in length). The stenosis was traversed.                           The gastroesophageal flap valve was visualized                            endoscopically and classified as Hill Grade IV (no                            fold, wide open lumen, hiatal hernia present).                           The exam of the esophagus was otherwise normal.                           A 4 cm hiatal hernia  was present.                           Diffuse mildly erythematous mucosa was found in the                            gastric body. Biopsies were taken with a cold                            forceps for Helicobacter pylori testing. Estimated                            blood loss was minimal.                           The examined duodenum was normal. Complications:            No immediate complications. Estimated Blood Loss:     Estimated blood loss was minimal. Impression:               - The examined portions of the nasopharynx,                            oropharynx and larynx were normal.                           - Benign-appearing esophageal stenosis. Not dilated                            given absence of dysphagia                           - Gastroesophageal flap valve classified as Hill                            Grade IV (no fold, wide open lumen, hiatal hernia                            present).                           - 4 cm hiatal hernia.                            - Erythematous mucosa in the gastric body. Biopsied.                           - Normal examined duodenum. Recommendation:           - Patient has a contact number available for                            emergencies. The signs and symptoms of potential                            delayed complications were discussed with the  patient. Return to normal activities tomorrow.                            Written discharge instructions were provided to the                            patient.                           - Resume previous diet.                           - Continue present medications.                           - Await pathology results.                           - Repeat upper endoscopy in 2 years for screening                            purposes. Illana Nolting E. Stacia, MD 05/21/2024 10:19:48 AM This report has been signed electronically.

## 2024-05-21 NOTE — Progress Notes (Signed)
 [facility] History and Physical   Primary Care Physician:  [name], NP   Reason for Procedure:   Screening for esophageal varices  Plan:    EGD     HPI: Jeffrey Hodges is a 58 y.o. 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  He has no family history of colon cancer and no chronic GI symptoms.    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 Authorizing Provider  aspirin  EC 81 MG tablet Take 1 tablet (81 mg total) by mouth daily. Swallow whole. 09/20/22  Yes Monge, Damien BROCKS, NP  atorvastatin  (LIPITOR) 80 MG tablet Take 1 tablet (80 mg total) by mouth daily. 09/20/22  Yes Monge, Damien BROCKS, NP  cyanocobalamin  (VITAMIN B12) 1000 MCG tablet Take 1 tablet (1,000 mcg total) by mouth daily. 10/27/23  Yes Stacia Glendia BRAVO, MD  folic acid  (FOLVITE ) 1 MG tablet TAKE 1 TABLET BY MOUTH EVERY DAY 04/20/24  Yes Stacia Glendia BRAVO, MD  tamsulosin  (FLOMAX ) 0.4 MG CAPS capsule Take 1 capsule (0.4 mg total) by mouth daily. 01/06/24  Yes Dennise Lavada POUR, MD  amLODipine  (NORVASC ) 5 MG  tablet Oral Patient not taking: Reported on 05/21/2024 08/22/22   [provider]  busPIRone  (BUSPAR ) 10 MG tablet Take 1 tablet (10 mg total) by mouth 2 (two) times daily. 01/21/24   [name], NP  divalproex  (DEPAKOTE ) 500 MG DR tablet Take 1 tablet (500 mg total) by mouth every 8 (eight) hours. 02/05/24   Christobal Guadalajara, MD  doxycycline  (VIBRA -TABS) 100 MG tablet Take 1 tablet (100 mg total) by mouth 2 (two) times daily. Patient not taking: Reported on 05/21/2024 02/27/24   Webb, Padonda B, FNP  FLUoxetine  (PROZAC ) 40 MG capsule Take 1 capsule (40 mg total) by mouth daily. Patient not taking: Reported on 05/21/2024 01/21/24   [name], NP  furosemide  (LASIX ) 80 MG tablet TAKE 1 TABLET BY MOUTH EVERY DAY Patient not taking: Reported on 05/21/2024 05/17/24   Stacia Glendia BRAVO, MD  lactulose  (CHRONULAC ) 10 GM/15ML solution Take 45 mLs (30 g total) by mouth daily. 02/05/24   Christobal Guadalajara, MD  Multiple Vitamin (MULTIVITAMIN WITH MINERALS) TABS tablet Take 1 tablet by mouth daily. 02/05/24   Christobal Guadalajara, MD  nicotine  (NICODERM CQ  - DOSED IN MG/24 HOURS) 21 mg/24hr patch Place 1 patch (21 mg total) onto the skin daily. Patient not taking: Reported on 02/27/2024 01/06/24   Singh, Prashant K, MD  nitroGLYCERIN  (NITROSTAT ) 0.4 MG SL tablet Place 1 tablet (0.4 mg  total) under the tongue every 5 (five) minutes x 3 doses as needed for chest pain. 09/20/22   Daneen Damien BROCKS, NP  pantoprazole  (PROTONIX ) 40 MG tablet Take 1 tablet (40 mg total) by mouth daily. 01/06/24   Singh, Prashant K, MD  potassium chloride  (KLOR-CON ) 10 MEQ tablet Take 1 tablet (10 mEq total) by mouth daily. 01/23/24   Suzette Pac, MD  rifaximin  (XIFAXAN ) 550 MG TABS tablet Take 1 tablet (550 mg total) by mouth 2 (two) times daily. 02/05/24   Christobal Guadalajara, MD  rifaximin  (XIFAXAN ) 550 MG TABS tablet Take 1 tablet (550 mg total) by mouth 2 (two) times daily. 04/06/24   Stacia Glendia BRAVO, MD  spironolactone  (ALDACTONE ) 100 MG tablet TAKE 2  TABLETS BY MOUTH EVERY DAY Patient not taking: Reported on 05/21/2024 05/19/24   Stacia Glendia BRAVO, MD  thiamine  (VITAMIN B1) 100 MG tablet Take 1 tablet (100 mg total) by mouth daily. 01/06/24   Dennise Lavada POUR, MD  Vitamin D , Ergocalciferol , (DRISDOL ) 1.25 MG (50000 UNIT) CAPS capsule X 24 weeks 10/27/23   Stacia Glendia BRAVO, MD    Current Outpatient Medications  Medication Sig Dispense Refill   aspirin  EC 81 MG tablet Take 1 tablet (81 mg total) by mouth daily. Swallow whole. 90 tablet 3   atorvastatin  (LIPITOR) 80 MG tablet Take 1 tablet (80 mg total) by mouth daily. 90 tablet 3   cyanocobalamin  (VITAMIN B12) 1000 MCG tablet Take 1 tablet (1,000 mcg total) by mouth daily. 90 tablet 1   folic acid  (FOLVITE ) 1 MG tablet TAKE 1 TABLET BY MOUTH EVERY DAY 90 tablet 1   tamsulosin  (FLOMAX ) 0.4 MG CAPS capsule Take 1 capsule (0.4 mg total) by mouth daily.     amLODipine  (NORVASC ) 5 MG tablet Oral (Patient not taking: Reported on 05/21/2024)     busPIRone  (BUSPAR ) 10 MG tablet Take 1 tablet (10 mg total) by mouth 2 (two) times daily. 60 tablet 2   divalproex  (DEPAKOTE ) 500 MG DR tablet Take 1 tablet (500 mg total) by mouth every 8 (eight) hours.     doxycycline  (VIBRA -TABS) 100 MG tablet Take 1 tablet (100 mg total) by mouth 2 (two) times daily. (Patient not taking: Reported on 05/21/2024) 20 tablet 0   FLUoxetine  (PROZAC ) 40 MG capsule Take 1 capsule (40 mg total) by mouth daily. (Patient not taking: Reported on 05/21/2024) 90 capsule 0   furosemide  (LASIX ) 80 MG tablet TAKE 1 TABLET BY MOUTH EVERY DAY (Patient not taking: Reported on 05/21/2024) 30 tablet 1   lactulose  (CHRONULAC ) 10 GM/15ML solution Take 45 mLs (30 g total) by mouth daily.     Multiple Vitamin (MULTIVITAMIN WITH MINERALS) TABS tablet Take 1 tablet by mouth daily.     nicotine  (NICODERM CQ  - DOSED IN MG/24 HOURS) 21 mg/24hr patch Place 1 patch (21 mg total) onto the skin daily. (Patient not taking: Reported on 02/27/2024)      nitroGLYCERIN  (NITROSTAT ) 0.4 MG SL tablet Place 1 tablet (0.4 mg total) under the tongue every 5 (five) minutes x 3 doses as needed for chest pain. 25 tablet 3   pantoprazole  (PROTONIX ) 40 MG tablet Take 1 tablet (40 mg total) by mouth daily.     potassium chloride  (KLOR-CON ) 10 MEQ tablet Take 1 tablet (10 mEq total) by mouth daily. 14 tablet 0   rifaximin  (XIFAXAN ) 550 MG TABS tablet Take 1 tablet (550 mg total) by mouth 2 (two) times daily.     rifaximin  (XIFAXAN ) 550 MG TABS tablet  Take 1 tablet (550 mg total) by mouth 2 (two) times daily. 60 tablet 3   spironolactone  (ALDACTONE ) 100 MG tablet TAKE 2 TABLETS BY MOUTH EVERY DAY (Patient not taking: Reported on 05/21/2024) 60 tablet 1   thiamine  (VITAMIN B1) 100 MG tablet Take 1 tablet (100 mg total) by mouth daily.     Vitamin D , Ergocalciferol , (DRISDOL ) 1.25 MG (50000 UNIT) CAPS capsule X 24 weeks 24 capsule 0   Current Facility-Administered Medications  Medication Dose Route Frequency Provider Last Rate Last Admin   0.9 %  sodium chloride  infusion  500 mL Intravenous Once Stacia Hamilton E, MD       0.9 %  sodium chloride  infusion  500 mL Intravenous Once Stacia Hamilton BRAVO, MD        Allergies as of 05/21/2024   (No Known Allergies)    Family History  Problem Relation Age of Onset   CAD Neg Hx    CVA Neg Hx    Diabetes Neg Hx    Colon cancer Neg Hx    Esophageal cancer Neg Hx    Stomach cancer Neg Hx    Rectal cancer Neg Hx     Social History   Socioeconomic History   Marital status: Married    Spouse name: Not on file   Number of children: 4   Years of education: Not on file   Highest education level: GED or equivalent  Occupational History   Not on file  Tobacco Use   Smoking status: Some Days    Current packs/day: 1.00    Types: Cigarettes    Passive exposure: Never   Smokeless tobacco: Never  Vaping Use   Vaping status: Every Day   Substances: Nicotine , Flavoring  Substance and Sexual Activity   Alcohol  use: Yes   Drug use: Yes    Types: Marijuana   Sexual activity: Yes  Other Topics Concern   Not on file  Social History Narrative   Fulltime: LIBRARIAN, ACADEMIC      Hobbies: like to play guitars   Social Drivers of Health   Financial Resource Strain: Medium Risk (03/24/2023)   Overall Financial Resource Strain (CARDIA)    Difficulty of Paying Living Expenses: Somewhat hard  Food Insecurity: Patient Unable To Answer (02/02/2024)   Hunger Vital Sign    Worried About Running Out of Food in the Last Year: Patient unable to answer    Ran Out of Food in the Last Year: Patient unable to answer  Transportation Needs: No Transportation Needs (12/26/2023)   PRAPARE - Administrator, Civil Service (Medical): No    Lack of Transportation (Non-Medical): No  Physical Activity: Not on file  Stress: Stress Concern Present (03/24/2023)   [employer] of Occupational Health - Occupational Stress Questionnaire    Feeling of Stress : Very much  Social Connections: Unknown (03/24/2023)   Social Connection and Isolation Panel    Frequency of Communication with Friends and Family: Not on file    Frequency of Social Gatherings with Friends and Family: Never    Attends Religious Services: Never    Database Administrator or Organizations: No    Attends Engineer, Structural: Not on file    Marital Status: Married  Catering Manager Violence: Not At Risk (12/26/2023)   Humiliation, Afraid, Rape, and Kick questionnaire    Fear of Current or Ex-Partner: No    Emotionally Abused: No    Physically Abused: No    Sexually  Abused: No    Review of Systems:  All other review of systems negative except as mentioned in the HPI.  Physical Exam: Vital signs BP 101/67   Pulse 78   Temp 98 F (36.7 C) (Skin)   Ht 6' (1.829 m)   Wt 181 lb 9.6 oz (82.4 kg)   SpO2 98%   BMI 24.63 kg/m   General:   Alert,  Well-developed, well-nourished, pleasant and cooperative in NAD Airway:  Mallampati  2 Lungs:  Clear throughout to auscultation.   Heart:  Regular rate and rhythm; no murmurs, clicks, rubs,  or gallops. Abdomen:  Soft, nontender and nondistended. Normal bowel sounds.   Neuro/Psych:  Normal mood and affect. A and O x 3   Shefali Ng E. Stacia, MD [facility]

## 2024-05-21 NOTE — Patient Instructions (Addendum)
 -Await pathology results -Handout on hiatal hernia provided  YOU HAD AN ENDOSCOPIC PROCEDURE TODAY AT THE [facility]:   Refer to the procedure report that was given to you for any specific questions about what was found during the examination.  If the procedure report does not answer your questions, please call your gastroenterologist to clarify.  If you requested that your care partner not be given the details of your procedure findings, then the procedure report has been included in a sealed envelope for you to review at your convenience later.  YOU SHOULD EXPECT: Some feelings of bloating in the abdomen. Passage of more gas than usual.  Walking can help get rid of the air that was put into your GI tract during the procedure and reduce the bloating. If you had a lower endoscopy (such as a colonoscopy or flexible sigmoidoscopy) you may notice spotting of blood in your stool or on the toilet paper. If you underwent a bowel prep for your procedure, you may not have a normal bowel movement for a few days.  Please Note:  You might notice some irritation and congestion in your nose or some drainage.  This is from the oxygen used during your procedure.  There is no need for concern and it should clear up in a day or so.  SYMPTOMS TO REPORT IMMEDIATELY:  Following upper endoscopy (EGD)  Vomiting of blood or coffee ground material  New chest pain or pain under the shoulder blades  Painful or persistently difficult swallowing  New shortness of breath  Fever of 100F or higher  Black, tarry-looking stools  For urgent or emergent issues, a gastroenterologist can be reached at any hour by calling [phone]. Do not use MyChart messaging for urgent concerns.    DIET:  We do recommend a small meal at first, but then you may proceed to your regular diet.  Drink plenty of fluids but you should avoid alcoholic beverages for 24 hours.  ACTIVITY:  You should plan to take it easy for the rest  of today and you should NOT DRIVE or use heavy machinery until tomorrow (because of the sedation medicines used during the test).    FOLLOW UP: Our staff will call the number listed on your records the next business day following your procedure.  We will call around 7:15- 8:00 am to check on you and address any questions or concerns that you may have regarding the information given to you following your procedure. If we do not reach you, we will leave a message.     If any biopsies were taken you will be contacted by phone or by letter within the next 1-3 weeks.  Please call us  at [phone] if you have not heard about the biopsies in 3 weeks.    SIGNATURES/CONFIDENTIALITY: You and/or your care partner have signed paperwork which will be entered into your electronic medical record.  These signatures attest to the fact that that the information above on your After Visit Summary has been reviewed and is understood.  Full responsibility of the confidentiality of this discharge information lies with you and/or your care-partner.   Hiatal Hernia  A hiatal hernia occurs when part of the stomach slides above the muscle that separates the abdomen from the chest (diaphragm). A person can be born with a hiatal hernia (congenital), or it may develop over time. In almost all cases of hiatal hernia, only the top part of the stomach pushes through the diaphragm. Many  people have a hiatal hernia with no symptoms. The larger the hernia, the more likely it is that you will have symptoms. In some cases, a hiatal hernia allows stomach acid to flow back into the tube that carries food from your mouth to your stomach (esophagus). This may cause heartburn symptoms. The development of heartburn symptoms may mean that you have a condition called gastroesophageal reflux disease (GERD). What are the causes? This condition is caused by a weakness in the opening (hiatus) where the esophagus passes through the diaphragm to  attach to the upper part of the stomach. A person may be born with a weakness in the hiatus, or a weakness can develop over time. What increases the risk? This condition is more likely to develop in: Older people. Age is a major risk factor for a hiatal hernia, especially if you are over the age of 47. Pregnant women. People who are overweight. People who have frequent constipation. What are the signs or symptoms? Symptoms of this condition usually develop in the form of GERD symptoms. Symptoms include: Heartburn. Upset stomach (indigestion). Trouble swallowing. Coughing or wheezing. Wheezing is making high-pitched whistling sounds when you breathe. Sore throat. Chest pain. Nausea and vomiting. How is this diagnosed? This condition may be diagnosed during testing for GERD. Tests that may be done include: X-rays of your stomach or chest. An upper gastrointestinal (GI) series. This is an X-ray exam of your GI tract that is taken after you swallow a chalky liquid that shows up clearly on the X-ray. Endoscopy. This is a procedure to look into your stomach using a thin, flexible tube that has a tiny camera and light on the end of it. How is this treated? This condition may be treated by: Dietary and lifestyle changes to help reduce GERD symptoms. Medicines. These may include: Over-the-counter antacids. Medicines that make your stomach empty more quickly. Medicines that block the production of stomach acid (H2 blockers). Stronger medicines to reduce stomach acid (proton pump inhibitors). Surgery to repair the hernia, if other treatments are not helping. If you have no symptoms, you may not need treatment. Follow these instructions at home: Lifestyle and activity Do not use any products that contain nicotine  or tobacco. These products include cigarettes, chewing tobacco, and vaping devices, such as e-cigarettes. If you need help quitting, ask your health care provider. Try to achieve and  maintain a healthy body weight. Avoid putting pressure on your abdomen. Anything that puts pressure on your abdomen increases the amount of acid that may be pushed up into your esophagus. Avoid bending over, especially after eating. Raise the head of your bed by putting blocks under the legs. This keeps your head and esophagus higher than your stomach. Do not wear tight clothing around your chest or stomach. Try not to strain when having a bowel movement, when urinating, or when lifting heavy objects. Eating and drinking Avoid foods that can worsen GERD symptoms. These may include: Fatty foods, like fried foods. Citrus fruits, like oranges or lemon. Other foods and drinks that contain acid, like orange juice or tomatoes. Spicy food. Chocolate. Eat frequent small meals instead of three large meals a day. This helps prevent your stomach from getting too full. Eat slowly. Do not lie down right after eating. Do not eat 1-2 hours before bed. Do not drink beverages with caffeine. These include cola, coffee, cocoa, and tea. Do not drink alcohol. General instructions Take over-the-counter and prescription medicines only as told by your health care provider.  Keep all follow-up visits. Your health care provider will want to check that any new prescribed medicines are helping your symptoms. Contact a health care provider if: Your symptoms are not controlled with medicines or lifestyle changes. You are having trouble swallowing. You have coughing or wheezing that will not go away. Your pain is getting worse. Your pain spreads to your arms, neck, jaw, teeth, or back. You feel nauseous or you vomit. Get help right away if: You have shortness of breath. You vomit blood. You have bright red blood in your stools. You have black, tarry stools. These symptoms may be an emergency. Get help right away. Call 911. Do not wait to see if the symptoms will go away. Do not drive yourself to the  hospital. Summary A hiatal hernia occurs when part of the stomach slides above the muscle that separates the abdomen from the chest. A person may be born with a weakness in the hiatus, or a weakness can develop over time. Symptoms of a hiatal hernia may include heartburn, trouble swallowing, or sore throat. Management of a hiatal hernia includes eating frequent small meals instead of three large meals a day. Get help right away if you vomit blood, have bright red blood in your stools, or have black, tarry stools. This information is not intended to replace advice given to you by your health care provider. Make sure you discuss any questions you have with your health care provider. Document Revised: 08/28/2021 Document Reviewed: 08/28/2021 Elsevier Patient Education  2024 [employer].

## 2024-05-24 ENCOUNTER — Telehealth: Payer: Self-pay

## 2024-05-24 NOTE — Telephone Encounter (Signed)
  Follow up Call-     05/21/2024    9:12 AM  Call back number  Post procedure Call Back phone  # [phone]  Permission to leave phone message Yes     Patient questions:  Do you have a fever, pain , or abdominal swelling? No. Pain Score  0 *  Have you tolerated food without any problems? Yes.    Have you been able to return to your normal activities? Yes.    Do you have any questions about your discharge instructions: Diet   No. Medications  No. Follow up visit  No.  Do you have questions or concerns about your Care? No.  Actions: * If pain score is 4 or above: No action needed, pain <4.

## 2024-05-25 LAB — SURGICAL PATHOLOGY

## 2024-05-28 ENCOUNTER — Ambulatory Visit: Payer: Self-pay | Admitting: Gastroenterology

## 2024-05-28 NOTE — Progress Notes (Signed)
 Jeffrey Hodges,  The biopsies taken from your stomach were notable for mild chronic gastritis (inflammation) which is a common finding, but there was no evidence of Helicobacter pylori infection. This common finding is not likely to cause any problems or symptoms and there is no specific treatment or further evaluation recommended.

## 2024-05-29 ENCOUNTER — Encounter: Payer: Self-pay | Admitting: Gastroenterology

## 2024-06-24 ENCOUNTER — Other Ambulatory Visit: Payer: Self-pay | Admitting: Gastroenterology

## 2024-06-25 ENCOUNTER — Encounter: Payer: Self-pay | Admitting: Nurse Practitioner

## 2024-06-25 DIAGNOSIS — K7031 Alcoholic cirrhosis of liver with ascites: Secondary | ICD-10-CM

## 2024-07-22 ENCOUNTER — Other Ambulatory Visit: Payer: Self-pay | Admitting: Nurse Practitioner

## 2024-07-23 ENCOUNTER — Other Ambulatory Visit: Payer: Self-pay | Admitting: Gastroenterology

## 2024-07-29 ENCOUNTER — Other Ambulatory Visit: Payer: Self-pay | Admitting: Gastroenterology

## 2024-08-09 ENCOUNTER — Other Ambulatory Visit

## 2024-08-12 ENCOUNTER — Other Ambulatory Visit: Payer: Self-pay | Admitting: Nurse Practitioner

## 2024-08-13 ENCOUNTER — Ambulatory Visit
Admission: RE | Admit: 2024-08-13 | Discharge: 2024-08-13 | Disposition: A | Source: Ambulatory Visit | Attending: Gastroenterology

## 2024-08-13 DIAGNOSIS — R932 Abnormal findings on diagnostic imaging of liver and biliary tract: Secondary | ICD-10-CM

## 2024-08-13 DIAGNOSIS — K709 Alcoholic liver disease, unspecified: Secondary | ICD-10-CM

## 2024-08-13 MED ORDER — GADOPICLENOL 0.5 MMOL/ML IV SOLN
10.0000 mL | Freq: Once | INTRAVENOUS | Status: AC | PRN
  Administered 2024-08-13: 10 mL via INTRAVENOUS

## 2024-08-14 ENCOUNTER — Encounter: Payer: Self-pay | Admitting: Nurse Practitioner

## 2024-08-18 ENCOUNTER — Ambulatory Visit: Payer: Self-pay | Admitting: Gastroenterology

## 2024-08-18 NOTE — Progress Notes (Signed)
 Jeffrey Hodges,  No concerning masses or lesions were seen on your MRI.  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 You did have some narrowing in the blood vessels that feed the intestines.  When severe, this can cause symptoms of abdominal pain after eating as well as other symptoms such as fear of eating, nausea/vomiting and weight loss.   If you are experiencing any of these symptoms, it may be worth getting a CT scan that better assesses these blood vessels.  Please let us  know if you are having any of these symptoms.  We need to repeat an ultrasound of your liver in 6 months to screen for liver cancer.  Alan, please place reminder for repeat RUQUS in 6 months

## 2024-10-18 ENCOUNTER — Encounter: Admitting: Nurse Practitioner
